# Patient Record
Sex: Female | Born: 1945 | Race: White | Hispanic: No | Marital: Married | Smoking: Former smoker
Health system: Southern US, Community
[De-identification: ages and names within clinical notes are randomized; demographics above are authoritative.]

## PROBLEM LIST (undated history)

## (undated) DIAGNOSIS — M069 Rheumatoid arthritis, unspecified: Secondary | ICD-10-CM

## (undated) DIAGNOSIS — M459 Ankylosing spondylitis of unspecified sites in spine: Secondary | ICD-10-CM

## (undated) DIAGNOSIS — K219 Gastro-esophageal reflux disease without esophagitis: Secondary | ICD-10-CM

## (undated) DIAGNOSIS — K5090 Crohn's disease, unspecified, without complications: Secondary | ICD-10-CM

## (undated) DIAGNOSIS — I6523 Occlusion and stenosis of bilateral carotid arteries: Secondary | ICD-10-CM

## (undated) DIAGNOSIS — R112 Nausea with vomiting, unspecified: Secondary | ICD-10-CM

## (undated) DIAGNOSIS — M1990 Unspecified osteoarthritis, unspecified site: Secondary | ICD-10-CM

## (undated) DIAGNOSIS — E039 Hypothyroidism, unspecified: Secondary | ICD-10-CM

## (undated) DIAGNOSIS — J189 Pneumonia, unspecified organism: Secondary | ICD-10-CM

## (undated) DIAGNOSIS — I2510 Atherosclerotic heart disease of native coronary artery without angina pectoris: Secondary | ICD-10-CM

## (undated) DIAGNOSIS — M359 Systemic involvement of connective tissue, unspecified: Secondary | ICD-10-CM

## (undated) DIAGNOSIS — E119 Type 2 diabetes mellitus without complications: Secondary | ICD-10-CM

## (undated) DIAGNOSIS — E785 Hyperlipidemia, unspecified: Secondary | ICD-10-CM

## (undated) DIAGNOSIS — D649 Anemia, unspecified: Secondary | ICD-10-CM

## (undated) DIAGNOSIS — E079 Disorder of thyroid, unspecified: Secondary | ICD-10-CM

## (undated) DIAGNOSIS — Z9889 Other specified postprocedural states: Secondary | ICD-10-CM

## (undated) HISTORY — DX: Atherosclerotic heart disease of native coronary artery without angina pectoris: I25.10

## (undated) HISTORY — PX: BREAST BIOPSY: SHX20

## (undated) HISTORY — PX: TONSILLECTOMY: SUR1361

## (undated) HISTORY — PX: BREAST EXCISIONAL BIOPSY: SUR124

## (undated) HISTORY — PX: JOINT REPLACEMENT: SHX530

## (undated) HISTORY — PX: APPENDECTOMY: SHX54

## (undated) HISTORY — DX: Hyperlipidemia, unspecified: E78.5

## (undated) HISTORY — DX: Disorder of thyroid, unspecified: E07.9

## (undated) HISTORY — DX: Crohn's disease, unspecified, without complications: K50.90

## (undated) HISTORY — DX: Unspecified osteoarthritis, unspecified site: M19.90

---

## 1985-02-19 HISTORY — PX: ABDOMINAL HYSTERECTOMY: SHX81

## 1990-02-19 HISTORY — PX: THYROID SURGERY: SHX805

## 2005-04-09 ENCOUNTER — Ambulatory Visit: Payer: Self-pay | Admitting: Gastroenterology

## 2005-05-14 ENCOUNTER — Ambulatory Visit: Payer: Self-pay | Admitting: Internal Medicine

## 2006-07-11 ENCOUNTER — Ambulatory Visit: Payer: Self-pay

## 2007-07-11 ENCOUNTER — Ambulatory Visit: Payer: Self-pay | Admitting: Otolaryngology

## 2007-07-14 ENCOUNTER — Ambulatory Visit: Payer: Self-pay | Admitting: Obstetrics and Gynecology

## 2007-09-10 ENCOUNTER — Ambulatory Visit: Payer: Self-pay | Admitting: Otolaryngology

## 2008-04-07 ENCOUNTER — Ambulatory Visit: Payer: Self-pay | Admitting: Gastroenterology

## 2008-07-26 ENCOUNTER — Ambulatory Visit: Payer: Self-pay | Admitting: Family Medicine

## 2008-09-13 ENCOUNTER — Emergency Department: Payer: Self-pay | Admitting: Emergency Medicine

## 2008-12-14 ENCOUNTER — Inpatient Hospital Stay: Payer: Self-pay | Admitting: Internal Medicine

## 2009-07-28 ENCOUNTER — Ambulatory Visit: Payer: Self-pay | Admitting: Family Medicine

## 2009-09-13 ENCOUNTER — Inpatient Hospital Stay: Payer: Self-pay | Admitting: Internal Medicine

## 2009-10-25 ENCOUNTER — Ambulatory Visit: Payer: Self-pay | Admitting: Otolaryngology

## 2010-02-19 HISTORY — PX: SMALL INTESTINE SURGERY: SHX150

## 2010-03-25 ENCOUNTER — Emergency Department: Payer: Self-pay | Admitting: Emergency Medicine

## 2010-03-26 ENCOUNTER — Emergency Department: Payer: Self-pay | Admitting: Emergency Medicine

## 2010-08-24 ENCOUNTER — Ambulatory Visit: Payer: Self-pay | Admitting: Internal Medicine

## 2010-11-20 ENCOUNTER — Ambulatory Visit: Payer: Self-pay | Admitting: Gastroenterology

## 2011-02-20 ENCOUNTER — Inpatient Hospital Stay: Payer: Self-pay | Admitting: Internal Medicine

## 2011-02-20 HISTORY — PX: COLONOSCOPY: SHX174

## 2011-02-20 LAB — CBC
HCT: 32.8 % — ABNORMAL LOW (ref 35.0–47.0)
MCH: 28.8 pg (ref 26.0–34.0)
MCH: 29.5 pg (ref 26.0–34.0)
MCHC: 33.8 g/dL (ref 32.0–36.0)
MCV: 86 fL (ref 80–100)
MCV: 87 fL (ref 80–100)
Platelet: 209 10*3/uL (ref 150–440)
Platelet: 167 10*3/uL (ref 150–440)
RDW: 14.6 % — ABNORMAL HIGH (ref 11.5–14.5)
WBC: 9.9 10*3/uL (ref 3.6–11.0)

## 2011-02-20 LAB — SEDIMENTATION RATE: Erythrocyte Sed Rate: 9 mm/hr (ref 0–30)

## 2011-02-20 LAB — COMPREHENSIVE METABOLIC PANEL
Albumin: 2.4 g/dL — ABNORMAL LOW (ref 3.4–5.0)
Alkaline Phosphatase: 63 U/L (ref 50–136)
BUN: 8 mg/dL (ref 7–18)
Bilirubin,Total: 0.3 mg/dL (ref 0.2–1.0)
Creatinine: 0.69 mg/dL (ref 0.60–1.30)
EGFR (Non-African Amer.): >60
Glucose: 131 mg/dL — ABNORMAL HIGH (ref 65–99)
Osmolality: 291 (ref 275–301)
SGPT (ALT): 22 U/L
Sodium: 146 mmol/L — ABNORMAL HIGH (ref 136–145)
Total Protein: 5.6 g/dL — ABNORMAL LOW (ref 6.4–8.2)

## 2011-02-20 LAB — HEMOGLOBIN: HGB: 10.5 g/dL — ABNORMAL LOW (ref 12.0–16.0)

## 2011-02-20 LAB — URINALYSIS, COMPLETE
Leukocyte Esterase: NEGATIVE
Nitrite: NEGATIVE
Ph: 5 (ref 4.5–8.0)
Protein: NEGATIVE
Specific Gravity: 1.008 (ref 1.003–1.030)

## 2011-02-20 LAB — BASIC METABOLIC PANEL
Anion Gap: 9 (ref 7–16)
Calcium, Total: 7.2 mg/dL — ABNORMAL LOW (ref 8.5–10.1)
Chloride: 114 mmol/L — ABNORMAL HIGH (ref 98–107)
Co2: 24 mmol/L (ref 21–32)
Creatinine: 0.65 mg/dL (ref 0.60–1.30)
EGFR (African American): >60
Glucose: 99 mg/dL (ref 65–99)
Potassium: 3.9 mmol/L (ref 3.5–5.1)

## 2011-02-20 LAB — TROPONIN I: Troponin-I: <0.02 ng/mL

## 2011-02-20 LAB — TSH: Thyroid Stimulating Horm: 9.03 u[IU]/mL — ABNORMAL HIGH

## 2011-02-21 LAB — BASIC METABOLIC PANEL
Calcium, Total: 6.9 mg/dL — CL (ref 8.5–10.1)
Chloride: 110 mmol/L — ABNORMAL HIGH (ref 98–107)
EGFR (African American): >60
EGFR (Non-African Amer.): >60
Glucose: 97 mg/dL (ref 65–99)
Potassium: 3.6 mmol/L (ref 3.5–5.1)
Sodium: 144 mmol/L (ref 136–145)

## 2011-02-21 LAB — HEMOGLOBIN: HGB: 9.3 g/dL — ABNORMAL LOW (ref 12.0–16.0)

## 2011-02-21 LAB — CBC WITH DIFFERENTIAL/PLATELET
Basophil #: 0 10*3/uL (ref 0.0–0.1)
Basophil %: 0.6 %
Eosinophil #: 0.1 10*3/uL (ref 0.0–0.7)
HGB: 8.9 g/dL — ABNORMAL LOW (ref 12.0–16.0)
Lymphocyte %: 24.7 %
MCH: 29 pg (ref 26.0–34.0)
MCHC: 33.4 g/dL (ref 32.0–36.0)
MCV: 87 fL (ref 80–100)
Monocyte #: 0.5 10*3/uL (ref 0.0–0.7)
Neutrophil %: 65.2 %
RDW: 14.9 % — ABNORMAL HIGH (ref 11.5–14.5)

## 2011-02-21 LAB — MAGNESIUM
Magnesium: 1.5 mg/dL — ABNORMAL LOW
Magnesium: 1.8 mg/dL

## 2011-03-06 ENCOUNTER — Observation Stay: Payer: Self-pay | Admitting: Internal Medicine

## 2011-03-06 LAB — URINALYSIS, COMPLETE
Bilirubin,UR: NEGATIVE
Blood: NEGATIVE
Leukocyte Esterase: NEGATIVE
Ph: 6 (ref 4.5–8.0)
Protein: NEGATIVE
RBC,UR: <1 /HPF (ref 0–5)
Squamous Epithelial: 2
WBC UR: 1 /HPF (ref 0–5)

## 2011-03-06 LAB — CBC
MCH: 29.3 pg (ref 26.0–34.0)
MCHC: 33.3 g/dL (ref 32.0–36.0)
MCV: 88 fL (ref 80–100)
Platelet: 278 10*3/uL (ref 150–440)
RBC: 3.87 10*6/uL (ref 3.80–5.20)
RDW: 15.4 % — ABNORMAL HIGH (ref 11.5–14.5)

## 2011-03-06 LAB — CK TOTAL AND CKMB (NOT AT ARMC): CK-MB: <0.5 ng/mL — ABNORMAL LOW (ref 0.5–3.6)

## 2011-03-06 LAB — COMPREHENSIVE METABOLIC PANEL
Alkaline Phosphatase: 86 U/L (ref 50–136)
BUN: 8 mg/dL (ref 7–18)
Calcium, Total: 8.3 mg/dL — ABNORMAL LOW (ref 8.5–10.1)
Chloride: 108 mmol/L — ABNORMAL HIGH (ref 98–107)
Co2: 26 mmol/L (ref 21–32)
Creatinine: 0.79 mg/dL (ref 0.60–1.30)
EGFR (African American): >60
EGFR (Non-African Amer.): >60
Sodium: 144 mmol/L (ref 136–145)
Total Protein: 6.8 g/dL (ref 6.4–8.2)

## 2011-03-06 LAB — TROPONIN I
Troponin-I: <0.02 ng/mL
Troponin-I: <0.02 ng/mL

## 2011-03-06 LAB — SEDIMENTATION RATE: Erythrocyte Sed Rate: 33 mm/hr — ABNORMAL HIGH (ref 0–30)

## 2011-03-06 LAB — TSH: Thyroid Stimulating Horm: 2.78 u[IU]/mL

## 2011-03-07 LAB — BASIC METABOLIC PANEL
Anion Gap: 10 (ref 7–16)
BUN: 6 mg/dL — ABNORMAL LOW (ref 7–18)
Co2: 24 mmol/L (ref 21–32)
Creatinine: 0.67 mg/dL (ref 0.60–1.30)
EGFR (African American): >60
EGFR (Non-African Amer.): >60

## 2011-03-07 LAB — CBC WITH DIFFERENTIAL/PLATELET
Basophil #: 0 10*3/uL (ref 0.0–0.1)
Basophil %: 0.5 %
Eosinophil #: 0.1 10*3/uL (ref 0.0–0.7)
HCT: 28.3 % — ABNORMAL LOW (ref 35.0–47.0)
HGB: 9.5 g/dL — ABNORMAL LOW (ref 12.0–16.0)
Lymphocyte #: 1.4 10*3/uL (ref 1.0–3.6)
Lymphocyte %: 23.4 %
MCHC: 33.5 g/dL (ref 32.0–36.0)
MCV: 88 fL (ref 80–100)
Monocyte #: 0.5 10*3/uL (ref 0.0–0.7)
Monocyte %: 8.7 %
Neutrophil #: 3.9 10*3/uL (ref 1.4–6.5)
RBC: 3.2 10*6/uL — ABNORMAL LOW (ref 3.80–5.20)
WBC: 5.9 10*3/uL (ref 3.6–11.0)

## 2011-03-09 ENCOUNTER — Ambulatory Visit: Payer: Self-pay | Admitting: General Surgery

## 2011-03-09 LAB — CBC WITH DIFFERENTIAL/PLATELET
Eosinophil #: 0.1 10*3/uL (ref 0.0–0.7)
Lymphocyte #: 1.8 10*3/uL (ref 1.0–3.6)
MCH: 29.4 pg (ref 26.0–34.0)
MCHC: 33.4 g/dL (ref 32.0–36.0)
Monocyte #: 0.5 10*3/uL (ref 0.0–0.7)
Monocyte %: 7.5 %
Neutrophil %: 64.7 %
Platelet: 220 10*3/uL (ref 150–440)
RBC: 3.65 10*6/uL — ABNORMAL LOW (ref 3.80–5.20)
RDW: 15 % — ABNORMAL HIGH (ref 11.5–14.5)
WBC: 6.7 10*3/uL (ref 3.6–11.0)

## 2011-03-09 LAB — SEDIMENTATION RATE: Erythrocyte Sed Rate: 34 mm/hr — ABNORMAL HIGH (ref 0–30)

## 2011-03-13 LAB — PATHOLOGY REPORT

## 2011-03-15 ENCOUNTER — Ambulatory Visit: Payer: Self-pay | Admitting: General Surgery

## 2011-03-26 ENCOUNTER — Ambulatory Visit: Payer: Self-pay | Admitting: General Surgery

## 2011-04-02 DIAGNOSIS — D649 Anemia, unspecified: Secondary | ICD-10-CM | POA: Insufficient documentation

## 2011-05-02 DIAGNOSIS — R350 Frequency of micturition: Secondary | ICD-10-CM | POA: Insufficient documentation

## 2011-05-02 DIAGNOSIS — Z7960 Long term (current) use of unspecified immunomodulators and immunosuppressants: Secondary | ICD-10-CM | POA: Insufficient documentation

## 2011-05-02 DIAGNOSIS — K6389 Other specified diseases of intestine: Secondary | ICD-10-CM | POA: Insufficient documentation

## 2011-06-01 ENCOUNTER — Ambulatory Visit: Payer: Self-pay | Admitting: General Surgery

## 2011-06-06 ENCOUNTER — Inpatient Hospital Stay: Payer: Self-pay | Admitting: General Surgery

## 2011-06-06 HISTORY — PX: COLON SURGERY: SHX602

## 2011-06-07 LAB — CREATININE, SERUM: Creatinine: 0.82 mg/dL (ref 0.60–1.30)

## 2011-06-08 LAB — BASIC METABOLIC PANEL
BUN: 4 mg/dL — ABNORMAL LOW (ref 7–18)
Calcium, Total: 7.2 mg/dL — ABNORMAL LOW (ref 8.5–10.1)
Chloride: 104 mmol/L (ref 98–107)
Co2: 29 mmol/L (ref 21–32)
EGFR (African American): >60
EGFR (Non-African Amer.): >60
Sodium: 141 mmol/L (ref 136–145)

## 2011-06-08 LAB — CBC WITH DIFFERENTIAL/PLATELET
Basophil #: 0 10*3/uL (ref 0.0–0.1)
Eosinophil #: 0.3 10*3/uL (ref 0.0–0.7)
Eosinophil %: 4.4 %
HCT: 27.4 % — ABNORMAL LOW (ref 35.0–47.0)
HGB: 9 g/dL — ABNORMAL LOW (ref 12.0–16.0)
Lymphocyte #: 1.5 10*3/uL (ref 1.0–3.6)
Lymphocyte %: 18.9 %
MCH: 27.5 pg (ref 26.0–34.0)
MCHC: 32.8 g/dL (ref 32.0–36.0)
MCV: 84 fL (ref 80–100)
Neutrophil #: 5.1 10*3/uL (ref 1.4–6.5)
Neutrophil %: 66.3 %
Platelet: 158 10*3/uL (ref 150–440)
WBC: 7.8 10*3/uL (ref 3.6–11.0)

## 2011-06-28 ENCOUNTER — Ambulatory Visit: Payer: Self-pay

## 2011-07-03 LAB — PATHOLOGY REPORT

## 2011-09-03 DIAGNOSIS — Z79899 Other long term (current) drug therapy: Secondary | ICD-10-CM | POA: Insufficient documentation

## 2011-09-19 DIAGNOSIS — L989 Disorder of the skin and subcutaneous tissue, unspecified: Secondary | ICD-10-CM | POA: Insufficient documentation

## 2011-10-17 ENCOUNTER — Ambulatory Visit: Payer: Self-pay | Admitting: Nurse Practitioner

## 2011-11-30 ENCOUNTER — Encounter: Payer: Self-pay | Admitting: Rheumatology

## 2011-12-21 ENCOUNTER — Encounter: Payer: Self-pay | Admitting: Rheumatology

## 2012-03-11 DIAGNOSIS — R002 Palpitations: Secondary | ICD-10-CM | POA: Insufficient documentation

## 2012-04-15 DIAGNOSIS — H18599 Other hereditary corneal dystrophies, unspecified eye: Secondary | ICD-10-CM | POA: Insufficient documentation

## 2013-03-16 ENCOUNTER — Ambulatory Visit: Payer: Self-pay | Admitting: Nurse Practitioner

## 2013-08-31 DIAGNOSIS — K9089 Other intestinal malabsorption: Secondary | ICD-10-CM | POA: Insufficient documentation

## 2013-08-31 DIAGNOSIS — E538 Deficiency of other specified B group vitamins: Secondary | ICD-10-CM | POA: Insufficient documentation

## 2013-12-02 ENCOUNTER — Encounter: Payer: Self-pay | Admitting: *Deleted

## 2013-12-10 ENCOUNTER — Ambulatory Visit: Payer: Self-pay | Admitting: Otolaryngology

## 2013-12-10 LAB — CREATININE, SERUM
Creatinine: 0.92 mg/dL (ref 0.60–1.30)
EGFR (Non-African Amer.): >60

## 2013-12-15 ENCOUNTER — Encounter: Payer: Self-pay | Admitting: General Surgery

## 2013-12-16 ENCOUNTER — Encounter: Payer: Self-pay | Admitting: General Surgery

## 2013-12-16 ENCOUNTER — Ambulatory Visit (INDEPENDENT_AMBULATORY_CARE_PROVIDER_SITE_OTHER): Payer: Federal, State, Local not specified - PPO | Admitting: General Surgery

## 2013-12-16 VITALS — BP 130/74 | HR 68 | Resp 14 | Ht 68.0 in | Wt 173.0 lb

## 2013-12-16 DIAGNOSIS — K439 Ventral hernia without obstruction or gangrene: Secondary | ICD-10-CM

## 2013-12-16 DIAGNOSIS — K5090 Crohn's disease, unspecified, without complications: Secondary | ICD-10-CM

## 2013-12-16 NOTE — Patient Instructions (Addendum)
Patient to have a abdominal cat scan. Follow up appointment to be announced.  This patient has been scheduled for a CT abdomen/pelvis with IV and oral contrast at [facility] for Monday, 12-21-13 at 10:30 am (arrive 10:15 am). Prep: no solids 4 hours prior but patient may have clear liquids up until exam time, pick up prep kit, and take medication list. Patient verbalizes understanding.

## 2013-12-16 NOTE — Progress Notes (Signed)
Patient ID: Unk Pinto, female   DOB: 06-14-1945, 68 y.o.   MRN: [id number]  Chief Complaint  Patient presents with  . Other    ventral hernia    HPI Joanna Ortiz is a 68 y.o. female here today for a evaluation of a possible ventral  hernia. She states she noticed it two months ago. Patient states she feel pressure but no pain.  She has been aware of a diffuse bulge in the upper abdomen with changes in position for the last 6-8 months.     HPI  Past Medical History  Diagnosis Date  . Crohn disease   . Thyroid disease     Past Surgical History  Procedure Laterality Date  . Abdominal hysterectomy  1987  . Thyroid surgery  1992  . Small intestine surgery  2012  . Colonoscopy  2013  . Colon surgery  06/06/2011    Resection of terminal ileum and previous ileocolonic anastomosis for recurrent Crohn's disease. 3 focal strictures identified.    No family history on file.  Social History History  Substance Use Topics  . Smoking status: Former Smoker -- 1.00 packs/day for 12 years    Types: Cigarettes  . Smokeless tobacco: Not on file  . Alcohol Use: No    Allergies  Allergen Reactions  . Cefuroxime Nausea And Vomiting  . Ciprocinonide [Fluocinolone]     Muscle pain   . Floxin [Ofloxacin] Nausea And Vomiting  . Keflex [Cephalexin] Nausea And Vomiting  . Levofloxacin Nausea And Vomiting  . Sulfa Antibiotics Nausea And Vomiting    Current Outpatient Prescriptions  Medication Sig Dispense Refill  . B Complex Vitamins (B-COMPLEX/B-12 PO) Take by mouth.      . Cinnamon 500 MG TABS Take by mouth daily.      . colestipol (COLESTID) 1 G tablet Take 1 g by mouth as needed.      Marland Kitchen HUMIRA PEN 40 MG/0.8ML PNKT       . Multiple Vitamin (MULTIVITAMIN) capsule Take by mouth.      . Omega-3 Fatty Acids (FISH OIL) 1000 MG CAPS Take by mouth daily.      Marland Kitchen thyroid (ARMOUR THYROID) 15 MG tablet Take 0.5 mg by mouth 4 (four) times daily.        No current  facility-administered medications for this visit.    Review of Systems Review of Systems  Constitutional: Negative.   Respiratory: Negative.   Cardiovascular: Negative.     Blood pressure 130/74, pulse 68, resp. rate 14, height $RemoveBe'5\' 8"'mREXqpHDm$  (1.727 m), weight 173 lb (78.472 kg). The patient's weight is up 10 pounds from her May 2013 exam. Physical Exam Physical Exam  Constitutional: She is oriented to person, place, and time. She appears well-developed and well-nourished.  Eyes: Conjunctivae are normal. No scleral icterus.  Neck: Neck supple.  Cardiovascular: Normal rate, regular rhythm and normal heart sounds.   Pulmonary/Chest: Effort normal and breath sounds normal.  Abdominal: Soft. Normal appearance and bowel sounds are normal. There is no hepatomegaly. There is tenderness in the epigastric area. Hernia:  4 inch above belly buttton midleine defect   Neurological: She is alert and oriented to person, place, and time.  Skin: Skin is warm and dry.    Data Reviewed Pathology from the 06/06/2011 intestinal resection showed terminal ileum and right colon with chronic active colitis with features consistent with Crohn's disease. Margins were clear.  Assessment    Possible ventral hernia.    Plan  Patient to have a CT scan based bar to evaluate the abdominal wall.   This patient has been scheduled for a CT abdomen/pelvis with IV and oral contrast at [facility] for Monday, 12-21-13 at 10:30 am (arrive 10:15 am). Prep: no solids 4 hours prior but patient may have clear liquids up until exam time, pick up prep kit, and take medication list. Patient verbalizes understanding.  PCP . Jason Fila 12/18/2013, 11:39 AM

## 2013-12-18 ENCOUNTER — Encounter: Payer: Self-pay | Admitting: General Surgery

## 2013-12-18 DIAGNOSIS — K5090 Crohn's disease, unspecified, without complications: Secondary | ICD-10-CM | POA: Insufficient documentation

## 2013-12-18 DIAGNOSIS — K439 Ventral hernia without obstruction or gangrene: Secondary | ICD-10-CM | POA: Insufficient documentation

## 2013-12-22 ENCOUNTER — Encounter: Payer: Self-pay | Admitting: General Surgery

## 2013-12-22 ENCOUNTER — Ambulatory Visit: Payer: Self-pay | Admitting: General Surgery

## 2013-12-24 ENCOUNTER — Telehealth: Payer: Self-pay | Admitting: *Deleted

## 2013-12-24 NOTE — Telephone Encounter (Signed)
Notified patient as instructed, patient pleased. Discussed follow-up appointments, patient agrees  

## 2013-12-24 NOTE — Telephone Encounter (Signed)
-----   Message from [name], MD sent at 12/24/2013  2:36 PM EST ----- Please notify the patient that NO hernias identified.  We may just be dealing with weakened tissue. No need for surgery at this time. Arrange f/u appt to review results. Thanks.

## 2013-12-29 ENCOUNTER — Encounter: Payer: Self-pay | Admitting: General Surgery

## 2013-12-29 ENCOUNTER — Ambulatory Visit (INDEPENDENT_AMBULATORY_CARE_PROVIDER_SITE_OTHER): Payer: Federal, State, Local not specified - PPO | Admitting: General Surgery

## 2013-12-29 VITALS — BP 140/78 | HR 72 | Resp 12 | Ht 68.0 in | Wt 175.0 lb

## 2013-12-29 DIAGNOSIS — R109 Unspecified abdominal pain: Secondary | ICD-10-CM

## 2013-12-29 NOTE — Progress Notes (Signed)
Patient ID: Joanna Ortiz, female   DOB: [birth year], 68 y.o.   MRN: [id number]  Chief Complaint  Patient presents with  . Follow-up    CT scan    HPI Joanna Ortiz is a 68 y.o. female.  Here today to review CT scan results done 12-22-13. She states her last episode of pain was a couple of days ago after her meal. She states it lasted 1-2 hours associated with diarrhea. Occurs every couple of days not associated with any particular food.  Plan for tonsillectomy 12-31-13 by Dr Kathyrn Sheriff.  HPI  Past Medical History  Diagnosis Date  . Crohn disease   . Thyroid disease     Past Surgical History  Procedure Laterality Date  . Abdominal hysterectomy  1987  . Thyroid surgery  1992  . Small intestine surgery  2012  . Colonoscopy  2013  . Colon surgery  06/06/2011    Resection of terminal ileum and previous ileocolonic anastomosis for recurrent Crohn's disease. 3 focal strictures identified.    No family history on file.  Social History History  Substance Use Topics  . Smoking status: Former Smoker -- 1.00 packs/day for 12 years    Types: Cigarettes  . Smokeless tobacco: Not on file  . Alcohol Use: No    Allergies  Allergen Reactions  . Cefuroxime Nausea And Vomiting  . Ciprocinonide [Fluocinolone]     Muscle pain   . Floxin [Ofloxacin] Nausea And Vomiting  . Keflex [Cephalexin] Nausea And Vomiting  . Levofloxacin Nausea And Vomiting  . Sulfa Antibiotics Nausea And Vomiting    Current Outpatient Prescriptions  Medication Sig Dispense Refill  . B Complex Vitamins (B-COMPLEX/B-12 PO) Take by mouth.    . colestipol (COLESTID) 1 G tablet Take 1 g by mouth as needed.    Marland Kitchen HUMIRA PEN 40 MG/0.8ML PNKT     . Multiple Vitamin (MULTIVITAMIN) capsule Take by mouth.    . thyroid (ARMOUR THYROID) 15 MG tablet Take 0.5 mg by mouth 4 (four) times daily.     . Cinnamon 500 MG TABS Take by mouth daily.    . Omega-3 Fatty Acids (FISH OIL) 1000 MG CAPS Take by mouth daily.     No  current facility-administered medications for this visit.    Review of Systems Review of Systems  Constitutional: Negative.   Respiratory: Negative.   Cardiovascular: Negative.   Gastrointestinal: Positive for abdominal pain and diarrhea. Negative for nausea and vomiting.       Loose bowel movements postprandial, unchanged from initial resection of the terminal ileum in the late 1990s.    Blood pressure 140/78, pulse 72, resp. rate 12, height 5\' 8"  (1.727 m), weight 175 lb (79.379 kg).  Physical Exam Physical Exam  Constitutional: She is oriented to person, place, and time. She appears well-developed and well-nourished.  Neck: Neck supple.  Abdominal: Soft. Normal appearance. There is no tenderness. No hernia.  Lymphadenopathy:    She has no cervical adenopathy.  Neurological: She is alert and oriented to person, place, and time.  Skin: Skin is warm and dry.    Data Reviewed CT scan of the abdomen and pelvis dated 12/22/2013 did not show evidence of a ventral hernia.  Assessment    Intermittent abdominal pain, likely secondary to extensive diverticulosis of the small bowel, in particular the duodenum. No evidence of obstruction on recent CT.     Plan    The gallstone identified on CT does not appear to be obstructing, and  there is no evidence of gallbladder wall thickening. Her symptoms are more suggestive of intermittent overdistention and emptying of the known proximal duodenal diverticuli, the largest previously measuring 5 cm in diameter. Surgical mention of these lesions would likely be fraught with danger. Her symptoms are occurring once or twice a week, and at this time she is not appreciated any particular dietary pattern. She's been encouraged to pay close attention to this in the event that certain foods are more troubling than others. Unless her symptoms worsen would advise against elective cholecystectomy at this time. No CT evidence of abdominal wall hernia present.  Observation is warranted.      Follow for now unless symptoms occur more frequent or changes. Consider logging a food diary.  PCP:  Thereasa Distance [facility]: Dr Boneta Lucks, [facility]  Joanna Ortiz 12/29/2013, 8:31 PM

## 2013-12-29 NOTE — Patient Instructions (Addendum)
The patient is aware to call back for any questions or concerns. Follow for now unless symptoms occur more frequent or changes. Consider logging a food diary.

## 2013-12-31 ENCOUNTER — Ambulatory Visit: Payer: Self-pay | Admitting: Otolaryngology

## 2014-01-25 ENCOUNTER — Ambulatory Visit: Payer: Self-pay | Admitting: Otolaryngology

## 2014-01-25 LAB — T4, FREE: FREE THYROXINE: 0.79 ng/dL (ref 0.76–1.46)

## 2014-01-25 LAB — TSH: THYROID STIMULATING HORM: 0.174 u[IU]/mL — AB

## 2014-04-02 DIAGNOSIS — E781 Pure hyperglyceridemia: Secondary | ICD-10-CM | POA: Insufficient documentation

## 2014-04-02 DIAGNOSIS — E039 Hypothyroidism, unspecified: Secondary | ICD-10-CM | POA: Insufficient documentation

## 2014-04-21 ENCOUNTER — Ambulatory Visit: Payer: Self-pay | Admitting: Otolaryngology

## 2014-06-04 ENCOUNTER — Inpatient Hospital Stay
Admit: 2014-06-04 | Disposition: A | Discharge status: Home / Self Care | Payer: Self-pay | Attending: Internal Medicine | Admitting: Internal Medicine

## 2014-06-04 LAB — CBC WITH DIFFERENTIAL/PLATELET
BASOS ABS: 0.1 10*3/uL (ref 0.0–0.1)
BASOS PCT: 0.5 %
Eosinophil #: 0.2 10*3/uL (ref 0.0–0.7)
Eosinophil %: 2.5 %
HCT: 37.8 % (ref 35.0–47.0)
HGB: 12.7 g/dL (ref 12.0–16.0)
LYMPHS ABS: 3.1 10*3/uL (ref 1.0–3.6)
Lymphocyte %: 31.8 %
MCH: 29.6 pg (ref 26.0–34.0)
MCHC: 33.6 g/dL (ref 32.0–36.0)
MCV: 88 fL (ref 80–100)
Monocyte #: 0.6 x10 3/mm (ref 0.2–0.9)
Monocyte %: 6.4 %
NEUTROS ABS: 5.6 10*3/uL (ref 1.4–6.5)
Neutrophil %: 58.8 %
Platelet: 213 10*3/uL (ref 150–440)
RBC: 4.29 10*6/uL (ref 3.80–5.20)
RDW: 14.2 % (ref 11.5–14.5)
WBC: 9.6 10*3/uL (ref 3.6–11.0)

## 2014-06-04 LAB — COMPREHENSIVE METABOLIC PANEL
ANION GAP: 9 (ref 7–16)
AST: 24 U/L
Albumin: 3.8 g/dL
Alkaline Phosphatase: 91 U/L
BUN: 10 mg/dL
Bilirubin,Total: 0.5 mg/dL
CO2: 26 mmol/L
Calcium, Total: 9 mg/dL
Chloride: 107 mmol/L
Creatinine: 0.77 mg/dL
EGFR (Non-African Amer.): >60
GLUCOSE: 205 mg/dL — AB
Potassium: 3.8 mmol/L
SGPT (ALT): 31 U/L
SODIUM: 142 mmol/L
Total Protein: 7.3 g/dL

## 2014-06-05 LAB — CBC WITH DIFFERENTIAL/PLATELET
BASOS PCT: 0.4 %
Basophil #: 0 10*3/uL (ref 0.0–0.1)
Eosinophil #: 0.3 10*3/uL (ref 0.0–0.7)
Eosinophil %: 2.9 %
HCT: 33.8 % — ABNORMAL LOW (ref 35.0–47.0)
HGB: 11.5 g/dL — AB (ref 12.0–16.0)
LYMPHS PCT: 22.6 %
Lymphocyte #: 2 10*3/uL (ref 1.0–3.6)
MCH: 30.1 pg (ref 26.0–34.0)
MCHC: 34.1 g/dL (ref 32.0–36.0)
MCV: 88 fL (ref 80–100)
Monocyte #: 0.7 x10 3/mm (ref 0.2–0.9)
Monocyte %: 8 %
NEUTROS PCT: 66.1 %
Neutrophil #: 5.9 10*3/uL (ref 1.4–6.5)
PLATELETS: 180 10*3/uL (ref 150–440)
RBC: 3.83 10*6/uL (ref 3.80–5.20)
RDW: 14.1 % (ref 11.5–14.5)
WBC: 9 10*3/uL (ref 3.6–11.0)

## 2014-06-05 LAB — MAGNESIUM: MAGNESIUM: 1.4 mg/dL — AB

## 2014-06-05 LAB — BASIC METABOLIC PANEL
Anion Gap: 6 — ABNORMAL LOW (ref 7–16)
BUN: 10 mg/dL
CALCIUM: 8.2 mg/dL — AB
CO2: 25 mmol/L
CREATININE: 0.8 mg/dL
Chloride: 111 mmol/L
EGFR (African American): >60
Glucose: 123 mg/dL — ABNORMAL HIGH
Potassium: 3.3 mmol/L — ABNORMAL LOW
Sodium: 142 mmol/L

## 2014-06-05 LAB — HEMOGLOBIN: HGB: 12.2 g/dL (ref 12.0–16.0)

## 2014-06-06 LAB — BASIC METABOLIC PANEL
ANION GAP: 5 — AB (ref 7–16)
BUN: 7 mg/dL
CALCIUM: 7.6 mg/dL — AB
Chloride: 111 mmol/L
Co2: 26 mmol/L
Creatinine: 0.65 mg/dL
EGFR (African American): >60
EGFR (Non-African Amer.): >60
Glucose: 116 mg/dL — ABNORMAL HIGH
POTASSIUM: 3.5 mmol/L
SODIUM: 142 mmol/L

## 2014-06-06 LAB — CBC WITH DIFFERENTIAL/PLATELET
Basophil #: 0 10*3/uL (ref 0.0–0.1)
Basophil %: 0.3 %
EOS PCT: 1.3 %
Eosinophil #: 0.1 10*3/uL (ref 0.0–0.7)
HCT: 32.5 % — AB (ref 35.0–47.0)
HGB: 10.8 g/dL — AB (ref 12.0–16.0)
LYMPHS ABS: 1.9 10*3/uL (ref 1.0–3.6)
Lymphocyte %: 21.3 %
MCH: 29.6 pg (ref 26.0–34.0)
MCHC: 33.3 g/dL (ref 32.0–36.0)
MCV: 89 fL (ref 80–100)
MONOS PCT: 7.6 %
Monocyte #: 0.7 x10 3/mm (ref 0.2–0.9)
Neutrophil #: 6.2 10*3/uL (ref 1.4–6.5)
Neutrophil %: 69.5 %
Platelet: 154 10*3/uL (ref 150–440)
RBC: 3.67 10*6/uL — ABNORMAL LOW (ref 3.80–5.20)
RDW: 13.8 % (ref 11.5–14.5)
WBC: 8.9 10*3/uL (ref 3.6–11.0)

## 2014-06-07 ENCOUNTER — Telehealth: Payer: Self-pay | Admitting: *Deleted

## 2014-06-07 ENCOUNTER — Encounter: Payer: Self-pay | Admitting: General Surgery

## 2014-06-07 NOTE — Telephone Encounter (Signed)
-----   Message from [name], MD sent at 06/06/2014  9:49 AM EDT ----- Patient admitted Friday w/ GI bleed, d/c'd home on Sunday, April 17. Arrange for OV on Thursday,  and post for colonoscopy on Tuesday, April 26th. Thanks.

## 2014-06-07 NOTE — Telephone Encounter (Signed)
Appointment and colonoscopy scheduled accordingly. Patient aware.   Will review colonoscopy prep instructions at time of appointment on Thursday, June 10, 2014.

## 2014-06-10 ENCOUNTER — Ambulatory Visit (INDEPENDENT_AMBULATORY_CARE_PROVIDER_SITE_OTHER): Payer: Federal, State, Local not specified - PPO | Admitting: General Surgery

## 2014-06-10 ENCOUNTER — Other Ambulatory Visit: Payer: Self-pay | Admitting: General Surgery

## 2014-06-10 ENCOUNTER — Encounter: Payer: Self-pay | Admitting: General Surgery

## 2014-06-10 VITALS — BP 132/68 | HR 74 | Resp 14 | Ht 68.0 in | Wt 181.0 lb

## 2014-06-10 DIAGNOSIS — R1310 Dysphagia, unspecified: Secondary | ICD-10-CM

## 2014-06-10 DIAGNOSIS — K625 Hemorrhage of anus and rectum: Secondary | ICD-10-CM | POA: Diagnosis not present

## 2014-06-10 MED ORDER — POLYETHYLENE GLYCOL 3350 17 GM/SCOOP PO POWD: ORAL | Status: DC

## 2014-06-10 NOTE — Patient Instructions (Addendum)
Colonoscopy A colonoscopy is an exam to look at the entire large intestine (colon). This exam can help find problems such as tumors, polyps, inflammation, and areas of bleeding. The exam takes about 1 hour.  LET [facility] CARE PROVIDER KNOW ABOUT:   Any allergies you have.  All medicines you are taking, including vitamins, herbs, eye drops, creams, and over-the-counter medicines.  Previous problems you or members of your family have had with the use of anesthetics.  Any blood disorders you have.  Previous surgeries you have had.  Medical conditions you have. RISKS AND COMPLICATIONS  Generally, this is a safe procedure. However, as with any procedure, complications can occur. Possible complications include:  Bleeding.  Tearing or rupture of the colon wall.  Reaction to medicines given during the exam.  Infection (rare). BEFORE THE PROCEDURE   Ask your health care provider about changing or stopping your regular medicines.  You may be prescribed an oral bowel prep. This involves drinking a large amount of medicated liquid, starting the day before your procedure. The liquid will cause you to have multiple loose stools until your stool is almost clear or light green. This cleans out your colon in preparation for the procedure.  Do not eat or drink anything else once you have started the bowel prep, unless your health care provider tells you it is safe to do so.  Arrange for someone to drive you home after the procedure. PROCEDURE   You will be given medicine to help you relax (sedative).  You will lie on your side with your knees bent.  A long, flexible tube with a light and camera on the end (colonoscope) will be inserted through the rectum and into the colon. The camera sends video back to a computer screen as it moves through the colon. The colonoscope also releases carbon dioxide gas to inflate the colon. This helps your health care provider see the area better.  During  the exam, your health care provider may take a small tissue sample (biopsy) to be examined under a microscope if any abnormalities are found.  The exam is finished when the entire colon has been viewed. AFTER THE PROCEDURE   Do not drive for 24 hours after the exam.  You may have a small amount of blood in your stool.  You may pass moderate amounts of gas and have mild abdominal cramping or bloating. This is caused by the gas used to inflate your colon during the exam.  Ask when your test results will be ready and how you will get your results. Make sure you get your test results. Document Released: 02/03/2000 Document Revised: 11/26/2012 Document Reviewed: 10/13/2012 [facility] Patient Information 2015 [place], [place]. This information is not intended to replace advice given to you by your health care provider. Make sure you discuss any questions you have with your health care provider.  Patient has been scheduled for an upper endoscopy and colonoscopy on 06-15-14 at [facility].

## 2014-06-10 NOTE — Progress Notes (Signed)
Patient ID: Unk Pinto, female   DOB: [birth year], 69 y.o.   MRN: [id number]  Chief Complaint  Patient presents with  . Pre-op Exam    colonoscopy    HPI Joanna Ortiz is a 69 y.o. female here today in follow-up of her recent hospitalization April 15-17, 2016 with the passage of blood per rectum. Patient was seen in the ER on 06/04/14 with a GI bleed. Her hemoglobin on admission was 12.7 with an MCV of 88, falling to 10.8 prior to discharge. White blood cell count was normal as was her platelet count. The bleeding was similar but less severe than she appreciated in 2013 when she had a similar episode. Colonoscopy at that time was negative. She was found to have 2 new areas of Crohn's stricture with a subsequent resection of 45 cm of the distal small bowel Henry anastomosis. The patient reports 7-8 loose stools per day, no significant improvement after the initiation of Humira in 2013. The patient reports that when she traveled home to [place] last year she was off of her medication for a month (usually taken twice per month)  with no change in her symptoms or stool frequency.    She states she has been having trouble swallowing over a year now. This is most notable with meats and carrots. She does not regurgitate food, but does have to wait until it passes. The sensation where the food appears to "stick" is at the bottom of the sternum. She had a similar episode in 2004 that responded well to dilatation completed by Gaylyn Cheers, M.D.      HPI  Past Medical History  Diagnosis Date  . Crohn disease   . Thyroid disease     Past Surgical History  Procedure Laterality Date  . Abdominal hysterectomy  1987  . Thyroid surgery  1992  . Small intestine surgery  2012  . Colonoscopy  2013  . Colon surgery  06/06/2011    Resection of terminal ileum and previous ileocolonic anastomosis for recurrent Crohn's disease. 3 focal strictures identified.    No family history on  file.  Social History History  Substance Use Topics  . Smoking status: Former Smoker -- 1.00 packs/day for 12 years    Types: Cigarettes  . Smokeless tobacco: Not on file  . Alcohol Use: No    Allergies  Allergen Reactions  . Cefuroxime Nausea And Vomiting  . Ciprocinonide [Fluocinolone]     Muscle pain   . Floxin [Ofloxacin] Nausea And Vomiting  . Keflex [Cephalexin] Nausea And Vomiting  . Levofloxacin Nausea And Vomiting  . Sulfa Antibiotics Nausea And Vomiting    Current Outpatient Prescriptions  Medication Sig Dispense Refill  . B Complex Vitamins (B-COMPLEX/B-12 PO) Take by mouth.    . colestipol (COLESTID) 1 G tablet Take 1 g by mouth as needed.    Marland Kitchen HUMIRA PEN 40 MG/0.8ML PNKT     . Multiple Vitamin (MULTIVITAMIN) capsule Take by mouth.    . Omega-3 Fatty Acids (FISH OIL) 1000 MG CAPS Take by mouth daily.    Marland Kitchen SYNTHROID 100 MCG tablet Take 100 mcg by mouth daily before breakfast.   11  . thyroid (ARMOUR THYROID) 15 MG tablet Take 0.5 mg by mouth 4 (four) times daily.     . polyethylene glycol powder (GLYCOLAX/MIRALAX) powder 255 grams one bottle for colonoscopy prep 255 g 0   No current facility-administered medications for this visit.    Review of Systems Review of Systems  Constitutional: Negative.   Respiratory: Negative.   Cardiovascular: Negative.   Gastrointestinal: Positive for nausea and diarrhea. Negative for vomiting, abdominal pain, constipation, blood in stool, abdominal distention, anal bleeding and rectal pain.    Blood pressure 132/68, pulse 74, resp. rate 14, height 5\' 8"  (1.727 m), weight 181 lb (82.101 kg).  The patient's weight is up 8 pounds from her October 2015 exam.  Physical Exam Physical Exam  Constitutional: She is oriented to person, place, and time. She appears well-developed and well-nourished.  Eyes: Conjunctivae are normal. No scleral icterus.  Neck: Neck supple.  Cardiovascular: Normal rate, regular rhythm and normal heart  sounds.   Pulmonary/Chest: Effort normal and breath sounds normal.  Abdominal: Soft. Normal appearance and bowel sounds are normal. There is no hepatomegaly. There is no tenderness. No hernia.  Lymphadenopathy:    She has no cervical adenopathy.  Neurological: She is alert and oriented to person, place, and time.  Skin: Skin is warm and dry.    Data Reviewed   CT scan of the abdomen and pelvis dated 12/22/2013 did not show evidence of a ventral hernia.  Pathology of the 06/06/2011 resection of the terminal ileum and proximal right colon showed active colitis, transmural inflammation and small granuloma. Margins were clear. 5 benign lymph nodes.  Assessment    Recent GI bleed, likely diverticular source relevant Crohn's disease.  Marginal benefit from Humira.    Plan      Patient is scheduled for a colonoscopy and upper endoscopy on 06/15/14/  Colonoscopy with possible biopsy/polypectomy prn: Information regarding the procedure, including its potential risks and complications (including but not limited to perforation of the bowel, which may require emergency surgery to repair, and bleeding) was verbally given to the patient. Educational information regarding lower instestinal endoscopy was given to the patient. Written instructions for how to complete the bowel prep using Miralax were provided. The importance of drinking ample fluids to avoid dehydration as a result of the prep emphasized.  The risks associated with dilatation including possible rupture of the esophagus were reviewed with the patient and her daughter, Venetia Night.  The patient has discussed the lack of improvement in stool frequency with the GI department at [facility] in the past. She has found it beneficial to make use of cholestyramine, but found the capsules too large. She was instructed that these may be crushed, or we can change her to cholestyramine powder.  Patient has been scheduled for an upper endoscopy and  colonoscopy on 06-15-14 at [facility].    PCP: Thereasa Distance [facility]: Dr Boneta Lucks, [facility]  [name] 06/10/2014, 9:00 PM

## 2014-06-13 NOTE — H&P (Signed)
PATIENT NAME:  Joanna Ortiz, Joanna Ortiz MR#:  [id number] DATE OF BIRTH:  [birth year]  DATE OF ADMISSION:  02/20/2011  PRIMARY CARE PHYSICIAN: Dr. Ellison Hughs at [place]: Bright red blood per rectum.   HISTORY OF PRESENT ILLNESS: This is a 69 year old female who has a history of Crohn's disease. Last few days she has been experiencing some cramping abdominal pain. She has had some diarrhea with bits of blood in it. No nausea and vomiting. Last night she started passing copious amounts of blood. This morning she passed out in the bathroom. Her husband said there was a small puddle of blood by her and she kept passing blood. He called EMT. She came to but her blood pressure was low. Here in the ER she was hypotensive with blood pressure in the low 90s. Hemoglobin had fallen since previous readings. She was started on blood transfusion. She is able to answer questions but appears acutely ill and weak.   PAST MEDICAL HISTORY:  1. Crohn's disease.  2. Osteoarthritis. a. Cervical disk disease. b. Thoracic degenerative disk disease.  3. Ankylosing spondylitis.  4. Hypothyroidism.  5. Pernicious anemia.  6. Hypertriglyceridemia.  7. Restless leg syndrome.  8. Hypocalcemia.  9. Hypomagnesemia.  10. Lymphedema.   PAST SURGICAL HISTORY:  1. Thyroidectomy. 2. Hysterectomy.  3. Bowel obstruction.  4. Breast excision.   ALLERGIES: Cipro, Entocort and sulfa.   CURRENT MEDICATIONS:  1. HUMARA every two weeks. 2. Ibuprofen 400 mg at bedtime p.r.n.  3. Budesonide 3 mg daily.  4. Armour thyroid 30 mg 2 tabs b.i.d.  5. Cholestyramine 4 grams daily.  6. Tramadol 50 mg every six hours p.r.n.  7. Magnesium oxide 400 mg b.i.d.  8. Prilosec 40 mg daily.  9. Percocet 5/325 q.4 hours p.r.n.   SOCIAL HISTORY: Does not smoke, does not drink alcohol.   FAMILY HISTORY: Significant for coronary artery disease and breast cancer.   REVIEW OF SYSTEMS: CONSTITUTIONAL: No fever or chills.  EYES: No blurred vision. ENT: No hearing loss. CARDIOVASCULAR: No chest pain. PULMONARY: No shortness of breath. GASTROINTESTINAL: She has had diarrhea and bright red blood per rectum. GENITOURINARY: No dysuria. ENDOCRINE: No heat or cold intolerance. INTEGUMENT: No rash. MUSCULOSKELETAL: Occasional joint pain. NEUROLOGIC: No numbness or weakness.   PHYSICAL EXAMINATION:  VITAL SIGNS: Temperature 96.5, pulse 68, respirations 16, blood pressure 99/44.   GENERAL: This is a well-nourished white female who looks acutely ill.   HEENT: Pupils are equal, round, reactive to light. Sclerae not icteric. Oral mucosa is moist. Oropharynx is clear. Nasopharynx is clear.   NECK: Supple. No JVD, lymphadenopathy or thyromegaly.   CARDIOVASCULAR: Regular rate and rhythm. There is a 1/6 systolic murmur.   LUNGS: Clear to auscultation. No dullness to percussion. She is not using accessory muscles.   ABDOMEN: Soft, nondistended. Bowel sounds are positive. No hepatosplenomegaly. There is some mild diffuse tenderness. No rebound or guarding.   EXTREMITIES: There is 1+ lower extremity edema.   NEUROLOGIC: Cranial nerves II through XII are intact. She is alert and oriented x4.   SKIN: Moist with no rash.   LABORATORY, DIAGNOSTIC AND RADIOLOGICAL DATA: BUN 8, creatinine 0.69, sodium 146, potassium 3.1, hemoglobin 9.9, magnesium 1.0, TSH 9.03.   ASSESSMENT AND PLAN:  1. Acute gastrointestinal bleeding. I suspect lower GI, possibly a diverticular bleed or it could be from her Crohn's disease. She is currently going to undergo an emergent bleeding scan. Will go ahead and consult gastroenterology. Will go ahead and  transfuse blood since she had unstable blood pressure and had syncope.  2. Acute blood loss anemia. Again, will go ahead and transfuse her since her vital signs were unstable. She is at high risk for deterioration if we do not transfuse.  3. Syncope. This is likely from the hypotension from acute blood  loss. Will monitor.  4. Hypotension. Blood pressure has come up some with transfusion of blood but will monitor this closely.   TOTAL CRITICAL CARE TIME SPENT: 60 minutes.  ____________________________ Baxter Hire, MD jdj:cms D: 02/20/2011 08:54:01 ET T: 02/21/2011 06:02:25 ET JOB#: [id number]  cc: Baxter Hire, MD, <Dictator> Sofie Hartigan, MD Baxter Hire MD ELECTRONICALLY SIGNED 02/23/2011 10:11

## 2014-06-13 NOTE — Discharge Summary (Signed)
PATIENT NAME:  Joanna Ortiz, Joanna Ortiz MR#:  [id number] DATE OF BIRTH:  Oct 01, 1945  DATE OF ADMISSION:  03/06/2011 DATE OF DISCHARGE:  03/07/2011  PRIMARY CARE PHYSICIAN: Dr. Ellison Hughs    REASON FOR ADMISSION: Generalized weakness and dizziness.   DISCHARGE DIAGNOSES:  1. Generalized weakness secondary to orthostatic hypotension.  2. Orthostatic hypotension likely from volume depletion caused by diarrhea due to Crohn's disease.  3. History of Crohn's disease status post right-sided partial colectomy and distal small bowel resection now with ongoing diarrhea.   4. History of rheumatoid arthritis.  5. History of hypothyroidism.  6. History of chronic/pernicious anemia.  7. History of recent admission with lower gastrointestinal bleed due to Crohn's disease flare and anemia requiring blood transfusion.  8. History of gastroesophageal reflux disease.   CONSULTATIONS: None.   LABORATORY, DIAGNOSTIC AND RADIOLOGICAL DATA: Chest x-ray PA and lateral on admission: Mild opacities right medial lower lung likely secondary to atelectasis.   Renal function normal on admission and discharge. LFTs normal on admission except for serum albumin low at 2.7.   Cardiac enzymes are negative x2 sets.   TSH 2.78. CBC normal on admission except for hemoglobin 11.3, hematocrit 34; hemoglobin 9.5, hematocrit 28.3 at the time of discharge, felt to be dilutional component.   Stool for occult blood was negative from 03/07/2011.   BRIEF HISTORY/HOSPITAL COURSE: Patient is a pleasant 69 year old female with past medical history of Crohn's disease status post right-sided partial colectomy, distal small bowel resection with recent admission for lower GI bleed secondary to Crohn's disease flare and anemia requiring blood transfusion, history of rheumatoid arthritis, hypothyroidism, chronic/pernicious anemia and gastroesophageal reflux disease who presented to the Emergency Department with complaints of generalized weakness and  also had some dizziness and heart palpitations and elevated heart rate when standing. Please see dictated admission history and physical for pertinent details surrounding the onset of his hospitalization. Please see below for further details. 1. Generalized weakness-Felt to be due to orthostasis with elevated heart rate when going from supine to standing and also a few episodes of hypotension in the hospital. She was felt to have generalized weakness and dizziness as well as elevated heart rate with standing and heart palpitations due to orthostatic hypotension. She was felt to have volume depletion due to diarrhea caused by Crohn's disease. Her symptoms have resolved with IV fluids and hydration yet she continues to have some diarrhea which has been chronic of recent due to her Crohn's disease. After IV fluids and rehydration patient was no longer orthostatic and her blood pressure had normalized. She had no postural tachycardia noted. There are no obvious signs for volume loss other than her diarrhea from her Crohn's disease. Hemoglobin did improve from recent admission and her hemoglobin is 8.7 from recent hospital discharge post transfusion and hemoglobin was 11.3 on admission. Her hemoglobin has dropped some during this hospitalization which was felt to be dilutional. Stool was negative for occult blood and there was no obvious bleeding noted. Patient is currently not any blood pressure medications. For diarrhea due to Crohn's disease she will follow up with GI within one week as an outpatient. If orthostasis recurs she may need agent such as Florinef versus midodrine but this will be deferred to her primary care physician. She was also started on a salt liberal diet. She was seen by physical therapy prior to discharge and recommendation made for home with home health which has been arranged for this patient.  2. Crohn's disease-Patient continued budesonide taper as  advised by GI last visit and she will  follow up with Dr. Dionne Milo in the office within one week due to persistent/intermittent diarrhea and she is also on Humira which she takes for her rheumatoid arthritis as well and stool was negative for occult blood during this admission. 3. Rheumatoid arthritis-Patient to continue Humira and she will follow up with Dr. Jefm Bryant in the office.  4. Hypothyroidism-Patient to continue Synthroid. TSH was within normal limits.  5. Chronic/pernicious anemia-As above hemoglobin and hematocrit have improved from recent admission when she required blood transfusion and there are currently no indications to transfuse her blood during this hospitalization and stool was negative for occult blood and she will need close monitoring as an outpatient of her hemoglobin and hematocrit as well as continuation of B12 therapy.  6. On 03/07/2011 patient was hemodynamically stable and without any weakness, dizziness or heart palpitations and she was no longer orthostatic or hypotensive and she was felt to be stable for discharge home with home health with close outpatient follow up to which patient was agreeable.   DISCHARGE DISPOSITION: Home with home health.   DISCHARGE CONDITION: Improved, stable.   DISCHARGE ACTIVITY: As tolerated.   DISCHARGE DIET: Regular.   DISCHARGE MEDICATIONS:  1. Humira 40 mg/0.8 mL subcutaneous take 40 mg subcutaneous twice a month.  2. Dicyclomine 10 mg p.o. t.i.d. p.r.n.  3. Oxycodone acetaminophen 5/325 mg 2 tablets p.o. 4 to 6 times a day as needed for pain.  4. Budesonide extended release 3 mg 3 caplets p.o. daily.  5. Multivitamin 1 tablet daily.  6. Armour Thyroid 30 mg 2 tablets p.o. b.i.d.   DISCHARGE INSTRUCTIONS:  1. Take medications as prescribed.  2. Return to the Emergency Department for recurrence of symptoms or bloody diarrhea.   FOLLOW UP INSTRUCTIONS:  1. Follow up with Dr. Ellison Hughs within 1 to 2 weeks. Patient needs repeat blood pressure check within one week.    2. Follow up with Dr. Dionne Milo within 2 to 3 weeks.  3. Follow up with Dr. Jefm Bryant within 2 to 3 weeks.  4. Follow up with Dr. Bary Castilla within 1 to 2 weeks.   TIME SPENT ON DISCHARGE: Greater than 30 minutes.  ____________________________ Romie Jumper, MD knl:cms D: 03/10/2011 16:59:54 ET T: 03/11/2011 10:25:07 ET  JOB#: [id number] cc: Sofie Hartigan, MD Jill Side, MD Romie Jumper MD ELECTRONICALLY SIGNED 03/20/2011 18:46

## 2014-06-13 NOTE — Consult Note (Signed)
PATIENT NAME:  Joanna Ortiz, Joanna Ortiz MR#:  [id number] DATE OF BIRTH:  08-Nov-1945  DATE OF CONSULTATION:  02/20/2011  REFERRING PHYSICIAN:  Harrel Lemon, MD CONSULTING PHYSICIAN:  Lollie Sails, MD  REASON FOR CONSULTATION: Rectal bleeding.   HISTORY OF PRESENT ILLNESS: [name] is a 69 year old Caucasian female who came to the Emergency Room late last night with complaint of rectal bleeding. She states that she has a history of Crohn's disease. Indeed, she has had surgery for this in the past. She has been recently been under the care of Dr. Dionne Milo. She does take Humira on an every- other- week basis for this. She had actually taken Humira in the past for arthritic complaint. Just recently the abdominal pain seemed to be  increasing and she was placed on a supplemental drugs to the Humira for her Crohn's disease, budesonide. She was on a three-month taper and was on a dose of  3 mg a day until she began to have increased symptoms and increased it back to 6 mg a day herself about a week or two weeks ago. She has also been on dicyclomine. The pain is central and radiates toward the right lower quadrant and then toward the back. She states that her abdominal pain seems to be less well controlled, particularly in the past couple of weeks. She began to experience some hematochezia last night of some dark reddish material. Her last bowel movement was also bloody effluent, this being at about 3 to 4:00 this morning. She has had no recurrent hematochezia since that time, now 3:00 p.m.  She has been having a problem with nausea over the period of the past six months but no vomiting. She has had problems with bowel movements, about 10 to 15 times a day. This actually predates her Crohn's surgery of several years ago. Her bowel movements generally occur after meals very quickly and also occur at night. Her most recent colonoscopy was by Dr. Dionne Milo about two months ago. I am unsure of those results as they were  not done at [facility]. Prior to that she had a colonoscopy in February 2010 showing an ileocolonic anastomosis in the right side of the colon. The colon otherwise appeared normal. She also had an EGD on that date for problems of dysphagia and had an esophageal stenosis/stricture dilated at that time. Currently she denies any problems with heartburn or dysphagia. She does, again, have a history of Crohn's disease with resection of partial right colon as well as the terminal ileum. She does have a history of diverticulosis as well. She has a history of stomach ulcers over 20 years ago and takes Prilosec as an outpatient, but I believe she takes this rather irregularly. Currently she is hemodynamically stable after transfusion.   GI FAMILY HISTORY: Negative for colorectal cancer, liver disease, or ulcers. However, her mother had some sort of gastrointestinal disease with abdominal pain and loose stools long term, possibly also Crohn's. The patient is unaware.   PAST MEDICAL HISTORY:  1. History of thyroidectomy about 20 years ago and she is on a supplement currently.  2. Urosepsis admission last year.  3. She has a history of hysterectomy including bilateral salpingo-oophorectomy.  4. She had a right hemicolectomy as noted above.  5. She denies any problems with high blood pressure, diabetes, liver or kidney problems, or hepatitis.  6. She does have a history of arthritic problems.    SOCIAL HISTORY: She does not use any tobacco products. She does drink  about one-half glass of wine a year.   CURRENT MEDICATIONS:  1. Budesonide 3 mg, 2 capsules daily.  2. Synthroid 50 mcg a day.  She has in the past been on Cleocin, doxycycline, metronidazole, omeprazole, oxycodone, prednisone, promethazine, tramadol, and zolpidem.   ALLERGIES: She is allergic to codeine, Dilaudid,  Keflex, sulfa, latex.   REVIEW OF SYSTEMS: Per admission History and Physical.   PHYSICAL EXAMINATION:  VITAL SIGNS: Temperature 97.8,  pulse 72, respirations 20, blood pressure 137/87, pulse oximetry 99%.   GENERAL: She is a 69 year old Caucasian female in no acute distress, appears comfortable currently.   HEAD: Normocephalic, atraumatic.   EYES: Anicteric.   NOSE: Septum midline.   OROPHARYNX: No lesions.   NECK: Supple. No JVD. No lymphadenopathy.   HEART: Regular rate and rhythm without rub or gallop.   LUNGS: Bilaterally clear.   ABDOMEN: Soft. There is some mild discomfort to palpation in the right lower quadrant extending toward the umbilicus. There are no masses, rebound, or organomegaly. Bowel sounds positive, normoactive.   RECTAL: Anorectal examination shows a rectal stenosis with some external skin tags. Only the tip of the finger could be introduced into the anal canal. This did show an old maroon-ish effluent that was relatively drier.   EXTREMITIES: No clubbing, cyanosis, or edema. It is of note that she has lymphedema bilaterally of the upper extremities, more so on the right. She is uncertain of the etiology.   NEUROLOGICAL: Cranial nerves II through XII grossly intact. Muscle strength bilaterally equal and symmetric, 5/5. Deep tendon reflexes bilaterally equal and symmetric.     ASSESSMENT:  Lower GI bleed in the setting of previous history of Crohn's disease. She has had a recent colonoscopy by her primary gastroenterologist, but I am uncertain of those results as is the patient. She has apparently been having increased GI symptoms over the period of the past couple of months necessitating the addition of budesonide to her regimen of Humira. Broad differential diagnosis. On her presentation this seems to be more consistent with a diverticular bleed due to the acute onset or perhaps anal outlet bleed. She did have a reduced hemoglobin on admission but it was over 9. Other differential diagnostic aspects would be possible ischemic colitis as she has been having some abdominal pain. CT scan of the abdomen  has not been done on this admission. She did have a CT scan of the head without contrast showing no acute intracranial process. She had a portable chest film showing no acute cardiopulmonary disorder. She also had a film after a central line was inserted. She had a GI bleeding scan this morning which showed a "subtle curvilinear radiotracer activity in the left hemi- abdomen, does not peristalse, possibly secondary to normal gastric activity with a distended stomach". I have seen this finding previously on similar testing with other patients. There was, however, the feeling that abnormal extravasation of activity within the bowel lumen such as the transverse colon would be difficult to exclude, but felt less likely. This is quite likely negative or borderline study.   RECOMMENDATION:  Continue observation with serial hemoglobin. We will need to get the results of the last colonoscopy. This is quite likely a diverticular bleed and currently appears to be stable. It may be necessary to do a CT scan of the abdomen with attention to the mesenterics to rule out mesenteric insufficiency or possibly even repeat the colonoscopy or flexible sigmoidoscopy to rule out distal colonic problems consistent with anal outlet bleeding.  ____________________________ Lollie Sails, MD mus:bjt D: 02/20/2011 15:15:07 ET T: 02/20/2011 16:04:32 ET JOB#: [id number]  cc: Lollie Sails, MD, <Dictator> Lollie Sails MD ELECTRONICALLY SIGNED 03/01/2011 10:02

## 2014-06-13 NOTE — Consult Note (Signed)
Brief Consult Note: Diagnosis: lower GI bleed/hematochezia.   Patient was seen by consultant.   Consult note dictated.   Recommend further assessment or treatment.   Discussed with Attending MD.   Comments: Please see full GI consult [phone].  Joanna Ortiz admitted with hematochezia, several episodes both at home and at ER.  no recurrent since 3-4 pm (12 hr).  Joanna Ortiz has a history of Crohns disease including partial right hemicolectomy/ilectomy.  On biologic at home with recent addition of budesonide, to only moderate effect.  Had recent colonoscopy with her o/p GI MD 2 months ago.  DDX diverticular bleeding (most likely) vs anal outlet bleeding, less likely avms, or ischemic colitis.  Patient has a sbs on 11/20/10 showing extensive small bowel diverticulosis, but no evidence of inflammation. Equivocal/likely negative bleeding scan this am.  No recurrent bm for about 12 hours. Hemodynamically stable.  Will need to get results of last colonoscopy from Dr Ruel Favors office tomorrow, continue to observe, transfuse as needed.  If recurrent bleeding of significance, consider vascular consult.  May need to have repeat colonoscopy or CT abdomen to r/o ischemic colitis.  Will discuss further with Dr Dionne Milo in am before ordering further testing as patient is stable. Will get sed rate and crp.  Electronic Signatures: Loistine Simas (MD)  (Signed 01-Jan-13 15:23)  Authored: Brief Consult Note   Last Updated: 01-Jan-13 15:23 by Loistine Simas (MD)

## 2014-06-13 NOTE — Consult Note (Signed)
Chief Complaint:   Subjective/Chief Complaint repeat hemoglobin following several bloody bowel movements shows small drop to 10.5.  However repeat bleeding scan shows site in ruq c/w active bleeding in the region on the patients ileocolonic anastomosis re: previous crohns surgery.  Will notify surgery of the change of status.  Patient is seen to have a redundant colon on scan and may not be amenable to colonoscopic treatment under current conditions.  Will continue tfx as needed and continue obs in the ICU.   Electronic Signatures for Addendum Section:  Loistine Simas (MD) (Signed Addendum 01-Jan-13 19:26)  case discussed with Dr Felton Clinton.   Electronic Signatures: Loistine Simas (MD)  (Signed 01-Jan-13 19:21)  Authored: Chief Complaint   Last Updated: 01-Jan-13 19:26 by Loistine Simas (MD)

## 2014-06-13 NOTE — H&P (Signed)
PATIENT NAME:  Joanna Ortiz, Joanna Ortiz MR#:  [id number] DATE OF BIRTH:  May 26, 1945  DATE OF ADMISSION:  02/20/2011  PRESENTING COMPLAINT: Bright red blood per rectum.   DISCHARGE DIAGNOSES:  1. Lower gastrointestinal bleed presumably due to Crohn's disease flare.  2. Posthemorrhagic anemia, status post 2 unit blood transfusion.  3. History of rheumatoid arthritis.   PROCEDURE: Colonoscopy showed normal colon but friable terminal ileum.   MEDICATIONS:  1. Humira 40 mg sub-Q twice a month.  2. Thyroid medicine 30 mg p.o. daily.  3. Dicyclomine 10 mg 3 times a day as needed.  4. Tylenol/oxycodone 325/5 2 tablets q.4 to 6 p.r.n.  5. Budesonide 3 mg extended-release 3 capsules daily.  6. Calcium liquid p.o. daily.   FOLLOW-UP: 1. Follow-up with Dr. Dionne Milo on 03/14/2011 at 3:15.  2. Follow-up with Dr. Bary Castilla 03/08/2011 at 9:30 a.m.   LABS ON DISCHARGE: Chest x-ray mild interstitial opacities medial right lower lung secondary to atelectasis. Ultrasound abdomen no gallstones seen. Probable fatty infiltration of the liver. There is mild prominence of common bile duct.   Hemoglobin on discharge 8.7. Magnesium 1.8. Glucose 97, BUN 12, creatinine 0.69, sodium 149, potassium 3.6, chloride 110, bicarb 25. C-reactive protein is 13. ESR is 9. Urinalysis negative for urinary tract infection. GI blood loss studies subtle curvilinear radiotracer activity in the left hemi abdomen does not peristalse and may be secondary to normal gastric activity within a distended stomach. Abnormal extravasation of activity within a bowel lumen such as the transverse colon would be difficult to exclude. Cardiac enzymes x3 negative.   BRIEF SUMMARY OF HOSPITAL COURSE: Joanna Ortiz is a 69 year old Caucasian female with past medical history of rheumatoid arthritis and Crohn's who comes in with: 1. Acute lower GI bleed. The patient had bright red blood per rectum. Etiology initially remained unclear. She recently had a colonoscopy  as outpatient and did not show any diverticulitis. She had a positive GI bleeding scan in the ileocolonic area of previous surgery. Surgical consultation and GI consultation were made with Dr. Bary Castilla and Dr. Dionne Milo respectively. Surgery recommended no indication for surgery. She was status post 2 unit blood transfusion. Last hemoglobin was 8.7 on discharge. Dr. Dionne Milo recommended since no other cause was found he recommended a colonoscopy which was done and showed some friable terminal ileum and concluded that GI bleed could be due to her Crohn's flare. She was placed back on her budesonide which she will take as outpatient and follow-up with Dr. Dionne Milo on the above appointment.  2. Hypotension in the setting of posthemorrhagic anemia, improved after IV hydration and 2 unit blood transfusion.  3. History of Crohn's, on dicyclomine and budesonide.   4. Rheumatoid arthritis, on Humira shots.  5. DVT prophylaxis. The patient did have TEDs while in-house. No antiplatelet agents were recommended given her rectal bleed.   Hospital stay otherwise remained stable.   CODE STATUS: The patient remained a FULL CODE.   TIME SPENT: 40 minutes.  ____________________________ Hart Rochester Posey Pronto, MD sap:drc D: 03/06/2011 15:30:00 ET T: 03/06/2011 15:52:13 ET JOB#: [id number]  cc: Joanna Disbrow A. Posey Pronto, MD, <Dictator> [name], MD Joanna Side, MD Joanna Hartigan, MD

## 2014-06-13 NOTE — Consult Note (Signed)
Chief Complaint:   Subjective/Chief Complaint vital sign addendum.   VITAL SIGNS/ANCILLARY NOTES: **Vital Signs.:   01-Jan-13 14:26   Vital Signs Type Admission   Temperature Temperature (F) 97.8   Celsius 36.5   Temperature Source oral   Pulse Pulse 72   Pulse source per Dinamap   Respirations Respirations 20   Systolic BP Systolic BP 975   Diastolic BP (mmHg) Diastolic BP (mmHg) 87   Mean BP 103   BP Source Dinamap   Pulse Ox % Pulse Ox % 99   Pulse Ox Activity Level  At rest   Oxygen Delivery 2L; Nasal Cannula    16:33   Vital Signs Type Routine   Temperature Temperature (F) 98.8   Celsius 37.1   Temperature Source oral   Pulse Pulse 86   Pulse source per Dinamap   Respirations Respirations 20   Systolic BP Systolic BP 300   Diastolic BP (mmHg) Diastolic BP (mmHg) 84   Mean BP 101   BP Source Dinamap   Pulse Ox % Pulse Ox % 98   Pulse Ox Activity Level  At rest   Oxygen Delivery 2L; Nasal Cannula  *Intake and Output.:   01-Jan-13 14:27   Grand Totals Intake:   Output:      Net:   38 Hr.:     Weight Type admission   Weight Method Bed   Current Weight (lbs) (lbs) 182.7   Current Weight (kg) (kg) 82.8   Height Type stated   Height (ft) (feet) 5   Height (in) (in) 8   Height (cm) centimeters 172.7   BSA (m2) 1.9   BMI (kg/m2) 27.7    15:08   Grand Totals Intake:   Output:      Net:   24 Hr.:     Stool  Medium dark red runny stool with blood clots.    16:02   Grand Totals Intake:   Output:      Net:   24 Hr.:     Stool  medium dark red runny stool with blood clots.    16:24   Grand Totals Intake:   Output:      Net:   24 Hr.:     Stool  Large dark red runny stool.   Electronic Signatures: Loistine Simas (MD)  (Signed 01-Jan-13 16:42)  Authored: Chief Complaint, VITAL SIGNS/ANCILLARY NOTES   Last Updated: 01-Jan-13 16:42 by Loistine Simas (MD)

## 2014-06-13 NOTE — Op Note (Signed)
PATIENT NAME:  Joanna Ortiz, CALE MR#:  [id number] DATE OF BIRTH:  Jul 28, 1945  DATE OF PROCEDURE:  06/06/2011  PREOPERATIVE DIAGNOSIS: Abdominal pain, weight loss, past history of Crohn's disease.   POSTOPERATIVE DIAGNOSIS: Recurrent Crohn's disease.   OPERATIVE PROCEDURE: Exploratory celiotomy, small bowel resection with primary anastomosis.   SURGEON:  Hervey Ard, MD   ASSISTANT: Mckinley Jewel, MD  ANESTHESIA: General endotracheal under Dr. Boston Service with epidural.   ESTIMATED BLOOD LOSS: Less than 25 mL.   CLINICAL NOTE: This 69 year old woman has a two-year history of postprandial pain and weight loss. She had undergone a right cecectomy and resection of the terminal ileum in 1999 for Crohn's disease. Preoperative colonoscopy had shown no evidence of colonic Crohn's and the anastomosis to be free of disease. Small bowel series showed no evidence of obstruction. She is known to have multiple diverticula of the small bowel. Due to her persistent pain unresponsive to medical management including antimetabolite therapy, she was felt to be a candidate for exploration.   OPERATIVE NOTE: The patient received Entereg prior to the procedure. She received Invanz intravenously on induction of anesthesia. Thigh-high TED stockings and pneumatic compression stockings were used for deep vein thrombosis prophylaxis. Epidural was placed by Dr. Boston Service followed by the establishment of general endotracheal anesthesia. The abdomen was prepped with ChloraPrep and draped after a Foley catheter was placed by the nurse. The abdomen was entered through a midline incision. Exploration showed a few adhesions of the omentum to the anterior abdominal wall and these were taken down with cautery dissection. The gallbladder and liver were unremarkable. There is a duodenal diverticulum as previously identified both on CT and endoscopy. No intragastric masses are palpated. The colon was unremarkable. There was a  small amount of free fluid in the pelvis. The proximal small bowel was unremarkable until the distal 20 cm where there was dilatation and a 10 to 15-cm segment of typical Crohn's disease with fat wrapping and marked bowel wall thickening. Mesenteric thickening was also noted. Proximal to this were two additional 5 to 7-cm long areas of similar thickening without intervening dilatation. The total involved bowel length was 45 cm. This was felt to be safe to resect based on the large amount of proximal uninvolved bowel. The previous ileocolonic anastomotic area was mobilized from the right lower quadrant and the right lateral abdominal wall, taking care to protect the duodenum. The markedly thickened mesentery was divided with the use of the Harmonic scalpel. Additional hemostasis was with 3-0 Vicryl figure-of-eight sutures and 2-0 Vicryl ties. A side-to-side functional end-to-end anastomosis was completed using a GIA 75-mm stapler. The staple line was reinforced with interrupted 3-0 silk figure-of-eight sutures. The mesenteric defect was closed with a running 3-0 Vicryl suture. Surgeon's gloves were changed and the abdomen irrigated with warm normal saline. There was good hemostasis. Sponge, tape, and instrument counts were correct. The fascia was closed with interrupted 0 Prolene figure-of-eight sutures. The wound was irrigated a final time and adipose tissue approximated with a running 2-0 Vicryl suture. The skin was closed with staples. A dry dressing with Telfa and gauze was applied. The patient was taken to the recovery room in stable condition.     ____________________________ [name], MD jwb:bjt D: 06/06/2011 10:08:03 ET T: 06/06/2011 10:57:34 ET JOB#: [id number]  cc: [name], MD, <Dictator> Sofie Hartigan, MD Emmaline Kluver., MD Mckinley Jewel, MD Jonea Bukowski Amedeo Kinsman MD ELECTRONICALLY SIGNED 06/08/2011 18:33

## 2014-06-13 NOTE — Discharge Summary (Signed)
PATIENT NAME:  Joanna Ortiz, ANASTOS MR#:  [id number] DATE OF BIRTH:  05-18-1945  DATE OF ADMISSION:  02/20/2011 DATE OF DISCHARGE:  02/23/2011  PRESENTING COMPLAINT: Bright red blood per rectum.   DISCHARGE DIAGNOSES:  1. Lower gastrointestinal bleed presumably due to Crohn's disease flare.  2. Posthemorrhagic anemia, status post 2 unit blood transfusion.  3. History of rheumatoid arthritis.   PROCEDURE: Colonoscopy showed normal colon but friable terminal ileum.   MEDICATIONS:  1. Humira 40 mg sub-Q twice a month.  2. Thyroid medicine 30 mg p.o. daily.  3. Dicyclomine 10 mg 3 times a day as needed.  4. Tylenol/oxycodone 325/5 2 tablets q.4 to 6 p.r.n.  5. Budesonide 3 mg extended-release 3 capsules daily.  6. Calcium liquid p.o. daily.   FOLLOW-UP: 1. Follow-up with Dr. Dionne Milo on 03/14/2011 at 3:15.  2. Follow-up with Dr. Bary Castilla 03/08/2011 at 9:30 a.m.   LABS ON DISCHARGE: Chest x-ray mild interstitial opacities medial right lower lung secondary to atelectasis. Ultrasound abdomen no gallstones seen. Probable fatty infiltration of the liver. There is mild prominence of common bile duct.   Hemoglobin on discharge 8.7. Magnesium 1.8. Glucose 97, BUN 12, creatinine 0.69, sodium 149, potassium 3.6, chloride 110, bicarb 25. C-reactive protein is 13. ESR is 9. Urinalysis negative for urinary tract infection. GI blood loss studies subtle curvilinear radiotracer activity in the left hemi abdomen does not peristalse and may be secondary to normal gastric activity within a distended stomach. Abnormal extravasation of activity within a bowel lumen such as the transverse colon would be difficult to exclude. Cardiac enzymes x3 negative.   BRIEF SUMMARY OF HOSPITAL COURSE: Joanna Ortiz is a 69 year old Caucasian female with past medical history of rheumatoid arthritis and Crohn's who comes in with: 1. Acute lower GI bleed. The patient had bright red blood per rectum. Etiology initially remained unclear.  She recently had a colonoscopy as outpatient and did not show any diverticulitis. She had a positive GI bleeding scan in the ileocolonic area of previous surgery. Surgical consultation and GI consultation were made with Dr. Bary Castilla and Dr. Dionne Milo respectively. Surgery recommended no indication for surgery. She was status post 2 unit blood transfusion. Last hemoglobin was 8.7 on discharge. Dr. Dionne Milo recommended since no other cause was found he recommended a colonoscopy which was done and showed some friable terminal ileum and concluded that GI bleed could be due to her Crohn's flare. She was placed back on her budesonide which she will take as outpatient and follow-up with Dr. Dionne Milo on the above appointment.  2. Hypotension in the setting of posthemorrhagic anemia, improved after IV hydration and 2 unit blood transfusion.  3. History of Crohn's, on dicyclomine and budesonide.   4. Rheumatoid arthritis, on Humira shots.  5. DVT prophylaxis. The patient did have TEDs while in-house. No antiplatelet agents were recommended given her rectal bleed.   Hospital stay otherwise remained stable.   CODE STATUS: The patient remained a FULL CODE.   TIME SPENT: 40 minutes.  ____________________________ Hart Rochester Posey Pronto, MD sap:drc D: 03/06/2011 15:30:00 ET T: 03/06/2011 15:52:13 ET JOB#: [id number]  cc: [name], MD Jill Side, MD Sofie Hartigan, MD Laysa Kimmey A. Posey Pronto, MD, <Dictator>  Ilda Basset MD ELECTRONICALLY SIGNED 03/16/2011 7:28

## 2014-06-13 NOTE — H&P (Signed)
PATIENT NAME:  Joanna Ortiz, Joanna Ortiz MR#:  [id number] DATE OF BIRTH:  1945/05/27  DATE OF ADMISSION:  03/06/2011  REFERRING DOCTOR: Beaulah Dinning, MD   PRIMARY CARE PROVIDER: Dr. Ellison Hughs at [facility]: Generalized weakness, dizziness, heart beating fast with standing.   HISTORY OF PRESENT ILLNESS: The patient is a 69 year old white female who was recently hospitalized on January 1st and discharged on the 4th when she presented with bright red blood per rectum. She was thought to have lower GI bleed due to Crohn's disease flare. At that time she had transfusion and had a colonoscopy. The patient was discharged home and was continued on Humira and budesonide. The patient reports that she has been doing okay and has not had any further issues with blood per rectum. She has not had any abdominal pain, diarrhea, nausea, or vomiting. She, however, today started feeling very weak and could not get out of bed. She was brought to the ED. In the ER, the patient was noted to have orthostatic vitals based on her heart rate going up but blood pressure actually went up with standing. She would get very shaky and almost fell to the floor when they tried to walk her. Her laboratory evaluation didn't show any significant abnormalities except for slightly low potassium and a slightly low calcium. Otherwise, her CBC showed a normal WBC count. Her hemoglobin is slightly low. The patient, otherwise, denies any fevers or chills. No chest pains. No palpitations. No shortness of breath. No abdominal pain. Denies any nausea, vomiting, or diarrhea. Has been eating and drinking appropriately. She denies any urinary frequency, urgency, or hesitancy.   PAST MEDICAL HISTORY:  1. Crohn's disease.  2. Osteoarthritis. A. Cervical disk disease.  3. Thoracic degenerative disk disease.  4. Ankylosing spondylitis.  5. Hypothyroidism.  6. Pernicious anemia.  7. Hypertriglyceridemia.  8. Restless leg syndrome.   9. History of hypocalcemia.  10. Hypomagnesemia.  11. History of chronic lymphedema involving lower extremity and hands.   PAST SURGICAL HISTORY:  1. Status post thyroidectomy. 2. Hysterectomy. 3. Bowel obstruction. 4. Breast excision.   ALLERGIES: Cipro, Entocort, and sulfa.   CURRENT MEDICATIONS:  1. Humira 40 mg sub-Q b.i.d. 2. Armour Thyroid 30 mg 2 tabs b.i.d.  3. Budesonide 3 mg extended-release two caps daily.  4. Tramadol 50 q.6 p.r.n.  5. Percocet 5/325 q.4 p.r.n. pain.  6. Magnesium oxide 400 b.i.d.  7. Dicyclomine 10 mg three times a day as needed.  8. Calcium liquid p.o. daily.   SOCIAL HISTORY: Does not smoke. Does not drink. No drugs.   FAMILY HISTORY: Coronary artery disease and breast cancer.   REVIEW OF SYSTEMS: CONSTITUTIONAL: Denies any fevers. Complains of fatigue and weakness. No pain. No weight loss. No weight gain. EYES: No blurred or double vision. No pain. No redness. No inflammation. No glaucoma. No cataracts. ENT: No tinnitus. No ear pain. No hearing loss. No seasonal or year-round allergies. No epistaxis. No nasal discharge. No snoring. No postnasal drip. No sinus pain. RESPIRATORY: No cough. No wheezing. No hemoptysis. No dyspnea. No chronic obstructive pulmonary disease. No pneumonia. CARDIOVASCULAR: No chest pain or orthopnea. Has chronic lymphedema. No arrhythmias. No palpitations. No syncope. GI: No nausea, vomiting, or diarrhea. No abdominal pain. No hematemesis. No melena. No gastroesophageal reflux disease. Does have inflammatory bowel disease with Crohn's. GU: Denies any dysuria, hematuria, renal calculus, frequency, or incontinence. ENDOCRINE: Denies any polyuria, nocturia, or thyroid problems. No increase in sweating, heat or cold intolerance,  or thirst. HEME/LYMPH: Has history of anemia. No easy bruising. No bleeding. No swollen glands. SKIN: No acne. No rash. No changes in mole, hair or skin. MUSCULOSKELETAL: Denies any pain in the neck, back or  shoulder. NEUROLOGIC: No numbness. No weakness. No cerebrovascular accident. No transient ischemic attack. No seizures. PSYCHIATRIC: No anxiety. No insomnia. No ADD. No OCD.   PHYSICAL EXAMINATION:    VITAL SIGNS: Temperature 98.9, pulse 96, respirations 20, blood pressure lying was 158/85, actually with standing it went to 165/85, however, her pulse oximetry went from 94 to 126.   GENERAL: The patient is a 69 year old female in no acute distress.   HEENT: Head atraumatic, normocephalic. Pupils equally round, reactive to light and accommodation. Extraocular movements intact. There is no conjunctival pallor. No scleral icterus. Oropharynx is a little dry. Ear exam shows no drainage or ulceration. Nasal exam shows no drainage or ulceration.   NECK: No thyromegaly. No carotid bruits.   CARDIOVASCULAR: Regular rate and rhythm. No murmurs, rubs, clicks, or gallops. PMI is not displaced.   LUNGS: Clear to auscultation bilaterally without any rales, rhonchi, or wheezing.   ABDOMEN: Soft, nontender, nondistended. Positive bowel sounds x4.   EXTREMITIES: No clubbing, cyanosis, or edema.   NEUROLOGIC: Awake, alert, oriented x3. No focal deficits.   SKIN: There is no rash.   LYMPHATICS: No lymph nodes palpable.    VASCULAR: Good DP, PT pulses.   NEUROLOGICAL: Awake, alert, oriented x3. No focal deficits.   LABORATORY, DIAGNOSTIC, AND RADIOLOGICAL DATA: Chest x-ray shows mild interstitial opacities in the medial right lower lung likely due to atelectasis. CPK 24. CK-MB 0.5. Glucose 120, BUN 8, creatinine 0.79, sodium 144, potassium 3.4, chloride 108, CO2 26, calcium 8.3. LFTs showed albumin of 2.7. WBC 6.5, hemoglobin 11.3, platelet count 278. TSH 2.78. Urinalysis showed nitrites negative, leukocytes negative. EKG showed left anterior fascicular block, normal sinus rhythm.   ASSESSMENT AND PLAN: The patient is a 69 year old white female with history of Crohn's disease who was in the hospital  recently with anemia and GI bleed and returns to the ED with generalized weakness and noted to have orthostatic vital sign changes with heart rate with walking.  1. Generalized weakness with orthostatic vitals possibly due to dehydration. No clear loss for volume loss. At this time will give her IV fluids. Place her on tele. Monitor overnight and likely discharge in the a.m. I will have physical therapy ambulate with her in the morning.  2. Crohn's disease, on budesonide taper which we will continue as previously taking at home. Continue Humira as taking as outpatient.  3. History of pernicious anemia. Hemoglobin is currently stable. Monitor in the a.m.  4. Hypothyroidism. TSH is currently normal.  TIME SPENT: 35 minutes.   ____________________________ Lafonda Mosses Posey Pronto, MD shp:drc D: 03/06/2011 17:13:46 ET T: 03/06/2011 17:34:42 ET JOB#: [id number]  cc: Islam Villescas H. Posey Pronto, MD, <Dictator> Sofie Hartigan, MD Alric Seton MD ELECTRONICALLY SIGNED 03/24/2011 8:13

## 2014-06-13 NOTE — Consult Note (Signed)
Chief Complaint:   Subjective/Chief Complaint Called by nursing to report 2 bm that were frankly bloody with clots.  Will obtain a stat hgb and repeat bleeding scan.  Will discuss with vascular surgery on call.   Electronic Signatures: Loistine Simas (MD)  (Signed 01-Jan-13 16:35)  Authored: Chief Complaint   Last Updated: 01-Jan-13 16:35 by Loistine Simas (MD)

## 2014-06-13 NOTE — Consult Note (Signed)
Chief Complaint:   Subjective/Chief Complaint Colonoscopy showed ileal inflammation without discrete ulcers. No blood or bleeding was noted.  Recommendations: Soft diet. Follow up with me in 2 weeks (orders written). Discussed with Dr. Posey Pronto.   Electronic Signatures: Jill Side (MD)  (Signed 04-Jan-13 15:01)  Authored: Chief Complaint   Last Updated: 04-Jan-13 15:01 by Jill Side (MD)

## 2014-06-13 NOTE — Consult Note (Signed)
Chief Complaint:   Subjective/Chief Complaint Case discussed with Dr. Posey Pronto. Due to significant blood loss, I agree that a repeat colonoscopy before discharge would be appropriate as we might gain new information helpful in preventing recurrent bleeding. Discussed with the patient and she is in full agreement.   Electronic Signatures: Jill Side (MD)  (Signed 03-Jan-13 16:35)  Authored: Chief Complaint   Last Updated: 03-Jan-13 16:35 by Jill Side (MD)

## 2014-06-13 NOTE — Consult Note (Signed)
Chief Complaint:   Subjective/Chief Complaint patient doing well today, but weak, small bm early this am.  denies n/v or abdominal pain.   VITAL SIGNS/ANCILLARY NOTES: **Vital Signs.:   02-Jan-13 16:00   Vital Signs Type Upon Transfer   Temperature Temperature (F) 97.4   Celsius 36.3   Temperature Source oral   Pulse Pulse 77   Pulse source per Dinamap   Respirations Respirations 20   Systolic BP Systolic BP 149   Diastolic BP (mmHg) Diastolic BP (mmHg) 71   Mean BP 91   BP Source Dinamap   Pulse Ox % Pulse Ox % 99   Pulse Ox Activity Level  At rest   Oxygen Delivery 2L   Brief Assessment:   Cardiac Regular    Respiratory clear BS    Gastrointestinal details normal Soft  Nontender  Nondistended  No masses palpable  Bowel sounds normal   Routine Hem:  01-Jan-13 11:34    Hemoglobin (CBC) 11.1    17:01    Hemoglobin (CBC) 10.5    23:55    Hemoglobin (CBC) 9.0  Routine Chem:  02-Jan-13 05:08    Glucose, Serum 97   BUN 12   Creatinine (comp) 0.69   Sodium, Serum 144   Potassium, Serum 3.6   Chloride, Serum 110   CO2, Serum 25   Calcium (Total), Serum 6.9   Osmolality (calc) 287   eGFR (African American) >60   eGFR (Non-African American) >60   Anion Gap 9  Routine Hem:  02-Jan-13 05:08    WBC (CBC) 6.7   RBC (CBC) 3.07   Hemoglobin (CBC) 8.9   Hematocrit (CBC) 26.7   Platelet Count (CBC) 141   MCV 87   MCH 29.0   MCHC 33.4   RDW 14.9  Routine Chem:  02-Jan-13 05:08    Magnesium, Serum 1.5  Routine Hem:  02-Jan-13 05:08    Neutrophil % 65.2   Lymphocyte % 24.7   Monocyte % 7.8   Eosinophil % 1.7   Basophil % 0.6   Neutrophil # 4.4   Lymphocyte # 1.7   Monocyte # 0.5   Eosinophil # 0.1   Basophil # 0.0  Routine Chem:  02-Jan-13 15:28    Magnesium, Serum 1.8   Radiology Results: Nuclear Med:    01-Jan-13 10:18, GI Blood Loss Study - Nuc Med   GI Blood Loss Study - Nuc Med    REASON FOR EXAM:      COMMENTS:       PROCEDURE: NM  - NM GI  BLOOD LOSS STUDY  - Feb 20 2011 10:18AM     RESULT:  Comparison: None.    Radiopharmaceutical: 24.56 mCi technetium 83m    Technique: Standard departmental tagged red cell scan was performed, with   monitoring via anterior planar images.    Findings:  There is a subtle curvilinear area of radiotracer activity in the left   mid abdomen. This persists throughout the study, unchanged. There is no     peristalsis. This may represent gastric activity within a distended   stomach. Otherwise, no areas of peristalsing radiotracer activity to   suggest active extravasation into the bowel. Radiotracer activity within   the bladder obscures the rectum.    IMPRESSION:    Subtle curvilinear radiotracer activity in the left hemiabdomen does not   peristalse and may be secondary to normal gastric activity within a   distended stomach. Abnormal extravasation of activity within a bowel   lumen,  such as the transverse colon, would be difficult to exclude, but   felt less likely.      Addendum:  The patient was brought back for delayed images at approximately 1730     hours. The initial image demonstrated radiotracer activity in the right   upper quadrant and overlying the upper abdomen in a curvilinear fashion.   In comparison with the prior CT of the abdomen and pelvis 12/14/2008,   this likely represents extravasation of radiotracer into the bowel in the   region of the ileocolic anastomosis in the right upper quadrant. There   was peristalsis of this activity through the region of the transverse   colon and into the descending colon. Radiotracer activity in the midline   pelvis and extending into the right hemipelvis is felt to represent   accumulation of activity in the redundantsigmoid colon. Attempt was made   to obtain post void image to diminish the obscuring activity in the   bladder. On the lateral view, it was noted that this radiotracer activity   appeared to coarse in a  retroperitoneal fashion as would be expected for   the sigmoid colon and rectum.    Overall, findings are concerning for active bleeding in the region of the     ileocolic anastomosis in the right upper quadrant, with collection of   this hemorrhage in the sigmoid colon and rectum. This was discussed with   Dr. Loistine Simas at 1800 hours 02/20/2011.          Verified By: Gregor Hams, M.D., MD   Assessment/Plan:  Assessment/Plan:   Assessment 1) lower GI bleed-positive bleeding scan yesterday consistant with possible site at /close to anastomosis.  Currently stable no repeat large volume loss since last night.  2) h/o crohns disease-recent increase symptom, on humira and budesonide.    Plan 1) case discussed with DR Dionne Milo who is her o/p GI MD, and did her her last colonoscopy at his facility about 6 weeks ago.  He is reviewing his chart prior to decision on repeat colonoscopy versus surgical approach for revision.  Consult placed for Dr Bary Castilla who did her right colectomy ilectomy.  Dr Dionne Milo will be assuming GI care today.  I appreciate care of Dr Leanora Cover and Dr Felton Clinton over the holiday.   Electronic Signatures: Loistine Simas (MD)  (Signed 02-Jan-13 17:19)  Authored: Chief Complaint, VITAL SIGNS/ANCILLARY NOTES, Brief Assessment, Lab Results, Radiology Results, Assessment/Plan   Last Updated: 02-Jan-13 17:19 by Loistine Simas (MD)

## 2014-06-13 NOTE — Consult Note (Signed)
Chief Complaint:   Subjective/Chief Complaint Patient seen today on the request of Dr. Donnella Sham. No signs of active bleeding. I have reviewed the report of last colonoscopy done few weeks ago which was unremarkable. No divertculi were noted and anstomosis was unremarkable. Prior colonoscopies by Dr. Allen Norris and Dr. Vira Agar did not mention diverticulosis either. I believe she has bled either from anatomosis or from with the small bowel. Agree with surgical consultation. As recent colonoscopy was unremarkable, I am not sure of the therapeutic value of repeat colonoscopy in the abscence of active bleeding. I will continue to observe her closely and perform a colonoscopy if there are signs of continued or recurrent bleeding.   Electronic Signatures: Jill Side (MD)  (Signed 02-Jan-13 18:45)  Authored: Chief Complaint   Last Updated: 02-Jan-13 18:45 by Jill Side (MD)

## 2014-06-13 NOTE — Discharge Summary (Signed)
PATIENT NAME:  Joanna Ortiz, LEITZ MR#:  [id number] DATE OF BIRTH:  January 25, 1946  DATE OF ADMISSION:  06/06/2011 DATE OF DISCHARGE:  06/10/2011  DISCHARGE DIAGNOSIS: Recurrent Crohn's disease of the small bowel.  CLINICAL NOTE: This 69 year old woman had had a two-year history of abdominal pain and weight loss. There was a suggestion of a recurrent inflammatory process based on CT. She had never been shown to have clear-cut obstruction. She was felt to be a candidate for exploration. She was taken to the Operating Room on April 17th, at which time she underwent abdominal exploration with a finding of recurrent Crohn's disease involving the distal ileum. There were three focal segments of stricture with an area of dilated bowel between the second and third (most distal) area. A total of 45 cm of small bowel was resected and a new ileocolic anastomosis created with the stapler.   The patient's postoperative course was unremarkable. She had mild nausea on postoperative day one that quickly resolved. She was begun on a clear liquid diet. She ambulated well. She tolerated a diet and advanced without difficulty. During this time, she maintained a clear cardiopulmonary exam.   At the time of discharge, she was taking a soft diet without difficulty. She was instructed to resume her normal thyroid supplements. She was provided with Percocet to use if need for pain. Arrangements were made for followup in my office on April 26th.   SUMMARY OF LABORATORY STUDIES:  1. Discharge hemoglobin 9.0, white blood cell count 7800 with normal differential. 2. Potassium 3.4, creatinine 0.65, serum calcium 7.2.  3. The patient was B+ blood type.  4. Pathology showed chronic active colitis with features consistent with history of Crohn's disease. Ulceration and transmural inflammation was noted within the small bowel segment.       The anastomotic and proximal lines of resection were free of inflammatory bowel disease. Five  lymph nodes showed no evidence of malignancy.  ____________________________ [name], MD jwb:cbb D: 06/18/2011 12:22:37 ET T: 06/18/2011 13:09:10 ET JOB#: [id number]  cc: [name], MD, <Dictator> Sofie Hartigan, MD Teondre Jarosz Amedeo Kinsman MD ELECTRONICALLY SIGNED 06/18/2011 16:47

## 2014-06-14 LAB — SURGICAL PATHOLOGY

## 2014-06-15 ENCOUNTER — Ambulatory Visit
Admit: 2014-06-15 | Disposition: A | Discharge status: Home / Self Care | Payer: Self-pay | Attending: General Surgery | Admitting: General Surgery

## 2014-06-15 ENCOUNTER — Other Ambulatory Visit: Payer: Self-pay

## 2014-06-15 DIAGNOSIS — K529 Noninfective gastroenteritis and colitis, unspecified: Secondary | ICD-10-CM | POA: Diagnosis not present

## 2014-06-15 DIAGNOSIS — K625 Hemorrhage of anus and rectum: Secondary | ICD-10-CM | POA: Diagnosis not present

## 2014-06-15 DIAGNOSIS — D123 Benign neoplasm of transverse colon: Secondary | ICD-10-CM | POA: Diagnosis not present

## 2014-06-15 LAB — SEDIMENTATION RATE: ERYTHROCYTE SED RATE: 34 mm/h — AB (ref 0–30)

## 2014-06-15 LAB — HEMOGLOBIN: HGB: 13.3 g/dL (ref 12.0–16.0)

## 2014-06-16 ENCOUNTER — Encounter: Payer: Self-pay | Admitting: General Surgery

## 2014-06-20 NOTE — H&P (Signed)
PATIENT NAME:  Joanna Ortiz, Joanna Ortiz MR#:  [id number] DATE OF BIRTH:  [birth year]  DATE OF ADMISSION:  06/04/2014  PRIMARY CARE PHYSICIAN: Sofie Hartigan, MD  REFERRING PHYSICIAN: Verdia Kuba. Paduchowski, MD   CHIEF COMPLAINT: Rectal bleeding today.   HISTORY OF PRESENT ILLNESS: A 70 year old Caucasian female with a history of Crohn disease, status post surgery, came to the ED tonight due to rectal bleeding. The patient is alert, awake, oriented, in no acute distress. The patient had a history of Crohn disease and GI bleeding. The patient is status post small-bowel resection 3 years ago. The patient was fine until today. The patient started to have rectal bleeding about 7:30 tonight, which is dark red mixed with stool. The patient has a total of 3 episodes of rectal bleeding. The patient denies any abdominal pain, but has mild nausea. No vomiting or diarrhea. The patient denies any fever or chills. No headache or dizziness. She denies any other bleeding.   PAST MEDICAL HISTORY: 1.  Crohn disease status post surgery.  2.  Osteoarthritis.  3.  Ankylosing spondylitis. 4.  Hypothyroidism.  5.  Pernicious anemia.  6.  Hypertriglyceridemia.  7.  Restless legs syndrome.  8.  Hypocalcemia.  9.  Lymphedema.   PAST SURGICAL HISTORY: 1.  Thyroidectomy.  2.  Hysterectomy.  3.  Bowel obstruction surgery.  4.  Breast excision.  5.  Small-bowel resection 3 years ago.   SOCIAL HISTORY: No smoking or drinking or illicit drugs.   FAMILY HISTORY: CAD and breast cancer.   ALLERGIES: CIPRO, CODEINE, DILAUDID, ENTOCORT EC, ERYTHROMYCIN, KEFLEX, LEVAQUIN, SULFA, BAND-AIDS.  HOME MEDICATIONS: 1.  Tylenol 500 mg p.o. 1 to 2 tablets every 6 hours p.r.n. 2.  Turmeric capsule 1 capsule once a day.  3.  Tramadol 50 mg p.o. every 6 hours p.r.n. 4.  Synthroid 100 mcg p.o. daily.  5.  Multivitamin 1 tablet p.o. daily. 6.  Humira 1 dose subcutaneous twice a month.  7.  Fish oil 1 capsule once a day.  8.  Armour  Thyroid 30 mg p.o. daily.  REVIEW OF SYSTEMS:  CONSTITUTIONAL: The patient denies any fever or chills. No headache or dizziness or weakness.  EYES: No double vision or blurry vision.  EARS, NOSE, AND THROAT: No postnasal drip, slurred speech, or dysphagia.  CARDIOVASCULAR: No chest pain, palpitation, orthopnea, or nocturnal dyspnea. No leg edema.  PULMONARY: No cough, sputum, shortness of breath, or hemoptysis.  GASTROINTESTINAL: No abdominal pain but has mild nausea. No vomiting or diarrhea but has bloody stool.  GENITOURINARY: No dysuria, hematuria, or incontinence.  SKIN: No rash or jaundice.  NEUROLOGY: No syncope, loss of consciousness, or seizure.  ENDOCRINOLOGY: No polyuria, polydipsia, heat or cold intolerance.   PHYSICAL EXAMINATION:  VITAL SIGNS: Temperature 97.8, blood pressure 200/84, pulse 102, oxygen saturation 100% on room air.  GENERAL: The patient is alert, awake, oriented, in no acute distress.  HEENT: Pupils round, equal and reactive to light and accommodation. Moist oral mucosa. Clear pharynx.  NECK: Supple. No JVD or carotid bruit. No lymphadenopathy. No thyromegaly.  CARDIOVASCULAR: S1 and S2. Regular rate, rhythm. No murmurs or gallops.  PULMONARY: Bilateral air entry. No wheezing or rales. No use of accessory muscles to breathe.  ABDOMEN: Soft. No distention or tenderness. No organomegaly. Bowel sounds present.  EXTREMITIES: No edema, clubbing, or cyanosis. No calf tenderness. Bilateral pedal pulses present.  SKIN: No rash or jaundice.  NEUROLOGY: A and O x 3. No focal deficit. Power 5/5. Sensory intact.  LABORATORY DATA: Glucose 205, BUN 10, creatinine 0.77. Electrolytes normal. Liver function test is normal. CBC normal. EKG shows normal sinus rhythm at 82 BPM.   IMPRESSIONS:  1.  Gastrointestinal bleeding. Rule out Crohn disease recurrence. 2.  Hypothyroidism.  3.  Hypertension.   PLAN OF TREATMENT:  1.  The patient will be admitted to the medical floor.  The patient will be keep n.p.o. except medication and ice. I will start Protonix IV q.12 hours and check hemoglobin q.6 hours. I will get surgical consult from Dr. Bary Castilla, who is the patient's surgeon. In. 2.  For hypothyroidism, continue Synthroid.  3.  I will start intravenous fluid support.  I discussed the patient's condition and plan of treatment with the patient and the patient's husband.   CODE STATUS: The patient wants full code.   TIME SPENT: About 55 minutes.    ____________________________ Demetrios Loll, MD qc:ST D: 06/04/2014 21:32:24 ET T: 06/04/2014 22:01:58 ET JOB#: [id number]  cc: Demetrios Loll, MD, <Dictator> Demetrios Loll MD ELECTRONICALLY SIGNED 06/05/2014 12:21

## 2014-06-20 NOTE — Discharge Summary (Signed)
PATIENT NAME:  Joanna Ortiz, Joanna Ortiz MR#:  [id number] DATE OF BIRTH:  12/11/1945  DATE OF ADMISSION:  06/04/2014 DATE OF DISCHARGE:  06/06/2014  ADMITTING PHYSICIAN:  Dr. Bridgett Larsson.    DISCHARGING PHYSICIAN:  Dr. Gladstone Lighter.  PRIMARY CARE PHYSICIAN:  Dr. Ellison Hughs.   [place]:  None.   PRIMARY GASTROENTEROLOGIST:  [facility].    DISCHARGE DIAGNOSES:  1. Rectal bleed, likely diverticular.   2. Crohn's disease.  3. Rheumatoid arthritis.  4. Hypothyroidism.   DISCHARGE HOME MEDICATIONS:  1. Armour Thyroid 30 mg p.o. q. daily.  2. Synthroid 100 mcg p.o. q. daily.  3. Multivitamin 1 tablet p.o. q. daily.  4. Fish oil 1 gram capsule q. daily.  5.   Turmeric- 1 capsule q. daily.  6. Tylenol 500 mg 1-2 tablets every 6 hours as needed for pain.  7. Tramadol 50 mg q. 6 hours p.r.n. for pain.  8. Humira 1 dose subcutaneously twice a month.   DISCHARGE DIET:  Regular.   DISCHARGE ACTIVITY:  As tolerated.   FOLLOWUP INSTRUCTIONS:  1. Follow up with Dr. Bary Castilla for outpatient colonoscopy.  2. PCP followup with [facility] as per scheduled.   LABORATORIES AND IMAGING STUDIES PRIOR TO DISCHARGE:  WBC 8.9, hemoglobin 10.8, hematocrit 32.5, platelet count 154,000.   Sodium 142, potassium 3.5, chloride 111, bicarbonate 26, BUN 7, creatinine 0.65, glucose 116, and calcium of 7.6.   BRIEF HOSPITAL COURSE:  Ms. Remund is a very pleasant 69 year old Caucasian female with past medical history significant for Crohn's disease, status post small intestine resection done by Dr. Bary Castilla in the past, presents to the hospital secondary to rectal bleed.    Rectal bleed could be diverticular bleed.  It was spontaneous and self-resolved.  The patient states it was not associated with any abdominal pain or ongoing diarrhea with bleeding, so does not appear to be Crohn's disease exacerbation.  This could be diverticular bleed.  The patient has not had colonoscopy recently, so she was suggested for  outpatient colonoscopy.  Hemoglobin remained stable after a slight drop from hemodilution.  The patient would like to follow up with Dr. Bary Castilla for colonoscopy.  She was able to tolerate regular diet, ambulating, had a couple of bowel movements in the hospital with no blood, and is being discharged home with outpatient followup.  All her other home medications are being continued without any changes.   DISCHARGE CONDITION:  Stable.   DISCHARGE DISPOSITION:  Home.   TIME SPENT ON DISCHARGE:  Forty minutes.    ____________________________ Gladstone Lighter, MD rk:kc D: 06/06/2014 11:36:45 ET T: 06/06/2014 14:31:14 ET JOB#: [id number]  cc: Gladstone Lighter, MD, <Dictator> Sofie Hartigan, MD [name], MD Gladstone Lighter MD ELECTRONICALLY SIGNED 06/11/2014 15:15

## 2014-06-22 ENCOUNTER — Telehealth: Payer: Self-pay | Admitting: General Surgery

## 2014-06-22 NOTE — Telephone Encounter (Signed)
PT CALLED TODAY STATING SHE HAD REALLY BAD GAS ON Saturday.SHE IS ALSO HAVING REFLUX WHEN SHE EATS & ISN'T ABLE TO  SLEEP BECAUSE OF IT.TO TRY & GET RELIEF SHE HAS TAKEN PEPCID & TUMBS,TO LITTLE OR NO HELP.WHE SHE BURPS IT SMELLS LIKE BOILD EGGS & TATE LIKE SULFA. I CHECKED TO SEE & PATH WAS NOT IN FROM UPPER OR LOWER.

## 2014-06-23 NOTE — Telephone Encounter (Signed)
Notified patient as instructed, patient agrees, states she may have had a "virus" as well because she just felt bad for about 3-4 days.

## 2014-06-23 NOTE — Telephone Encounter (Signed)
Biopsies showed focal area of active colitis at the anastomosis, < 2 cm sq area. No rx required.

## 2014-07-13 LAB — SURGICAL PATHOLOGY

## 2014-07-26 ENCOUNTER — Ambulatory Visit: Payer: Federal, State, Local not specified - PPO | Attending: Rheumatology | Admitting: Physical Therapy

## 2014-07-26 ENCOUNTER — Encounter: Payer: Self-pay | Admitting: Physical Therapy

## 2014-07-26 DIAGNOSIS — M6281 Muscle weakness (generalized): Secondary | ICD-10-CM | POA: Diagnosis not present

## 2014-07-26 DIAGNOSIS — M25612 Stiffness of left shoulder, not elsewhere classified: Secondary | ICD-10-CM | POA: Diagnosis not present

## 2014-07-26 DIAGNOSIS — M25512 Pain in left shoulder: Secondary | ICD-10-CM | POA: Insufficient documentation

## 2014-07-26 NOTE — Therapy (Signed)
[place] [facility] [address] [place], [place], [address] Phone: [phone]   Fax:  [phone]  Physical Therapy Evaluation  Patient Details  Name: Joanna Ortiz MRN: [id number] Date of Birth: [birth year] Referring Provider:  Emmaline Kluver.,*  Encounter Date: 07/26/2014      PT End of Session - 07/26/14 1116    Visit Number 1   Number of Visits 8   Date for PT Re-Evaluation 08/23/14   Authorization - Visit Number 1   Authorization - Number of Visits 10   PT Start Time [phone]   PT Stop Time 1106   PT Time Calculation (min) 74 min   Activity Tolerance Patient limited by pain   Behavior During Therapy [facility] for tasks assessed/performed      Past Medical History  Diagnosis Date  . Crohn disease   . Thyroid disease     Past Surgical History  Procedure Laterality Date  . Abdominal hysterectomy  1987  . Thyroid surgery  1992  . Small intestine surgery  2012  . Colonoscopy  2013  . Colon surgery  06/06/2011    Resection of terminal ileum and previous ileocolonic anastomosis for recurrent Crohn's disease. 3 focal strictures identified.    There were no vitals filed for this visit.  Visit Diagnosis:  Left shoulder pain  Stiffness of left shoulder joint  Muscle weakness (generalized)      Subjective Assessment - 07/26/14 0957    Subjective Pt. reports 5/10 L shoulder pain currently at rest.  Pt. states neck feels stiff.     Limitations Sitting;Lifting   Currently in Pain? Yes   Pain Score 5    Pain Location Shoulder   Pain Orientation Left   Pain Type Chronic pain   Pain Onset More than a month ago   Multiple Pain Sites No            [facility] PT Assessment - 07/26/14 0001    Assessment   Medical Diagnosis L shoulder pain   Onset Date/Surgical Date 10/20/13   Hand Dominance Left   Next MD Visit 9/16      QuickDASH: 41%  L hand dominant.  Pt. Received injection to L posterior shoulder with no  benefit reported on 07/09/14.  Cervical AROM: flexion (42 deg.), ext. (35 deg.), L rotn. (32 deg.)- pain, R rotn. (44 deg.), L lat. Flex. (28 deg.), R lat. Flex. (25 deg.).    Pain with resisted biceps/ triceps.  (+) impingement in L shoulder, not R.         PT Education - 07/26/14 1115    Education provided Yes   Education Details See HEP   Person(s) Educated Patient   Methods Explanation;Demonstration;Handout   Comprehension Verbalized understanding;Returned demonstration;Verbal cues required             PT Long Term Goals - 07/26/14 1133    PT LONG TERM GOAL #1   Title Pt. I with HEP to increase L shoulder AROM to WNL as compared to R shoulder to improve pain-free mobility.     Time 4   Period Weeks   Status New   PT LONG TERM GOAL #2   Title Pt. able to lifting 10# object overhead with no c/o L shoulder pain to improve mobility.    Time 4   Period Weeks   Status New   PT LONG TERM GOAL #3   Title Pt. able to tolerate sleeping on L shoulder/side with no  increase c/o pain reported.    Time 4   Period Weeks   Status New   PT LONG TERM GOAL #4   Title Pt. will decrease QuickDASH to <30% to improve self-perceived disability with UE.     Baseline 41% on 05-Aug-2014   Time 4   Period Weeks   Status New            Plan - 08-05-14 1117    Clinical Impression Statement Pt. is a pleasant 69 y/o female with chronic L shoulder pain since 10/2013.  Pt. reports 5/10 L shoulder pain currently at rest and reports difficulty with overhead reaching/ repetitive tasks and lifting objects.  Pt. is L hand dominant and presents with functional B shoulder AROM all planes except L shoulder pain with end-range flexion/ abd. and AROM limited to 10 deg. L ER.  R shoulder strength grossly 5/5 MMT.  L shoulder strength 4/5 MMT (pain limited flexion/ ER).  (+) L shoulder impingement special tests.  (+) tenderness with L anterior/ proximal/ medial bicep tenderness.  Significant cervical/ upper  thoracic hypomobility noted with manual tx. and multiple L UT trigger points/ regions of tenderness with palpation.  Pt. will benefit from skilled PT services to increase L shoulder AROM/ strength to improve pain-free mobility.                                                                                  Pt will benefit from skilled therapeutic intervention in order to improve on the following deficits Hypomobility;Decreased strength;Impaired UE functional use;Pain;Decreased mobility;Decreased range of motion;Improper body mechanics   Rehab Potential Good   PT Frequency 2x / week   PT Duration 4 weeks   PT Treatment/Interventions ADLs/Self Care Home Management;Passive range of motion;Patient/family education;Cryotherapy;Electrical Stimulation;Iontophoresis 4mg /ml Dexamethasone;Therapeutic activities;Manual techniques;Therapeutic exercise;Moist Heat   PT Next Visit Plan Reassess HEP/ progress pain-free L shoulder ROM/ check X-ray   PT Home Exercise Plan see handouts (1-2x/day).     Consulted and Agree with Plan of Care Patient          G-Codes - August 05, 2014 1138    Functional Limitation Carrying, moving and handling objects   Carrying, Moving and Handling Objects Current Status [id number]) At least 40 percent but less than 60 percent impaired, limited or restricted   Carrying, Moving and Handling Objects Goal Status ([id number]) At least 1 percent but less than 20 percent impaired, limited or restricted       Problem List Patient Active Problem List   Diagnosis Date Noted  . Rectal bleeding 06/10/2014  . AP (abdominal pain) 12/29/2013  . Ventral hernia without obstruction or gangrene 12/18/2013  . Crohn's disease 12/18/2013    Pura Spice, PT, DPT # [phone]   08-05-14, 11:41 AM  [facility] [address] [place], [place], [address] Phone: [phone]   Fax:  [phone]

## 2014-07-27 ENCOUNTER — Other Ambulatory Visit
Admission: RE | Admit: 2014-07-27 | Discharge: 2014-07-27 | Disposition: A | Discharge status: Home / Self Care | Payer: Federal, State, Local not specified - PPO | Source: Ambulatory Visit | Attending: Otolaryngology | Admitting: Otolaryngology

## 2014-07-27 DIAGNOSIS — E890 Postprocedural hypothyroidism: Secondary | ICD-10-CM | POA: Diagnosis present

## 2014-07-27 LAB — TSH: TSH: 2.903 u[IU]/mL (ref 0.350–4.500)

## 2014-07-27 LAB — T4, FREE: FREE T4: 0.82 ng/dL (ref 0.61–1.12)

## 2014-07-28 ENCOUNTER — Ambulatory Visit: Payer: Federal, State, Local not specified - PPO | Admitting: Physical Therapy

## 2014-07-28 ENCOUNTER — Encounter: Payer: Self-pay | Admitting: Physical Therapy

## 2014-07-28 DIAGNOSIS — M6281 Muscle weakness (generalized): Secondary | ICD-10-CM

## 2014-07-28 DIAGNOSIS — M25512 Pain in left shoulder: Secondary | ICD-10-CM

## 2014-07-28 DIAGNOSIS — M25612 Stiffness of left shoulder, not elsewhere classified: Secondary | ICD-10-CM

## 2014-07-28 LAB — T3, FREE: T3 FREE: 2.8 pg/mL (ref 2.0–4.4)

## 2014-07-28 NOTE — Therapy (Signed)
[place] [facility] [address] [place], [place], [address] Phone: [phone]   Fax:  [phone]  Physical Therapy Treatment  Patient Details  Name: Joanna Ortiz MRN: [id number] Date of Birth: 09/16/1945 Referring Provider:  Sofie Hartigan, MD  Encounter Date: 07/28/2014      PT End of Session - 07/28/14 1108    Visit Number 2   Number of Visits 8   Date for PT Re-Evaluation 08/23/14   Authorization - Visit Number 2   Authorization - Number of Visits 10   PT Start Time 0941   PT Stop Time 1028   PT Time Calculation (min) 47 min   Activity Tolerance Patient limited by pain   Behavior During Therapy [facility] for tasks assessed/performed      Past Medical History  Diagnosis Date  . Crohn disease   . Thyroid disease     Past Surgical History  Procedure Laterality Date  . Abdominal hysterectomy  1987  . Thyroid surgery  1992  . Small intestine surgery  2012  . Colonoscopy  2013  . Colon surgery  06/06/2011    Resection of terminal ileum and previous ileocolonic anastomosis for recurrent Crohn's disease. 3 focal strictures identified.    There were no vitals filed for this visit.  Visit Diagnosis:  Left shoulder pain  Stiffness of left shoulder joint  Muscle weakness (generalized)      Subjective Assessment - 07/28/14 1050    Subjective Pt. reports neck is always stiff and she has been doing her HEP since starting PT Monday.  Pt. reports minimal L shoulder pain currently at rest.    Limitations Sitting;Lifting   Currently in Pain? Yes   Pain Score 3    Pain Location Shoulder   Pain Orientation Left   Pain Type Chronic pain   Pain Onset More than a month ago   Multiple Pain Sites No        OBJECTIVE:  Reviewed HEP (modified L shoulder abduction with pulley)/ Added postural ex. (scapular retraction/ pec. Stretches).  Manual tx.:  Seated STM to B UT/ low cervical region (6 min.).  Supine L shoulder  AP/PA/inf. Grade II-III mobs. 4x20 sec.  Supine cervical traction 5x with static holds as tolerated.  L/R UT and levator stretches 3x each.      Pt response for medical necessity:  Cuing for proper technique for L shoulder ex./  Pain limited and muscle guarded prior to manual tx.           PT Education - 07/28/14 1107    Education provided Yes   Education Details Postural correction/ see HEP   Person(s) Educated Patient   Methods Explanation;Demonstration;Handout   Comprehension Verbalized understanding;Returned demonstration;Verbal cues required           PT Long Term Goals - 07/26/14 1133    PT LONG TERM GOAL #1   Title Pt. I with HEP to increase L shoulder AROM to WNL as compared to R shoulder to improve pain-free mobility.     Time 4   Period Weeks   Status New   PT LONG TERM GOAL #2   Title Pt. able to lifting 10# object overhead with no c/o L shoulder pain to improve mobility.    Time 4   Period Weeks   Status New   PT LONG TERM GOAL #3   Title Pt. able to tolerate sleeping on L shoulder/side with no increase c/o pain reported.  Time 4   Period Weeks   Status New   PT LONG TERM GOAL #4   Title Pt. will decrease QuickDASH to <30% to improve self-perceived disability with UE.     Baseline 41% on 07/26/14   Time 4   Period Weeks   Status New               Plan - 07/28/14 1112    Pt will benefit from skilled therapeutic intervention in order to improve on the following deficits Hypomobility;Decreased strength;Impaired UE functional use;Pain;Decreased mobility;Decreased range of motion;Improper body mechanics   Rehab Potential Good   PT Frequency 2x / week   PT Duration 4 weeks   PT Treatment/Interventions ADLs/Self Care Home Management;Passive range of motion;Patient/family education;Cryotherapy;Electrical Stimulation;Iontophoresis 4mg /ml Dexamethasone;Therapeutic activities;Manual techniques;Therapeutic exercise;Moist Heat   PT Next Visit Plan Reassess  HEP/ progress pain-free L shoulder ROM/ check X-ray   PT Home Exercise Plan see handouts (1-2x/day).     Consulted and Agree with Plan of Care Patient        Problem List Patient Active Problem List   Diagnosis Date Noted  . Rectal bleeding 06/10/2014  . AP (abdominal pain) 12/29/2013  . Ventral hernia without obstruction or gangrene 12/18/2013  . Crohn's disease 12/18/2013    Pura Spice, PT, DPT # [phone]   07/28/2014, 11:18 AM  [place] [facility] [address] [place], [place], [address] Phone: [phone]   Fax:  [phone]

## 2014-07-30 LAB — MISC LABCORP TEST (SEND OUT)

## 2014-08-02 ENCOUNTER — Encounter: Payer: Self-pay | Admitting: Physical Therapy

## 2014-08-02 ENCOUNTER — Ambulatory Visit: Payer: Federal, State, Local not specified - PPO | Admitting: Physical Therapy

## 2014-08-02 DIAGNOSIS — M6281 Muscle weakness (generalized): Secondary | ICD-10-CM

## 2014-08-02 DIAGNOSIS — M25512 Pain in left shoulder: Secondary | ICD-10-CM

## 2014-08-02 DIAGNOSIS — M25612 Stiffness of left shoulder, not elsewhere classified: Secondary | ICD-10-CM

## 2014-08-02 NOTE — Therapy (Signed)
[place] [facility] [address] [place], [place], [address] Phone: [phone]   Fax:  [phone]  Physical Therapy Treatment  Patient Details  Name: Joanna Ortiz MRN: [id number] Date of Birth: Nov 11, 1945 Referring Provider:  Sofie Hartigan, MD  Encounter Date: 08/02/2014      PT End of Session - 08/02/14 1936    Visit Number 3   Number of Visits 8   Date for PT Re-Evaluation 08/23/14   Authorization - Visit Number 3   Authorization - Number of Visits 10   PT Start Time 0939   PT Stop Time 1031   PT Time Calculation (min) 52 min   Activity Tolerance Patient limited by pain   Behavior During Therapy [facility] for tasks assessed/performed      Past Medical History  Diagnosis Date  . Crohn disease   . Thyroid disease     Past Surgical History  Procedure Laterality Date  . Abdominal hysterectomy  1987  . Thyroid surgery  1992  . Small intestine surgery  2012  . Colonoscopy  2013  . Colon surgery  06/06/2011    Resection of terminal ileum and previous ileocolonic anastomosis for recurrent Crohn's disease. 3 focal strictures identified.    There were no vitals filed for this visit.  Visit Diagnosis:  Left shoulder pain  Stiffness of left shoulder joint  Muscle weakness (generalized)      Subjective Assessment - 08/02/14 1928    Subjective Pt. c/o 2/10 L shoulder pain.  Pt. reports a busy weekend entertaining at home for baby shower.  No sharp episodes of L shoulder pain this weekend.  Pt. compliant with HEP.    Limitations Sitting;Lifting   Patient Stated Goals Increase L shoulder ROM/ stability and decrease pain.   Currently in Pain? Yes   Pain Score 2    Pain Location Shoulder   Pain Orientation Left   Pain Onset More than a month ago   Effect of Pain on Daily Activities trying to sleep on L side.    Multiple Pain Sites No        OBJECTIVE:  Seated B UBE 3 min. Forward/ backward. Reviewed postural ex.  (scapular retraction/ pec. Stretches).  Supine B sh. AROM (horizontal abd./add./ serratus punches/ sh. Flexion)- pain tolerable range.  Nautilus: seated lat. Pull downs 30# 20x/ standing tricep ext. 20# 20x/ scapular retraction 30# 20x (cuing to prevent UT compensation).  Manual tx.: Seated STM to B UT/ low cervical region (8 min.). Supine L shoulder AP/PA/inf. Grade II-III mobs. 4x20 sec.  L shoulder ER contract-relax technique 5x each.  L shoulder AAROM all planes (pain tolerable range).       Pt response for medical necessity: Cuing for proper technique for L shoulder ex./ Pain limited ER but benefit from AP mobs./ contract-relax technique to increase ROM.         PT Long Term Goals - 07/26/14 1133    PT LONG TERM GOAL #1   Title Pt. I with HEP to increase L shoulder AROM to WNL as compared to R shoulder to improve pain-free mobility.     Time 4   Period Weeks   Status New   PT LONG TERM GOAL #2   Title Pt. able to lifting 10# object overhead with no c/o L shoulder pain to improve mobility.    Time 4   Period Weeks   Status New   PT LONG TERM GOAL #3   Title Pt.  able to tolerate sleeping on L shoulder/side with no increase c/o pain reported.    Time 4   Period Weeks   Status New   PT LONG TERM GOAL #4   Title Pt. will decrease QuickDASH to <30% to improve self-perceived disability with UE.     Baseline 41% on 07/26/14   Time 4   Period Weeks   Status New            Plan - 08/02/14 1941    Clinical Impression Statement No sharp c/o pain during manual tx./ ther.ex. but ER remains pain limited.  Moderate hypomobility/ shoulder impingement limiting full L shoulder ROM.  Proximal L biceps tenderness noted.  Moderate cuing to correct posture/ prevent UT compensation with overhead reaching.    Pt will benefit from skilled therapeutic intervention in order to improve on the following deficits Hypomobility;Decreased strength;Impaired UE functional  use;Pain;Decreased mobility;Decreased range of motion;Improper body mechanics   Rehab Potential Good   PT Frequency 2x / week   PT Duration 4 weeks   PT Treatment/Interventions ADLs/Self Care Home Management;Passive range of motion;Patient/family education;Cryotherapy;Electrical Stimulation;Iontophoresis 4mg /ml Dexamethasone;Therapeutic activities;Manual techniques;Therapeutic exercise;Moist Heat   PT Next Visit Plan Progress pain-free L shoulder ROM/ check X-ray/ add resisted ex. to HEP        Problem List Patient Active Problem List   Diagnosis Date Noted  . Rectal bleeding 06/10/2014  . AP (abdominal pain) 12/29/2013  . Ventral hernia without obstruction or gangrene 12/18/2013  . Crohn's disease 12/18/2013      Pura Spice, PT, DPT # [phone]   08/02/2014, 7:45 PM  [place] [facility] [address] [place], [place], [address] Phone: [phone]   Fax:  [phone]

## 2014-08-04 ENCOUNTER — Ambulatory Visit: Payer: Federal, State, Local not specified - PPO | Admitting: Physical Therapy

## 2014-08-04 DIAGNOSIS — M25512 Pain in left shoulder: Secondary | ICD-10-CM | POA: Diagnosis not present

## 2014-08-04 DIAGNOSIS — M6281 Muscle weakness (generalized): Secondary | ICD-10-CM

## 2014-08-04 DIAGNOSIS — M25612 Stiffness of left shoulder, not elsewhere classified: Secondary | ICD-10-CM

## 2014-08-04 NOTE — Therapy (Signed)
[place] [facility] [address] [place], [place], [address] Phone: [phone]   Fax:  [phone]  Physical Therapy Treatment  Patient Details  Name: Joanna Ortiz MRN: [id number] Date of Birth: [birth year] Referring Provider:  Sofie Hartigan, MD  Encounter Date: 08/04/2014      PT End of Session - 08/04/14 1241    Visit Number 4   Number of Visits 8   Date for PT Re-Evaluation 08/23/14   Authorization - Visit Number 4   Authorization - Number of Visits 10   PT Start Time 0926   PT Stop Time 1011   PT Time Calculation (min) 45 min   Activity Tolerance Patient limited by pain   Behavior During Therapy [facility] for tasks assessed/performed      Past Medical History  Diagnosis Date  . Crohn disease   . Thyroid disease     Past Surgical History  Procedure Laterality Date  . Abdominal hysterectomy  1987  . Thyroid surgery  1992  . Small intestine surgery  2012  . Colonoscopy  2013  . Colon surgery  06/06/2011    Resection of terminal ileum and previous ileocolonic anastomosis for recurrent Crohn's disease. 3 focal strictures identified.    There were no vitals filed for this visit.  Visit Diagnosis:  Left shoulder pain  Stiffness of left shoulder joint  Muscle weakness (generalized)      Subjective Assessment - 08/04/14 1237    Subjective Pt. reports increase L shoulder soreness yesterday after Monday PT tx. session. Pt. states soreness is better today and was probably result of strengthening ex.    Limitations Sitting;Lifting   Patient Stated Goals Increase L shoulder ROM/ stability and decrease pain.   Currently in Pain? Yes   Pain Score 4    Pain Location Shoulder   Pain Orientation Left   Multiple Pain Sites No        OBJECTIVE:  Manual tx.: STM to B UT/cervical region.  Supine traction/ L/R cervical rotn. AAROM as tolerated and UT/levator stretches with hold 2x30 sec. Each.  Supine L shoulder  AP/PA/inf. Grade II-III mobs. 4x20 sec.   There.ex.: Supine B sh. AROM (horizontal abd./add./ serratus punches/ sh. Flexion)- pain tolerable range. No Nautilus today.  See new HEP (issued RTB).     Pt response for medical necessity: Cuing for proper technique for L shoulder ex./ Pain limited ER but benefit from AP mobs./ contract-relax technique to increase ROM.         PT Education - 08/04/14 1241    Education provided Yes   Education Details HEP (see new handouts)   Person(s) Educated Patient   Methods Explanation;Demonstration;Handout   Comprehension Verbalized understanding;Returned demonstration;Verbal cues required            PT Long Term Goals - 07/26/14 1133    PT LONG TERM GOAL #1   Title Pt. I with HEP to increase L shoulder AROM to WNL as compared to R shoulder to improve pain-free mobility.     Time 4   Period Weeks   Status New   PT LONG TERM GOAL #2   Title Pt. able to lifting 10# object overhead with no c/o L shoulder pain to improve mobility.    Time 4   Period Weeks   Status New   PT LONG TERM GOAL #3   Title Pt. able to tolerate sleeping on L shoulder/side with no increase c/o pain reported.    Time  4   Period Weeks   Status New   PT LONG TERM GOAL #4   Title Pt. will decrease QuickDASH to <30% to improve self-perceived disability with UE.     Baseline 41% on 07/26/14   Time 4   Period Weeks   Status New            Plan - 08/04/14 1243    Clinical Impression Statement L shoulder ER to 28 deg. AROM after stretches/ manual tx.  Pt. remain pain limited with manual tx. and able to progress to L sh. resisted/ postural ex. with proper technique.    Pt will benefit from skilled therapeutic intervention in order to improve on the following deficits Hypomobility;Decreased strength;Impaired UE functional use;Pain;Decreased mobility;Decreased range of motion;Improper body mechanics   Rehab Potential Good   PT Frequency 2x / week   PT  Duration 4 weeks   PT Treatment/Interventions ADLs/Self Care Home Management;Passive range of motion;Patient/family education;Cryotherapy;Electrical Stimulation;Iontophoresis 4mg /ml Dexamethasone;Therapeutic activities;Manual techniques;Therapeutic exercise;Moist Heat   PT Next Visit Plan Progress pain-free L shoulder ROM/ check X-ray/ add resisted ex. to HEP   PT Home Exercise Plan see handouts (1-2x/day).  Issued new resisted HEP with RTB   Consulted and Agree with Plan of Care Patient        Problem List Patient Active Problem List   Diagnosis Date Noted  . Rectal bleeding 06/10/2014  . AP (abdominal pain) 12/29/2013  . Ventral hernia without obstruction or gangrene 12/18/2013  . Crohn's disease 12/18/2013    Pura Spice, PT, DPT # [id number]   08/04/2014, 12:56 PM  [facility] [address] [place], [place], [address] Phone: [phone]   Fax:  [phone]

## 2014-08-09 ENCOUNTER — Ambulatory Visit: Payer: Federal, State, Local not specified - PPO | Admitting: Physical Therapy

## 2014-08-09 DIAGNOSIS — M25512 Pain in left shoulder: Secondary | ICD-10-CM

## 2014-08-09 DIAGNOSIS — M6281 Muscle weakness (generalized): Secondary | ICD-10-CM

## 2014-08-09 DIAGNOSIS — M25612 Stiffness of left shoulder, not elsewhere classified: Secondary | ICD-10-CM

## 2014-08-09 NOTE — Therapy (Signed)
[place] [address] [place], [place], [address] Phone: [phone]   Fax:  [phone]  Physical Therapy Treatment  Patient Details  Name: Joanna Ortiz MRN: [id number] Date of Birth: [birth year] Referring Provider:  Sofie Hartigan, MD  Encounter Date: 08/09/2014      PT End of Session - 08/10/14 0729    Visit Number 5   Number of Visits 8   Date for PT Re-Evaluation 08/23/14   Authorization - Visit Number 5   Authorization - Number of Visits 10   PT Start Time 9518   PT Stop Time 1039   PT Time Calculation (min) 55 min   Activity Tolerance Patient limited by pain   Behavior During Therapy [facility] for tasks assessed/performed      Past Medical History  Diagnosis Date  . Crohn disease   . Thyroid disease     Past Surgical History  Procedure Laterality Date  . Abdominal hysterectomy  1987  . Thyroid surgery  1992  . Small intestine surgery  2012  . Colonoscopy  2013  . Colon surgery  06/06/2011    Resection of terminal ileum and previous ileocolonic anastomosis for recurrent Crohn's disease. 3 focal strictures identified.    There were no vitals filed for this visit.  Visit Diagnosis:  Left shoulder pain  Stiffness of left shoulder joint  Muscle weakness (generalized)      Subjective Assessment - 08/10/14 0722    Subjective Pt. reports no pain currently at rest but states while she was trying to reach behind back to fasten overalls she had severe, sharp pain in L shoulder.  Pt. remains active with daily tasks/ exercises.       Limitations Sitting;Lifting   Patient Stated Goals Increase L shoulder ROM/ stability and decrease pain.   Currently in Pain? Yes   Pain Score 3    Pain Location Shoulder   Pain Orientation Left      OBJECTIVE: Manual tx.: STM to B UT/cervical region with gentle neck stretches. Supine traction/ L/R cervical rotn. AAROM as tolerated and UT/levator stretches with hold 2x30  sec. Each. Supine L shoulder AP/PA/inf. Grade II-III mobs. 4x20 sec. There.ex.: Supine B sh. AROM (horizontal abd./add./ serratus punches/ sh. Flexion)- pain tolerable range. Standing Nautilus: 40# lat. Pull downs/ 30# tricep ext./ 40# scap. Retraction/ 30# sh. Ext. 20x each.  Reviewed HEP (reissued RTB).    Pt response for medical necessity: Cuing for proper technique for L shoulder ex./ Pain limited ER but benefit from AP mobs./ improving L sh. ER.        PT Long Term Goals - 07/26/14 1133    PT LONG TERM GOAL #1   Title Pt. I with HEP to increase L shoulder AROM to WNL as compared to R shoulder to improve pain-free mobility.     Time 4   Period Weeks   Status New   PT LONG TERM GOAL #2   Title Pt. able to lifting 10# object overhead with no c/o L shoulder pain to improve mobility.    Time 4   Period Weeks   Status New   PT LONG TERM GOAL #3   Title Pt. able to tolerate sleeping on L shoulder/side with no increase c/o pain reported.    Time 4   Period Weeks   Status New   PT LONG TERM GOAL #4   Title Pt. will decrease QuickDASH to <30% to improve self-perceived disability with UE.  Baseline 41% on 07/26/14   Time 4   Period Weeks   Status New            Plan - 08/10/14 0730    Clinical Impression Statement Increase L shoulder ER to 42 deg. after manual tx. but pain limited/ tight end-feel with PROM/ sh. mobs.  Pt. able to progress with resisted ther.ex.     Pt will benefit from skilled therapeutic intervention in order to improve on the following deficits Hypomobility;Decreased strength;Impaired UE functional use;Pain;Decreased mobility;Decreased range of motion;Improper body mechanics   Rehab Potential Good   PT Frequency 2x / week   PT Duration 4 weeks   PT Treatment/Interventions ADLs/Self Care Home Management;Passive range of motion;Patient/family education;Cryotherapy;Electrical Stimulation;Iontophoresis 4mg /ml Dexamethasone;Therapeutic  activities;Manual techniques;Therapeutic exercise;Moist Heat   PT Next Visit Plan Progress pain-free L shoulder ROM/ progress resisted ex. and strength training.    Consulted and Agree with Plan of Care Patient        Problem List Patient Active Problem List   Diagnosis Date Noted  . Rectal bleeding 06/10/2014  . AP (abdominal pain) 12/29/2013  . Ventral hernia without obstruction or gangrene 12/18/2013  . Crohn's disease 12/18/2013    Pura Spice, PT, DPT # [id number]   08/10/2014, 7:34 AM  [facility] [address] [place], [place], [address] Phone: [phone]   Fax:  [phone]

## 2014-08-11 ENCOUNTER — Encounter: Payer: Federal, State, Local not specified - PPO | Admitting: Physical Therapy

## 2014-08-12 ENCOUNTER — Ambulatory Visit: Payer: Federal, State, Local not specified - PPO | Admitting: Physical Therapy

## 2014-08-12 DIAGNOSIS — M25512 Pain in left shoulder: Secondary | ICD-10-CM | POA: Diagnosis not present

## 2014-08-12 DIAGNOSIS — M25612 Stiffness of left shoulder, not elsewhere classified: Secondary | ICD-10-CM

## 2014-08-12 DIAGNOSIS — M6281 Muscle weakness (generalized): Secondary | ICD-10-CM

## 2014-08-12 NOTE — Therapy (Signed)
[facility] [address] [place], [place], [address] Phone: [phone]   Fax:  [phone]  Physical Therapy Treatment  Patient Details  Name: Joanna Ortiz MRN: [id number] Date of Birth: 10-21-1945 Referring Provider:  Sofie Hartigan, MD  Encounter Date: 08/12/2014      PT End of Session - 08/13/14 0651    Visit Number 6   Number of Visits 8   Date for PT Re-Evaluation 08/23/14   Authorization - Visit Number 6   Authorization - Number of Visits 10   PT Start Time 0813   PT Stop Time 0904   PT Time Calculation (min) 51 min   Activity Tolerance Patient limited by pain   Behavior During Therapy [facility] for tasks assessed/performed      Past Medical History  Diagnosis Date  . Crohn disease   . Thyroid disease     Past Surgical History  Procedure Laterality Date  . Abdominal hysterectomy  1987  . Thyroid surgery  1992  . Small intestine surgery  2012  . Colonoscopy  2013  . Colon surgery  06/06/2011    Resection of terminal ileum and previous ileocolonic anastomosis for recurrent Crohn's disease. 3 focal strictures identified.    There were no vitals filed for this visit.  Visit Diagnosis:  Left shoulder pain  Stiffness of left shoulder joint  Muscle weakness (generalized)      Subjective Assessment - 08/13/14 0646    Subjective Pt. c/o 2/10 L shoulder pain and states neck is really stiff this morning.  Pt. reports compliance with resisted HEP.     Limitations Sitting;Lifting   Patient Stated Goals Increase L shoulder ROM/ stability and decrease pain.   Currently in Pain? Yes   Pain Score 2    Pain Location Shoulder   Pain Orientation Left       OBJECTIVE: Manual tx.: STM to B UT/cervical region with gentle neck stretches (rotn./ lateral flexion). Supine traction/ L/R cervical rotn. AAROM as tolerated and UT/levator stretches with hold 2x30 sec. Each. Supine L shoulder AP/PA/inf. Grade II-III  mobs. 4x20 sec. There.ex.: Supine B sh. AROM (horizontal abd./add./ serratus punches/ sh. Flexion)- pain tolerable range. Standing Nautilus: 40# lat. Pull downs/ 30# tricep ext./ 40# scap. Retraction/ 30# sh. Ext. 20x each. Seated wand sh. Flexion/ abd./ ER with PT assist for scapular control/ proper movement.  Ice to L shoulder after tx.  Pt response for medical necessity: Cuing for proper technique for L shoulder ex./ Pain limited ER but benefit from AP mobs./ improving L sh. ER but limited progress with cervical AROM.        PT Long Term Goals - 07/26/14 1133    PT LONG TERM GOAL #1   Title Pt. I with HEP to increase L shoulder AROM to WNL as compared to R shoulder to improve pain-free mobility.     Time 4   Period Weeks   Status New   PT LONG TERM GOAL #2   Title Pt. able to lifting 10# object overhead with no c/o L shoulder pain to improve mobility.    Time 4   Period Weeks   Status New   PT LONG TERM GOAL #3   Title Pt. able to tolerate sleeping on L shoulder/side with no increase c/o pain reported.    Time 4   Period Weeks   Status New   PT LONG TERM GOAL #4   Title Pt. will decrease QuickDASH to <30%  to improve self-perceived disability with UE.     Baseline 41% on 07/26/14   Time 4   Period Weeks   Status New               Plan - 08/13/14 7342    Clinical Impression Statement Progressing L shoulder stability with supine rhythmic stabilization/ resisted ex. on Nautilus/ RTB.  No episodes of sharp L anterior shoulder pain during ther.ex.  Significant cervical rotn./ lateral flexion tightness and joint stiffness.    Pt will benefit from skilled therapeutic intervention in order to improve on the following deficits Hypomobility;Decreased strength;Impaired UE functional use;Pain;Decreased mobility;Decreased range of motion;Improper body mechanics   Rehab Potential Good   PT Frequency 2x / week   PT Duration 4 weeks   PT Treatment/Interventions  ADLs/Self Care Home Management;Passive range of motion;Patient/family education;Cryotherapy;Electrical Stimulation;Iontophoresis 4mg /ml Dexamethasone;Therapeutic activities;Manual techniques;Therapeutic exercise;Moist Heat   PT Next Visit Plan Progress pain-free L shoulder ROM/ progress resisted ex. and strength training.         Problem List Patient Active Problem List   Diagnosis Date Noted  . Rectal bleeding 06/10/2014  . AP (abdominal pain) 12/29/2013  . Ventral hernia without obstruction or gangrene 12/18/2013  . Crohn's disease 12/18/2013    Pura Spice, PT, DPT # [id number]   08/13/2014, 7:00 AM  [facility] [address] [place], [place], [address] Phone: [phone]   Fax:  [phone]

## 2014-08-16 ENCOUNTER — Ambulatory Visit: Payer: Federal, State, Local not specified - PPO | Admitting: Physical Therapy

## 2014-08-16 DIAGNOSIS — M25512 Pain in left shoulder: Secondary | ICD-10-CM

## 2014-08-16 DIAGNOSIS — M6281 Muscle weakness (generalized): Secondary | ICD-10-CM

## 2014-08-16 DIAGNOSIS — M25612 Stiffness of left shoulder, not elsewhere classified: Secondary | ICD-10-CM

## 2014-08-16 NOTE — Therapy (Signed)
[place] [facility] [address] [place], [place], [address] Phone: [phone]   Fax:  [phone]  Physical Therapy Treatment  Patient Details  Name: Joanna Ortiz MRN: [id number] Date of Birth: [birth year] Referring Provider:  Sofie Hartigan, MD  Encounter Date: 08/16/2014      PT End of Session - 08/16/14 1234    Visit Number 7   Number of Visits 8   Date for PT Re-Evaluation 08/23/14   Authorization - Visit Number 7   Authorization - Number of Visits 10   PT Start Time 0944   PT Stop Time 1036   PT Time Calculation (min) 52 min   Activity Tolerance Patient limited by pain   Behavior During Therapy [facility] for tasks assessed/performed      Past Medical History  Diagnosis Date  . Crohn disease   . Thyroid disease     Past Surgical History  Procedure Laterality Date  . Abdominal hysterectomy  1987  . Thyroid surgery  1992  . Small intestine surgery  2012  . Colonoscopy  2013  . Colon surgery  06/06/2011    Resection of terminal ileum and previous ileocolonic anastomosis for recurrent Crohn's disease. 3 focal strictures identified.    There were no vitals filed for this visit.  Visit Diagnosis:  Left shoulder pain  Stiffness of left shoulder joint  Muscle weakness (generalized)      Subjective Assessment - 08/16/14 1233    Subjective Pt. reports 4/10 L shoulder pain currently at rest.  Pt. reports being active this weekend and used L shoulder a lot with cooking/ housework.     Limitations Sitting;Lifting   Patient Stated Goals Increase L shoulder ROM/ stability and decrease pain.   Currently in Pain? Yes   Pain Score 4    Pain Location Shoulder   Pain Orientation Left        OBJECTIVE: There.ex.: Seated B UBE 2 min. F/b (warm-up/no charge). Supine and seated B sh. AROM (horizontal abd./add./ serratus punches/ sh. Flexion)- pain tolerable range. Standing Nautilus: 50# lat. Pull downs/ 30# tricep ext./  40# scap. Retraction/ 30# sh. Ext. With wand/ 20# B shoulder adduction with handles 20x each. Manual tx.: STM to B UT/cervical region with gentle neck stretches (rotn./ lateral flexion). Supine traction/ L/R cervical rotn. AAROM as tolerated and UT/levator stretches with hold 2x30 sec. Each. Supine L shoulder AP/PA/inf. Grade II-III mobs. 4x20 sec.Ice to L shoulder after tx.     Pt response for medical necessity: Pain limited ER with benefit from AP mobs./ improving L sh. ER but limited progress with cervical AROM.        PT Long Term Goals - 07/26/14 1133    PT LONG TERM GOAL #1   Title Pt. I with HEP to increase L shoulder AROM to WNL as compared to R shoulder to improve pain-free mobility.     Time 4   Period Weeks   Status New   PT LONG TERM GOAL #2   Title Pt. able to lifting 10# object overhead with no c/o L shoulder pain to improve mobility.    Time 4   Period Weeks   Status New   PT LONG TERM GOAL #3   Title Pt. able to tolerate sleeping on L shoulder/side with no increase c/o pain reported.    Time 4   Period Weeks   Status New   PT LONG TERM GOAL #4   Title Pt. will decrease QuickDASH  to <30% to improve self-perceived disability with UE.     Baseline 41% on 07/26/14   Time 4   Period Weeks   Status New               Plan - 08/16/14 1236    Clinical Impression Statement 2 episodes of L shoulder pain after supine manual tx. while trying to relax musculature.  Increase L shoulder ER to 48 deg. (limited by pain) in supine position.  Moderate L shoulder IR/ER/flexion muscle weaeknss.  L posterior shoulder capsule tightness.     Pt will benefit from skilled therapeutic intervention in order to improve on the following deficits Hypomobility;Decreased strength;Impaired UE functional use;Pain;Decreased mobility;Decreased range of motion;Improper body mechanics   Rehab Potential Good   PT Frequency 2x / week   PT Duration 4 weeks   PT  Treatment/Interventions ADLs/Self Care Home Management;Passive range of motion;Patient/family education;Cryotherapy;Electrical Stimulation;Iontophoresis 4mg /ml Dexamethasone;Therapeutic activities;Manual techniques;Therapeutic exercise;Moist Heat   PT Next Visit Plan Progress pain-free L shoulder ROM/ progress resisted ex. and strength training.   CHECK GOALS/ RECERT vs. DISCHARGE   Consulted and Agree with Plan of Care Patient        Problem List Patient Active Problem List   Diagnosis Date Noted  . Rectal bleeding 06/10/2014  . AP (abdominal pain) 12/29/2013  . Ventral hernia without obstruction or gangrene 12/18/2013  . Crohn's disease 12/18/2013    Pura Spice, PT, DPT # [id number]   08/16/2014, 12:39 PM  [place] [facility] [address] [place], [place], [address] Phone: [phone]   Fax:  [phone]

## 2014-08-18 ENCOUNTER — Ambulatory Visit: Payer: Federal, State, Local not specified - PPO | Admitting: Physical Therapy

## 2014-08-18 DIAGNOSIS — M25612 Stiffness of left shoulder, not elsewhere classified: Secondary | ICD-10-CM

## 2014-08-18 DIAGNOSIS — M25512 Pain in left shoulder: Secondary | ICD-10-CM

## 2014-08-18 DIAGNOSIS — M6281 Muscle weakness (generalized): Secondary | ICD-10-CM

## 2014-08-18 NOTE — Therapy (Signed)
[facility] [address] [place], [place], [address] Phone: [phone]   Fax:  [phone]  Physical Therapy Treatment  Patient Details  Name: Joanna Ortiz MRN: [id number] Date of Birth: 08-Sep-1945 Referring Provider:  Sofie Hartigan, MD  Encounter Date: 08/18/2014      PT End of Session - 08/18/14 1612    Visit Number 8   Number of Visits 8   Date for PT Re-Evaluation 08/23/14   Authorization - Visit Number 8   Authorization - Number of Visits 10   PT Start Time [phone]   PT Stop Time 1025   PT Time Calculation (min) 47 min   Activity Tolerance Patient limited by pain   Behavior During Therapy [facility] for tasks assessed/performed      Past Medical History  Diagnosis Date  . Crohn disease   . Thyroid disease     Past Surgical History  Procedure Laterality Date  . Abdominal hysterectomy  1987  . Thyroid surgery  1992  . Small intestine surgery  2012  . Colonoscopy  2013  . Colon surgery  06/06/2011    Resection of terminal ileum and previous ileocolonic anastomosis for recurrent Crohn's disease. 3 focal strictures identified.    There were no vitals filed for this visit.  Visit Diagnosis:  Left shoulder pain  Stiffness of left shoulder joint  Muscle weakness (generalized)      Subjective Assessment - 08/18/14 1606    Subjective Pt. states L shoulder has occasional pain but neck is really stiff/ hurting today.  Pt. reports feeling really good in neck after last PT tx. but the pain returned by the next day.  Pt. is planning on scheduling appt. with ortho. MD at this time and PT agrees.     Limitations Sitting;Lifting   Patient Stated Goals Increase L shoulder ROM/ stability and decrease pain.   Currently in Pain? Yes   Pain Score 4    Pain Location Shoulder   Pain Orientation Left   Pain Type Chronic pain   Multiple Pain Sites No      QuickDASH: 38.6%    OBJECTIVE: There.ex.: Seated B UBE 2  min. F/b (warm-up/no charge). Supine and seated B sh. AROM (horizontal abd./add./ serratus punches/ sh. Flexion)- pain tolerable range. No Nautilus ex. Today.  Supine L shoulder isometrics 5x all planes (pain tolerable).  L shoulder rhythmic stabs 3x20 sec. All planes.  Manual tx.: STM to B UT/cervical region with gentle neck stretches (rotn./ lateral flexion). Supine traction/ L/R cervical rotn. AAROM as tolerated and UT/levator stretches with hold 2x30 sec. Each. Supine L shoulder AP/PA/inf. Grade II-III mobs. 4x20 sec.Ice to L shoulder at home.    Pt response for medical necessity: Pain limited ER with benefit from AP mobs./ significant cervical hypomobility in cervical spine.          PT Long Term Goals - 08/19/14 1000    PT LONG TERM GOAL #1   Title Pt. I with HEP to increase L shoulder AROM to WNL as compared to R shoulder to improve pain-free mobility.     Time 4   Period Weeks   Status Partially Met   PT LONG TERM GOAL #2   Title Pt. able to lifting 10# object overhead with no c/o L shoulder pain to improve mobility.    Time 4   Period Weeks   Status Partially Met   PT LONG TERM GOAL #3   Title Pt. able to  tolerate sleeping on L shoulder/side with no increase c/o pain reported.    Time 4   Period Weeks   Status Not Met   PT LONG TERM GOAL #4   Title Pt. will decrease QuickDASH to <30% to improve self-perceived disability with UE.     Baseline 41% on 07/26/14   Status Not Met             Plan - 08/18/14 1614    Clinical Impression Statement Significant cervical hypomobility (all planes) and 1 episode of L shoulder pain with overhead reaching.  Pt. has shown minimal improvement with PT over past couple of weeks due to pain/ hypomobility of cervical spine.  Pt. will benefit from ortho MD referral at this time.  Pt. on hold with PT until after MD f/u.     Pt will benefit from skilled therapeutic intervention in order to improve on the following deficits  Hypomobility;Decreased strength;Impaired UE functional use;Pain;Decreased mobility;Decreased range of motion;Improper body mechanics   Rehab Potential Good   PT Frequency 2x / week   PT Duration 4 weeks   PT Treatment/Interventions ADLs/Self Care Home Management;Passive range of motion;Patient/family education;Cryotherapy;Electrical Stimulation;Iontophoresis 4mg /ml Dexamethasone;Therapeutic activities;Manual techniques;Therapeutic exercise;Moist Heat   PT Next Visit Plan Pt. on hold pending MD referral/ F/u visit at this time.     PT Home Exercise Plan continue with daily stretches/ HEP at this time.    Consulted and Agree with Plan of Care Patient          G-Codes - 2014-09-08 1001    Functional Assessment Tool Used QuickDASH/ clinical judgement/ pain/ ROM   Functional Limitation Carrying, moving and handling objects   Carrying, Moving and Handling Objects Current Status [id number]) At least 20 percent but less than 40 percent impaired, limited or restricted   Carrying, Moving and Handling Objects Goal Status (S9373) At least 1 percent but less than 20 percent impaired, limited or restricted   Carrying, Moving and Handling Objects Discharge Status [phone]) At least 20 percent but less than 40 percent impaired, limited or restricted      Problem List Patient Active Problem List   Diagnosis Date Noted  . Rectal bleeding 06/10/2014  . AP (abdominal pain) 12/29/2013  . Ventral hernia without obstruction or gangrene 12/18/2013  . Crohn's disease 12/18/2013   Pura Spice, PT, DPT # [id number]   09-08-14, 10:03 AM  [facility] [address] [place], [place], [address] Phone: [phone]   Fax:  [phone]

## 2014-09-04 ENCOUNTER — Encounter: Payer: Self-pay | Admitting: *Deleted

## 2014-09-04 ENCOUNTER — Other Ambulatory Visit: Payer: Self-pay

## 2014-09-04 ENCOUNTER — Emergency Department
Admission: EM | Admit: 2014-09-04 | Discharge: 2014-09-04 | Disposition: A | Discharge status: Home / Self Care | Payer: Federal, State, Local not specified - PPO | Attending: Emergency Medicine | Admitting: Emergency Medicine

## 2014-09-04 ENCOUNTER — Emergency Department: Payer: Federal, State, Local not specified - PPO

## 2014-09-04 DIAGNOSIS — R4781 Slurred speech: Secondary | ICD-10-CM | POA: Diagnosis present

## 2014-09-04 DIAGNOSIS — R519 Headache, unspecified: Secondary | ICD-10-CM

## 2014-09-04 DIAGNOSIS — R51 Headache: Secondary | ICD-10-CM | POA: Diagnosis not present

## 2014-09-04 DIAGNOSIS — Z87891 Personal history of nicotine dependence: Secondary | ICD-10-CM | POA: Insufficient documentation

## 2014-09-04 DIAGNOSIS — Z79899 Other long term (current) drug therapy: Secondary | ICD-10-CM | POA: Diagnosis not present

## 2014-09-04 LAB — COMPREHENSIVE METABOLIC PANEL
ALK PHOS: 85 U/L (ref 38–126)
ALT: 30 U/L (ref 14–54)
ANION GAP: 8 (ref 5–15)
AST: 27 U/L (ref 15–41)
Albumin: 3.7 g/dL (ref 3.5–5.0)
BUN: 9 mg/dL (ref 6–20)
CO2: 27 mmol/L (ref 22–32)
CREATININE: 0.74 mg/dL (ref 0.44–1.00)
Calcium: 8.6 mg/dL — ABNORMAL LOW (ref 8.9–10.3)
Chloride: 106 mmol/L (ref 101–111)
GLUCOSE: 168 mg/dL — AB (ref 65–99)
Potassium: 3.7 mmol/L (ref 3.5–5.1)
SODIUM: 141 mmol/L (ref 135–145)
TOTAL PROTEIN: 7.2 g/dL (ref 6.5–8.1)
Total Bilirubin: 0.3 mg/dL (ref 0.3–1.2)

## 2014-09-04 LAB — URINALYSIS COMPLETE WITH MICROSCOPIC (ARMC ONLY)
BILIRUBIN URINE: NEGATIVE
GLUCOSE, UA: NEGATIVE mg/dL
Ketones, ur: NEGATIVE mg/dL
Nitrite: NEGATIVE
Protein, ur: NEGATIVE mg/dL
Specific Gravity, Urine: 1.018 (ref 1.005–1.030)
pH: 5 (ref 5.0–8.0)

## 2014-09-04 LAB — CBC
HEMATOCRIT: 37.3 % (ref 35.0–47.0)
HEMOGLOBIN: 12.6 g/dL (ref 12.0–16.0)
MCH: 29.5 pg (ref 26.0–34.0)
MCHC: 33.8 g/dL (ref 32.0–36.0)
MCV: 87.2 fL (ref 80.0–100.0)
Platelets: 182 10*3/uL (ref 150–440)
RBC: 4.28 MIL/uL (ref 3.80–5.20)
RDW: 14.8 % — ABNORMAL HIGH (ref 11.5–14.5)
WBC: 9.7 10*3/uL (ref 3.6–11.0)

## 2014-09-04 LAB — DIFFERENTIAL
BASOS ABS: 0 10*3/uL (ref 0–0.1)
Basophils Relative: 0 %
Eosinophils Absolute: 0.4 10*3/uL (ref 0–0.7)
Eosinophils Relative: 4 %
Lymphocytes Relative: 25 %
Lymphs Abs: 2.4 10*3/uL (ref 1.0–3.6)
MONO ABS: 0.7 10*3/uL (ref 0.2–0.9)
Monocytes Relative: 7 %
Neutro Abs: 6.2 10*3/uL (ref 1.4–6.5)
Neutrophils Relative %: 64 %

## 2014-09-04 LAB — TROPONIN I

## 2014-09-04 LAB — PROTIME-INR
INR: 0.9
Prothrombin Time: 12.4 seconds (ref 11.4–15.0)

## 2014-09-04 LAB — GLUCOSE, CAPILLARY: Glucose-Capillary: 173 mg/dL — ABNORMAL HIGH (ref 65–99)

## 2014-09-04 LAB — APTT: APTT: 27 s (ref 24–36)

## 2014-09-04 MED ORDER — GADOBENATE DIMEGLUMINE 529 MG/ML IV SOLN
20.0000 mL | Freq: Once | INTRAVENOUS | Status: AC | PRN
  Administered 2014-09-04: 18 mL via INTRAVENOUS

## 2014-09-04 NOTE — ED Provider Notes (Signed)
[facility] Emergency Department Provider Note  ____________________________________________  Time seen:   I have reviewed the triage vital signs and the nursing notes.   HISTORY  Chief Complaint Code Stroke      HPI Joanna Ortiz is a 69 y.o. female presents with headache 3 days. Headache awoke the patient from sleep tonight she admits to dizziness at time of awakening and slurred speech patient also admits to tremor in her right arm which is new. Current pain score is 6 out of 10     Past Medical History  Diagnosis Date  . Crohn disease   . Thyroid disease     Patient Active Problem List   Diagnosis Date Noted  . Rectal bleeding 06/10/2014  . AP (abdominal pain) 12/29/2013  . Ventral hernia without obstruction or gangrene 12/18/2013  . Crohn's disease 12/18/2013    Past Surgical History  Procedure Laterality Date  . Abdominal hysterectomy  1987  . Thyroid surgery  1992  . Small intestine surgery  2012  . Colonoscopy  2013  . Colon surgery  06/06/2011    Resection of terminal ileum and previous ileocolonic anastomosis for recurrent Crohn's disease. 3 focal strictures identified.    Current Outpatient Rx  Name  Route  Sig  Dispense  Refill  . cholestyramine (QUESTRAN) 4 G packet   Oral   Take 4 g by mouth 2 (two) times daily as needed (stomach problems).       12   . colestipol (COLESTID) 1 G tablet   Oral   Take 1 g by mouth daily as needed (for diarrhea).          . cyanocobalamin (,VITAMIN B-12,) 1000 MCG/ML injection   Intramuscular   Inject 1,000 mcg into the muscle every 30 (thirty) days.         Marland Kitchen HUMIRA PEN 40 MG/0.8ML PNKT   Subcutaneous   Inject 40 mg into the skin every 14 (fourteen) days.          . Multiple Vitamin (MULTIVITAMIN) tablet   Oral   Take 1 tablet by mouth daily.         . Omega-3 Fatty Acids (FISH OIL) 1000 MG CAPS   Oral   Take 1,000 mg by mouth daily.          Marland Kitchen SYNTHROID 100  MCG tablet   Oral   Take 100 mcg by mouth daily before breakfast.       11     Dispense as written.   . thyroid (ARMOUR) 30 MG tablet   Oral   Take 30 mg by mouth every morning.         . TURMERIC CURCUMIN PO   Oral   Take 1 capsule by mouth daily.           Allergies Erythromycin; Hydromorphone; Cefuroxime; Ciprocinonide; Codeine; Floxin; Keflex; Levofloxacin; Sulfa antibiotics; and Ciprofloxacin  History reviewed. No pertinent family history.  Social History History  Substance Use Topics  . Smoking status: Former Smoker -- 1.00 packs/day for 12 years    Types: Cigarettes  . Smokeless tobacco: Not on file  . Alcohol Use: No    Review of Systems  Constitutional: Negative for fever. Eyes: Negative for visual changes. ENT: Negative for sore throat. Cardiovascular: Negative for chest pain. Respiratory: Negative for shortness of breath. Gastrointestinal: Negative for abdominal pain, vomiting and diarrhea. Genitourinary: Negative for dysuria. Musculoskeletal: Negative for back pain. Skin: Negative for rash. Neurological: Positive for headaches,  and slurred speech   10-point ROS otherwise negative.  ____________________________________________   PHYSICAL EXAM:  VITAL SIGNS: ED Triage Vitals  Enc Vitals Group     BP 09/04/14 0436 183/108 mmHg     Pulse Rate 09/04/14 0436 91     Resp 09/04/14 0436 20     Temp 09/04/14 0436 98 F (36.7 C)     Temp Source 09/04/14 0436 Oral     SpO2 09/04/14 0436 97 %     Weight 09/04/14 0436 182 lb (82.555 kg)     Height 09/04/14 0436 5\' 7"  (1.702 m)     Head Cir --      Peak Flow --      Pain Score 09/04/14 0438 3     Pain Loc --      Pain Edu? --      Excl. in [place]? --      Constitutional: Alert and oriented. Well appearing and in no distress. Eyes: Conjunctivae are normal. PERRL. Normal extraocular movements. ENT   Head: Normocephalic and atraumatic.   Nose: No congestion/rhinnorhea.   Mouth/Throat:  Mucous membranes are moist.   Neck: No stridor. Cardiovascular: Normal rate, regular rhythm. Normal and symmetric distal pulses are present in all extremities. No murmurs, rubs, or gallops. Respiratory: Normal respiratory effort without tachypnea nor retractions. Breath sounds are clear and equal bilaterally. No wheezes/rales/rhonchi. Gastrointestinal: Soft and nontender. No distention. There is no CVA tenderness. Genitourinary: deferred Musculoskeletal: Nontender with normal range of motion in all extremities. No joint effusions.  No lower extremity tenderness nor edema. Neurologic:  Normal speech and language. No gross focal neurologic deficits are appreciated. Speech is normal.  Skin:  Skin is warm, dry and intact. No rash noted. Psychiatric: Mood and affect are normal. Speech and behavior are normal. Patient exhibits appropriate insight and judgment.  ____________________________________________    LABS (pertinent positives/negatives)    ____________________________________________   EKG    ____________________________________________    RADIOLOGY  CT head: IMPRESSION: No acute intracranial process; normal noncontrast CT head for age.   Electronically Signed By: Elon Alas M.D. On: 09/04/2014 05:38     INITIAL IMPRESSION / ASSESSMENT AND PLAN / ED COURSE  Pertinent labs & imaging results that were available during my care of the patient were reviewed by me and considered in my medical decision making (see chart for details).    ____________________________________________   FINAL CLINICAL IMPRESSION(S) / ED DIAGNOSES  Final diagnoses:  Slurred speech  Headache      Gregor Hams, MD 09/07/14 [phone]

## 2014-09-04 NOTE — ED Notes (Signed)
Patient transported to MRI 

## 2014-09-04 NOTE — ED Notes (Signed)
Pt reports headache x 3 days. Headache woke her tonight from sleep. Pt also reports dizziness tonight. Pt also has noted swelling in bilateral lower arms which pt reports " Has been that way for a long time, I just thought I was fat."

## 2014-09-04 NOTE — ED Provider Notes (Signed)
-----------------------------------------   8:46 AM on 09/04/2014 -----------------------------------------  Care was assumed from Dr. Owens Shark at 7 AM pending MRI brain. If unremarkable, anticipate discharge home as the patient's symptoms have resolved.  ----------------------------------------- 12:53 PM on 09/04/2014 -----------------------------------------  MRI brain negative for any acute intracranial abnormality. Patient with continued resolution of symptoms. Discussed discharge with return precautions and close PCP follow-up. Urinalysis with trace leuk esterase but minimal white blood cells and rare bacteria so will send urine culture and not treat with any antibiotics at this time. Patient and her family at bedside are comfortable with the discharge plan.  Joanne Gavel, MD 09/04/14 1255

## 2014-09-04 NOTE — ED Notes (Signed)
Pt presents w/ slurred speech and HTN w/ no hx of either. Pt has R sided tremor that is new. Pt c/o headache since yesterday intermittently and woke pt from sleep at 0330

## 2014-09-04 NOTE — ED Notes (Signed)
Patient resting in stretcher. Respirations even and unlabored. No obvious distress. Cardiac monitor in place. No needs/concerns verbalized at this time. Call bell within reach. Encouraged to call with needs. Will continue to monitor. 

## 2014-09-05 LAB — URINE CULTURE: Special Requests: NORMAL

## 2014-09-06 ENCOUNTER — Encounter: Payer: Self-pay | Admitting: Emergency Medicine

## 2014-09-06 ENCOUNTER — Emergency Department: Payer: Federal, State, Local not specified - PPO

## 2014-09-06 ENCOUNTER — Emergency Department
Admission: EM | Admit: 2014-09-06 | Discharge: 2014-09-06 | Disposition: A | Discharge status: Home / Self Care | Payer: Federal, State, Local not specified - PPO | Attending: Emergency Medicine | Admitting: Emergency Medicine

## 2014-09-06 ENCOUNTER — Other Ambulatory Visit: Payer: Self-pay

## 2014-09-06 DIAGNOSIS — R42 Dizziness and giddiness: Secondary | ICD-10-CM | POA: Diagnosis not present

## 2014-09-06 DIAGNOSIS — Z87891 Personal history of nicotine dependence: Secondary | ICD-10-CM | POA: Insufficient documentation

## 2014-09-06 DIAGNOSIS — R531 Weakness: Secondary | ICD-10-CM | POA: Diagnosis present

## 2014-09-06 LAB — CBC WITH DIFFERENTIAL/PLATELET
BASOS ABS: 0 10*3/uL (ref 0–0.1)
BASOS PCT: 0 %
Eosinophils Absolute: 0.3 10*3/uL (ref 0–0.7)
Eosinophils Relative: 3 %
HCT: 37.8 % (ref 35.0–47.0)
HEMOGLOBIN: 12.6 g/dL (ref 12.0–16.0)
LYMPHS ABS: 1.8 10*3/uL (ref 1.0–3.6)
LYMPHS PCT: 18 %
MCH: 29 pg (ref 26.0–34.0)
MCHC: 33.4 g/dL (ref 32.0–36.0)
MCV: 86.8 fL (ref 80.0–100.0)
Monocytes Absolute: 0.7 10*3/uL (ref 0.2–0.9)
Monocytes Relative: 7 %
Neutro Abs: 7.1 10*3/uL — ABNORMAL HIGH (ref 1.4–6.5)
Neutrophils Relative %: 72 %
PLATELETS: 181 10*3/uL (ref 150–440)
RBC: 4.36 MIL/uL (ref 3.80–5.20)
RDW: 14.3 % (ref 11.5–14.5)
WBC: 9.9 10*3/uL (ref 3.6–11.0)

## 2014-09-06 LAB — COMPREHENSIVE METABOLIC PANEL
ALT: 29 U/L (ref 14–54)
AST: 26 U/L (ref 15–41)
Albumin: 3.7 g/dL (ref 3.5–5.0)
Alkaline Phosphatase: 78 U/L (ref 38–126)
Anion gap: 8 (ref 5–15)
BILIRUBIN TOTAL: 0.5 mg/dL (ref 0.3–1.2)
BUN: 11 mg/dL (ref 6–20)
CALCIUM: 8.4 mg/dL — AB (ref 8.9–10.3)
CO2: 25 mmol/L (ref 22–32)
CREATININE: 0.76 mg/dL (ref 0.44–1.00)
Chloride: 106 mmol/L (ref 101–111)
GFR calc Af Amer: >60 mL/min (ref >60)
Glucose, Bld: 140 mg/dL — ABNORMAL HIGH (ref 65–99)
Potassium: 3.6 mmol/L (ref 3.5–5.1)
Sodium: 139 mmol/L (ref 135–145)
Total Protein: 7.1 g/dL (ref 6.5–8.1)

## 2014-09-06 LAB — TROPONIN I: Troponin I: <0.03 ng/mL (ref <0.031)

## 2014-09-06 LAB — TSH: TSH: 0.462 u[IU]/mL (ref 0.350–4.500)

## 2014-09-06 MED ORDER — SODIUM CHLORIDE 0.9 % IV SOLN
Freq: Once | INTRAVENOUS | Status: AC
  Administered 2014-09-06: 11:00:00 via INTRAVENOUS

## 2014-09-06 NOTE — Discharge Instructions (Signed)

## 2014-09-06 NOTE — ED Provider Notes (Signed)
[facility] Emergency Department Provider Note     Time seen: ----------------------------------------- 10:01 AM on 09/06/2014 -----------------------------------------    I have reviewed the triage vital signs and the nursing notes.   HISTORY  Chief Complaint Weakness    HPI Joanna Ortiz is a 69 y.o. female who presents ER for heart palpitations, heart pounding and feeling lightheaded and near syncopal. She denies history of same, onset was this morning.Marland Kitchen Recently seen here for severe headache and had a normal brain MRI. She denies any fevers chills, chest pain shortness of breath. Nausea vomiting or diarrhea. Reports normal by mouth intake, does not describe room spinning sensation. She also denies anxiety. Symptoms are mild this time.   Past Medical History  Diagnosis Date  . Crohn disease   . Thyroid disease     Patient Active Problem List   Diagnosis Date Noted  . Rectal bleeding 06/10/2014  . AP (abdominal pain) 12/29/2013  . Ventral hernia without obstruction or gangrene 12/18/2013  . Crohn's disease 12/18/2013    Past Surgical History  Procedure Laterality Date  . Abdominal hysterectomy  1987  . Thyroid surgery  1992  . Small intestine surgery  2012  . Colonoscopy  2013  . Colon surgery  06/06/2011    Resection of terminal ileum and previous ileocolonic anastomosis for recurrent Crohn's disease. 3 focal strictures identified.    Allergies Erythromycin; Hydromorphone; Cefuroxime; Ciprocinonide; Codeine; Floxin; Keflex; Levofloxacin; Sulfa antibiotics; and Ciprofloxacin  Social History History  Substance Use Topics  . Smoking status: Former Smoker -- 1.00 packs/day for 12 years    Types: Cigarettes  . Smokeless tobacco: Not on file  . Alcohol Use: No    Review of Systems Constitutional: Negative for fever. Eyes: Negative for visual changes. ENT: Negative for sore throat. Cardiovascular: Negative for chest pain. Positive  for palpitations Respiratory: Negative for shortness of breath. Gastrointestinal: Negative for abdominal pain, vomiting and diarrhea. Genitourinary: Negative for dysuria. Musculoskeletal: Negative for back pain. Skin: Negative for rash. Neurological: Negative for headaches, positive for dizziness  10-point ROS otherwise negative.  ____________________________________________   PHYSICAL EXAM:  VITAL SIGNS: ED Triage Vitals  Enc Vitals Group     BP 09/06/14 0954 163/76 mmHg     Pulse Rate 09/06/14 0954 80     Resp 09/06/14 0954 20     Temp 09/06/14 0954 98 F (36.7 C)     Temp Source 09/06/14 0954 Oral     SpO2 09/06/14 0954 100 %     Weight 09/06/14 0954 182 lb (82.555 kg)     Height 09/06/14 0954 5\' 8"  (1.727 m)     Head Cir --      Peak Flow --      Pain Score --      Pain Loc --      Pain Edu? --      Excl. in [place]? --     Constitutional: Alert and oriented. Well appearing and in no distress. Eyes: Conjunctivae are normal. PERRL. Normal extraocular movements. ENT   Head: Normocephalic and atraumatic.   Nose: No congestion/rhinnorhea.   Mouth/Throat: Mucous membranes are moist.   Neck: No stridor. Hematological/Lymphatic/Immunilogical: No cervical lymphadenopathy. Cardiovascular: Normal rate, regular rhythm. Normal and symmetric distal pulses are present in all extremities. No murmurs, rubs, or gallops. Respiratory: Normal respiratory effort without tachypnea nor retractions. Breath sounds are clear and equal bilaterally. No wheezes/rales/rhonchi. Gastrointestinal: Soft and nontender. No distention. No abdominal bruits. There is no CVA tenderness. Musculoskeletal:  Nontender with normal range of motion in all extremities. No joint effusions.  No lower extremity tenderness nor edema. Neurologic:  Normal speech and language. No gross focal neurologic deficits are appreciated. Speech is normal. No gait instability. Skin:  Skin is warm, dry and intact. No rash  noted. Psychiatric: Mood and affect are normal. Speech and behavior are normal. Patient exhibits appropriate insight and judgment. ____________________________________________  EKG: Interpreted by me. Normal sinus rhythm with left anterior fascicular block, mild voltage criteria for LVH. Left axis deviation. Rate is 70 bpm  ____________________________________________  ED COURSE:  Pertinent labs & imaging results that were available during my care of the patient were reviewed by me and considered in my medical decision making (see chart for details). She will need cardiac labs, chest x-ray, EKG and reevaluation. ____________________________________________    LABS (pertinent positives/negatives)  Labs Reviewed  CBC WITH DIFFERENTIAL/PLATELET - Abnormal; Notable for the following:    Neutro Abs 7.1 (*)    All other components within normal limits  COMPREHENSIVE METABOLIC PANEL - Abnormal; Notable for the following:    Glucose, Bld 140 (*)    Calcium 8.4 (*)    All other components within normal limits  TROPONIN I  TSH    RADIOLOGY Images were viewed by me  Chest x-ray IMPRESSION: Stable appearing scarring with bronchiectasis right base. No new opacity. No frank edema or consolidation. No change in cardiac silhouette.  ____________________________________________  FINAL ASSESSMENT AND PLAN  Dizziness  Plan: Patient with labs and imaging as dictated above. Patient was not orthostatic when checked by me. No clear etiology for symptoms is identified at this time. She was given a liter fluid bolus and is advised to have close follow-up with doctor for evaluation   Earleen Newport, MD   Earleen Newport, MD 09/06/14 1235

## 2014-09-06 NOTE — ED Notes (Signed)
States she developed some palpations after eating breakfast.. And felt like she was going to pass out

## 2014-12-29 DIAGNOSIS — Z96612 Presence of left artificial shoulder joint: Secondary | ICD-10-CM | POA: Insufficient documentation

## 2015-01-04 ENCOUNTER — Ambulatory Visit: Payer: Federal, State, Local not specified - PPO | Attending: Orthopedic Surgery | Admitting: Physical Therapy

## 2015-01-04 ENCOUNTER — Encounter: Payer: Self-pay | Admitting: Physical Therapy

## 2015-01-04 DIAGNOSIS — M6281 Muscle weakness (generalized): Secondary | ICD-10-CM | POA: Insufficient documentation

## 2015-01-04 DIAGNOSIS — M25612 Stiffness of left shoulder, not elsewhere classified: Secondary | ICD-10-CM | POA: Insufficient documentation

## 2015-01-04 DIAGNOSIS — M25512 Pain in left shoulder: Secondary | ICD-10-CM | POA: Diagnosis present

## 2015-01-04 NOTE — Therapy (Signed)
[facility] [address] [place], [place], [address] Phone: [phone]   Fax:  [phone]  Physical Therapy Evaluation  Patient Details  Name: Joanna Ortiz MRN: [id number] Date of Birth: Aug 26, 1945 No Data Recorded  Encounter Date: 01/04/2015      PT End of Session - 01/04/15 1248    Visit Number 1   Number of Visits 16   Date for PT Re-Evaluation 02/15/15   Authorization - Visit Number 1   Authorization - Number of Visits 10   PT Start Time 0850   PT Stop Time 0948   PT Time Calculation (min) 58 min   Activity Tolerance Patient limited by pain   Behavior During Therapy [facility] for tasks assessed/performed      Past Medical History  Diagnosis Date  . Crohn disease ([place])   . Thyroid disease     Past Surgical History  Procedure Laterality Date  . Abdominal hysterectomy  1987  . Thyroid surgery  1992  . Small intestine surgery  2012  . Colonoscopy  2013  . Colon surgery  06/06/2011    Resection of terminal ileum and previous ileocolonic anastomosis for recurrent Crohn's disease. 3 focal strictures identified.    There were no vitals filed for this visit.  Visit Diagnosis:  Left shoulder pain  Muscle weakness  Shoulder joint stiffness, left      Subjective Assessment - 01/04/15 0855    Subjective Pt reports L TSR DOS: 12/13/24. Pt states her pain today is a 3/10 at rest.  Over the past week, her pain at its worst 5/10 and at the best a 0/10. Pt reports being able to sleep in the bed with lots of pillows to prop herself up. Pt states that she has not moved her L shoulder overhead and is afraid to move her arm. Pt states she occasionally has pain with elbow flexion.   Patient is accompained by: Family member   Limitations Lifting;House hold activities   Currently in Pain? Yes   Pain Score 3    Pain Location Shoulder   Pain Orientation Left   Pain Type Surgical pain   Pain Onset 1 to 4 weeks ago   Pain  Frequency Intermittent   Pain Relieving Factors sling use/ meds      Supine R shoulder AROM: flexion (164 deg.), abd. (148 deg.), ER (61 deg.), IR (WNL).   Supine L shoulder PROM: flexion (82 deg.), abd. (53 deg.), ER (-38 deg.), IR (resting position with sling).  Cervical AROM: WFL with R UT tenderness due to sling positioning.  Grip strength (position #3): R (26.8#), L (12.5#).    Supine manual L shoulder PROM all planes per MD protocol as tolerated 10x each.  Pain limited with muscle guarding/ fear.  PT educated pt. On importance of relaxing L shoulder musculature during PROM.           PT Education - 01/05/15 1123    Education provided Yes   Education Details see HEP/ discussed shoulder protocol.    Person(s) Educated Patient   Methods Explanation;Demonstration;Handout   Comprehension Verbalized understanding;Returned demonstration;Verbal cues required             PT Long Term Goals - 01/05/15 1438    PT LONG TERM GOAL #1   Title Pt. I with HEP to increase L shoulder AROM to [facility] as compared to R shoulder in sitting position to progress pain-free mobility.     Time 6  Period Weeks   Status New   PT LONG TERM GOAL #2   Title Pt. able to lift/carry 10# object with proper body mechanics and no c/o L shoulder pain to improve mobility.    Time 6   Period Weeks   Status New   PT LONG TERM GOAL #3   Title Pt. will decrease QuickDASH to <30% to improve UE functional mobility/ sleeping.    Time 6   Period Weeks   Status New   PT LONG TERM GOAL #4   Title Pt. able to complete household cleaning/ dressing with no L shoulder pain or limitations.     Time 6   Period Weeks   Status New               Plan - 01/13/15 1249    Clinical Impression Statement Pt is a pleasant 69 y.o. F referred to PT following a L TSR DOS: 12/14/14.  Pt reports pain at a 3/10 in her L shoulder at rest. Pt is pain limited with supine L shoulder PROM: flexion (82 deg.), abd. (53 deg.), ER  (-38 deg.), IR (resting position with sling).  Supine R shoulder AROM: flexion (164 deg.), abd. (148 deg.), ER (61 deg.), IR (WNL)   Grip strength (position #3): R (26.8#), L (12.5#).  QuickDASH: 66%.  Good scar/ incision healing with no tenderness with palpation.  Pt. educated about MD protocol for TSR and use of sling.  Pt. will benefit from skilled PT services to increase L shoulder AROM/stability to improve pain-free mobility.     Pt will benefit from skilled therapeutic intervention in order to improve on the following deficits Hypomobility;Decreased strength;Impaired UE functional use;Pain;Decreased mobility;Decreased range of motion;Improper body mechanics;Decreased endurance;Decreased activity tolerance;Postural dysfunction   Rehab Potential Good   PT Frequency 2x / week   PT Duration 6 weeks   PT Treatment/Interventions ADLs/Self Care Home Management;Passive range of motion;Patient/family education;Cryotherapy;Electrical Stimulation;Iontophoresis 4mg /ml Dexamethasone;Therapeutic activities;Manual techniques;Therapeutic exercise;Moist Heat   PT Next Visit Plan progress PROM/AAROM/soft tissue mobilizations   PT Home Exercise Plan see handout   Consulted and Agree with Plan of Care Patient          G-Codes - 01/13/2015 1742    Functional Assessment Tool Used QuickDASH/ clinical judgement/ pain/ ROM   Functional Limitation Carrying, moving and handling objects   Carrying, Moving and Handling Objects Current Status [id number]) At least 60 percent but less than 80 percent impaired, limited or restricted   Carrying, Moving and Handling Objects Goal Status [id number]) At least 20 percent but less than 40 percent impaired, limited or restricted       Problem List Patient Active Problem List   Diagnosis Date Noted  . Rectal bleeding 06/10/2014  . AP (abdominal pain) 12/29/2013  . Ventral hernia without obstruction or gangrene 12/18/2013  . Crohn's disease ([place]) 12/18/2013   Pura Spice, PT,  DPT # [phone]   01/05/2015, 2:44 PM  [facility] [address] [place], [place], [address] Phone: [phone]   Fax:  [phone]  Name: Joanna Ortiz MRN: [id number] Date of Birth: 1945/06/18

## 2015-01-06 ENCOUNTER — Encounter: Payer: Self-pay | Admitting: Physical Therapy

## 2015-01-06 ENCOUNTER — Ambulatory Visit: Payer: Federal, State, Local not specified - PPO | Admitting: Physical Therapy

## 2015-01-06 DIAGNOSIS — M6281 Muscle weakness (generalized): Secondary | ICD-10-CM

## 2015-01-06 DIAGNOSIS — M25612 Stiffness of left shoulder, not elsewhere classified: Secondary | ICD-10-CM

## 2015-01-06 DIAGNOSIS — M25512 Pain in left shoulder: Secondary | ICD-10-CM

## 2015-01-06 NOTE — Therapy (Signed)
[facility] [address] [place], [place], [address] Phone: [phone]   Fax:  [phone]  Physical Therapy Treatment  Patient Details  Name: SENDY ALFONSO MRN: [id number] Date of Birth: Jul 26, 1945 No Data Recorded  Encounter Date: 01/06/2015      PT End of Session - 01/06/15 1716    Visit Number 2   Number of Visits 16   Date for PT Re-Evaluation 02/15/15   Authorization - Visit Number 2   Authorization - Number of Visits 10   PT Start Time 0900   PT Stop Time 0948   PT Time Calculation (min) 48 min   Activity Tolerance Patient limited by pain   Behavior During Therapy [facility] for tasks assessed/performed      Past Medical History  Diagnosis Date  . Crohn disease ([place])   . Thyroid disease     Past Surgical History  Procedure Laterality Date  . Abdominal hysterectomy  1987  . Thyroid surgery  1992  . Small intestine surgery  2012  . Colonoscopy  2013  . Colon surgery  06/06/2011    Resection of terminal ileum and previous ileocolonic anastomosis for recurrent Crohn's disease. 3 focal strictures identified.    There were no vitals filed for this visit.  Visit Diagnosis:  Left shoulder pain  Muscle weakness  Shoulder joint stiffness, left  Muscle weakness (generalized)      Subjective Assessment - 01/06/15 1713    Subjective Pt reports pain at rest at a 3/10 in her L shoulder. Pt reports pain with PROM or active elbow flexion at a 5/10.    Patient is accompained by: Family member   Limitations Lifting;House hold activities   Currently in Pain? Yes   Pain Score 5    Pain Location Shoulder   Pain Orientation Left   Pain Descriptors / Indicators Guarding   Pain Onset 1 to 4 weeks ago       OBJECTIVE: Manual: shoulder abduction/flexion PROM. Limited by pain and guarding. STM to L upper trap and L anterior deltoid/bicep. Palpable tenderness noted to L anterior deltoid/bicep. There ex: AROM elbow flexion  and wrist flexion/extension limited by pain. Pt educated on TENS unit set up and given TENS to help control pain. Pt had TENS on L shoulder in an X pattern with 4 electrodes (anterior shoulder, bicep, posterior upper trap, tricep) Modulating I 3 mA at 120 Hz. Attempted to continue with manual but pt unable to tolerate secondary to pain and guarding. Pulleys in sitting with ESTIM on L shoulder.   Pt response to tx for medical necessity: Pt benefits from PROM to increase pain free mobility. Pt limited in all activities today secondary to pain and fear of pain.  Very guarded/ pain limited.             PT Education - 01/06/15 1715    Education provided Yes   Education Details Pt was educated on proper use of TENS unit and electrode placement.    Person(s) Educated Patient   Methods Explanation;Handout;Demonstration   Comprehension Verbalized understanding             PT Long Term Goals - 01/05/15 1438    PT LONG TERM GOAL #1   Title Pt. I with HEP to increase L shoulder AROM to [facility] as compared to R shoulder in sitting position to progress pain-free mobility.     Time 6   Period Weeks   Status New   PT  LONG TERM GOAL #2   Title Pt. able to lift/carry 10# object with proper body mechanics and no c/o L shoulder pain to improve mobility.    Time 6   Period Weeks   Status New   PT LONG TERM GOAL #3   Title Pt. will decrease QuickDASH to <30% to improve UE functional mobility/ sleeping.    Time 6   Period Weeks   Status New   PT LONG TERM GOAL #4   Title Pt. able to complete household cleaning/ dressing with no L shoulder pain or limitations.     Time 6   Period Weeks   Status New               Plan - 01/06/15 1717    Clinical Impression Statement Pt is guarded against all movement secondary to fear of pain. Pt has pain with external rotation, flexion and abduction. Pt finds relief with ESTIM but still unable to tolerate PROM. Pt is limited in elbow flexion actively  secondary to pain. Pt is able to perform shoulder flexion with the pulleys with ESTIM on. Pt is educated on ESTIM and given a unit for the weekend.    Pt will benefit from skilled therapeutic intervention in order to improve on the following deficits Hypomobility;Decreased strength;Impaired UE functional use;Pain;Decreased mobility;Decreased range of motion;Improper body mechanics;Decreased endurance;Decreased activity tolerance;Postural dysfunction   Rehab Potential Good   PT Frequency 2x / week   PT Duration 6 weeks   PT Treatment/Interventions ADLs/Self Care Home Management;Passive range of motion;Patient/family education;Cryotherapy;Electrical Stimulation;Iontophoresis 4mg /ml Dexamethasone;Therapeutic activities;Manual techniques;Therapeutic exercise;Moist Heat   PT Next Visit Plan progress PROM/AAROM/soft tissue mobilizations/ pain free mobility   PT Home Exercise Plan ESTIM/pulleys/stretching   Consulted and Agree with Plan of Care Patient        Problem List Patient Active Problem List   Diagnosis Date Noted  . Rectal bleeding 06/10/2014  . AP (abdominal pain) 12/29/2013  . Ventral hernia without obstruction or gangrene 12/18/2013  . Crohn's disease ([place]) 12/18/2013    Lavone Neri, SPT 01/06/2015, 5:22 PM  [facility] [address] [place], [place], [address] Phone: [phone]   Fax:  [phone]  Name: AGGIE HACKMAN MRN: [id number] Date of Birth: [birth year]

## 2015-01-07 ENCOUNTER — Encounter: Payer: Self-pay | Admitting: *Deleted

## 2015-01-07 ENCOUNTER — Ambulatory Visit
Admission: EM | Admit: 2015-01-07 | Discharge: 2015-01-07 | Disposition: A | Discharge status: Home / Self Care | Payer: Federal, State, Local not specified - PPO | Attending: Family Medicine | Admitting: Family Medicine

## 2015-01-07 DIAGNOSIS — N390 Urinary tract infection, site not specified: Secondary | ICD-10-CM

## 2015-01-07 DIAGNOSIS — H9202 Otalgia, left ear: Secondary | ICD-10-CM

## 2015-01-07 DIAGNOSIS — R319 Hematuria, unspecified: Secondary | ICD-10-CM

## 2015-01-07 LAB — URINALYSIS COMPLETE WITH MICROSCOPIC (ARMC ONLY)
Bilirubin Urine: NEGATIVE
Glucose, UA: NEGATIVE mg/dL
Ketones, ur: NEGATIVE mg/dL
Nitrite: NEGATIVE
PROTEIN: NEGATIVE mg/dL
SPECIFIC GRAVITY, URINE: 1.01 (ref 1.005–1.030)
pH: 6 (ref 5.0–8.0)

## 2015-01-07 MED ORDER — NITROFURANTOIN MONOHYD MACRO 100 MG PO CAPS: 100.0000 mg | ORAL_CAPSULE | Freq: Two times a day (BID) | ORAL | Status: DC

## 2015-01-07 NOTE — ED Notes (Signed)
Pt states that she has left ear pain since that started last week.  Pt states that she has seen blood on the tissue when she wipes after urinating and is having urinary urgency that started last week as well

## 2015-01-07 NOTE — ED Provider Notes (Signed)
Patient presents today with symptoms of left ear pain for the last week. Patient has been putting drops in the ear that are over-the-counter. She also has noticed a little bit of blood when she wipes when urinating. She has had some urinary frequency and urgency for the last week. She states that she had a shoulder replacement last month and had a Foley catheter placed which she doesn't know if this has contributed to her current symptoms. She denies any nausea, vomiting, flank pain, fever, severe headache, confusion. She has not noticed any discharge from the ear. She does not have a sore throat or cough.  ROS: Negative except mentioned above.  Vitals as per Epic. GENERAL: NAD HEENT: no pharyngeal erythema, no exudate, no erythema of right TM, no erythema of left TM, has white cotton-like substance in the canal RESP: CTA B CARD: RRR ABD: +BS, NT, no flank tenderness  A/P: 1)UTI-will treat with Macrobid, send urine for culture, seek medical attention if symptoms do persist or worsen as discussed.  2)L Otalgia- recommend follow-up with ENT on Monday, avoid any water in the ear at this time.  Paulina Fusi, MD 01/07/15 2001

## 2015-01-09 LAB — URINE CULTURE

## 2015-01-10 ENCOUNTER — Encounter: Payer: Medicare Other | Admitting: Physical Therapy

## 2015-01-12 ENCOUNTER — Ambulatory Visit: Payer: Federal, State, Local not specified - PPO | Admitting: Physical Therapy

## 2015-01-12 DIAGNOSIS — M25612 Stiffness of left shoulder, not elsewhere classified: Secondary | ICD-10-CM

## 2015-01-12 DIAGNOSIS — M25512 Pain in left shoulder: Secondary | ICD-10-CM

## 2015-01-12 DIAGNOSIS — M6281 Muscle weakness (generalized): Secondary | ICD-10-CM

## 2015-01-12 NOTE — Therapy (Signed)
[facility] [address] [place], [place], [address] Phone: [phone]   Fax:  [phone]  Physical Therapy Treatment  Patient Details  Name: Joanna Ortiz MRN: [id number] Date of Birth: 12/29/1945 No Data Recorded  Encounter Date: 01/12/2015      PT End of Session - 01/12/15 1712    Visit Number 3   Number of Visits 16   Date for PT Re-Evaluation 02/15/15   Authorization - Visit Number 3   Authorization - Number of Visits 10   PT Start Time [id number]   PT Stop Time 0943   PT Time Calculation (min) 45 min   Activity Tolerance Patient limited by pain   Behavior During Therapy [facility] for tasks assessed/performed      Past Medical History  Diagnosis Date  . Crohn disease ([place])   . Thyroid disease     Past Surgical History  Procedure Laterality Date  . Abdominal hysterectomy  1987  . Thyroid surgery  1992  . Small intestine surgery  2012  . Colonoscopy  2013  . Colon surgery  06/06/2011    Resection of terminal ileum and previous ileocolonic anastomosis for recurrent Crohn's disease. 3 focal strictures identified.    There were no vitals filed for this visit.  Visit Diagnosis:  Left shoulder pain  Muscle weakness  Shoulder joint stiffness, left  Muscle weakness (generalized)  Stiffness of left shoulder joint      Subjective Assessment - 01/12/15 1710    Subjective Pt. states she cancelled Monday appt. due to L ear infection/ popped ear drum and UTI.  Pt. being treated by antibiotics and unable to lie supine due to ear drum.  Pt. states she has benefitted from use of TENS for pain mgmt. during HEP.    Patient is accompained by: Family member   Limitations Lifting;House hold activities   Patient Stated Goals Increase L shoulder ROM/ strength to improve pain-free mobility.    Currently in Pain? Yes   Pain Score 5    Pain Location Shoulder   Pain Orientation Left      OBJECTIVE: Manual: Seated shoulder  abduction/flexion/ER PROM (14 min.)- decrease muscle guarding. STM to L upper trap and L anterior deltoid/bicep and instructed in proper scar massage to incision (no steristrips in place). There ex:  Seated shoulder pulley flexion (facing door)- 15x.  AROM elbow flexion and wrist flexion/extension/ pron./ sup.  Pts. daughter educated on TENS unit set up and given TENS to help control pain. Pt had TENS on L shoulder in an X pattern with 4 electrodes (anterior shoulder, bicep, posterior upper trap, tricep) Modulating I 3+ mA at 120 Hz.  Pt response to tx for medical necessity: Pt benefits from PROM to increase pain free mobility. Benefits from use of TENS for pain control with manual tx./ PROM.  Good incision healing.          PT Long Term Goals - 01/05/15 1438    PT LONG TERM GOAL #1   Title Pt. I with HEP to increase L shoulder AROM to [facility] as compared to R shoulder in sitting position to progress pain-free mobility.     Time 6   Period Weeks   Status New   PT LONG TERM GOAL #2   Title Pt. able to lift/carry 10# object with proper body mechanics and no c/o L shoulder pain to improve mobility.    Time 6   Period Weeks   Status New  PT LONG TERM GOAL #3   Title Pt. will decrease QuickDASH to <30% to improve UE functional mobility/ sleeping.    Time 6   Period Weeks   Status New   PT LONG TERM GOAL #4   Title Pt. able to complete household cleaning/ dressing with no L shoulder pain or limitations.     Time 6   Period Weeks   Status New            Plan - 01/12/15 1714    Clinical Impression Statement Marked increase in L shoulder AA/PROM today with decrease muscle guarding in seate position.  L shoulder PROM: flexion 95 deg., abd. 92 deg.  Pt. limited with L shoulder ER/ neutral arm position during biceps curl AROM.  Pt./ dtr. instructed in proper electrode placement for TENS for pain mgmt. with HEP.  Pt. weaning off of sling while at home but uses sling out in community.      Pt will benefit from skilled therapeutic intervention in order to improve on the following deficits Hypomobility;Decreased strength;Impaired UE functional use;Pain;Decreased mobility;Decreased range of motion;Improper body mechanics;Decreased endurance;Decreased activity tolerance;Postural dysfunction   Rehab Potential Good   PT Frequency 2x / week   PT Duration 6 weeks   PT Treatment/Interventions ADLs/Self Care Home Management;Passive range of motion;Patient/family education;Cryotherapy;Electrical Stimulation;Iontophoresis 4mg /ml Dexamethasone;Therapeutic activities;Manual techniques;Therapeutic exercise;Moist Heat   PT Next Visit Plan progress PROM/AAROM/soft tissue mobilizations/ pain free mobility   PT Home Exercise Plan ESTIM/pulleys/stretching   Consulted and Agree with Plan of Care Patient        Problem List Patient Active Problem List   Diagnosis Date Noted  . Rectal bleeding 06/10/2014  . AP (abdominal pain) 12/29/2013  . Ventral hernia without obstruction or gangrene 12/18/2013  . Crohn's disease ([place]) 12/18/2013   Pura Spice, PT, DPT # [phone]   01/12/2015, 5:17 PM  [place] [facility] [address] [place], [place], [address] Phone: [phone]   Fax:  [phone]  Name: Joanna Ortiz MRN: [id number] Date of Birth: 05-02-1945

## 2015-01-17 ENCOUNTER — Ambulatory Visit: Payer: Federal, State, Local not specified - PPO | Admitting: Physical Therapy

## 2015-01-17 ENCOUNTER — Encounter: Payer: Self-pay | Admitting: Physical Therapy

## 2015-01-17 DIAGNOSIS — M6281 Muscle weakness (generalized): Secondary | ICD-10-CM

## 2015-01-17 DIAGNOSIS — M25612 Stiffness of left shoulder, not elsewhere classified: Secondary | ICD-10-CM

## 2015-01-17 DIAGNOSIS — M25512 Pain in left shoulder: Secondary | ICD-10-CM | POA: Diagnosis not present

## 2015-01-17 NOTE — Therapy (Signed)
[place] [facility] [address] [place], [place], [address] Phone: [phone]   Fax:  [phone]  Physical Therapy Treatment  Patient Details  Name: Joanna Ortiz MRN: [id number] Date of Birth: 1945/09/24 No Data Recorded  Encounter Date: 01/17/2015      PT End of Session - 01/17/15 0923    Visit Number 4   Number of Visits 16   Date for PT Re-Evaluation 02/15/15   Authorization - Visit Number 4   Authorization - Number of Visits 10   PT Start Time 0903   PT Stop Time 0945   PT Time Calculation (min) 42 min   Activity Tolerance Patient limited by pain   Behavior During Therapy [facility] for tasks assessed/performed      Past Medical History  Diagnosis Date  . Crohn disease ([place])   . Thyroid disease     Past Surgical History  Procedure Laterality Date  . Abdominal hysterectomy  1987  . Thyroid surgery  1992  . Small intestine surgery  2012  . Colonoscopy  2013  . Colon surgery  06/06/2011    Resection of terminal ileum and previous ileocolonic anastomosis for recurrent Crohn's disease. 3 focal strictures identified.    There were no vitals filed for this visit.  Visit Diagnosis:  Muscle weakness  Left shoulder pain  Shoulder joint stiffness, left      Subjective Assessment - 01/17/15 0920    Subjective Pt reports no pain upon arrival to PT tx session. Pt has pain with AAROM and PROM. Pt reports low back pain and pain in her legs. Pt reports having a similar reaction to antibiotics years ago. Pt reports a grabbing pain in her L shoulder that will not let go.    Patient is accompained by: Family member   Limitations Lifting;House hold activities   Patient Stated Goals Increase L shoulder ROM/ strength to improve pain-free mobility.    Currently in Pain? Yes   Pain Score 4    Pain Location Shoulder   Pain Orientation Left   Pain Descriptors / Indicators Dull   Pain Type Surgical pain   Pain Onset 1 to 4 weeks ago    Pain Frequency Intermittent       OBJECTIVE: Manual: PROM to L shoulder in pain free range. Pt initially unable to tolerate PROM but able to tolerate flexion in sidelying and then in supine. Pt is able to reach hand to forehead once relaxed with PROM. STM to L bicep and anterior deltoid. Palpable tenderness and trigger points noted in L bicep. Pt is able to perform 8 repetitions of elbow flexion before complaints of increased pain.   Pt response to tx for medical necessity: Pt benefits from PROM to regain L shoulder mobility. Pt continues to benefits from [facility] to return L side to equal of R side.         PT Long Term Goals - 01/05/15 1438    PT LONG TERM GOAL #1   Title Pt. I with HEP to increase L shoulder AROM to [facility] as compared to R shoulder in sitting position to progress pain-free mobility.     Time 6   Period Weeks   Status New   PT LONG TERM GOAL #2   Title Pt. able to lift/carry 10# object with proper body mechanics and no c/o L shoulder pain to improve mobility.    Time 6   Period Weeks   Status New   PT LONG  TERM GOAL #3   Title Pt. will decrease QuickDASH to <30% to improve UE functional mobility/ sleeping.    Time 6   Period Weeks   Status New   PT LONG TERM GOAL #4   Title Pt. able to complete household cleaning/ dressing with no L shoulder pain or limitations.     Time 6   Period Weeks   Status New            Plan - 01/17/15 [id number]    Clinical Impression Statement Pt is limited in L shoulder AAROM and PROM secondary to pain and grabbing sensation. Pt is unable to tolerate PROM in supine or sidelying. Pt has difficulty relaxing with movement which adds to pain. Pt is unable to perform AAROM with wand in supine in order to increase sholder mobility. Pt has palable tenderness in L bicep and anterior deltoid; trigger point noted in L bicep. Pt educated on importance of asking MD to determine if grabiing pains are related to Cipro.    Pt will benefit from skilled  therapeutic intervention in order to improve on the following deficits Hypomobility;Decreased strength;Impaired UE functional use;Pain;Decreased mobility;Decreased range of motion;Improper body mechanics;Decreased endurance;Decreased activity tolerance;Postural dysfunction   Rehab Potential Good   PT Frequency 2x / week   PT Duration 6 weeks   PT Treatment/Interventions ADLs/Self Care Home Management;Passive range of motion;Patient/family education;Cryotherapy;Electrical Stimulation;Iontophoresis 4mg /ml Dexamethasone;Therapeutic activities;Manual techniques;Therapeutic exercise;Moist Heat   PT Next Visit Plan progress motion in pain-free range/wand AAROM/Ball AAROM in standing/soft tissue   PT Home Exercise Plan continue current plan after clear for MDs if related to antibiotic   Consulted and Agree with Plan of Care Patient        Problem List Patient Active Problem List   Diagnosis Date Noted  . Rectal bleeding 06/10/2014  . AP (abdominal pain) 12/29/2013  . Ventral hernia without obstruction or gangrene 12/18/2013  . Crohn's disease ([place]) 12/18/2013    Pura Spice, PT, DPT # [phone]   01/18/2015, 2:27 PM  [place] [facility] [address] [place], [place], [address] Phone: [phone]   Fax:  [phone]  Name: Joanna Ortiz MRN: [id number] Date of Birth: 07/27/1945

## 2015-01-19 ENCOUNTER — Encounter: Payer: Self-pay | Admitting: Physical Therapy

## 2015-01-19 ENCOUNTER — Ambulatory Visit: Payer: Federal, State, Local not specified - PPO | Admitting: Physical Therapy

## 2015-01-19 DIAGNOSIS — M25512 Pain in left shoulder: Secondary | ICD-10-CM | POA: Diagnosis not present

## 2015-01-19 DIAGNOSIS — M6281 Muscle weakness (generalized): Secondary | ICD-10-CM

## 2015-01-19 DIAGNOSIS — M25612 Stiffness of left shoulder, not elsewhere classified: Secondary | ICD-10-CM

## 2015-01-19 NOTE — Therapy (Signed)
[place] [facility] [address]. [place], [place], [address] Phone: [phone]   Fax:  [phone]  Physical Therapy Treatment  Patient Details  Name: Joanna Ortiz MRN: [id number] Date of Birth: June 11, 1945 No Data Recorded  Encounter Date: 01/19/2015      PT End of Session - 01/19/15 1007    Visit Number 5   Number of Visits 16   Date for PT Re-Evaluation 02/15/15   Authorization - Visit Number 5   Authorization - Number of Visits 10   PT Start Time [id number]   PT Stop Time 0942   PT Time Calculation (min) 45 min   Activity Tolerance Patient limited by pain   Behavior During Therapy [facility] for tasks assessed/performed      Past Medical History  Diagnosis Date  . Crohn disease ([place])   . Thyroid disease     Past Surgical History  Procedure Laterality Date  . Abdominal hysterectomy  1987  . Thyroid surgery  1992  . Small intestine surgery  2012  . Colonoscopy  2013  . Colon surgery  06/06/2011    Resection of terminal ileum and previous ileocolonic anastomosis for recurrent Crohn's disease. 3 focal strictures identified.    There were no vitals filed for this visit.  Visit Diagnosis:  Muscle weakness  Left shoulder pain  Shoulder joint stiffness, left  Muscle weakness (generalized)  Stiffness of left shoulder joint      Subjective Assessment - 01/19/15 1005    Subjective Pt reports grabbing in her low back due to Cipro. Pt states she is done taking the Cipro but is having increased back pain.   Patient is accompained by: Family member   Limitations Lifting;House hold activities   Patient Stated Goals Increase L shoulder ROM/ strength to improve pain-free mobility.    Currently in Pain? Yes   Pain Score 5    Pain Location Back   Pain Orientation Lower   Pain Descriptors / Indicators Guarding   Pain Onset 1 to 4 weeks ago      OBJECTIVE: Manual: STM to L shoulder musculature/ UT region in sitting posture with  MH to back in green chair.  Decreased tenderness to palpation noted today as compared to last session.  L shoulder PROM stretching in all planes as tolerated by pain and per MD protocol in sitting. There ex: Standing table top AAROM with a towel in flexion/abduction/scaption x 15 each direction. Pt demonstrates good form with minimal cuing for decreased L upper trap activation.  Reviewed HEP/ importance of ice.  Pt response to tx for medical necessity: Pt benefits from strengthening in order to regain ROM per MD protocol.         PT Education - 01/18/15 1321    Education provided Yes   Education Details Pt instructed to discuss her symptoms with MD as she appears to be having a systemic reaction to her recent antibiotic prescription.    Person(s) Educated Patient   Methods Demonstration;Explanation   Comprehension Verbalized understanding;Returned demonstration             PT Long Term Goals - 01/05/15 1438    PT LONG TERM GOAL #1   Title Pt. I with HEP to increase L shoulder AROM to [facility] as compared to R shoulder in sitting position to progress pain-free mobility.     Time 6   Period Weeks   Status New   PT LONG TERM GOAL #2   Title Pt.  able to lift/carry 10# object with proper body mechanics and no c/o L shoulder pain to improve mobility.    Time 6   Period Weeks   Status New   PT LONG TERM GOAL #3   Title Pt. will decrease QuickDASH to <30% to improve UE functional mobility/ sleeping.    Time 6   Period Weeks   Status New   PT LONG TERM GOAL #4   Title Pt. able to complete household cleaning/ dressing with no L shoulder pain or limitations.     Time 6   Period Weeks   Status New            Plan - 01/19/15 1007    Clinical Impression Statement Pt is limited in standing tolerance by low back pain/grabbing sensation. Pt is able to relax to get her shoulder passively to 90 degrees. Pt demonstrates proper motion in flexion/abduction/scaption without upper trap  compensation with table top ex. . Pt educated to continue pulleys and HEP over the weekend. Pt to be reassessed next visit.   Pt will benefit from skilled therapeutic intervention in order to improve on the following deficits Hypomobility;Decreased strength;Impaired UE functional use;Pain;Decreased mobility;Decreased range of motion;Improper body mechanics;Decreased endurance;Decreased activity tolerance;Postural dysfunction   Rehab Potential Good   PT Frequency 2x / week   PT Duration 6 weeks   PT Treatment/Interventions ADLs/Self Care Home Management;Passive range of motion;Patient/family education;Cryotherapy;Electrical Stimulation;Iontophoresis 4mg /ml Dexamethasone;Therapeutic activities;Manual techniques;Therapeutic exercise;Moist Heat   PT Next Visit Plan progress motion in pain-free range/wand AAROM/Ball AAROM in standing/soft tissue/ROM assessment    PT Home Exercise Plan continue current plan after clear for MDs if related to antibiotic   Consulted and Agree with Plan of Care Patient        Problem List Patient Active Problem List   Diagnosis Date Noted  . Rectal bleeding 06/10/2014  . AP (abdominal pain) 12/29/2013  . Ventral hernia without obstruction or gangrene 12/18/2013  . Crohn's disease ([place]) 12/18/2013   Pura Spice, PT, DPT # [phone]   01/19/2015, 1:14 PM  [place] [facility] [address] [place], [place], [address] Phone: [phone]   Fax:  [phone]  Name: Joanna Ortiz MRN: [id number] Date of Birth: [birth year]

## 2015-01-21 ENCOUNTER — Other Ambulatory Visit
Admission: RE | Admit: 2015-01-21 | Discharge: 2015-01-21 | Disposition: A | Discharge status: Home / Self Care | Payer: Federal, State, Local not specified - PPO | Source: Ambulatory Visit | Attending: Otolaryngology | Admitting: Otolaryngology

## 2015-01-21 DIAGNOSIS — E890 Postprocedural hypothyroidism: Secondary | ICD-10-CM | POA: Diagnosis present

## 2015-01-21 LAB — T4, FREE: Free T4: 0.98 ng/dL (ref 0.61–1.12)

## 2015-01-21 LAB — TSH: TSH: 0.955 u[IU]/mL (ref 0.350–4.500)

## 2015-01-22 LAB — T3, FREE: T3, Free: 3.2 pg/mL (ref 2.0–4.4)

## 2015-01-24 ENCOUNTER — Ambulatory Visit: Payer: Federal, State, Local not specified - PPO | Attending: Orthopedic Surgery | Admitting: Physical Therapy

## 2015-01-24 DIAGNOSIS — M25512 Pain in left shoulder: Secondary | ICD-10-CM | POA: Insufficient documentation

## 2015-01-24 DIAGNOSIS — M25612 Stiffness of left shoulder, not elsewhere classified: Secondary | ICD-10-CM | POA: Insufficient documentation

## 2015-01-24 DIAGNOSIS — M6281 Muscle weakness (generalized): Secondary | ICD-10-CM | POA: Insufficient documentation

## 2015-01-26 ENCOUNTER — Ambulatory Visit: Payer: Federal, State, Local not specified - PPO | Admitting: Physical Therapy

## 2015-01-26 DIAGNOSIS — M25612 Stiffness of left shoulder, not elsewhere classified: Secondary | ICD-10-CM | POA: Diagnosis present

## 2015-01-26 DIAGNOSIS — M25512 Pain in left shoulder: Secondary | ICD-10-CM

## 2015-01-26 DIAGNOSIS — M6281 Muscle weakness (generalized): Secondary | ICD-10-CM

## 2015-01-26 NOTE — Therapy (Addendum)
[place] [facility] [address]. [place], [place], [address] Phone: [phone]   Fax:  [phone]  Physical Therapy Treatment  Patient Details  Name: Joanna Ortiz MRN: [id number] Date of Birth: 02-18-1946 No Data Recorded  Encounter Date: 01/26/2015    Past Medical History  Diagnosis Date  . Crohn disease ([place])   . Thyroid disease     Past Surgical History  Procedure Laterality Date  . Abdominal hysterectomy  1987  . Thyroid surgery  1992  . Small intestine surgery  2012  . Colonoscopy  2013  . Colon surgery  06/06/2011    Resection of terminal ileum and previous ileocolonic anastomosis for recurrent Crohn's disease. 3 focal strictures identified.    There were no vitals filed for this visit.  Visit Diagnosis:  Muscle weakness  Stiffness of left shoulder joint  Shoulder joint stiffness, left  Left shoulder pain    UBE (difficult).  Supine wand with assist. Standing and seated wand   OBJECTIVE: There ex:  Seated B UBE 3 min. F/b. (warm-up/no charge)- difficulty with backwards movement.   Supine wand B shoulder AAROM all planes (as tolerated) with assist.  Seated shoulder pulley (back towards door): flexion and abduction 15x each.   Standing table top AAROM with a towel in flexion/abduction/scaption x 15 each direction.  Reviewed HEP/ importance of ice. Manual: STM to L shoulder musculature/ UT region in sitting posture. L shoulder PROM stretching in all planes as tolerated by pain and per MD protocol in sitting and supine position.   Pt response to tx for medical necessity: Pt benefits from strengthening in order to regain ROM per MD protocol.          PT Long Term Goals - 01/05/15 1438    PT LONG TERM GOAL #1   Title Pt. I with HEP to increase L shoulder AROM to [facility] as compared to R shoulder in sitting position to progress pain-free mobility.     Time 6   Period Weeks   Status New   PT LONG TERM  GOAL #2   Title Pt. able to lift/carry 10# object with proper body mechanics and no c/o L shoulder pain to improve mobility.    Time 6   Period Weeks   Status New   PT LONG TERM GOAL #3   Title Pt. will decrease QuickDASH to <30% to improve UE functional mobility/ sleeping.    Time 6   Period Weeks   Status New   PT LONG TERM GOAL #4   Title Pt. able to complete household cleaning/ dressing with no L shoulder pain or limitations.     Time 6   Period Weeks   Status New            Plan - 01/31/15 0910    Clinical Impression Statement Pt. able to tolerate supine positioning during L shoulder manual tx./ ther.ex.  L shoulder AROM remains limited due to shoulder stiffness/ weakness/ pain in standing posture.  L shoulder PROM remains limited to 90 deg. with sigificant joint stiffness/ discomfort reported.    Pt will benefit from skilled therapeutic intervention in order to improve on the following deficits Hypomobility;Decreased strength;Impaired UE functional use;Pain;Decreased mobility;Decreased range of motion;Improper body mechanics;Decreased endurance;Decreased activity tolerance;Postural dysfunction   Rehab Potential Good   PT Frequency 2x / week   PT Duration 6 weeks   PT Treatment/Interventions ADLs/Self Care Home Management;Passive range of motion;Patient/family education;Cryotherapy;Electrical Stimulation;Iontophoresis 4mg /ml Dexamethasone;Therapeutic activities;Manual techniques;Therapeutic exercise;Moist  Heat   PT Next Visit Plan progress motion in pain-free range/wand AAROM/Ball AAROM in standing/soft tissue   Consulted and Agree with Plan of Care Patient        Problem List Patient Active Problem List   Diagnosis Date Noted  . Rectal bleeding 06/10/2014  . AP (abdominal pain) 12/29/2013  . Ventral hernia without obstruction or gangrene 12/18/2013  . Crohn's disease ([place]) 12/18/2013   Pura Spice, PT, DPT # [phone]   01/31/2015, 9:16 AM  [place] [facility] [address] [place], [place], [address] Phone: [phone]   Fax:  [phone]  Name: Joanna Ortiz MRN: [id number] Date of Birth: 03-02-1945

## 2015-01-31 ENCOUNTER — Ambulatory Visit: Payer: Federal, State, Local not specified - PPO | Admitting: Physical Therapy

## 2015-01-31 ENCOUNTER — Encounter: Payer: Self-pay | Admitting: Physical Therapy

## 2015-01-31 DIAGNOSIS — M25612 Stiffness of left shoulder, not elsewhere classified: Secondary | ICD-10-CM

## 2015-01-31 DIAGNOSIS — M6281 Muscle weakness (generalized): Secondary | ICD-10-CM

## 2015-01-31 DIAGNOSIS — M25512 Pain in left shoulder: Secondary | ICD-10-CM

## 2015-01-31 NOTE — Therapy (Signed)
[facility] [address] [place], [place], [address] Phone: [phone]   Fax:  [phone]  Physical Therapy Treatment  Patient Details  Name: Joanna Ortiz MRN: [id number] Date of Birth: [birth year] No Data Recorded  Encounter Date: 01/31/2015      PT End of Session - 01/31/15 1058    Visit Number 7   Number of Visits 16   Date for PT Re-Evaluation 02/15/15   Authorization - Visit Number 7   Authorization - Number of Visits 10   PT Start Time [id number]   PT Stop Time 0955   PT Time Calculation (min) 61 min   Activity Tolerance Patient limited by pain   Behavior During Therapy [facility] for tasks assessed/performed      Past Medical History  Diagnosis Date  . Crohn disease ([place])   . Thyroid disease     Past Surgical History  Procedure Laterality Date  . Abdominal hysterectomy  1987  . Thyroid surgery  1992  . Small intestine surgery  2012  . Colonoscopy  2013  . Colon surgery  06/06/2011    Resection of terminal ileum and previous ileocolonic anastomosis for recurrent Crohn's disease. 3 focal strictures identified.    There were no vitals filed for this visit.  Visit Diagnosis:  Muscle weakness  Stiffness of left shoulder joint  Shoulder joint stiffness, left  Left shoulder pain  Muscle weakness (generalized)      Subjective Assessment - 01/31/15 1051    Subjective Pt reports no pain at rest. Pt states wtih motion her L shoulder pain is a 4/10.    Patient is accompained by: Family member   Limitations Lifting;House hold activities   Patient Stated Goals Increase L shoulder ROM/ strength to improve pain-free mobility.    Currently in Pain? Yes   Pain Score 4    Pain Location Shoulder   Pain Orientation Left   Pain Descriptors / Indicators Guarding         OBJECTIVE: There ex: UBE F/B warm up no charge. Attempted standing AAROM with wand; pt demonstrates increase in UT compensation. Attempted seated  AAROM with wand, pt unable to perform due to grabbing sensation in L shoulder. Supine rhythmic stabilizations 15 seconds x 5. Poor inferior and medial control noted. Chest press with wand x 20. Shoulder flexion with wand x 5; pt was pain limited and guarding against movement. Standing shoulder isometrics in flexion and abduction (issued as HEP). Manual: STM to L upper trap; palpable tenderness and tightness noted. PROM in supine flexion/abduction/ER as pain tolerated and per MD protocol x 10 each direction. Long axis distraction 30 seconds x 4. Isometric ER in supine at neutral; increased pain noted.   Pt response to tx for medical necessity: Pt benefits from strengthening and PROM in order to return to prior level of function. Pt benefits from manual to decrease UT compensation and increase L shoulder stability.         PT Education - 01/31/15 1056    Education provided Yes   Education Details Pt educated on and given L shoulder flexion and abduction isometrics.    Person(s) Educated Patient   Methods Explanation;Demonstration;Handout;Verbal cues   Comprehension Verbalized understanding;Returned demonstration;Verbal cues required;Need further instruction            PT Long Term Goals - 01/05/15 1438    PT LONG TERM GOAL #1   Title Pt. I with HEP to increase L shoulder AROM to [facility] as  compared to R shoulder in sitting position to progress pain-free mobility.     Time 6   Period Weeks   Status New   PT LONG TERM GOAL #2   Title Pt. able to lift/carry 10# object with proper body mechanics and no c/o L shoulder pain to improve mobility.    Time 6   Period Weeks   Status New   PT LONG TERM GOAL #3   Title Pt. will decrease QuickDASH to <30% to improve UE functional mobility/ sleeping.    Time 6   Period Weeks   Status New   PT LONG TERM GOAL #4   Title Pt. able to complete household cleaning/ dressing with no L shoulder pain or limitations.     Time 6   Period Weeks   Status New             Plan - 01/31/15 1104    Clinical Impression Statement Pt is limited with AAROM secondary to guarding and fear of movement. With standing AAROM, pt compensates with L upper trap for all motions. Pt is able to tolerate supine there ex but limited by guarding. Pt presents with palapble tenderness in L biceps and upper trap upon palpation.   Pt will benefit from skilled therapeutic intervention in order to improve on the following deficits Hypomobility;Decreased strength;Impaired UE functional use;Pain;Decreased mobility;Decreased range of motion;Improper body mechanics;Decreased endurance;Decreased activity tolerance;Postural dysfunction   Rehab Potential Good   PT Frequency 2x / week   PT Duration 6 weeks   PT Treatment/Interventions ADLs/Self Care Home Management;Passive range of motion;Patient/family education;Cryotherapy;Electrical Stimulation;Iontophoresis 4mg /ml Dexamethasone;Therapeutic activities;Manual techniques;Therapeutic exercise;Moist Heat   PT Next Visit Plan progress motion in pain-free range/wand AAROM/Ball AAROM in standing/soft tissue.  REASSESS GOALS NEXT TX   Consulted and Agree with Plan of Care Patient        Problem List Patient Active Problem List   Diagnosis Date Noted  . Rectal bleeding 06/10/2014  . AP (abdominal pain) 12/29/2013  . Ventral hernia without obstruction or gangrene 12/18/2013  . Crohn's disease ([place]) 12/18/2013   Pura Spice, PT, DPT # [phone]   02/01/2015, 9:11 AM  [place] [facility] [address] [place], [place], [address] Phone: [phone]   Fax:  [phone]  Name: ZAAKIRAH DALOMBA MRN: [id number] Date of Birth: 22-Nov-1945

## 2015-02-02 ENCOUNTER — Encounter: Payer: Medicare Other | Admitting: Physical Therapy

## 2015-02-07 ENCOUNTER — Ambulatory Visit: Payer: Federal, State, Local not specified - PPO | Admitting: Physical Therapy

## 2015-02-07 ENCOUNTER — Encounter: Payer: Self-pay | Admitting: Physical Therapy

## 2015-02-07 DIAGNOSIS — M6281 Muscle weakness (generalized): Secondary | ICD-10-CM | POA: Diagnosis not present

## 2015-02-07 DIAGNOSIS — M25512 Pain in left shoulder: Secondary | ICD-10-CM

## 2015-02-07 DIAGNOSIS — M25612 Stiffness of left shoulder, not elsewhere classified: Secondary | ICD-10-CM

## 2015-02-07 NOTE — Therapy (Signed)
[place] [facility] [address] [place], [place], [address] Phone: [phone]   Fax:  [phone]  Physical Therapy Treatment  Patient Details  Name: Joanna Ortiz MRN: [id number] Date of Birth: 1945/05/28 No Data Recorded  Encounter Date: 02/07/2015      PT End of Session - 02/07/15 1101    Visit Number 8   Number of Visits 16   Date for PT Re-Evaluation 02/15/15   Authorization - Visit Number 8   Authorization - Number of Visits 10   PT Start Time 0849   PT Stop Time 0940   PT Time Calculation (min) 51 min   Activity Tolerance Patient limited by pain   Behavior During Therapy [facility] for tasks assessed/performed      Past Medical History  Diagnosis Date  . Crohn disease ([place])   . Thyroid disease     Past Surgical History  Procedure Laterality Date  . Abdominal hysterectomy  1987  . Thyroid surgery  1992  . Small intestine surgery  2012  . Colonoscopy  2013  . Colon surgery  06/06/2011    Resection of terminal ileum and previous ileocolonic anastomosis for recurrent Crohn's disease. 3 focal strictures identified.    There were no vitals filed for this visit.  Visit Diagnosis:  Muscle weakness  Stiffness of left shoulder joint  Shoulder joint stiffness, left  Left shoulder pain  Muscle weakness (generalized)      Subjective Assessment - 02/07/15 1058    Subjective Pt reports cold symptoms are getting better. Pt states that her L shoulder continues to grab with exercise.    Patient is accompained by: Family member   Limitations Lifting;House hold activities   Patient Stated Goals Increase L shoulder ROM/ strength to improve pain-free mobility.    Currently in Pain? Yes   Pain Score 3    Pain Location Shoulder   Pain Orientation Left      OBJECTIVE: There ex: UBE F/B warm up no charge. Standing AAROM shoulder flexion and abduction. Flexion x 20 abduction limited by guarding. Seated abduction and ER  shoulder AAROM x 20 with increased verbal cues. Seated shoulder flexion AROM x 20. Seated bicep curls x 20 with 4# weights. Supine AAROM with 20% guidance from PT L shoulder flexion and abduction. Shoulder extension to chest height with 10# on Nautilus x 20. Manual: In sitting, STM to L upper trap. Increased soreness and tenderness noted.  Pt response to tx for medical necessity: Pt benefits from strengthening to properly train shoulder combination movements and return to prior level of function.         PT Long Term Goals - 01/05/15 1438    PT LONG TERM GOAL #1   Title Pt. I with HEP to increase L shoulder AROM to [facility] as compared to R shoulder in sitting position to progress pain-free mobility.     Time 6   Period Weeks   Status New   PT LONG TERM GOAL #2   Title Pt. able to lift/carry 10# object with proper body mechanics and no c/o L shoulder pain to improve mobility.    Time 6   Period Weeks   Status New   PT LONG TERM GOAL #3   Title Pt. will decrease QuickDASH to <30% to improve UE functional mobility/ sleeping.    Time 6   Period Weeks   Status New   PT LONG TERM GOAL #4   Title Pt. able to complete  household cleaning/ dressing with no L shoulder pain or limitations.     Time 6   Period Weeks   Status New               Plan - 02/07/15 1101    Clinical Impression Statement Pt is limited with AAROM secondary to guarding and weakness. Pt is able to relax and perform AAROM with 20% from the PT. Pt is able to demonstrate proper use of elbow flexion to help alleviate pain but performs often to keep L shoulder from grabbing. Pt has difficulty performing strengthening without upper trap compensation.   Pt will benefit from skilled therapeutic intervention in order to improve on the following deficits Hypomobility;Decreased strength;Impaired UE functional use;Pain;Decreased mobility;Decreased range of motion;Improper body mechanics;Decreased endurance;Decreased activity  tolerance;Postural dysfunction   Rehab Potential Good   PT Frequency 2x / week   PT Duration 6 weeks   PT Treatment/Interventions ADLs/Self Care Home Management;Passive range of motion;Patient/family education;Cryotherapy;Electrical Stimulation;Iontophoresis 4mg /ml Dexamethasone;Therapeutic activities;Manual techniques;Therapeutic exercise;Moist Heat   PT Next Visit Plan progress motion in pain-free range/wand AAROM/Ball AAROM in standing/soft tissue.  REASSESS GOALS NEXT TX   Consulted and Agree with Plan of Care Patient        Problem List Patient Active Problem List   Diagnosis Date Noted  . Rectal bleeding 06/10/2014  . AP (abdominal pain) 12/29/2013  . Ventral hernia without obstruction or gangrene 12/18/2013  . Crohn's disease ([place]) 12/18/2013    Lavone Neri, SPT 02/07/2015, 11:03 AM  [place] [facility] [address] [place], [place], [address] Phone: [phone]   Fax:  [phone]  Name: Joanna Ortiz MRN: [id number] Date of Birth: [birth year]

## 2015-02-10 ENCOUNTER — Encounter: Payer: Self-pay | Admitting: Physical Therapy

## 2015-02-10 ENCOUNTER — Ambulatory Visit: Payer: Federal, State, Local not specified - PPO | Admitting: Physical Therapy

## 2015-02-10 DIAGNOSIS — M6281 Muscle weakness (generalized): Secondary | ICD-10-CM

## 2015-02-10 DIAGNOSIS — M25612 Stiffness of left shoulder, not elsewhere classified: Secondary | ICD-10-CM

## 2015-02-10 DIAGNOSIS — M25512 Pain in left shoulder: Secondary | ICD-10-CM

## 2015-02-10 NOTE — Therapy (Signed)
[facility] [address] [place], [place], [address] Phone: [phone]   Fax:  [phone]  Physical Therapy Treatment  Patient Details  Name: Joanna Ortiz MRN: [id number] Date of Birth: 1945/02/29 No Data Recorded  Encounter Date: 02/10/2015      PT End of Session - 02/10/15 1121    Visit Number 9   Number of Visits 16   Date for PT Re-Evaluation 02/15/15   Authorization - Visit Number 9   Authorization - Number of Visits 10   PT Start Time [id number]   PT Stop Time 0939   PT Time Calculation (min) 44 min   Activity Tolerance Patient limited by pain;Patient tolerated treatment well   Behavior During Therapy [facility] for tasks assessed/performed      Past Medical History  Diagnosis Date  . Crohn disease ([place])   . Thyroid disease     Past Surgical History  Procedure Laterality Date  . Abdominal hysterectomy  1987  . Thyroid surgery  1992  . Small intestine surgery  2012  . Colonoscopy  2013  . Colon surgery  06/06/2011    Resection of terminal ileum and previous ileocolonic anastomosis for recurrent Crohn's disease. 3 focal strictures identified.    There were no vitals filed for this visit.  Visit Diagnosis:  Muscle weakness  Stiffness of left shoulder joint  Shoulder joint stiffness, left  Left shoulder pain  Muscle weakness (generalized)      Subjective Assessment - 02/10/15 1115    Subjective Pt. reports having GI issues yesterday which limited her ability to complete HEP.  Pt. reports shoulder stiffness at this time but has been trying to reach overhead with less elbow flexion.     Patient is accompained by: Family member   Limitations Lifting;House hold activities   Patient Stated Goals Increase L shoulder ROM/ strength to improve pain-free mobility.    Currently in Pain? Yes   Pain Score 3    Pain Location Shoulder   Pain Orientation Left      OBJECTIVE: There ex: UBE 3 min. F/B warm up no charge.  Standing AAROM shoulder flexion and abduction with assist from wall ladder 5x each (difficulty).  Seated abduction and ER shoulder AAROM x 20 with verbal cues/ manual assist. Seated shoulder flexion AROM x 20. Supine bicep curls x 20 with 3# weights.  Supine AAROM with 20% guidance from PT L shoulder flexion and abduction.  Nautilus: seated lat. Pull downs 40# 20x/ standing tricep ext. 30# 20x/ scap. Retraction 30# 20x (PT assist with Nautilus ex. As needed).  Shoulder extension in standing x10.  Manual: In sitting/supine posture, STM to L upper trap./ deltoid/ biceps.  AA/PROM all planes to pain tolerable range 10x each. Pt response to tx for medical necessity: Pt benefits from strengthening to properly train shoulder combination movements and return to prior level of function.  Increase L shoulder AROM with decrease compensatory movement patterns.           PT Long Term Goals - 01/05/15 1438    PT LONG TERM GOAL #1   Title Pt. I with HEP to increase L shoulder AROM to [facility] as compared to R shoulder in sitting position to progress pain-free mobility.     Time 6   Period Weeks   Status New   PT LONG TERM GOAL #2   Title Pt. able to lift/carry 10# object with proper body mechanics and no c/o L shoulder pain to improve  mobility.    Time 6   Period Weeks   Status New   PT LONG TERM GOAL #3   Title Pt. will decrease QuickDASH to <30% to improve UE functional mobility/ sleeping.    Time 6   Period Weeks   Status New   PT LONG TERM GOAL #4   Title Pt. able to complete household cleaning/ dressing with no L shoulder pain or limitations.     Time 6   Period Weeks   Status New            Plan - 02/15/2015 1123    Clinical Impression Statement Pt. able to lie supine and complete shoulder press with 2# dumbbell.   Less L shoulder muscle guarding/ bicep assist with shoulder flexion and eccentric muscle control.  Pt. remains limited at 90 deg. shoulder flexion/ abd. with AROM in  standing posture.  Decrease L UT muscle compensation/R lateral leaning during forward reaching and shoulder flexion tasks.  Pt. able to manage hair with use of L UE at this time.     Pt will benefit from skilled therapeutic intervention in order to improve on the following deficits Hypomobility;Decreased strength;Impaired UE functional use;Pain;Decreased mobility;Decreased range of motion;Improper body mechanics;Decreased endurance;Decreased activity tolerance;Postural dysfunction   PT Frequency 2x / week   PT Duration 6 weeks   PT Treatment/Interventions ADLs/Self Care Home Management;Passive range of motion;Patient/family education;Cryotherapy;Electrical Stimulation;Iontophoresis 4mg /ml Dexamethasone;Therapeutic activities;Manual techniques;Therapeutic exercise;Moist Heat   PT Next Visit Plan progress motion in pain-free range/wand AAROM/Ball AAROM in standing/soft tissue.  REASSESS GOALS NEXT TX   Consulted and Agree with Plan of Care Patient          G-Codes - 02-15-15 1134    Functional Assessment Tool Used QuickDASH/ clinical judgement/ pain/ ROM   Functional Limitation Carrying, moving and handling objects   Carrying, Moving and Handling Objects Current Status [phone]) At least 40 percent but less than 60 percent impaired, limited or restricted   Carrying, Moving and Handling Objects Goal Status [id number]) At least 20 percent but less than 40 percent impaired, limited or restricted      Problem List Patient Active Problem List   Diagnosis Date Noted  . Rectal bleeding 06/10/2014  . AP (abdominal pain) 12/29/2013  . Ventral hernia without obstruction or gangrene 12/18/2013  . Crohn's disease ([place]) 12/18/2013   Pura Spice, PT, DPT # [id number]   02-15-15, 11:48 AM  [facility] [address] [place], [place], [address] Phone: [phone]   Fax:  [phone]  Name: Joanna Ortiz MRN: [id number] Date of Birth:  [birth year]

## 2015-02-15 ENCOUNTER — Ambulatory Visit: Payer: Federal, State, Local not specified - PPO | Admitting: Physical Therapy

## 2015-02-17 ENCOUNTER — Ambulatory Visit: Payer: Federal, State, Local not specified - PPO | Admitting: Physical Therapy

## 2015-02-17 DIAGNOSIS — M6281 Muscle weakness (generalized): Secondary | ICD-10-CM | POA: Diagnosis not present

## 2015-02-17 DIAGNOSIS — M25612 Stiffness of left shoulder, not elsewhere classified: Secondary | ICD-10-CM

## 2015-02-17 DIAGNOSIS — M25512 Pain in left shoulder: Secondary | ICD-10-CM

## 2015-02-18 NOTE — Therapy (Deleted)
[facility] [facility] [address] [place], [place], [address] Phone: [phone]   Fax:  [phone]  Physical Therapy Treatment  Patient Details  Name: HAMDA KLUTTS MRN: [id number] Date of Birth: [birth year] No Data Recorded  Encounter Date: 02-20-2015      PT End of Session - 02/18/15 1438    Visit Number 10   Number of Visits 18   Date for PT Re-Evaluation 03/17/15   Authorization - Visit Number 10   Authorization - Number of Visits 20   PT Start Time 0904   PT Stop Time 1001   PT Time Calculation (min) 57 min   Activity Tolerance Patient limited by pain;Patient tolerated treatment well      Past Medical History  Diagnosis Date  . Crohn disease ([place])   . Thyroid disease     Past Surgical History  Procedure Laterality Date  . Abdominal hysterectomy  1987  . Thyroid surgery  1992  . Small intestine surgery  2012  . Colonoscopy  2013  . Colon surgery  06/06/2011    Resection of terminal ileum and previous ileocolonic anastomosis for recurrent Crohn's disease. 3 focal strictures identified.    There were no vitals filed for this visit.  Visit Diagnosis:  Muscle weakness  Stiffness of left shoulder joint  Shoulder joint stiffness, left  Left shoulder pain  Muscle weakness (generalized)                                    PT Long Term Goals - 02/18/15 1439    PT LONG TERM GOAL #1   Title Pt. I with HEP to increase L shoulder AROM to [facility] as compared to R shoulder in sitting position to progress pain-free mobility.     Time 4   Period Weeks   Status Not Met   PT LONG TERM GOAL #2   Title Pt. able to lift/carry 10# object with proper body mechanics and no c/o L shoulder pain to improve mobility.    Time 4   Period Weeks   Status Partially Met   PT LONG TERM GOAL #3   Title Pt. will decrease QuickDASH to <30% to improve UE functional mobility/ sleeping.    Baseline 38.6% on 02-20-2023    Time 4   Period Weeks   Status Partially Met   PT LONG TERM GOAL #4   Title Pt. able to complete household cleaning/ dressing with no L shoulder pain or limitations.     Baseline improving but not pain free   Time 4   Period Weeks   Status Partially Met                 G-Codes - Feb 20, 2015 1441    Functional Assessment Tool Used QuickDASH/ clinical judgement/ pain/ ROM   Functional Limitation Carrying, moving and handling objects   Carrying, Moving and Handling Objects Current Status [phone]) At least 20 percent but less than 40 percent impaired, limited or restricted   Carrying, Moving and Handling Objects Goal Status ([id number]) At least 1 percent but less than 20 percent impaired, limited or restricted      Problem List Patient Active Problem List   Diagnosis Date Noted  . Rectal bleeding 06/10/2014  . AP (abdominal pain) 12/29/2013  . Ventral hernia without obstruction or gangrene 12/18/2013  . Crohn's disease ([place]) 12/18/2013    Pura Spice  02/18/2015, 2:42 PM  [facility] [address] [place], [place], [address] Phone: [phone]   Fax:  [phone]  Name: JASMARIE COPPOCK MRN: [id number] Date of Birth: 02/24/1945

## 2015-02-22 ENCOUNTER — Ambulatory Visit: Payer: Federal, State, Local not specified - PPO | Attending: Orthopedic Surgery | Admitting: Physical Therapy

## 2015-02-22 ENCOUNTER — Encounter: Payer: Medicare Other | Admitting: Physical Therapy

## 2015-02-22 DIAGNOSIS — M25512 Pain in left shoulder: Secondary | ICD-10-CM | POA: Diagnosis present

## 2015-02-22 DIAGNOSIS — M25612 Stiffness of left shoulder, not elsewhere classified: Secondary | ICD-10-CM | POA: Diagnosis present

## 2015-02-22 DIAGNOSIS — M6281 Muscle weakness (generalized): Secondary | ICD-10-CM | POA: Diagnosis present

## 2015-02-22 NOTE — Therapy (Addendum)
[facility] [address] [place], [place], [address] Phone: [phone]   Fax:  [phone]  Physical Therapy Treatment  Patient Details  Name: Joanna Ortiz MRN: [id number] Date of Birth: 24-Jun-1945 No Data Recorded  Encounter Date: 02/17/2015    Past Medical History  Diagnosis Date  . Crohn disease ([place])   . Thyroid disease     Past Surgical History  Procedure Laterality Date  . Abdominal hysterectomy  1987  . Thyroid surgery  1992  . Small intestine surgery  2012  . Colonoscopy  2013  . Colon surgery  06/06/2011    Resection of terminal ileum and previous ileocolonic anastomosis for recurrent Crohn's disease. 3 focal strictures identified.    There were no vitals filed for this visit.  Visit Diagnosis:  Muscle weakness  Stiffness of left shoulder joint  Shoulder joint stiffness, left  Left shoulder pain  Muscle weakness (generalized)    Pt. Reports 3/10 L shoulder pain currently.  Pt. states she has been frustrated with her GI issues and dizziness due to inner ear infection.  Pt. reports limited compliance with HEP for shoulder due to other issues.  Pt. is trying to stay active at home and use shoulder more and more with household tasks.      OBJECTIVE: There ex: UBE 3 min. F/B warm up no charge. Standing AAROM shoulder flexion and abduction with assist from wall ladder 5x each (difficulty). Seated abduction and ER shoulder AAROM x 20 with verbal cues/ manual assist. Seated shoulder flexion AROM x 20. Supine bicep curls x 20 with 3# weights. Supine AAROM with 20% guidance from PT L shoulder flexion and abduction. Nautilus: seated lat. Pull downs 40# 20x/ standing tricep ext. 30# 20x/ scap. Retraction 30# 20x (PT assist with Nautilus ex. As needed). Shoulder extension in standing x10. Manual: In sitting/supine posture, STM to L upper trap./ deltoid/ biceps. AA/PROM all planes to pain tolerable range 10x  each. Pt response to tx for medical necessity: Pt benefits from strengthening to properly train shoulder combination movements and return to prior level of function. Increase L shoulder AROM with decrease compensatory movement patterns.      Pt. continues to show slow but consistent progress with L shoulder AROM/ strengthening in standing.  Moderate L shoulder/scapular muscle weakness noted during light resistance tasks with muscle fatigue noted during several repetitions.  Pt. able to flex L shoulder to 90 deg. actively with no UT compensatory mvmts.  Pt. will benefit from continued PT services to work toward goals and improve functional mobility.             PT Long Term Goals - 02/18/15 1439    PT LONG TERM GOAL #1   Title Pt. I with HEP to increase L shoulder AROM to [facility] as compared to R shoulder in sitting position to progress pain-free mobility.     Time 4   Period Weeks   Status Not Met   PT LONG TERM GOAL #2   Title Pt. able to lift/carry 10# object with proper body mechanics and no c/o L shoulder pain to improve mobility.    Time 4   Period Weeks   Status Partially Met   PT LONG TERM GOAL #3   Title Pt. will decrease QuickDASH to <30% to improve UE functional mobility/ sleeping.    Baseline 38.6% on 12/29   Time 4   Period Weeks   Status Partially Met   PT LONG TERM  GOAL #4   Title Pt. able to complete household cleaning/ dressing with no L shoulder pain or limitations.     Baseline improving but not pain free   Time 4   Period Weeks   Status Partially Met        Problem List Patient Active Problem List   Diagnosis Date Noted  . Rectal bleeding 06/10/2014  . AP (abdominal pain) 12/29/2013  . Ventral hernia without obstruction or gangrene 12/18/2013  . Crohn's disease ([place]) 12/18/2013   Pura Spice, PT, DPT # [id number]   02/22/2015, 10:50 AM  [facility] [address] [place], [place],  [address] Phone: [phone]   Fax:  [phone]  Name: Joanna Ortiz MRN: [id number] Date of Birth: [birth year]

## 2015-02-22 NOTE — Addendum Note (Signed)
Addended by: Dorcas Carrow C on: 02/22/2015 10:57 AM   Modules accepted: Orders

## 2015-02-23 ENCOUNTER — Encounter: Payer: Self-pay | Admitting: Physical Therapy

## 2015-02-23 NOTE — Therapy (Signed)
[place] [facility] [address] [place], [place], [address] Phone: [phone]   Fax:  [phone]  Physical Therapy Treatment  Patient Details  Name: Joanna Ortiz MRN: [id number] Date of Birth: 1945-09-13 No Data Recorded  Encounter Date: 02/22/2015      PT End of Session - 02/23/15 0832    Visit Number 11   Number of Visits 18   Date for PT Re-Evaluation 03/17/15   Authorization - Visit Number 11   Authorization - Number of Visits 20   PT Start Time 2423   PT Stop Time 5361   PT Time Calculation (min) 50 min   Activity Tolerance Patient limited by pain;Patient tolerated treatment well   Behavior During Therapy [facility] for tasks assessed/performed      Past Medical History  Diagnosis Date  . Crohn disease ([place])   . Thyroid disease     Past Surgical History  Procedure Laterality Date  . Abdominal hysterectomy  1987  . Thyroid surgery  1992  . Small intestine surgery  2012  . Colonoscopy  2013  . Colon surgery  06/06/2011    Resection of terminal ileum and previous ileocolonic anastomosis for recurrent Crohn's disease. 3 focal strictures identified.    There were no vitals filed for this visit.  Visit Diagnosis:  Muscle weakness  Stiffness of left shoulder joint  Shoulder joint stiffness, left  Left shoulder pain  Muscle weakness (generalized)      Subjective Assessment - 02/23/15 0831    Subjective Pt reports arm feeling ok. Pt reports doing some of HEP over the weekend. Pt states that she is over her ear infection.   Patient is accompained by: Family member   Limitations Lifting;House hold activities   Patient Stated Goals Increase L shoulder ROM/ strength to improve pain-free mobility.    Currently in Pain? Yes   Pain Score 3    Pain Location Shoulder   Pain Orientation Left     OBJECTIVE: There ex: UBE F/B 5 mins warmup (no charge). Standing wand AAROM: Shoulder IR/extension x 30 each direction. Standing  bicep curls/tricep extension/shoulder adduction with 3 # weights 10 x 2 each. Nautilus: Lat pull down/chest press/scapular retraction/shoulder flexion with bar 20# x 15. Nautilus with handles: adduction/flexion 10# x 15. Sidelying, ER with 3# weights x 20. Sidelying adduction with no weights x 20. Holding arm in flexion and abduction for 5 second with AAROM to reach top point of motion.    Pt response to tx for medical necessity: Pt benefits from strengthening to address weakness and ROM deficits.       PT Long Term Goals - 02/18/15 1439    PT LONG TERM GOAL #1   Title Pt. I with HEP to increase L shoulder AROM to [facility] as compared to R shoulder in sitting position to progress pain-free mobility.     Time 4   Period Weeks   Status Not Met   PT LONG TERM GOAL #2   Title Pt. able to lift/carry 10# object with proper body mechanics and no c/o L shoulder pain to improve mobility.    Time 4   Period Weeks   Status Partially Met   PT LONG TERM GOAL #3   Title Pt. will decrease QuickDASH to <30% to improve UE functional mobility/ sleeping.    Baseline 38.6% on 12/29   Time 4   Period Weeks   Status Partially Met   PT LONG TERM GOAL #4  Title Pt. able to complete household cleaning/ dressing with no L shoulder pain or limitations.     Baseline improving but not pain free   Time 4   Period Weeks   Status Partially Met            Plan - 02/23/15 7622    Clinical Impression Statement Pt shows continued but slow progress towards her goals with functional mobility. Pt is limited secondary to pain. Pt demonstrates good form with Nautilus and uses R UE to compensate for reaching up for L UE. Pt uses triceps and upper trap to compensate for L shoulder weakness.   Pt will benefit from skilled therapeutic intervention in order to improve on the following deficits Hypomobility;Decreased strength;Impaired UE functional use;Pain;Decreased mobility;Decreased range of motion;Improper body  mechanics;Decreased endurance;Decreased activity tolerance;Postural dysfunction   Rehab Potential Good   PT Frequency 2x / week   PT Duration 6 weeks   PT Treatment/Interventions ADLs/Self Care Home Management;Passive range of motion;Patient/family education;Cryotherapy;Electrical Stimulation;Iontophoresis '4mg'$ /ml Dexamethasone;Therapeutic activities;Manual techniques;Therapeutic exercise;Moist Heat   PT Next Visit Plan strengthening/stability/functional range   PT Home Exercise Plan continue current plan   Consulted and Agree with Plan of Care Patient        Problem List Patient Active Problem List   Diagnosis Date Noted  . Rectal bleeding 06/10/2014  . AP (abdominal pain) 12/29/2013  . Ventral hernia without obstruction or gangrene 12/18/2013  . Crohn's disease ([place]) 12/18/2013    Lavone Neri , SPT  02/23/2015, 8:34 AM  [facility] [address] [place], [place], [address] Phone: [phone]   Fax:  [phone]  Name: Joanna Ortiz MRN: [id number] Date of Birth: 22-Aug-1945

## 2015-02-24 ENCOUNTER — Encounter: Payer: Medicare Other | Admitting: Physical Therapy

## 2015-02-24 ENCOUNTER — Encounter: Payer: Self-pay | Admitting: Physical Therapy

## 2015-02-24 ENCOUNTER — Ambulatory Visit: Payer: Federal, State, Local not specified - PPO | Admitting: Physical Therapy

## 2015-02-24 DIAGNOSIS — M6281 Muscle weakness (generalized): Secondary | ICD-10-CM | POA: Diagnosis not present

## 2015-02-24 DIAGNOSIS — M25512 Pain in left shoulder: Secondary | ICD-10-CM

## 2015-02-24 DIAGNOSIS — M25612 Stiffness of left shoulder, not elsewhere classified: Secondary | ICD-10-CM

## 2015-02-24 NOTE — Therapy (Signed)
[place] [facility] [address] [place], [place], [address] Phone: [phone]   Fax:  [phone]  Physical Therapy Treatment  Patient Details  Name: Joanna Ortiz MRN: [id number] Date of Birth: Sep 28, 1945 No Data Recorded  Encounter Date: 02/24/2015      PT End of Session - 02/24/15 1725    Visit Number 12   Number of Visits 18   Date for PT Re-Evaluation 03/17/15   Authorization - Visit Number 12   Authorization - Number of Visits 20   PT Start Time 9833   PT Stop Time 1600   PT Time Calculation (min) 47 min   Activity Tolerance Patient tolerated treatment well;No increased pain   Behavior During Therapy [facility] for tasks assessed/performed      Past Medical History  Diagnosis Date  . Crohn disease ([place])   . Thyroid disease     Past Surgical History  Procedure Laterality Date  . Abdominal hysterectomy  1987  . Thyroid surgery  1992  . Small intestine surgery  2012  . Colonoscopy  2013  . Colon surgery  06/06/2011    Resection of terminal ileum and previous ileocolonic anastomosis for recurrent Crohn's disease. 3 focal strictures identified.    There were no vitals filed for this visit.  Visit Diagnosis:  Muscle weakness  Stiffness of left shoulder joint  Shoulder joint stiffness, left  Left shoulder pain  Muscle weakness (generalized)      Subjective Assessment - 02/24/15 1724    Subjective Pt reports her L shoulder being sore from watching her grandchildren in the AM. Pt states no new complaints and that she has been feeling well since last session.    Patient is accompained by: Family member   Limitations Lifting;House hold activities   Patient Stated Goals Increase L shoulder ROM/ strength to improve pain-free mobility.    Currently in Pain? Yes   Pain Score 2    Pain Location Shoulder   Pain Orientation Left      OBJECTIVE: There ex: UBE 4 mins (no charge). Standing horizontal abduction x 20 with 3#.  D1/D2 x 30 D1 with 1 # weight. AAROM with static hold with 1# dumbbell in flexion and abduction. Nautilus: lat pull down/scapular retraction/triceps/shoulder flexion x 30 with 30#. Bicep curls standing with 3# x 30. Manual: STM to L upper trap, tightness noted. PROM to L shoulder in flexion, abduction, scaption with stretch not pain noted at end range.  Pt response to tx for medical necessity: Pt benefits from strengthening to regain functional mobility. Pt benefits from manual to increase pain free ROM. No increase pain with increase in weight or reps noted today.       PT Long Term Goals - 02/18/15 1439    PT LONG TERM GOAL #1   Title Pt. I with HEP to increase L shoulder AROM to [facility] as compared to R shoulder in sitting position to progress pain-free mobility.     Time 4   Period Weeks   Status Not Met   PT LONG TERM GOAL #2   Title Pt. able to lift/carry 10# object with proper body mechanics and no c/o L shoulder pain to improve mobility.    Time 4   Period Weeks   Status Partially Met   PT LONG TERM GOAL #3   Title Pt. will decrease QuickDASH to <30% to improve UE functional mobility/ sleeping.    Baseline 38.6% on 12/29   Time 4  Period Weeks   Status Partially Met   PT LONG TERM GOAL #4   Title Pt. able to complete household cleaning/ dressing with no L shoulder pain or limitations.     Baseline improving but not pain free   Time 4   Period Weeks   Status Partially Met               Plan - 02/24/15 1725    Clinical Impression Statement Pt demonstrates increase muscle endurance in L shoulder musculature today as compared to previous sessions. Pt was able to hold L arm in flexion without dropping for 10 seconds. Pt demonstrates ability to increase weights on Nautilus without any complaints of pain. PROM was tolerated without any increase in pain. Pt reports feeling a stretch at PROM endrange.    Pt will benefit from skilled therapeutic intervention in order to improve on  the following deficits Hypomobility;Decreased strength;Impaired UE functional use;Pain;Decreased mobility;Decreased range of motion;Improper body mechanics;Decreased endurance;Decreased activity tolerance;Postural dysfunction   Rehab Potential Good   PT Frequency 2x / week   PT Duration 6 weeks   PT Treatment/Interventions ADLs/Self Care Home Management;Passive range of motion;Patient/family education;Cryotherapy;Electrical Stimulation;Iontophoresis 8m/ml Dexamethasone;Therapeutic activities;Manual techniques;Therapeutic exercise;Moist Heat   PT Next Visit Plan functional range/body blade/stabilization/strengthening/household tasks/overhead reaching.   PT Home Exercise Plan continue current plan   Consulted and Agree with Plan of Care Patient        Problem List Patient Active Problem List   Diagnosis Date Noted  . Rectal bleeding 06/10/2014  . AP (abdominal pain) 12/29/2013  . Ventral hernia without obstruction or gangrene 12/18/2013  . [place] 12/18/2013    MLavone Neri SPT SGeorgina Pillion SPT 02/24/2015, 5:28 PM  [place] [facility] [place] [address]: [phone]  Fax:  [phone] Name: Joanna MCNEELEYMRN: [id number] of Birth: [birth year]

## 2015-03-01 ENCOUNTER — Encounter: Payer: Medicare Other | Admitting: Physical Therapy

## 2015-03-03 ENCOUNTER — Ambulatory Visit: Payer: Federal, State, Local not specified - PPO | Admitting: Physical Therapy

## 2015-03-03 ENCOUNTER — Encounter: Payer: Self-pay | Admitting: Physical Therapy

## 2015-03-03 DIAGNOSIS — M25612 Stiffness of left shoulder, not elsewhere classified: Secondary | ICD-10-CM

## 2015-03-03 DIAGNOSIS — M6281 Muscle weakness (generalized): Secondary | ICD-10-CM | POA: Diagnosis not present

## 2015-03-03 DIAGNOSIS — M25512 Pain in left shoulder: Secondary | ICD-10-CM

## 2015-03-03 NOTE — Therapy (Signed)
[facility] [address] [place], [place], [address] Phone: [phone]   Fax:  [phone]  Physical Therapy Treatment  Patient Details  Name: Joanna Ortiz MRN: [id number] Date of Birth: April 14, 1945 No Data Recorded  Encounter Date: 03/03/2015      PT End of Session - 03/03/15 1409    Visit Number 13   Number of Visits 18   Date for PT Re-Evaluation 03/17/15   Authorization - Visit Number 13   Authorization - Number of Visits 20   PT Start Time 1024   PT Stop Time 1110   PT Time Calculation (min) 46 min   Activity Tolerance Patient tolerated treatment well;No increased pain   Behavior During Therapy [facility] for tasks assessed/performed      Past Medical History  Diagnosis Date  . Crohn disease ([place])   . Thyroid disease     Past Surgical History  Procedure Laterality Date  . Abdominal hysterectomy  1987  . Thyroid surgery  1992  . Small intestine surgery  2012  . Colonoscopy  2013  . Colon surgery  06/06/2011    Resection of terminal ileum and previous ileocolonic anastomosis for recurrent Crohn's disease. 3 focal strictures identified.    There were no vitals filed for this visit.  Visit Diagnosis:  Stiffness of left shoulder joint  Muscle weakness  Left shoulder pain      Subjective Assessment - 03/03/15 1409    Subjective Pt states that her L shoulder is feeling better and that she has been able to move it more over the past week. Pt denies any pain. Pt reports a R ear fungal infection.    Patient is accompained by: Family member   Limitations Lifting;House hold activities   Patient Stated Goals Increase L shoulder ROM/ strength to improve pain-free mobility.    Currently in Pain? No/denies     OBJECTIVE: UBE F/B warmup no charge. There ex: Standing Nautilus: shoulder extension/chest press/rows/tricep with 30# x 30 each direction. No upper trap compensation noted with exercise and no rocking of thoracic  spine noted. Body blade with B UE 3 x 30 seconds. Pt complains of muscle fatigue with body blade. Supine AROM and seated AROM. Manual: STM to L bicep/anterior deltoid with increased tenderness noted. PROM in flexion/abduction/IR/ER as pain allowed.   Pt response to tx for medical necessity: Pt benefits from strengthening to regain functional mobility of UE. Pt benefits from manual techniques to increase available range of motion in L shoulder.          PT Long Term Goals - 02/18/15 1439    PT LONG TERM GOAL #1   Title Pt. I with HEP to increase L shoulder AROM to [facility] as compared to R shoulder in sitting position to progress pain-free mobility.     Time 4   Period Weeks   Status Not Met   PT LONG TERM GOAL #2   Title Pt. able to lift/carry 10# object with proper body mechanics and no c/o L shoulder pain to improve mobility.    Time 4   Period Weeks   Status Partially Met   PT LONG TERM GOAL #3   Title Pt. will decrease QuickDASH to <30% to improve UE functional mobility/ sleeping.    Baseline 38.6% on 12/29   Time 4   Period Weeks   Status Partially Met   PT LONG TERM GOAL #4   Title Pt. able to complete household cleaning/ dressing  with no L shoulder pain or limitations.     Baseline improving but not pain free   Time 4   Period Weeks   Status Partially Met               Plan - 03/03/15 1410    Clinical Impression Statement Pt is able to lift L shoulder in supine to 95 degrees of AROM in shoulder flexion. Pt demonstrats an increase in ability to isometrically hold her L arm against gravitiy. Pt is able to IR to place her L arm in her back pocket. Pt is able to perform horizontal abduction with no complaints of increased pain. Pt performs Nautilus there ex with no compensatory strategies. Pt is making slow but steady progress towards her goals.   Pt will benefit from skilled therapeutic intervention in order to improve on the following deficits Hypomobility;Decreased  strength;Impaired UE functional use;Pain;Decreased mobility;Decreased range of motion;Improper body mechanics;Decreased endurance;Decreased activity tolerance;Postural dysfunction   Rehab Potential Good   PT Frequency 2x / week   PT Duration 6 weeks   PT Treatment/Interventions ADLs/Self Care Home Management;Passive range of motion;Patient/family education;Cryotherapy;Electrical Stimulation;Iontophoresis '4mg'$ /ml Dexamethasone;Therapeutic activities;Manual techniques;Therapeutic exercise;Moist Heat   PT Next Visit Plan overhead reaching/functional home tasks/ RECERT Due to [facility] trip    [place] continue current plan   Consulted and Agree with Plan of Care Patient        Problem List Patient Active Problem List   Diagnosis Date Noted  . Rectal bleeding 06/10/2014  . AP (abdominal pain) 12/29/2013  . Ventral hernia without obstruction or gangrene 12/18/2013  . Crohn's disease ([place]) 12/18/2013   Pura Spice, PT, DPT # [phone]   03/04/2015, 1:33 PM  [facility] [address] [place], [place], [address] Phone: [phone]   Fax:  [phone]  Name: Joanna Ortiz MRN: [id number] Date of Birth: [birth year]

## 2015-03-07 ENCOUNTER — Encounter: Payer: Self-pay | Admitting: Physical Therapy

## 2015-03-07 ENCOUNTER — Ambulatory Visit: Payer: Federal, State, Local not specified - PPO | Admitting: Physical Therapy

## 2015-03-07 DIAGNOSIS — M25612 Stiffness of left shoulder, not elsewhere classified: Secondary | ICD-10-CM

## 2015-03-07 DIAGNOSIS — M25512 Pain in left shoulder: Secondary | ICD-10-CM

## 2015-03-07 DIAGNOSIS — M6281 Muscle weakness (generalized): Secondary | ICD-10-CM

## 2015-03-07 NOTE — Therapy (Signed)
[place] [facility] [address] [place], [place], [address] Phone: [phone]   Fax:  [phone]  Physical Therapy Treatment  Patient Details  Name: Joanna Ortiz MRN: [id number] Date of Birth: [birth year] No Data Recorded  Encounter Date: 03/07/2015      PT End of Session - 03/07/15 1231    Visit Number 14   Number of Visits 18   Date for PT Re-Evaluation 03/17/15   Authorization - Visit Number 13   Authorization - Number of Visits 20   PT Start Time 3662   PT Stop Time 9476   PT Time Calculation (min) 41 min   Activity Tolerance Patient tolerated treatment well;No increased pain   Behavior During Therapy [facility] for tasks assessed/performed      Past Medical History  Diagnosis Date  . Crohn disease ([place])   . Thyroid disease     Past Surgical History  Procedure Laterality Date  . Abdominal hysterectomy  1987  . Thyroid surgery  1992  . Small intestine surgery  2012  . Colonoscopy  2013  . Colon surgery  06/06/2011    Resection of terminal ileum and previous ileocolonic anastomosis for recurrent Crohn's disease. 3 focal strictures identified.    There were no vitals filed for this visit.  Visit Diagnosis:  Stiffness of left shoulder joint  Muscle weakness  Left shoulder pain  Shoulder joint stiffness, left  Muscle weakness (generalized)      Subjective Assessment - 03/07/15 1226    Subjective Pt reports stiffness in L shoulder, but no complaints of pain. Pt reports still having pain in R ear due to ear infection. Pt states she has been performing HEP 3 x day with the expception of Sunday due to being out of the house. Pt reports L shoulder pain with sidelying ER with 3# weight.   Patient is accompained by: Family member   Limitations Lifting;House hold activities   Patient Stated Goals Increase L shoulder ROM/ strength to improve pain-free mobility.    Currently in Pain? No/denies   Pain Onset 1 to 4 weeks ago         Objective: UBE forwards for 2 mins and backwards for 2 mins (warm-up/no charge). Ther ex: standing bicep curls with 3# weight x 20. Standing flexion and abduction with 2# weight x 20. Nautilus: chest press, scapular retraction, lat pulldown, tricep extension x 20 at 30# weight with bar. Sidelying ER with 3# bar x 15 limited by pain. Manual: PROM flexion, abduction, scaption, horizontal adduction x 3 for 30 seconds.  Frequent cuing for UT compensation in sitting/ standing posture.  Discuss HEP for beach trip.   Pt response to treatment for medical necessity: Pt benefits from skilled PT to increase strength in L shoulder in order to complete functional tasks at home. Pt benefits from manual in order to increase motion and functional mobility.  No increase c/o pain in shoulder.  Moderate shoulder stiffness t/o tx. But not pain focused.        PT Long Term Goals - 02/18/15 1439    PT LONG TERM GOAL #1   Title Pt. I with HEP to increase L shoulder AROM to [facility] as compared to R shoulder in sitting position to progress pain-free mobility.     Time 4   Period Weeks   Status Not Met   PT LONG TERM GOAL #2   Title Pt. able to lift/carry 10# object with proper body mechanics and no c/o  L shoulder pain to improve mobility.    Time 4   Period Weeks   Status Partially Met   PT LONG TERM GOAL #3   Title Pt. will decrease QuickDASH to <30% to improve UE functional mobility/ sleeping.    Baseline 38.6% on 12/29   Time 4   Period Weeks   Status Partially Met   PT LONG TERM GOAL #4   Title Pt. able to complete household cleaning/ dressing with no L shoulder pain or limitations.     Baseline improving but not pain free   Time 4   Period Weeks   Status Partially Met           Plan - 03/07/15 1235    Clinical Impression Statement Pt demonstrates good form with Nautilus with minimal upper trap compensation. Pt needed verbal cueing with bicep curls to focus on a slow and controlled motion  instead of a swinging motion. Pt is able to complete shoulder flexion and abduction with 2# weight stopping just before she starts to feel upper trap compensation.   Pt will benefit from skilled therapeutic intervention in order to improve on the following deficits Hypomobility;Decreased strength;Impaired UE functional use;Pain;Decreased mobility;Decreased range of motion;Improper body mechanics;Decreased endurance;Decreased activity tolerance;Postural dysfunction   Rehab Potential Good   PT Frequency 2x / week   PT Duration 6 weeks   PT Treatment/Interventions ADLs/Self Care Home Management;Passive range of motion;Patient/family education;Cryotherapy;Electrical Stimulation;Iontophoresis 79m/ml Dexamethasone;Therapeutic activities;Manual techniques;Therapeutic exercise;Moist Heat   PT Next Visit Plan overhead reaching/functional home tasks/strenghtening/ [facility]    [place] current plan   Consulted and Agree with Plan of Care Patient        Problem List Patient Active Problem List   Diagnosis Date Noted  . Rectal bleeding 06/10/2014  . AP (abdominal pain) 12/29/2013  . Ventral hernia without obstruction or gangrene 12/18/2013  . [place] 12/18/2013    SGeorgina Pillion SPT 03/07/2015, 12:44 PM  [place] [facility] [place] [address]: [phone]  Fax:  [phone] Name: Joanna Ortiz: [id number] of Birth: [birth year]

## 2015-03-09 ENCOUNTER — Ambulatory Visit: Payer: Federal, State, Local not specified - PPO | Admitting: Physical Therapy

## 2015-03-15 ENCOUNTER — Ambulatory Visit: Payer: Federal, State, Local not specified - PPO | Admitting: Physical Therapy

## 2015-03-15 ENCOUNTER — Encounter: Payer: Self-pay | Admitting: Physical Therapy

## 2015-03-15 DIAGNOSIS — M6281 Muscle weakness (generalized): Secondary | ICD-10-CM

## 2015-03-15 DIAGNOSIS — M25512 Pain in left shoulder: Secondary | ICD-10-CM

## 2015-03-15 DIAGNOSIS — M25612 Stiffness of left shoulder, not elsewhere classified: Secondary | ICD-10-CM

## 2015-03-15 NOTE — Therapy (Signed)
[facility] [address] [place], [place], [address] Phone: [phone]   Fax:  [phone]  Physical Therapy Treatment  Patient Details  Name: Joanna Ortiz MRN: [id number] Date of Birth: November 04, 1945 No Data Recorded  Encounter Date: 03/15/2015      PT End of Session - 03/15/15 1216    Visit Number 15   Number of Visits 18   Date for PT Re-Evaluation 03/17/15   Authorization - Visit Number 15   Authorization - Number of Visits 20   PT Start Time 3474   PT Stop Time 2595   PT Time Calculation (min) 54 min   Activity Tolerance Patient tolerated treatment well;No increased pain   Behavior During Therapy [facility] for tasks assessed/performed      Past Medical History  Diagnosis Date  . Crohn disease ([place])   . Thyroid disease     Past Surgical History  Procedure Laterality Date  . Abdominal hysterectomy  1987  . Thyroid surgery  1992  . Small intestine surgery  2012  . Colonoscopy  2013  . Colon surgery  06/06/2011    Resection of terminal ileum and previous ileocolonic anastomosis for recurrent Crohn's disease. 3 focal strictures identified.    There were no vitals filed for this visit.  Visit Diagnosis:  No diagnosis found.      Subjective Assessment - 03/15/15 1212    Subjective Pt continues to report stiffness in L shoulder. Pt states she still has discomfort in R ear from ear infection and is going to MD sometime this week. Pt also reports having trouble with gripping jars at home stating she was carrying a jar at home and "it just slipped out of my hand."   Patient is accompained by: Family member   Limitations Lifting;House hold activities   Patient Stated Goals Increase L shoulder ROM/ strength to improve pain-free mobility.    Currently in Pain? Yes   Pain Score 1    Pain Location Shoulder   Pain Orientation Left   Pain Descriptors / Indicators Aching   Pain Onset 1 to 4 weeks ago       Objective:  UBE: 2 mins forwards and 2 mins backwards (no charge/warm up). Manual: PROM shoulder flex. and abd. x 4 for 20 seconds with pt reporting a pec stretch at end range. Shoulder scaption and HADD x 20 for 20 seconds. Soft tissue massage to L upper trap/shoulder region x 5 mins. Ther ex: shoulder flex with 1# weight x 20. Shoulder abd. with 1# weight x 20 with verbal cueing to keep eyes forward and decrease compensatory L towards R side. Nautilus: lat pulldown with 20# x 20 with pt demonstrating good eccentric control on way up. Tricep ext. With 30# x 20 with verbal and tactile cueing to keep elbow next to body and to focus on straightening only at elbow. Chest press and scapular retraction with 30# weight x 20. Rhythmic stabilizations B shoulder x 3 for 30 seconds each.   Pt response to treatment for medical necessity: Pt reports L shoulder fatigue after treatment session but no increase in pain. Pt benefits from skilled PT to increase shoulder AROM in order to complete ADLs such as washing hair and putting dishes away in high cabinets. Pt will also benefits from strengthening shoulder muscles in order to lift heavy items and complete household tasks.  Grip strength R: 33.1 lb L: 29.1 lb        PT Long Term  Goals - 02/18/15 1439    PT LONG TERM GOAL #1   Title Pt. I with HEP to increase L shoulder AROM to [facility] as compared to R shoulder in sitting position to progress pain-free mobility.     Time 4   Period Weeks   Status Not Met   PT LONG TERM GOAL #2   Title Pt. able to lift/carry 10# object with proper body mechanics and no c/o L shoulder pain to improve mobility.    Time 4   Period Weeks   Status Partially Met   PT LONG TERM GOAL #3   Title Pt. will decrease QuickDASH to <30% to improve UE functional mobility/ sleeping.    Baseline 38.6% on 12/29   Time 4   Period Weeks   Status Partially Met   PT LONG TERM GOAL #4   Title Pt. able to complete household cleaning/ dressing with no L  shoulder pain or limitations.     Baseline improving but not pain free   Time 4   Period Weeks   Status Partially Met            Plan - 03/15/15 1217    Clinical Impression Statement Pt requires verbal cueing to keep eyes forwad and to decrease lean towards R side with standing shoulder abd. Pt demonstrated good form and eccentric control with lat pulldown. Pt demonstrated good form with all Nautilus exercises with minimal upper trap compensation. Pt reports slight L shoulder jt pain with rhythmic stabilizations and reports fatigue after first set but was able to complete all 3 sets. Grip strength: R- 33.1 lb  L- 29.1 lb.   Pt will benefit from skilled therapeutic intervention in order to improve on the following deficits Hypomobility;Decreased strength;Impaired UE functional use;Pain;Decreased mobility;Decreased range of motion;Improper body mechanics;Decreased endurance;Decreased activity tolerance;Postural dysfunction   Rehab Potential Good   PT Frequency 2x / week   PT Duration 6 weeks   PT Treatment/Interventions ADLs/Self Care Home Management;Passive range of motion;Patient/family education;Cryotherapy;Electrical Stimulation;Iontophoresis '4mg'$ /ml Dexamethasone;Therapeutic activities;Manual techniques;Therapeutic exercise;Moist Heat   PT Next Visit Plan overhead reaching with cones/functional home tasks/strenghtening/ RECERT (reassess goals and progress made)    PT Home Exercise Plan continue current plan   Consulted and Agree with Plan of Care Patient        Problem List Patient Active Problem List   Diagnosis Date Noted  . Rectal bleeding 06/10/2014  . AP (abdominal pain) 12/29/2013  . Ventral hernia without obstruction or gangrene 12/18/2013  . Crohn's disease ([place]) 12/18/2013    Georgina Pillion, SPT 03/15/2015, 12:40 PM  [place] [facility] [address] [place], [place], [address] Phone: [phone]   Fax:  [phone]  Name:  Joanna Ortiz MRN: [id number] Date of Birth: 1945-05-30

## 2015-03-17 ENCOUNTER — Encounter: Payer: Self-pay | Admitting: Physical Therapy

## 2015-03-17 ENCOUNTER — Ambulatory Visit: Payer: Federal, State, Local not specified - PPO | Admitting: Physical Therapy

## 2015-03-17 DIAGNOSIS — M25512 Pain in left shoulder: Secondary | ICD-10-CM

## 2015-03-17 DIAGNOSIS — M6281 Muscle weakness (generalized): Secondary | ICD-10-CM | POA: Diagnosis not present

## 2015-03-17 DIAGNOSIS — M25612 Stiffness of left shoulder, not elsewhere classified: Secondary | ICD-10-CM

## 2015-03-17 NOTE — Patient Instructions (Signed)
    Issued scap. Retraction/ biceps curls/ tricep ext./ lat. Pull downs (see handouts).

## 2015-03-17 NOTE — Therapy (Signed)
[facility] [address] [place], [place], [address] Phone: [phone]   Fax:  [phone]  Physical Therapy Treatment  Patient Details  Name: Joanna Ortiz MRN: [id number] Date of Birth: March 06, 1945 No Data Recorded  Encounter Date: 03/17/2015      PT End of Session - 03/17/15 1233    Visit Number 16   Number of Visits 23   Date for PT Re-Evaluation 04/14/15   Authorization - Visit Number 16   Authorization - Number of Visits 25   PT Start Time 4010   PT Stop Time 2725   PT Time Calculation (min) 65 min   Activity Tolerance Patient tolerated treatment well;No increased pain   Behavior During Therapy [facility] for tasks assessed/performed      Past Medical History  Diagnosis Date  . Crohn disease ([place])   . Thyroid disease     Past Surgical History  Procedure Laterality Date  . Abdominal hysterectomy  1987  . Thyroid surgery  1992  . Small intestine surgery  2012  . Colonoscopy  2013  . Colon surgery  06/06/2011    Resection of terminal ileum and previous ileocolonic anastomosis for recurrent Crohn's disease. 3 focal strictures identified.    There were no vitals filed for this visit.  Visit Diagnosis:  Muscle weakness  Stiffness of left shoulder joint  Left shoulder pain      Subjective Assessment - 03/17/15 1230    Subjective Pt reports slight 1/10 pain in L shoulder but no new complaints of pain. Pt goes to see doctor on Monday, 1/30, to evaluate progress. Pt states she has been doing her HEP.    Patient is accompained by: Family member   Limitations Lifting;House hold activities   Patient Stated Goals Increase L shoulder ROM/ strength to improve pain-free mobility.    Currently in Pain? Yes   Pain Score 1    Pain Location Shoulder   Pain Orientation Left   Pain Descriptors / Indicators Aching   Pain Type Surgical pain   Pain Onset More than a month ago   Pain Frequency Intermittent     Objective:  UBE: 2 mins forward and 2 mins backward. (no charge/warm-up) Manual: PROM shoulder flex and abd x 3 for 30 seconds. Ther ex: Shoulder flexion and abduction with red tb x 20 with moderate verbal and tactile cueing to keep elbows straight. Tricep extension and bicep curls with red tb x 20. Lat pulldown and scapular retraction with red tb x 20.   Pt response to treatment for medical necessity: Pt benefits from skilled PT to increase L shoulder AROM with minimal upper trap compensation to complete ADLs, such as washing hair, without pain. Pt will also benefit from skilled PT to increase strength in order to return to prior level of function and improve functional mobility.  L shoulder AROM in supine: -Flex: 105 deg. Abd: 85 deg. ER: 24 deg. IR: 69 deg.  L shoulder AROM in seated:  -Flex: 95 deg. Abd: 84 deg.  L shoulder MMT: -Flex: 3/5. Abd: 3/5.  L elbow MMT:  -Flex: 5/5. Ext: 4/5.           PT Education - 03/17/15 1102    Education provided Yes   Education Details See RTB ex. program   Person(s) Educated Patient   Methods Explanation;Demonstration;Verbal cues   Comprehension Verbalized understanding;Returned demonstration;Verbal cues required           PT Long Term Goals -  03-23-15 1251    PT LONG TERM GOAL #1   Title Pt. I with HEP to increase L shoulder AROM to [facility] as compared to R shoulder in sitting position to progress pain-free mobility.     Time 4   Period Weeks   Status Not Met   PT LONG TERM GOAL #2   Title Pt. able to lift/carry 10# object with proper body mechanics and no c/o L shoulder pain to improve mobility.    Baseline achieved 03/22/2022   Time 4   Period Weeks   Status Achieved   PT LONG TERM GOAL #3   Title Pt. will decrease QuickDASH to <20% to improve UE functional mobility/ sleeping.    Baseline 38.6% on 12/29. 25% on 2022-03-22.   Time 4   Period Weeks   Status New   PT LONG TERM GOAL #4   Title Pt. able to complete household cleaning/ dressing with no L  shoulder pain or limitations.     Baseline improving but not pain free   Time 4   Period Weeks   Status Partially Met   PT LONG TERM GOAL #5   Title Pt will increase L shoulder flexion AROM to >110 deg with minimal upper trap compensation in order wash hair.   Baseline L shoulder flexion: 105 deg 23-Mar-2015   Time 4   Period Weeks   Status New   Additional Long Term Goals   Additional Long Term Goals Yes   PT LONG TERM GOAL #6   Title Pt will increase L shoulder strength MMT to 4/5 in order to return to prior level of function.   Baseline L shoulder flexion and abduction: 3/5   Time 4   Period Weeks   Status New           Plan - Mar 23, 2015 1234    Clinical Impression Statement Pt is slowly progressing with L shoulder AROM and strength. Pt reports slight pain and pulling with L shoulder PROM. Pt continues to demonstrate upper trap compensation with shoulder flex and abd with slight shoulder pain. Pt requires verbal and tactile cueing to keep elbow straight and keep eyes forward with red theraband exercises, primarily shoulder flexion. Pt reports slight L shoulder pain with shoulder abd with red tb. Pt reports no complaints of pain with lifting 10# box off ground and onto shelf while demonstrating good form and eye forward. L shoulder AROM in supine: Flex: 105 deg. Abd: 85 deg. ER: 24 deg. IR: 69 deg. L shoulder AROM in seated: Flex: 95 deg. Abd: 84 deg. L shoulder MMT: Flex: 3/5. Abd: 3/5. L elbow MMT: Flex: 5/5. Ext: 4/5. Quick Dash: 25%.   Pt will benefit from skilled therapeutic intervention in order to improve on the following deficits Hypomobility;Decreased strength;Impaired UE functional use;Pain;Decreased mobility;Decreased range of motion;Improper body mechanics;Decreased endurance;Decreased activity tolerance;Postural dysfunction   Rehab Potential Good   PT Frequency 2x / week   PT Duration 6 weeks   PT Treatment/Interventions ADLs/Self Care Home Management;Passive range of  motion;Patient/family education;Cryotherapy;Electrical Stimulation;Iontophoresis '4mg'$ /ml Dexamethasone;Therapeutic activities;Manual techniques;Therapeutic exercise;Moist Heat   PT Next Visit Plan overhead reaching with cones/functional home tasks/strenghtening/ RECERT (reassess goals and progress made)    PT Home Exercise Plan continue current plan   Consulted and Agree with Plan of Care Patient          G-Codes - 23-Mar-2015 1426    Functional Assessment Tool Used QuickDASH/ clinical judgement/ pain/ ROM   Functional Limitation Carrying, moving and handling objects  Carrying, Moving and Handling Objects Current Status [phone]) At least 20 percent but less than 40 percent impaired, limited or restricted   Carrying, Moving and Handling Objects Goal Status ([id number]) At least 1 percent but less than 20 percent impaired, limited or restricted      Problem List Patient Active Problem List   Diagnosis Date Noted  . Rectal bleeding 06/10/2014  . AP (abdominal pain) 12/29/2013  . Ventral hernia without obstruction or gangrene 12/18/2013  . Crohn's disease ([place]) 12/18/2013   Pura Spice, PT, DPT # [phone]   03/18/2015, 2:28 PM  [facility] [address] [place], [place], [address] Phone: [phone]   Fax:  [phone]  Name: Joanna Ortiz MRN: [id number] Date of Birth: 18-Sep-1945

## 2015-03-21 ENCOUNTER — Encounter: Payer: Federal, State, Local not specified - PPO | Admitting: Physical Therapy

## 2015-03-22 ENCOUNTER — Ambulatory Visit: Payer: Federal, State, Local not specified - PPO | Admitting: Physical Therapy

## 2015-03-22 ENCOUNTER — Encounter: Payer: Self-pay | Admitting: Physical Therapy

## 2015-03-22 DIAGNOSIS — M25612 Stiffness of left shoulder, not elsewhere classified: Secondary | ICD-10-CM

## 2015-03-22 DIAGNOSIS — M6281 Muscle weakness (generalized): Secondary | ICD-10-CM | POA: Diagnosis not present

## 2015-03-22 DIAGNOSIS — M25512 Pain in left shoulder: Secondary | ICD-10-CM

## 2015-03-22 NOTE — Therapy (Signed)
[facility] [address] [place], [place], [address] Phone: [phone]   Fax:  [phone]  Physical Therapy Treatment  Patient Details  Name: Joanna Ortiz MRN: [id number] Date of Birth: Apr 14, 1945 No Data Recorded  Encounter Date: 03/22/2015      PT End of Session - 03/22/15 1401    Visit Number 17   Number of Visits 23   Date for PT Re-Evaluation 04/14/15   Authorization - Visit Number 17   Authorization - Number of Visits 25   PT Start Time 4680   PT Stop Time 3212   PT Time Calculation (min) 32 min   Activity Tolerance Patient tolerated treatment well;No increased pain   Behavior During Therapy [facility] for tasks assessed/performed      Past Medical History  Diagnosis Date  . Crohn disease ([place])   . Thyroid disease     Past Surgical History  Procedure Laterality Date  . Abdominal hysterectomy  1987  . Thyroid surgery  1992  . Small intestine surgery  2012  . Colonoscopy  2013  . Colon surgery  06/06/2011    Resection of terminal ileum and previous ileocolonic anastomosis for recurrent Crohn's disease. 3 focal strictures identified.    There were no vitals filed for this visit.  Visit Diagnosis:  Muscle weakness  Stiffness of left shoulder joint  Left shoulder pain      Subjective Assessment - 03/22/15 1359    Subjective Pt reports L shoulder pain after falling over the weekend. Pt states she had a good MD f/u and no reports issues after fall.  Ecchymosis noted on L lateral upper arm secondary to fall.  Pt. reports MD is happy with progress.    Patient is accompained by: Family member   Limitations Lifting;House hold activities   Patient Stated Goals Increase L shoulder ROM/ strength to improve pain-free mobility.    Currently in Pain? Yes   Pain Score 2    Pain Location Shoulder   Pain Orientation Left   Pain Onset More than a month ago      OBJECTIVE: There ex: UBE warm up 4 mins F/B. Nautilus:  lat pull down 30#/tricep extension 20#/chest press 20#/scapular retraction 20# 10 x 2. Free weights: shoulder flexion/abduction until pain limited ROM 2# 10x 2. Bicep curls 2# 10 x 2. Rhythmic stabilizations in standing.  Pt response to tx for medical necessity: Pt benefits from strengthening to regain functional use of her L UE. Pt benefits from rhythmic stabilization to improve scapular stabilization with overhead reaching. Time limited today secondary to MD appointment regarding pt's ears.           PT Long Term Goals - 03/17/15 1251    PT LONG TERM GOAL #1   Title Pt. I with HEP to increase L shoulder AROM to [facility] as compared to R shoulder in sitting position to progress pain-free mobility.     Time 4   Period Weeks   Status Not Met   PT LONG TERM GOAL #2   Title Pt. able to lift/carry 10# object with proper body mechanics and no c/o L shoulder pain to improve mobility.    Baseline achieved 1/26   Time 4   Period Weeks   Status Achieved   PT LONG TERM GOAL #3   Title Pt. will decrease QuickDASH to <20% to improve UE functional mobility/ sleeping.    Baseline 38.6% on 12/29. 25% on 1/26.   Time 4  Period Weeks   Status New   PT LONG TERM GOAL #4   Title Pt. able to complete household cleaning/ dressing with no L shoulder pain or limitations.     Baseline improving but not pain free   Time 4   Period Weeks   Status Partially Met   PT LONG TERM GOAL #5   Title Pt will increase L shoulder flexion AROM to >110 deg with minimal upper trap compensation in order wash hair.   Baseline L shoulder flexion: 105 deg 03/17/15   Time 4   Period Weeks   Status New   Additional Long Term Goals   Additional Long Term Goals Yes   PT LONG TERM GOAL #6   Title Pt will increase L shoulder strength MMT to 4/5 in order to return to prior level of function.   Baseline L shoulder flexion and abduction: 3/5   Time 4   Period Weeks   Status New            Plan - 03/22/15 1402     Clinical Impression Statement Pt is progressing with L shoulder strengthening but having difficulty secondary to pain after her fall. Pt is limited with overall strengthening today due to pain. Pt demonstrates L upper trap compensation with all strengthening exercises. Pt continues to progress towards goals and functional use of L UE.    Pt will benefit from skilled therapeutic intervention in order to improve on the following deficits Hypomobility;Decreased strength;Impaired UE functional use;Pain;Decreased mobility;Decreased range of motion;Improper body mechanics;Decreased endurance;Decreased activity tolerance;Postural dysfunction   Rehab Potential Good   PT Frequency 2x / week   PT Duration 6 weeks   PT Treatment/Interventions ADLs/Self Care Home Management;Passive range of motion;Patient/family education;Cryotherapy;Electrical Stimulation;Iontophoresis '4mg'$ /ml Dexamethasone;Therapeutic activities;Manual techniques;Therapeutic exercise;Moist Heat   PT Next Visit Plan overhead reaching with cones/functional home tasks/strenghtening/ drop frequency to once a week in 2 weeks.    PT Home Exercise Plan continue current plan   Consulted and Agree with Plan of Care Patient        Problem List Patient Active Problem List   Diagnosis Date Noted  . Rectal bleeding 06/10/2014  . AP (abdominal pain) 12/29/2013  . Ventral hernia without obstruction or gangrene 12/18/2013  . Crohn's disease ([place]) 12/18/2013   Pura Spice, PT, DPT # [id number]   03/22/2015, 2:13 PM  [place] [facility] [address] [place], [place], [address] Phone: [phone]   Fax:  [phone]  Name: Joanna Ortiz MRN: [id number] Date of Birth: October 01, 1945

## 2015-03-23 ENCOUNTER — Encounter: Payer: Medicare Other | Admitting: Physical Therapy

## 2015-03-24 ENCOUNTER — Ambulatory Visit: Payer: Federal, State, Local not specified - PPO | Attending: Orthopedic Surgery | Admitting: Physical Therapy

## 2015-03-24 DIAGNOSIS — M25612 Stiffness of left shoulder, not elsewhere classified: Secondary | ICD-10-CM | POA: Insufficient documentation

## 2015-03-24 DIAGNOSIS — M25512 Pain in left shoulder: Secondary | ICD-10-CM | POA: Insufficient documentation

## 2015-03-24 DIAGNOSIS — M6281 Muscle weakness (generalized): Secondary | ICD-10-CM | POA: Insufficient documentation

## 2015-03-29 ENCOUNTER — Encounter: Payer: Medicare Other | Admitting: Physical Therapy

## 2015-03-29 ENCOUNTER — Ambulatory Visit: Payer: Federal, State, Local not specified - PPO | Admitting: Physical Therapy

## 2015-03-29 DIAGNOSIS — M25612 Stiffness of left shoulder, not elsewhere classified: Secondary | ICD-10-CM | POA: Diagnosis present

## 2015-03-29 DIAGNOSIS — M25512 Pain in left shoulder: Secondary | ICD-10-CM

## 2015-03-29 DIAGNOSIS — M6281 Muscle weakness (generalized): Secondary | ICD-10-CM

## 2015-03-30 NOTE — Therapy (Signed)
[place] [facility] [address] [place], [place], [address] Phone: [phone]   Fax:  [phone]  Physical Therapy Treatment  Patient Details  Name: Joanna Ortiz MRN: [id number] Date of Birth: [birth year] No Data Recorded  Encounter Date: 03/29/2015      PT End of Session - 03/30/15 0752    Visit Number 18   Number of Visits 23   Date for PT Re-Evaluation 04/14/15   Authorization - Visit Number 18   Authorization - Number of Visits 25   PT Start Time 1109   PT Stop Time 6237   PT Time Calculation (min) 46 min   Activity Tolerance Patient tolerated treatment well;Patient limited by pain;Patient limited by fatigue   Behavior During Therapy [facility] for tasks assessed/performed      Past Medical History  Diagnosis Date  . Crohn disease ([place])   . Thyroid disease     Past Surgical History  Procedure Laterality Date  . Abdominal hysterectomy  1987  . Thyroid surgery  1992  . Small intestine surgery  2012  . Colonoscopy  2013  . Colon surgery  06/06/2011    Resection of terminal ileum and previous ileocolonic anastomosis for recurrent Crohn's disease. 3 focal strictures identified.    There were no vitals filed for this visit.  Visit Diagnosis:  Muscle weakness  Stiffness of left shoulder joint  Left shoulder pain      Subjective Assessment - 03/30/15 0751    Subjective Pt. reports no new complaints.  Pt. states she is still dealing with chronic ear infection and being managed by ENT.  Pt. states she is able to wash/ manage hair better with L shoulder.     Patient is accompained by: Family member   Limitations Lifting;House hold activities   Patient Stated Goals Increase L shoulder ROM/ strength to improve pain-free mobility.    Currently in Pain? Yes   Pain Score 3    Pain Location Shoulder   Pain Orientation Left   Pain Descriptors / Indicators Aching   Pain Type Surgical pain      OBJECTIVE:  There ex: UBE warm  up 4 mins F/B (no charge).  Nautilus: seated lat pull down 40#/tricep extension 30#/chest press 20#/scapular retraction 30#/ B shoulder adduction 30#/ bicep curls 20# 10 x 2.  Free weights: shoulder flexion/abduction until pain limited ROM 2# 10 x 2.  Rhythmic stabilizations in supine to L shoulder 3 x 30 sec.  Standing doorway push-ups 10x (difficulty with L shoulder flexion >90 deg. Due to catching pain).  Manual tx.: supine/ seated L shoulder AA/PROM 10x all planes (pain limited).  STM to L deltoid/ biceps region.  Pt response to tx for medical necessity: Pt benefits from strengthening to regain functional use of her L UE. Pt benefits from rhythmic stabilization to improve scapular stabilization with overhead reaching.         PT Long Term Goals - 03/17/15 1251    PT LONG TERM GOAL #1   Title Pt. I with HEP to increase L shoulder AROM to [facility] as compared to R shoulder in sitting position to progress pain-free mobility.     Time 4   Period Weeks   Status Not Met   PT LONG TERM GOAL #2   Title Pt. able to lift/carry 10# object with proper body mechanics and no c/o L shoulder pain to improve mobility.    Baseline achieved 1/26   Time 4   Period Weeks  Status Achieved   PT LONG TERM GOAL #3   Title Pt. will decrease QuickDASH to <20% to improve UE functional mobility/ sleeping.    Baseline 38.6% on 12/29. 25% on 1/26.   Time 4   Period Weeks   Status New   PT LONG TERM GOAL #4   Title Pt. able to complete household cleaning/ dressing with no L shoulder pain or limitations.     Baseline improving but not pain free   Time 4   Period Weeks   Status Partially Met   PT LONG TERM GOAL #5   Title Pt will increase L shoulder flexion AROM to >110 deg with minimal upper trap compensation in order wash hair.   Baseline L shoulder flexion: 105 deg 03/17/15   Time 4   Period Weeks   Status New   Additional Long Term Goals   Additional Long Term Goals Yes   PT LONG TERM GOAL #6    Title Pt will increase L shoulder strength MMT to 4/5 in order to return to prior level of function.   Baseline L shoulder flexion and abduction: 3/5   Time 4   Period Weeks   Status New            Plan - 03/30/15 1048    Clinical Impression Statement Pt. L shoulder AROM remains limited by joint stiffness/ pain with AAROM in supine/ seated position.  Progressing well with resisted ex. program on Nautilus with minimal cuing for proper posture/ decrease UT and biceps compensatory movement patterns.  Difficulty with standing door push-ups secondary to sharp/catching L shoulder pain with >90 deg. flexion.     Pt will benefit from skilled therapeutic intervention in order to improve on the following deficits Hypomobility;Decreased strength;Impaired UE functional use;Pain;Decreased mobility;Decreased range of motion;Improper body mechanics;Decreased endurance;Decreased activity tolerance;Postural dysfunction   Rehab Potential Good   PT Frequency 2x / week   PT Duration 6 weeks   PT Treatment/Interventions ADLs/Self Care Home Management;Passive range of motion;Patient/family education;Cryotherapy;Electrical Stimulation;Iontophoresis '4mg'$ /ml Dexamethasone;Therapeutic activities;Manual techniques;Therapeutic exercise;Moist Heat   PT Next Visit Plan overhead reaching with cones/functional home tasks/strenghtening/ drop frequency after next tx.    PT Home Exercise Plan continue current plan   Consulted and Agree with Plan of Care Patient        Problem List Patient Active Problem List   Diagnosis Date Noted  . Rectal bleeding 06/10/2014  . AP (abdominal pain) 12/29/2013  . Ventral hernia without obstruction or gangrene 12/18/2013  . Crohn's disease ([place]) 12/18/2013   Pura Spice, PT, DPT # [id number]   03/30/2015, 11:29 AM  [place] [address] [place], [place], [address] Phone: [phone]   Fax:  [phone]  Name: Joanna Ortiz MRN: [id number] Date of Birth: 1945/07/28

## 2015-03-31 ENCOUNTER — Ambulatory Visit: Payer: Federal, State, Local not specified - PPO | Admitting: Physical Therapy

## 2015-03-31 ENCOUNTER — Encounter: Payer: Self-pay | Admitting: Physical Therapy

## 2015-03-31 ENCOUNTER — Encounter: Payer: Medicare Other | Admitting: Physical Therapy

## 2015-03-31 DIAGNOSIS — M25612 Stiffness of left shoulder, not elsewhere classified: Secondary | ICD-10-CM

## 2015-03-31 DIAGNOSIS — M25512 Pain in left shoulder: Secondary | ICD-10-CM

## 2015-03-31 DIAGNOSIS — M6281 Muscle weakness (generalized): Secondary | ICD-10-CM

## 2015-03-31 NOTE — Therapy (Signed)
[place] [facility] [address] [place], [place], [address] Phone: [phone]   Fax:  [phone]  Physical Therapy Treatment  Patient Details  Name: Joanna Ortiz MRN: [id number] Date of Birth: 08/22/1945 No Data Recorded  Encounter Date: 03/31/2015      PT End of Session - 03/31/15 1325    Visit Number 19   Number of Visits 23   Date for PT Re-Evaluation 04/14/15   Authorization - Visit Number 19   Authorization - Number of Visits 25   PT Start Time 1030   PT Stop Time 1132   PT Time Calculation (min) 62 min   Activity Tolerance Patient tolerated treatment well;Patient limited by pain   Behavior During Therapy [facility] for tasks assessed/performed      Past Medical History  Diagnosis Date  . Crohn disease ([facility])   . Thyroid disease     Past Surgical History  Procedure Laterality Date  . Abdominal hysterectomy  1987  . Thyroid surgery  1992  . Small intestine surgery  2012  . Colonoscopy  2013  . Colon surgery  06/06/2011    Resection of terminal ileum and previous ileocolonic anastomosis for recurrent Crohn's disease. 3 focal strictures identified.    There were no vitals filed for this visit.  Visit Diagnosis:  Stiffness of left shoulder joint  Muscle weakness  Left shoulder pain  Muscle weakness (generalized)      Subjective Assessment - 03/31/15 1323    Subjective Pt reports 0/10 pain today. Pt reports she still has increased R shoulder stiffness which inhibits her from washing her hair and reading in hgh cabinets. Pt reports she does feel like she is improving from therapy and is compliant with her HEP.   Patient is accompained by: Family member   Limitations Lifting;House hold activities   Patient Stated Goals Increase L shoulder ROM/ strength to improve pain-free mobility.    Currently in Pain? No/denies   Pain Onset More than a month ago      Objectives: UBE 2 fwd/2 bwd (warm-up: no charge).Seated R  shoulder flex AROM 2 # 3 x 10. Seated R shoulder abd AROM 1 # 3 x 10. Seated shoulder PNF patterns (D1/D2) AROM 2 x 10 each. Performed bicep curls on Nautilus 2 x 10 - 20#, tricep extensions 2 x 10 - 20#, lat pull downs 2 x 10 - 30 #, standing rows 1 set of 10 reps w/ 20#, 1 set of 10 reps w/ 30 #. Pt required verbal and tactile cues for keeping elbow by the side for tricep extension exercise. Cone placing on higher shelf 2 x 10. Pt response to treatment for medical necessity. Pt will benefit from PT to increase ROM of R shoulder in order to perform ADLs more effectively and increase strength in order to lift groceries and put them in cabinets as well as putting objects in high cabinets and closets.          PT Long Term Goals - 03/17/15 1251    PT LONG TERM GOAL #1   Title Pt. I with HEP to increase L shoulder AROM to WFL as compared to R shoulder in sitting position to progress pain-free mobility.     Time 4   Period Weeks   Status Not Met   PT LONG TERM GOAL #2   Title Pt. able to lift/carry 10# object with proper body mechanics and no c/o L shoulder pain to improve mobility.      Baseline achieved 1/26   Time 4   Period Weeks   Status Achieved   PT LONG TERM GOAL #3   Title Pt. will decrease QuickDASH to <20% to improve UE functional mobility/ sleeping.    Baseline 38.6% on 12/29. 25% on 1/26.   Time 4   Period Weeks   Status New   PT LONG TERM GOAL #4   Title Pt. able to complete household cleaning/ dressing with no L shoulder pain or limitations.     Baseline improving but not pain free   Time 4   Period Weeks   Status Partially Met   PT LONG TERM GOAL #5   Title Pt will increase L shoulder flexion AROM to >110 deg with minimal upper trap compensation in order wash hair.   Baseline L shoulder flexion: 105 deg 03/17/15   Time 4   Period Weeks   Status New   Additional Long Term Goals   Additional Long Term Goals Yes   PT LONG TERM GOAL #6   Title Pt will increase L shoulder  strength MMT to 4/5 in order to return to prior level of function.   Baseline L shoulder flexion and abduction: 3/5   Time 4   Period Weeks   Status New               Plan - 03/31/15 1326    Clinical Impression Statement Pt tolerated treatment well. Pt had difficulties with PNF pattern D2, because she was not able to reach above her head. Pt required verbal and tactile cues for keeping elbows to the sides for tricep extensions as well as cues for bringing the bar to her chest for lat pull downs. Pt demonstrated proper form and posture for bicep curls. Pt demonstrated good form and posture with placing cones on a higher shelf with minimal UT compensation.   Pt will benefit from skilled therapeutic intervention in order to improve on the following deficits Hypomobility;Decreased strength;Impaired UE functional use;Pain;Decreased mobility;Decreased range of motion;Improper body mechanics;Decreased endurance;Decreased activity tolerance;Postural dysfunction   Rehab Potential Good   PT Frequency 2x / week   PT Duration 6 weeks   PT Treatment/Interventions ADLs/Self Care Home Management;Passive range of motion;Patient/family education;Cryotherapy;Electrical Stimulation;Iontophoresis 26m/ml Dexamethasone;Therapeutic activities;Manual techniques;Therapeutic exercise;Moist Heat   PT Next Visit Plan overhead reaching with cones/functional home tasks/strenghtening/ drop frequency after next tx.    PT Home Exercise Plan continue current plan   Consulted and Agree with Plan of Care Patient        Problem List Patient Active Problem List   Diagnosis Date Noted  . Rectal bleeding 06/10/2014  . AP (abdominal pain) 12/29/2013  . Ventral hernia without obstruction or gangrene 12/18/2013  . Crohn's disease ([place] 12/18/2013    SGeorgina Pillion SPT and LDorice Lamas SPT 03/31/2015, 1:37 PM  [place] [facility] [place]  [address]Phone: [phone]  Fax:  [phone] Name: Joanna DESHMUKHMRN: [id number] of Birth: [birth year]

## 2015-04-05 ENCOUNTER — Encounter: Payer: Medicare Other | Admitting: Physical Therapy

## 2015-04-05 ENCOUNTER — Ambulatory Visit: Payer: Federal, State, Local not specified - PPO | Admitting: Physical Therapy

## 2015-04-05 DIAGNOSIS — M6281 Muscle weakness (generalized): Secondary | ICD-10-CM

## 2015-04-05 DIAGNOSIS — M25612 Stiffness of left shoulder, not elsewhere classified: Secondary | ICD-10-CM

## 2015-04-05 DIAGNOSIS — M25512 Pain in left shoulder: Secondary | ICD-10-CM

## 2015-04-05 NOTE — Therapy (Signed)
[place] [facility] [address] [place], [place], [address] Phone: [phone]   Fax:  [phone]  Physical Therapy Treatment  Patient Details  Name: Joanna Ortiz MRN: [id number] Date of Birth: July 22, 1945 No Data Recorded  Encounter Date: 04/05/2015      PT End of Session - 04/05/15 1108    Visit Number 20   Number of Visits 23   Date for PT Re-Evaluation 04/14/15   Authorization - Visit Number 59   Authorization - Number of Visits 25   PT Start Time 8250   PT Stop Time 1109   PT Time Calculation (min) 46 min   Activity Tolerance Patient tolerated treatment well;Patient limited by pain   Behavior During Therapy [facility] for tasks assessed/performed      Past Medical History  Diagnosis Date  . Crohn disease ([place])   . Thyroid disease     Past Surgical History  Procedure Laterality Date  . Abdominal hysterectomy  1987  . Thyroid surgery  1992  . Small intestine surgery  2012  . Colonoscopy  2013  . Colon surgery  06/06/2011    Resection of terminal ileum and previous ileocolonic anastomosis for recurrent Crohn's disease. 3 focal strictures identified.    There were no vitals filed for this visit.  Visit Diagnosis:  Muscle weakness  Stiffness of left shoulder joint  Left shoulder pain  Muscle weakness (generalized)  Shoulder joint stiffness, left      Subjective Assessment - 04/05/15 1107    Subjective Pt reports no pain at rest. Pt reports her ear is starting to bother her agian. Pt has no new complaints upon arrival to PT tx session. Pt requests a stronger therabnad for home.    Patient is accompained by: Family member   Limitations Lifting;House hold activities   Patient Stated Goals Increase L shoulder ROM/ strength to improve pain-free mobility.    Currently in Pain? No/denies      Objectives: UBE 3 fwd/3 bwd (warm-up: no charge). Nautilus: tricep extensions 3 x 10 - 30#, Shoulder flexion 2 x10 with 20#, 8  with 30#, bicep curls with 30# 2 x 8, standing rows  3 x 10 with 30#. Weights from waist height to shoulder height on shelf with 1#, 2#, and 3# weights. Pt progressed to green theraband with all exercises except for shoulder flexion for HEP. Pt demonstrates proper form with exercises in the clinic.    Pt response to treatment for medical necessity. Pt progressing with ADLs and functional UE tasks with PT. Pt benefits from strengthening to increase L shoulder AROM and functional use in a pain free range.           PT Long Term Goals - 03/17/15 1251    PT LONG TERM GOAL #1   Title Pt. I with HEP to increase L shoulder AROM to Joanna Ortiz [facility] as compared to R shoulder in sitting position to progress pain-free mobility.     Time 4   Period Weeks   Status Not Met   PT LONG TERM GOAL #2   Title Pt. able to lift/carry 10# object with proper body mechanics and no c/o L shoulder pain to improve mobility.    Baseline achieved 1/26   Time 4   Period Weeks   Status Achieved   PT LONG TERM GOAL #3   Title Pt. will decrease QuickDASH to <20% to improve UE functional mobility/ sleeping.    Baseline 38.6% on 12/29. 25% on 1/26.  Time 4   Period Weeks   Status New   PT LONG TERM GOAL #4   Title Pt. able to complete household cleaning/ dressing with no L shoulder pain or limitations.     Baseline improving but not pain free   Time 4   Period Weeks   Status Partially Met   PT LONG TERM GOAL #5   Title Pt will increase L shoulder flexion AROM to >110 deg with minimal upper trap compensation in order wash hair.   Baseline L shoulder flexion: 105 deg 03/17/15   Time 4   Period Weeks   Status New   Additional Long Term Goals   Additional Long Term Goals Yes   PT LONG TERM GOAL #6   Title Pt will increase L shoulder strength MMT to 4/5 in order to return to prior level of function.   Baseline L shoulder flexion and abduction: 3/5   Time 4   Period Weeks   Status New            Plan - 04/05/15 1109     Clinical Impression Statement Pt demonstrates an increase in muscle endurance as noted by the increase to 30# on the Nautilus and 3# with shoulder to waist height lifting. Pt reports an increase in L shoulder pain with overhead reaching during today's session. Pt demonstrates an incresae in edurance with fewer rest breaks needed as compared to previous sessions. Pt requires verbal cues for proper form on tricep extension and shoulder flexion.    Pt will benefit from skilled therapeutic intervention in order to improve on the following deficits Hypomobility;Decreased strength;Impaired UE functional use;Pain;Decreased mobility;Decreased range of motion;Improper body mechanics;Decreased endurance;Decreased activity tolerance;Postural dysfunction   Rehab Potential Good   PT Frequency 2x / week   PT Duration 6 weeks   PT Treatment/Interventions ADLs/Self Care Home Management;Passive range of motion;Patient/family education;Cryotherapy;Electrical Stimulation;Iontophoresis 61m/ml Dexamethasone;Therapeutic activities;Manual techniques;Therapeutic exercise;Moist Heat   PT Next Visit Plan overhead reaching with cones/functional home tasks/strenghtening/ drop frequency after next tx.    PT Home Exercise Plan continue current plan   Consulted and Agree with Plan of Care Patient        Problem List Patient Active Problem List   Diagnosis Date Noted  . Rectal bleeding 06/10/2014  . AP (abdominal pain) 12/29/2013  . Ventral hernia without obstruction or gangrene 12/18/2013  . [place]'s disease ([place] 12/18/2013   MPura Spice PT, DPT # [id number]  04/06/2015, 5:00 PM  [place] [facility]MA[place] [address]Phone: [phone]  Fax:  [phone] Name: Joanna MCCAYMRN: [id number] of Birth: 1January 23, 1947

## 2015-04-06 ENCOUNTER — Encounter: Payer: Self-pay | Admitting: Physical Therapy

## 2015-04-07 ENCOUNTER — Encounter: Payer: Medicare Other | Admitting: Physical Therapy

## 2015-04-12 ENCOUNTER — Encounter: Payer: Self-pay | Admitting: Physical Therapy

## 2015-04-12 ENCOUNTER — Encounter: Payer: Medicare Other | Admitting: Physical Therapy

## 2015-04-12 ENCOUNTER — Ambulatory Visit: Payer: Federal, State, Local not specified - PPO | Admitting: Physical Therapy

## 2015-04-12 DIAGNOSIS — M25612 Stiffness of left shoulder, not elsewhere classified: Secondary | ICD-10-CM

## 2015-04-12 DIAGNOSIS — M6281 Muscle weakness (generalized): Secondary | ICD-10-CM

## 2015-04-12 DIAGNOSIS — M25512 Pain in left shoulder: Secondary | ICD-10-CM

## 2015-04-12 NOTE — Therapy (Signed)
[facility] [address] [place], [place], [address] Phone: [phone]   Fax:  [phone]  Physical Therapy Treatment  Patient Details  Name: Joanna Ortiz MRN: [id number] Date of Birth: [birth year] No Data Recorded  Encounter Date: 04/12/2015      PT End of Session - 04/12/15 1751    Visit Number 21   Number of Visits 23   Date for PT Re-Evaluation 04/14/15   Authorization - Visit Number 21   Authorization - Number of Visits 25   PT Start Time 2620   PT Stop Time 1121   PT Time Calculation (min) 49 min   Activity Tolerance Patient tolerated treatment well;Patient limited by pain   Behavior During Therapy [facility] for tasks assessed/performed      Past Medical History  Diagnosis Date  . Crohn disease ([place])   . Thyroid disease     Past Surgical History  Procedure Laterality Date  . Abdominal hysterectomy  1987  . Thyroid surgery  1992  . Small intestine surgery  2012  . Colonoscopy  2013  . Colon surgery  06/06/2011    Resection of terminal ileum and previous ileocolonic anastomosis for recurrent Crohn's disease. 3 focal strictures identified.    There were no vitals filed for this visit.  Visit Diagnosis:  Muscle weakness  Stiffness of left shoulder joint  Left shoulder pain  Muscle weakness (generalized)  Shoulder joint stiffness, left      Subjective Assessment - 04/12/15 1059    Subjective Pt reports 1/10 sore pain in L shoulder. Pt states she has not been able to sleep well due to pain in ear and has an appointment with ear doctor tomorrow.   Patient is accompained by: Family member   Limitations Lifting;House hold activities   Patient Stated Goals Increase L shoulder ROM/ strength to improve pain-free mobility.    Currently in Pain? Yes   Pain Score 1    Pain Location Shoulder   Pain Orientation Left   Pain Descriptors / Indicators Sore   Pain Type Surgical pain   Pain Onset More than a month  ago      Objective: UBE: 2 mins forwards and 2 mins backwards (warm-up/no charge). Ther ex: Overhead reaching across body on shelf with 1# and 2# weight x 5. Shoulder flexion wall slides with towel 2 x 5 with pt reporting pain at end range and muscle fatigue. Bicep curls with 3# weight 2 x 10. Serratus punch in standing R shoulder with 2# weight x 20 and L shoulder with no weight x 20. D1/D2 pattern with 1# weight 2 x 10 with verbal and tactile cueing to prevent compensation. Rebounder x 10 with yellow ball x 15 with 1lb ball with pt demonstrating good stability and hand-eye coordination.   L shoulder flex and abd: 3+/5 L shoulder AROM in standing (functional): 89 deg. Qdash: 29%  Pt response to treatment for medical necessity. Pt progressing with ADLs and functional UE tasks with PT. Pt benefits from strengthening to increase L shoulder AROM and functional use in a pain free range in order to complete household tasks.        PT Education - 04/12/15 1743    Education provided Yes   Education Details Pt was given upper trap stretch, levator stretch, towel slides (flexion and abduction), serratus punch, and ER in sidelying.   Person(s) Educated Patient   Methods Explanation;Demonstration;Handout   Comprehension Verbalized understanding;Returned demonstration  PT Long Term Goals - 04/12/15 1112    PT LONG TERM GOAL #1   Title Pt. I with HEP to increase L shoulder AROM to [facility] as compared to R shoulder in sitting position to progress pain-free mobility.     Time 4   Period Weeks   Status Not Met   PT LONG TERM GOAL #2   Title Pt. able to lift/carry 10# object with proper body mechanics and no c/o L shoulder pain to improve mobility.    Baseline achieved 1/26   Time 4   Period Weeks   Status Achieved   PT LONG TERM GOAL #3   Title Pt. will decrease QuickDASH to <20% to improve UE functional mobility/ sleeping.    Baseline 38.6% on 12/29. 25% on 1/26. 29% on 2/21    Time 4   Period Weeks   Status On-going   PT LONG TERM GOAL #4   Title Pt. able to complete household cleaning/ dressing with no L shoulder pain or limitations.     Baseline met 2/21   Time 4   Period Weeks   Status Achieved   PT LONG TERM GOAL #5   Title Pt will increase L shoulder flexion AROM to >110 deg with minimal upper trap compensation in order wash hair.   Baseline L shoulder flexion: 105 deg 03/17/15. Functional L shoulder flexion in standing: 89 deg.   Time 4   Period Weeks   Status On-going   PT LONG TERM GOAL #6   Title Pt will increase L shoulder strength MMT to 4/5 in order to return to prior level of function.   Baseline L shoulder flexion and abduction: 3/5 on 1/26. L shoulder flex and abd: 3+/5 on 2/21.   Time 4   Period Weeks   Status On-going            Plan - 04/12/15 1757    Clinical Impression Statement Pt demonstrates fatigue with shoulder flexion towel wall slides. Pt was only able to complete 5 reps before reporting fatigue, completed second set of 5 after going through other exercises. Pt had difficulty with D1/D2 motion due to limited motion but reported no increase in pain if staying within available range. Pt requires verbal cueing to prevent standing on toes with overhead reaching on shelf but shows good endurance with this exercise. Pt requires cueing to keep elbow straight with serratus punch. L shoulder MMT: Flex: 3+/5. Abd: 3+/5. AROM L shoulder flexion in standing: 89 deg. Qdash: 29%.   Pt will benefit from skilled therapeutic intervention in order to improve on the following deficits Hypomobility;Decreased strength;Impaired UE functional use;Pain;Decreased mobility;Decreased range of motion;Improper body mechanics;Decreased endurance;Decreased activity tolerance;Postural dysfunction   Rehab Potential Good   PT Frequency 2x / week   PT Duration 6 weeks   PT Treatment/Interventions ADLs/Self Care Home Management;Passive range of  motion;Patient/family education;Cryotherapy;Electrical Stimulation;Iontophoresis '4mg'$ /ml Dexamethasone;Therapeutic activities;Manual techniques;Therapeutic exercise;Moist Heat   PT Next Visit Plan overhead reaching with cones/functional home tasks/strenghtening/ drop frequency after next tx.    PT Home Exercise Plan continue current plan   Consulted and Agree with Plan of Care Patient        Problem List Patient Active Problem List   Diagnosis Date Noted  . Rectal bleeding 06/10/2014  . AP (abdominal pain) 12/29/2013  . Ventral hernia without obstruction or gangrene 12/18/2013  . Crohn's disease ([place]) 12/18/2013   Pura Spice, PT, DPT # [id number]   04/13/2015, 2:16 PM  [facility] [address] [place], [place], [address] Phone: [phone]   Fax:  [phone]  Name: Joanna Ortiz MRN: [id number] Date of Birth: 08/01/1945

## 2015-04-14 ENCOUNTER — Encounter: Payer: Medicare Other | Admitting: Physical Therapy

## 2015-04-14 DIAGNOSIS — H18529 Epithelial (juvenile) corneal dystrophy, unspecified eye: Secondary | ICD-10-CM | POA: Insufficient documentation

## 2015-04-14 DIAGNOSIS — H0259 Other disorders affecting eyelid function: Secondary | ICD-10-CM | POA: Insufficient documentation

## 2015-04-14 DIAGNOSIS — H269 Unspecified cataract: Secondary | ICD-10-CM | POA: Insufficient documentation

## 2015-04-14 DIAGNOSIS — H04123 Dry eye syndrome of bilateral lacrimal glands: Secondary | ICD-10-CM | POA: Insufficient documentation

## 2015-04-19 ENCOUNTER — Ambulatory Visit: Payer: Federal, State, Local not specified - PPO | Admitting: Physical Therapy

## 2015-04-19 DIAGNOSIS — M6281 Muscle weakness (generalized): Secondary | ICD-10-CM

## 2015-04-19 DIAGNOSIS — M25512 Pain in left shoulder: Secondary | ICD-10-CM

## 2015-04-19 DIAGNOSIS — M25612 Stiffness of left shoulder, not elsewhere classified: Secondary | ICD-10-CM

## 2015-04-19 NOTE — Therapy (Signed)
[facility] [address] [place], [place], [address] Phone: [phone]   Fax:  [phone]  Physical Therapy Treatment  Patient Details  Name: Joanna Ortiz MRN: [id number] Date of Birth: 10/11/1945 No Data Recorded  Encounter Date: 04/19/2015      PT End of Session - 04/19/15 1615    Visit Number 22   Number of Visits 26   Date for PT Re-Evaluation 05/17/15   Authorization - Visit Number 22   Authorization - Number of Visits 32   PT Start Time 1022   PT Stop Time 1121   PT Time Calculation (min) 59 min   Activity Tolerance Patient tolerated treatment well;Patient limited by pain   Behavior During Therapy [facility] for tasks assessed/performed      Past Medical History  Diagnosis Date  . Crohn disease ([facility])   . Thyroid disease     Past Surgical History  Procedure Laterality Date  . Abdominal hysterectomy  1987  . Thyroid surgery  1992  . Small intestine surgery  2012  . Colonoscopy  2013  . Colon surgery  06/06/2011    Resection of terminal ileum and previous ileocolonic anastomosis for recurrent Crohn's disease. 3 focal strictures identified.    There were no vitals filed for this visit.  Visit Diagnosis:  Muscle weakness  Stiffness of left shoulder joint  Left shoulder pain  Muscle weakness (generalized)  Shoulder joint stiffness, left      Subjective Assessment - 04/19/15 1613    Subjective Pt. reports minimal L shoulder pain currently at rest but states her ear infection remains.  Pt. went to ER last Thursday after having a an episode of anxiety/ claustrophobia in car while at grocery store parking lot.    Limitations Lifting;House hold activities   Patient Stated Goals Increase L shoulder ROM/ strength to improve pain-free mobility.    Currently in Pain? Yes   Pain Score 0-No pain   Pain Location Shoulder   Pain Orientation Left   Pain Descriptors / Indicators Sore   Pain Type Surgical pain       Objective: UBE: 3 mins forwards/backwards (warm-up/no charge).  Ther ex: Seated 4# L radial/ulnar deviation/ wrist flexion/ ext./ bicep curls 20x (fatigue).  Seated L shoulder AROM with assist as needed with/without wt. Wand. Serratus punch in standing R shoulder with 2# weight x 20 and L shoulder with no weight x 20. Nautilus: standing lat. Pull downs 30# 20x/ scap. Retraction 30# 30x/ tricep ext. 30# 20x (fatigue).  Reviewed HEP (emphasized importance of daily stretching and 3-4x/week strengthening ex.).  Manual tx.: Seated STM to L UT/scapular region/ deltoid 8 min.  AA/PROM to L shoulder (all planes) in sitting secondary to ear issues/ dizziness. (unable to lie supine).     Pt response to treatment for medical necessity. Pt progressing with ADLs and functional UE tasks with PT. Pt benefits from strengthening to increase L shoulder AROM and functional use in a pain free range in order to complete household tasks.         PT Long Term Goals - 04/19/15 1621    PT LONG TERM GOAL #1   Title Pt. I with HEP to increase L shoulder AROM to [facility] as compared to R shoulder in sitting position to progress pain-free mobility.     Time 4   Period Weeks   Status Not Met   PT LONG TERM GOAL #2   Title Pt. able to lift/carry 10# object  with proper body mechanics and no c/o L shoulder pain to improve mobility.    Baseline achieved 1/26   Time 4   Period Weeks   Status Achieved   PT LONG TERM GOAL #3   Title Pt. will decrease QuickDASH to <20% to improve UE functional mobility/ sleeping.    Baseline 38.6% on 12/29. 25% on 1/26. 29% on 2/21   Time 4   Period Weeks   Status Not Met   PT LONG TERM GOAL #4   Title Pt. able to complete household cleaning/ dressing with no L shoulder pain or limitations.     Baseline met 2/21   Time 4   Period Weeks   Status Achieved   PT LONG TERM GOAL #5   Title Pt will increase L shoulder flexion AROM to >110 deg with minimal upper trap compensation in order wash  hair.   Baseline L shoulder flexion: 105 deg 03/17/15. Functional L shoulder flexion in standing: 92 deg.   Time 4   Period Weeks   Status Not Met   PT LONG TERM GOAL #6   Title Pt will increase L shoulder strength MMT to 4/5 in order to return to prior level of function.   Baseline L shoulder flexion and abduction: 3/5 on 1/26. L shoulder flex and abd: 3+/5 on 2/21.   Time 4   Period Weeks   Status Partially Met           Plan - 2015/04/23 1617    Clinical Impression Statement Pt. continues to make slow but consistent progress with L shoulder ROM/ strengthening.  L shoulder flexion in seated/standing posture 92 deg./ abd. 88 deg. prior to lateral lean/ compensatory movement patterns.  Pt. limited by L shoulder joint stiffness and scapular/ shoulder muscle weakness.  L shoulder muscle strength grossly 3+/5 MMT with scapular weakness noted in seated/ standing posture.  Pt. continues to require frequent cuing to maintain L elbow extension with overhead reaching/ functional tasks.  Pt. will benefit from skilled PT services 1x/week x4 weeks to increase strengthening with HEP focus.     Pt will benefit from skilled therapeutic intervention in order to improve on the following deficits Hypomobility;Decreased strength;Impaired UE functional use;Pain;Decreased mobility;Decreased range of motion;Improper body mechanics;Decreased endurance;Decreased activity tolerance;Postural dysfunction   Rehab Potential Good   PT Frequency 1x / week   PT Duration 4 weeks   PT Treatment/Interventions ADLs/Self Care Home Management;Passive range of motion;Patient/family education;Cryotherapy;Electrical Stimulation;Iontophoresis '4mg'$ /ml Dexamethasone;Therapeutic activities;Manual techniques;Therapeutic exercise;Moist Heat   PT Next Visit Plan overhead reaching with cones/functional home tasks/strenghtening focus.     PT Home Exercise Plan continue current plan   Consulted and Agree with Plan of Care Patient           G-Codes - 2015/04/23 1622    Functional Assessment Tool Used QuickDASH/ clinical judgement/ pain/ ROM   Functional Limitation Carrying, moving and handling objects   Carrying, Moving and Handling Objects Current Status [phone]) At least 20 percent but less than 40 percent impaired, limited or restricted   Carrying, Moving and Handling Objects Goal Status ([id number]) At least 1 percent but less than 20 percent impaired, limited or restricted      Problem List Patient Active Problem List   Diagnosis Date Noted  . Rectal bleeding 06/10/2014  . AP (abdominal pain) 12/29/2013  . Ventral hernia without obstruction or gangrene 12/18/2013  . Crohn's disease ([place]) 12/18/2013   Pura Spice, PT, DPT # [id number]   04-23-2015, 4:24 PM  [facility] [address] [place], [place], [address] Phone: [phone]   Fax:  [phone]  Name: Joanna Ortiz MRN: [id number] Date of Birth: 1945-03-15

## 2015-04-21 ENCOUNTER — Encounter: Payer: Medicare Other | Admitting: Physical Therapy

## 2015-04-26 ENCOUNTER — Encounter: Payer: Medicare Other | Admitting: Physical Therapy

## 2015-04-27 ENCOUNTER — Encounter: Payer: Self-pay | Admitting: Physical Therapy

## 2015-04-27 ENCOUNTER — Ambulatory Visit: Payer: Federal, State, Local not specified - PPO | Attending: Orthopedic Surgery | Admitting: Physical Therapy

## 2015-04-27 DIAGNOSIS — M25512 Pain in left shoulder: Secondary | ICD-10-CM

## 2015-04-27 DIAGNOSIS — M25612 Stiffness of left shoulder, not elsewhere classified: Secondary | ICD-10-CM | POA: Diagnosis present

## 2015-04-27 DIAGNOSIS — M6281 Muscle weakness (generalized): Secondary | ICD-10-CM

## 2015-04-27 NOTE — Therapy (Signed)
[place] [facility] [address] [place], [place], [address] Phone: [phone]   Fax:  [phone]  Physical Therapy Treatment  Patient Details  Name: Joanna Ortiz MRN: [id number] Date of Birth: 13-Sep-1945 No Data Recorded  Encounter Date: 04/27/2015      PT End of Session - 04/27/15 0830    Visit Number 23   Number of Visits 26   Date for PT Re-Evaluation 05/17/15   Authorization - Visit Number 23   Authorization - Number of Visits 32   PT Start Time 0812   PT Stop Time 0923   PT Time Calculation (min) 71 min   Activity Tolerance Patient tolerated treatment well;Patient limited by fatigue;Patient limited by pain   Behavior During Therapy [facility] for tasks assessed/performed      Past Medical History  Diagnosis Date  . Crohn disease ([place])   . Thyroid disease     Past Surgical History  Procedure Laterality Date  . Abdominal hysterectomy  1987  . Thyroid surgery  1992  . Small intestine surgery  2012  . Colonoscopy  2013  . Colon surgery  06/06/2011    Resection of terminal ileum and previous ileocolonic anastomosis for recurrent Crohn's disease. 3 focal strictures identified.    There were no vitals filed for this visit.  Visit Diagnosis:  Muscle weakness  Stiffness of left shoulder joint  Left shoulder pain      Subjective Assessment - 04/27/15 0814    Subjective Pt. reports 2/10 L shoulder pain and states "it just aches".  Moderate c/o tightness in L shoulder with tenderness reported during palpation.  Pt. reports it feels tight under shoulder blade.  Pt. states she slept well last night.      Limitations Standing;Writing;House hold activities   Patient Stated Goals Increase L shoulder ROM/ strength/ pain-free mobility   Currently in Pain? Yes   Pain Score 2    Pain Location Shoulder   Pain Orientation Left   Pain Type Chronic pain       Objective: UBE: 3 mins forwards/backwards (warm-up/no charge). Ther ex:   Standing L shoulder AROM (flexion/abd.)- 10x each with 5 sec. Holds.  Push-ups/ serratus punches at //-bars 10 x 2 (fatigue reported).  Standing clothes pins on 2nd shelf in gym (L UE)- 5x.  Shoulder flexion with ball and walkouts in hallway (min. A with ball)- 15 feet x 6.  Standing bicep curls 4# 30x/ hammer supination and pronation 30x (mirror feedback).  Nautilus: seated lat. Pull downs 30# 15x2 (difficulty)/ tricep ext. 30# 15x2 (fatigue). Discussed HEP/ standing IR with pulleys or  (emphasized importance of daily stretching and 3-4x/week strengthening ex.). Manual tx.: 10 min soft tissue to post deltoid trigger points with occasional ischemic compression.  Pt presents with tender to touch trigger points, but tolerated treatment well.    Pt response to treatment for medical necessity. Pt progressing with ADLs and functional UE tasks with PT. Pt benefits from strengthening to increase L shoulder AROM and functional use in a pain free range in order to complete household tasks.         PT Long Term Goals - 04/19/15 1621    PT LONG TERM GOAL #1   Title Pt. I with HEP to increase L shoulder AROM to [facility] as compared to R shoulder in sitting position to progress pain-free mobility.     Time 4   Period Weeks   Status Not Met   PT LONG TERM GOAL #  2   Title Pt. able to lift/carry 10# object with proper body mechanics and no c/o L shoulder pain to improve mobility.    Baseline achieved 1/26   Time 4   Period Weeks   Status Achieved   PT LONG TERM GOAL #3   Title Pt. will decrease QuickDASH to <20% to improve UE functional mobility/ sleeping.    Baseline 38.6% on 12/29. 25% on 1/26. 29% on 2/21   Time 4   Period Weeks   Status Not Met   PT LONG TERM GOAL #4   Title Pt. able to complete household cleaning/ dressing with no L shoulder pain or limitations.     Baseline met 2/21   Time 4   Period Weeks   Status Achieved   PT LONG TERM GOAL #5   Title Pt will increase L shoulder flexion  AROM to >110 deg with minimal upper trap compensation in order wash hair.   Baseline L shoulder flexion: 105 deg 03/17/15. Functional L shoulder flexion in standing: 92 deg.   Time 4   Period Weeks   Status Not Met   PT LONG TERM GOAL #6   Title Pt will increase L shoulder strength MMT to 4/5 in order to return to prior level of function.   Baseline L shoulder flexion and abduction: 3/5 on 1/26. L shoulder flex and abd: 3+/5 on 2/21.   Time 4   Period Weeks   Status Partially Met            Plan - 04/27/15 0937    Clinical Impression Statement Pt presents with decreased ROM in L shoulder flex/abd (90 deg. pain limitations prior to compenstation technique), compensates with L shoulder elevation toward end range.  Pt presents with a "catching" sensation with L shoulder overhead motion, requiring assistance to raise arm overhead.  Pt presents with trigger points to post deltoid , and was tender to touch but tolerated ishemic compression well (improvement noted).     Pt will benefit from skilled therapeutic intervention in order to improve on the following deficits Impaired flexibility;Pain;Improper body mechanics;Postural dysfunction;Decreased mobility;Decreased activity tolerance;Decreased endurance;Decreased range of motion;Decreased strength;Hypomobility;Impaired UE functional use   Rehab Potential Good   PT Frequency 1x / week   PT Duration 4 weeks   PT Treatment/Interventions Moist Heat;Cryotherapy;Electrical Stimulation;Functional mobility training;Therapeutic activities;Therapeutic exercise;Manual techniques;Patient/family education;Passive range of motion   PT Next Visit Plan Progress HEP   Consulted and Agree with Plan of Care Patient        Problem List Patient Active Problem List   Diagnosis Date Noted  . Rectal bleeding 06/10/2014  . AP (abdominal pain) 12/29/2013  . Ventral hernia without obstruction or gangrene 12/18/2013  . Crohn's disease ([place]) 12/18/2013   Pura Spice, PT, DPT # [id number]   04/27/2015, 9:42 AM  [facility] [address] [place], [place], [address] Phone: [phone]   Fax:  [phone]  Name: JAYCELYNN KNICKERBOCKER MRN: [id number] Date of Birth: 1945-05-08

## 2015-05-03 ENCOUNTER — Ambulatory Visit: Payer: Federal, State, Local not specified - PPO | Admitting: Physical Therapy

## 2015-05-04 ENCOUNTER — Encounter: Payer: Federal, State, Local not specified - PPO | Admitting: Physical Therapy

## 2015-05-10 ENCOUNTER — Ambulatory Visit: Payer: Federal, State, Local not specified - PPO | Admitting: Physical Therapy

## 2015-05-10 ENCOUNTER — Encounter: Payer: Self-pay | Admitting: Physical Therapy

## 2015-05-10 DIAGNOSIS — M6281 Muscle weakness (generalized): Secondary | ICD-10-CM | POA: Diagnosis not present

## 2015-05-10 DIAGNOSIS — M25612 Stiffness of left shoulder, not elsewhere classified: Secondary | ICD-10-CM

## 2015-05-10 DIAGNOSIS — M25512 Pain in left shoulder: Secondary | ICD-10-CM

## 2015-05-11 NOTE — Therapy (Signed)
[facility] [address] [place], [place], [address] Phone: [phone]   Fax:  [phone]  Physical Therapy Treatment  Patient Details  Name: Joanna Ortiz MRN: [id number] Date of Birth: 1945-06-26 No Data Recorded  Encounter Date: 05/10/2015    Past Medical History  Diagnosis Date  . Crohn disease ([place])   . Thyroid disease     Past Surgical History  Procedure Laterality Date  . Abdominal hysterectomy  1987  . Thyroid surgery  1992  . Small intestine surgery  2012  . Colonoscopy  2013  . Colon surgery  06/06/2011    Resection of terminal ileum and previous ileocolonic anastomosis for recurrent Crohn's disease. 3 focal strictures identified.    There were no vitals filed for this visit.  Visit Diagnosis:  Muscle weakness  Stiffness of left shoulder joint  Left shoulder pain  Muscle weakness (generalized)      Pt. C/o 1.5/10 L shoulder pain currently.  Pt. Reports she is doing better overall.      Objective: UBE: 3 mins forwards/backwards (warm-up/no charge). Ther ex: Standing L shoulder AROM (flexion/abd.)- 10x each with 5 sec. Holds. Reviewed entire HEP in depth.  Issued new HEP (see attachments)- BTB progression with HEP.    Pt response to treatment for medical necessity. Pt progressing with ADLs and functional UE tasks with PT. Pt benefits from strengthening to increase L shoulder AROM and functional use in a pain free range in order to complete household tasks.         PT Long Term Goals - 04/19/15 1621    PT LONG TERM GOAL #1   Title Pt. I with HEP to increase L shoulder AROM to [facility] as compared to R shoulder in sitting position to progress pain-free mobility.     Time 4   Period Weeks   Status Not Met   PT LONG TERM GOAL #2   Title Pt. able to lift/carry 10# object with proper body mechanics and no c/o L shoulder pain to improve mobility.    Baseline achieved 1/26   Time 4   Period Weeks    Status Achieved   PT LONG TERM GOAL #3   Title Pt. will decrease QuickDASH to <20% to improve UE functional mobility/ sleeping.    Baseline 38.6% on 12/29. 25% on 1/26. 29% on 2/21   Time 4   Period Weeks   Status Not Met   PT LONG TERM GOAL #4   Title Pt. able to complete household cleaning/ dressing with no L shoulder pain or limitations.     Baseline met 2/21   Time 4   Period Weeks   Status Achieved   PT LONG TERM GOAL #5   Title Pt will increase L shoulder flexion AROM to >110 deg with minimal upper trap compensation in order wash hair.   Baseline L shoulder flexion: 105 deg 03/17/15. Functional L shoulder flexion in standing: 92 deg.   Time 4   Period Weeks   Status Not Met   PT LONG TERM GOAL #6   Title Pt will increase L shoulder strength MMT to 4/5 in order to return to prior level of function.   Baseline L shoulder flexion and abduction: 3/5 on 1/26. L shoulder flex and abd: 3+/5 on 2/21.   Time 4   Period Weeks   Status Partially Met               Plan - 05/11/15 1823  Clinical Impression Statement Pt. has progressed with new HEP and PT is planning on discharging next tx. with focus on self progression of HEP.  L shoulder AROM remains limited but functional.  Generalized shoulder muscle weakness is primary focus to promote return to daily housework/ chores.     Pt will benefit from skilled therapeutic intervention in order to improve on the following deficits Impaired flexibility;Pain;Improper body mechanics;Postural dysfunction;Decreased mobility;Decreased activity tolerance;Decreased endurance;Decreased range of motion;Decreased strength;Hypomobility;Impaired UE functional use   Rehab Potential Good   PT Frequency 1x / week   PT Duration 4 weeks   PT Treatment/Interventions Moist Heat;Cryotherapy;Electrical Stimulation;Functional mobility training;Therapeutic activities;Therapeutic exercise;Manual techniques;Patient/family education;Passive range of motion    PT Next Visit Plan Progress HEP/ Discharge next tx. session.    PT Home Exercise Plan continue current plan        Problem List Patient Active Problem List   Diagnosis Date Noted  . Rectal bleeding 06/10/2014  . AP (abdominal pain) 12/29/2013  . Ventral hernia without obstruction or gangrene 12/18/2013  . Crohn's disease ([place]) 12/18/2013   Pura Spice, PT, DPT # [phone]   05/11/2015, 6:28 PM  [facility] [address] [place], [place], [address] Phone: [phone]   Fax:  [phone]  Name: Joanna Ortiz MRN: [id number] Date of Birth: [birth year]

## 2015-05-17 ENCOUNTER — Ambulatory Visit: Payer: Federal, State, Local not specified - PPO | Admitting: Physical Therapy

## 2015-05-17 DIAGNOSIS — M25612 Stiffness of left shoulder, not elsewhere classified: Secondary | ICD-10-CM

## 2015-05-17 DIAGNOSIS — M6281 Muscle weakness (generalized): Secondary | ICD-10-CM | POA: Diagnosis not present

## 2015-05-17 DIAGNOSIS — M25512 Pain in left shoulder: Secondary | ICD-10-CM

## 2015-05-18 NOTE — Therapy (Signed)
[place] [facility] [address] [place], [place], [address] Phone: [phone]   Fax:  [phone]  Physical Therapy Treatment  Patient Details  Name: Joanna Ortiz MRN: [id number] Date of Birth: Aug 08, 1945 No Data Recorded  Encounter Date: 05/17/2015      PT End of Session - 05/18/15 2014    Visit Number 25   Number of Visits 26   Date for PT Re-Evaluation 05/17/15   Authorization - Visit Number 25   Authorization - Number of Visits 32   PT Start Time 0925   PT Stop Time 1019   PT Time Calculation (min) 54 min   Activity Tolerance Patient tolerated treatment well;Patient limited by fatigue;Patient limited by pain   Behavior During Therapy [facility] for tasks assessed/performed      Past Medical History  Diagnosis Date  . Crohn disease ([place])   . Thyroid disease     Past Surgical History  Procedure Laterality Date  . Abdominal hysterectomy  1987  . Thyroid surgery  1992  . Small intestine surgery  2012  . Colonoscopy  2013  . Colon surgery  06/06/2011    Resection of terminal ileum and previous ileocolonic anastomosis for recurrent Crohn's disease. 3 focal strictures identified.    There were no vitals filed for this visit.  Visit Diagnosis:  Muscle weakness  Stiffness of left shoulder joint  Left shoulder pain  Muscle weakness (generalized)  Shoulder joint stiffness, left      Subjective Assessment - 05/18/15 2010    Subjective Pt. reports no new complaints and feels she doing well with HEP.  Pt. feels ready for discharge from PT at this time.  Pt. reports 1-2/10 L shoulder pain.     Patient is accompained by: Family member   Limitations Standing;Writing;House hold activities   Patient Stated Goals Increase L shoulder ROM/ strength/ pain-free mobility   Currently in Pain? Yes   Pain Score 1    Pain Location Shoulder   Pain Orientation Left   Pain Onset More than a month ago      Objective: UBE: 3 mins  forwards/backwards (warm-up/no charge). Ther ex: Standing L shoulder AROM (flexion/abd./IR/ER/ext.)- 10x each with 5 sec. Holds. Reviewed new HEP/ discussed Silver Sneakers.  Overhead reaching to 2nd shelf with clothes pins.     Pt response to treatment for medical necessity.  Discharge from PT at this time with consistent HEP to maintain shoulder strengthening/ functional ROM with daily tasks.            PT Education - 05/18/15 2013    Education provided Yes   Education Details Reviewed entire HEP (stretches/ resisted ex.).    Person(s) Educated Patient   Methods Explanation;Demonstration;Handout   Comprehension Verbalized understanding;Returned demonstration             PT Long Term Goals - 05/18/15 2019    PT LONG TERM GOAL #1   Title Pt. I with HEP to increase L shoulder AROM to [facility] as compared to R shoulder in sitting position to progress pain-free mobility.     Time 4   Period Weeks   Status Partially Met   PT LONG TERM GOAL #2   Title Pt. able to lift/carry 10# object with proper body mechanics and no c/o L shoulder pain to improve mobility.    Baseline achieved 1/26   Time 4   Period Weeks   Status Achieved   PT LONG TERM GOAL #3   Title  Pt. will decrease QuickDASH to <20% to improve UE functional mobility/ sleeping.    Baseline 25% on May 21, 2022   Time 4   Period Weeks   Status Partially Met   PT LONG TERM GOAL #4   Title Pt. able to complete household cleaning/ dressing with no L shoulder pain or limitations.     Baseline met 2/21   Time 4   Period Weeks   Status Achieved   PT LONG TERM GOAL #5   Title Pt will increase L shoulder flexion AROM to >110 deg with minimal upper trap compensation in order wash hair.   Time 4   Period Weeks   Status Partially Met   PT LONG TERM GOAL #6   Title Pt will increase L shoulder strength MMT to 4/5 in order to return to prior level of function.   Baseline L shoulder flexion 4/5 MMT in limited range.    Period Weeks    Status Partially Met               Plan - 05/18/15 2013/04/06    Clinical Impression Statement Pt. is showing signs of max. physical potential with skilled PT services at this time.  No significant increase in L shoulder flexion/ abd. over past several weeks.  QuickDASH: 25%.  Pt. independent with a progressive HEP focusing on ROM/ strengthening.  Discharge from PT at this time.     Pt will benefit from skilled therapeutic intervention in order to improve on the following deficits Impaired flexibility;Pain;Improper body mechanics;Postural dysfunction;Decreased mobility;Decreased activity tolerance;Decreased endurance;Decreased range of motion;Decreased strength;Hypomobility;Impaired UE functional use   Rehab Potential Good   PT Frequency 1x / week   PT Duration 4 weeks   PT Treatment/Interventions Moist Heat;Cryotherapy;Electrical Stimulation;Functional mobility training;Therapeutic activities;Therapeutic exercise;Manual techniques;Patient/family education;Passive range of motion   PT Next Visit Plan Discharge from PT at this time.           G-Codes - May 21, 2015 04-07-19    Functional Assessment Tool Used QuickDASH/ clinical judgement/ pain/ ROM   Functional Limitation Carrying, moving and handling objects   Carrying, Moving and Handling Objects Current Status [phone]) At least 1 percent but less than 20 percent impaired, limited or restricted   Carrying, Moving and Handling Objects Goal Status ([id number]) At least 1 percent but less than 20 percent impaired, limited or restricted   Carrying, Moving and Handling Objects Discharge Status [phone]) At least 1 percent but less than 20 percent impaired, limited or restricted      Problem List Patient Active Problem List   Diagnosis Date Noted  . Rectal bleeding 06/10/2014  . AP (abdominal pain) 12/29/2013  . Ventral hernia without obstruction or gangrene 12/18/2013  . Crohn's disease ([place]) 12/18/2013   Pura Spice, PT, DPT # [phone]   05/18/2015,  8:23 PM   [facility] [address] [place], [place], [address] Phone: [phone]   Fax:  [phone]  Name: Joanna Ortiz MRN: [id number] Date of Birth: December 26, 1945

## 2015-05-26 DIAGNOSIS — R739 Hyperglycemia, unspecified: Secondary | ICD-10-CM | POA: Insufficient documentation

## 2015-06-06 ENCOUNTER — Other Ambulatory Visit: Payer: Self-pay | Admitting: Family Medicine

## 2015-06-06 DIAGNOSIS — Z1231 Encounter for screening mammogram for malignant neoplasm of breast: Secondary | ICD-10-CM

## 2015-06-08 ENCOUNTER — Ambulatory Visit
Admission: RE | Admit: 2015-06-08 | Discharge: 2015-06-08 | Disposition: A | Discharge status: Home / Self Care | Payer: Federal, State, Local not specified - PPO | Source: Ambulatory Visit | Attending: Family Medicine | Admitting: Family Medicine

## 2015-06-08 DIAGNOSIS — Z1231 Encounter for screening mammogram for malignant neoplasm of breast: Secondary | ICD-10-CM | POA: Insufficient documentation

## 2015-06-08 DIAGNOSIS — R928 Other abnormal and inconclusive findings on diagnostic imaging of breast: Secondary | ICD-10-CM | POA: Insufficient documentation

## 2015-06-28 ENCOUNTER — Other Ambulatory Visit: Payer: Self-pay | Admitting: Family Medicine

## 2015-06-28 DIAGNOSIS — R928 Other abnormal and inconclusive findings on diagnostic imaging of breast: Secondary | ICD-10-CM

## 2015-07-07 ENCOUNTER — Ambulatory Visit
Admission: RE | Admit: 2015-07-07 | Discharge: 2015-07-07 | Disposition: A | Discharge status: Home / Self Care | Payer: Federal, State, Local not specified - PPO | Source: Ambulatory Visit | Attending: Family Medicine | Admitting: Family Medicine

## 2015-07-07 DIAGNOSIS — R928 Other abnormal and inconclusive findings on diagnostic imaging of breast: Secondary | ICD-10-CM

## 2015-07-28 ENCOUNTER — Other Ambulatory Visit
Admission: RE | Admit: 2015-07-28 | Discharge: 2015-07-28 | Disposition: A | Discharge status: Home / Self Care | Payer: Federal, State, Local not specified - PPO | Source: Ambulatory Visit | Attending: Otolaryngology | Admitting: Otolaryngology

## 2015-07-28 DIAGNOSIS — E039 Hypothyroidism, unspecified: Secondary | ICD-10-CM | POA: Diagnosis not present

## 2015-07-28 LAB — T4, FREE: Free T4: 0.77 ng/dL (ref 0.61–1.12)

## 2015-07-28 LAB — TSH: TSH: 0.995 u[IU]/mL (ref 0.350–4.500)

## 2015-07-29 LAB — T3, FREE: T3, Free: 2.9 pg/mL (ref 2.0–4.4)

## 2015-08-10 DIAGNOSIS — Z8619 Personal history of other infectious and parasitic diseases: Secondary | ICD-10-CM | POA: Insufficient documentation

## 2015-09-06 ENCOUNTER — Encounter: Payer: Self-pay | Admitting: *Deleted

## 2015-09-07 ENCOUNTER — Ambulatory Visit: Payer: Federal, State, Local not specified - PPO | Admitting: General Surgery

## 2015-09-08 ENCOUNTER — Telehealth: Payer: Self-pay | Admitting: General Surgery

## 2015-09-08 NOTE — Telephone Encounter (Signed)
09-08-15 @ 10:44 CALLED & L/M @ H# FOR PT TO RETURN CALL TO RE-SCH APPT WITH DR BYRNTT FROM 09-07-15/MTH

## 2015-09-13 ENCOUNTER — Ambulatory Visit: Payer: Medicare Other | Admitting: General Surgery

## 2015-10-20 ENCOUNTER — Encounter: Payer: Self-pay | Admitting: *Deleted

## 2015-10-27 ENCOUNTER — Encounter: Payer: Self-pay | Admitting: General Surgery

## 2015-10-27 ENCOUNTER — Ambulatory Visit (INDEPENDENT_AMBULATORY_CARE_PROVIDER_SITE_OTHER): Payer: Federal, State, Local not specified - PPO | Admitting: General Surgery

## 2015-10-27 DIAGNOSIS — M6208 Separation of muscle (nontraumatic), other site: Secondary | ICD-10-CM

## 2015-10-27 NOTE — Patient Instructions (Signed)
Follow up appointment to be announced.  

## 2015-10-27 NOTE — Progress Notes (Addendum)
Patient ID: Joanna Ortiz, female   DOB: 16-Mar-1945, 70 y.o.   MRN: [id number]  Chief Complaint  Patient presents with  . Abdominal Pain    HPI Joanna Ortiz is a 70 y.o. female here today following up for continued of abdominal pain. Patient was seen at [facility] three weeks ago and had a MRI.Patient states she is still weak feeling and have pain in her abdomen area with is having nausea and diarrhea.In the middle of the night she gets up to move her bowels than in the morning again after she get up.   The patient describes that a full feeling develops after her first meal the day and becomes more pronounced as each additional meal is ingested. No vomiting, some nausea. Patient has felt more tried since the being of the year. A prednisone taper was initiated, and then put on hold when culture showed C. difficile in June 2017. She received a course of vancomycin 2. She does report having eventually been on steroids for about 6 weeks. The patient reports minimal improvement in her abdominal symptoms after these interventions, but did report a significant decrease in stool frequency.Marland Kitchen   He patient has kept her regular follow-up visits with her gastroenterologist at [facility], usually seeing the physician's assistant. She is scheduled to meet with the actual physician in the next few weeks.   HPI  Past Medical History:  Diagnosis Date  . Crohn disease ([place])   . Thyroid disease     Past Surgical History:  Procedure Laterality Date  . ABDOMINAL HYSTERECTOMY  1987  . APPENDECTOMY    . BREAST BIOPSY Bilateral    neg  . COLON SURGERY  06/06/2011   Resection of terminal ileum and previous ileocolonic anastomosis for recurrent Crohn's disease. 3 focal strictures identified.  . COLONOSCOPY  2013  . SMALL INTESTINE SURGERY  2012  . THYROID SURGERY  1992    Family History  Problem Relation Age of Onset  . Breast cancer Sister 51    Social History Social History  Substance Use  Topics  . Smoking status: Former Smoker    Packs/day: 1.00    Years: 12.00    Types: Cigarettes  . Smokeless tobacco: Not on file  . Alcohol use No    Allergies  Allergen Reactions  . Erythromycin Hives  . Hydromorphone Hives  . Cefuroxime Nausea And Vomiting  . Ciprocinonide [Fluocinolone]     Muscle pain   . Codeine     Other reaction(s): Vomiting  . Floxin [Ofloxacin] Nausea And Vomiting  . Keflex [Cephalexin] Nausea And Vomiting  . Levofloxacin Nausea And Vomiting  . Sulfa Antibiotics Nausea And Vomiting  . Ciprofloxacin Nausea And Vomiting    Current Outpatient Prescriptions  Medication Sig Dispense Refill  . Acetaminophen 500 MG coapsule Take 1,000 mg by mouth 3 (three) times daily as needed for fever.    . cholestyramine (QUESTRAN) 4 G packet Take 4 g by mouth 2 (two) times daily as needed (stomach problems).   12  . colestipol (COLESTID) 1 G tablet Take 1 g by mouth daily as needed (for diarrhea).     . cyanocobalamin (,VITAMIN B-12,) 1000 MCG/ML injection Inject 1,000 mcg into the muscle every 30 (thirty) days.    Marland Kitchen HUMIRA PEN 40 MG/0.8ML PNKT Inject 40 mg into the skin every 14 (fourteen) days.     . Multiple Vitamin (MULTIVITAMIN) tablet Take 1 tablet by mouth daily.    . nitrofurantoin, macrocrystal-monohydrate, (MACROBID) 100 MG  capsule Take 1 capsule (100 mg total) by mouth 2 (two) times daily. 14 capsule 0  . Omega-3 Fatty Acids (FISH OIL) 1000 MG CAPS Take 1,000 mg by mouth daily.     Marland Kitchen SYNTHROID 100 MCG tablet Take 100 mcg by mouth daily before breakfast.   11  . thyroid (ARMOUR) 30 MG tablet Take 30 mg by mouth every morning.    . TURMERIC CURCUMIN PO Take 1 capsule by mouth daily.     No current facility-administered medications for this visit.     Review of Systems Review of Systems  Constitutional: Negative.   Respiratory: Negative.   Cardiovascular: Negative.   Gastrointestinal: Positive for abdominal pain, diarrhea and nausea.    Blood  pressure (!) 152/72, pulse 92, resp. rate 14, height 5\' 8"  (1.727 m), weight 177 lb (80.3 kg).  The patient's weight is up 4 pounds from her 12/16/2013 office visit.  Physical Exam Physical Exam  Constitutional: She is oriented to person, place, and time. She appears well-developed and well-nourished.  Eyes: Conjunctivae are normal. No scleral icterus.  Neck: Neck supple.  Cardiovascular: Normal rate, regular rhythm and normal heart sounds.   Pulses:      Dorsalis pedis pulses are 2+ on the right side, and 2+ on the left side.       Posterior tibial pulses are 2+ on the right side, and 2+ on the left side.  No edema  Pulmonary/Chest: Effort normal and breath sounds normal.  Abdominal: Soft. Bowel sounds are normal. There is no tenderness.    Lymphadenopathy:    She has no cervical adenopathy.  Neurological: She is alert and oriented to person, place, and time.  Skin: Skin is dry.    Data Reviewed MRI with inflammatory disease protocol completed 09/09/2015 report reviewed.( Images images not available).  Impression: 1.A few short segment areas of mild small bowel wall enhancement without additional significant inflammatory changes, which may reflect mild active inflammatory disease. No extraluminal competitions. 2.Hepatic steatosis.  Gallbladder was normal. Normal common bile duct.    Colonoscopy biopsies dated 06/15/2014 showed chronic active colitis with erosions, glandular hyperplasia and granulation tissue. No malignancy. Candidate the anastomosis with the small bowel. She just completed due to a report of rectal bleeding.  Upper endoscopy completed 06/15/2014 large duodenal diverticulum. While not mentioned on the CT scan of 12/22/2013 this is evident and is perhaps 7 cm in diameter.  CT scan of 12/22/2013 did not confirm a clinical impression of a ventral hernia. Cholelithiasis reported (see MRI above).  Gastric emptying study completed 03/15/2011 was normal at 90  minutes.  Abdominal exploration completed 06/06/2011  for abdominal pain and weight loss showed multiple areas of Crohn's disease with fat wrapping and marked bowel wall thickening involving a proximally a 45 cm length from the ileocolic anastomosis.  Pathology:TERMINAL ILEUM/PARTIAL RIGHT COLON RESECTION:  CHRONIC ACTIVE COLITIS WITH FEATURES CONSISTENT WITH HISTORY OF  CROHN'S DISEASE.  ULCERATION WITH TRANSMURAL INFLAMMATION IS NOTED WITH  A SMALL GRANULOMA OBSERVED.  NEGATIVE FOR DYSPLASIA AND MALIGNANCY.  THE SPECIMEN MARGINS APPEAR CLEAR.  THE PREVIOUS ANASTOMOSIS LINE IS NOTED AND APPEARS INTACT.  FIVE BENIGN LYMPH NODES ARE PRESENT.  Review of the due to GI note from 09/21/2015 was notable for a mild elevation of CRP at 2.62. Normal competence metabolic panel. The exception of a modestly low serum calcium of 8.5, normal CBC.  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   Assessment    Abdominal discomfort/bloating, history Crohn's disease.  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      Plan    The patient reports there was some discussion at her last GI visit to change her Humira dose.  The patient reminded me that prior to her 2013 surgery no etiologic images of severe disease had been evident, and she is little reassured by her recent MRI study (results of which she had not received at the time of today's visit, but were obtained through care every where.  Symptoms of borborygmi would suggest some degree of either partial obstruction or intermittent emptying of the large duodenal diverticulum. Her symptoms do not suggest biliary tract disease.  She is to had 2 previous resections for Crohn's disease, and additional small bowel resections would ideally be avoided. Whether the duodenal diverticulum is  symptomatic is difficult to assess. It was widely patent at the time of her recent upper endoscopy and while large is superiorly laterally orientated to the duodenal C-loop making it unlikely units producing compression.  The patient might be best served by an enteroclysis study with special attention to the proximal duodenal diverticulum as well as the areas suggested on MRI to determine if low-grade obstruction is present. This would be best completed at an institution which frequently completes the study.  Would defer further investigation until her follow-up appointment at [facility] is completed.      The majority of the one-hour time spent with the patient's case was on chart review.  This information has been scribed by Gaspar Cola CMA.  [name] 10/29/2015, 1:02 PM

## 2015-10-28 LAB — MAGNESIUM: Magnesium: 1.9 mg/dL (ref 1.6–2.3)

## 2015-10-29 DIAGNOSIS — M6208 Separation of muscle (nontraumatic), other site: Secondary | ICD-10-CM | POA: Insufficient documentation

## 2015-11-01 ENCOUNTER — Telehealth: Payer: Self-pay | Admitting: *Deleted

## 2015-11-01 NOTE — Telephone Encounter (Signed)
I talked with the patient , she agrees and she will let us know once she has her appointment next week with the GI department, (she could not remember which day her appointment was).

## 2015-11-01 NOTE — Telephone Encounter (Signed)
-----   Message from [name], MD sent at 10/29/2015  1:32 PM EDT ----- Please notify the patient on going to hold off on any radiology studies until her next visit with the do GI staff. I have sent him a detailed letter with some recommendations.

## 2015-11-01 NOTE — Telephone Encounter (Signed)
-----   Message from [name], MD sent at 10/29/2015  2:46 PM EDT ----- These notify the patient her serum magnesium level was normal. She should continue her present supplements. ----- Message ----- From: Lavone Neri Lab Results In Sent: 10/28/2015   5:37 AM To: [name], MD

## 2015-11-01 NOTE — Telephone Encounter (Signed)
Notified patient as instructed, patient pleased. Discussed follow-up appointments, patient agrees  

## 2016-03-12 ENCOUNTER — Emergency Department: Payer: Federal, State, Local not specified - PPO

## 2016-03-12 ENCOUNTER — Encounter: Payer: Self-pay | Admitting: *Deleted

## 2016-03-12 ENCOUNTER — Emergency Department
Admission: EM | Admit: 2016-03-12 | Discharge: 2016-03-12 | Disposition: A | Discharge status: Home / Self Care | Payer: Federal, State, Local not specified - PPO | Attending: Emergency Medicine | Admitting: Emergency Medicine

## 2016-03-12 DIAGNOSIS — R519 Headache, unspecified: Secondary | ICD-10-CM

## 2016-03-12 DIAGNOSIS — R51 Headache: Secondary | ICD-10-CM

## 2016-03-12 DIAGNOSIS — M316 Other giant cell arteritis: Secondary | ICD-10-CM | POA: Insufficient documentation

## 2016-03-12 DIAGNOSIS — Z87891 Personal history of nicotine dependence: Secondary | ICD-10-CM | POA: Insufficient documentation

## 2016-03-12 DIAGNOSIS — Z79899 Other long term (current) drug therapy: Secondary | ICD-10-CM | POA: Diagnosis not present

## 2016-03-12 LAB — COMPREHENSIVE METABOLIC PANEL
ALBUMIN: 3.4 g/dL — AB (ref 3.5–5.0)
ALT: 19 U/L (ref 14–54)
AST: 20 U/L (ref 15–41)
Alkaline Phosphatase: 62 U/L (ref 38–126)
Anion gap: 7 (ref 5–15)
BUN: 7 mg/dL (ref 6–20)
CHLORIDE: 105 mmol/L (ref 101–111)
CO2: 25 mmol/L (ref 22–32)
CREATININE: 0.73 mg/dL (ref 0.44–1.00)
Calcium: 8.3 mg/dL — ABNORMAL LOW (ref 8.9–10.3)
GFR calc non Af Amer: >60 mL/min (ref >60)
GLUCOSE: 100 mg/dL — AB (ref 65–99)
Potassium: 3.4 mmol/L — ABNORMAL LOW (ref 3.5–5.1)
SODIUM: 137 mmol/L (ref 135–145)
Total Bilirubin: 0.6 mg/dL (ref 0.3–1.2)
Total Protein: 7 g/dL (ref 6.5–8.1)

## 2016-03-12 LAB — CBC
HCT: 35.6 % (ref 35.0–47.0)
HEMOGLOBIN: 11.8 g/dL — AB (ref 12.0–16.0)
MCH: 27.8 pg (ref 26.0–34.0)
MCHC: 33.1 g/dL (ref 32.0–36.0)
MCV: 83.9 fL (ref 80.0–100.0)
PLATELETS: 189 10*3/uL (ref 150–440)
RBC: 4.24 MIL/uL (ref 3.80–5.20)
RDW: 16 % — ABNORMAL HIGH (ref 11.5–14.5)
WBC: 10.1 10*3/uL (ref 3.6–11.0)

## 2016-03-12 LAB — SEDIMENTATION RATE: Sed Rate: 49 mm/hr — ABNORMAL HIGH (ref 0–30)

## 2016-03-12 MED ORDER — PREDNISONE 20 MG PO TABS: 40.0000 mg | ORAL_TABLET | Freq: Every day | ORAL | 0 refills | Status: AC

## 2016-03-12 MED ORDER — PREDNISONE 20 MG PO TABS
60.0000 mg | ORAL_TABLET | Freq: Once | ORAL | Status: AC
Start: 2016-03-12 — End: 2016-03-12
  Administered 2016-03-12: 60 mg via ORAL
  Filled 2016-03-12: qty 3

## 2016-03-12 MED ORDER — SODIUM CHLORIDE 0.9 % IV SOLN
1000.0000 mL | Freq: Once | INTRAVENOUS | Status: AC
  Administered 2016-03-12: 1000 mL via INTRAVENOUS

## 2016-03-12 MED ORDER — DIPHENHYDRAMINE HCL 50 MG/ML IJ SOLN
12.5000 mg | Freq: Once | INTRAMUSCULAR | Status: AC
  Administered 2016-03-12: 12.5 mg via INTRAVENOUS
  Filled 2016-03-12: qty 1

## 2016-03-12 MED ORDER — TRAMADOL HCL 50 MG PO TABS: 50.0000 mg | ORAL_TABLET | Freq: Four times a day (QID) | ORAL | 0 refills | Status: DC | PRN

## 2016-03-12 MED ORDER — DEXTROSE 5 % IV SOLN
20.0000 mg | Freq: Once | INTRAVENOUS | Status: AC
  Administered 2016-03-12: 20 mg via INTRAVENOUS
  Filled 2016-03-12: qty 4

## 2016-03-12 NOTE — ED Triage Notes (Addendum)
States she was just released from hospital for pneumonia, states this AM she woke up with a headache, denies any injury or blood thinner use, denies any hx of migranes, awake and alert in no acute distress, states she has been taking mucinex and doxycycline

## 2016-03-12 NOTE — ED Provider Notes (Signed)
[facility] Emergency Department Provider Note   ____________________________________________    I have reviewed the triage vital signs and the nursing notes.   HISTORY  Chief Complaint Headache     HPI Joanna Ortiz is a 71 y.o. female who presents with complaints of headache. Patient recently discharged from outside hospital after being treated for pneumonia. She reports her symptoms have improved however this morning she woke up with a left-sided headache overlying her temple. She is never had this before. She does not typically have headaches. She denies nausea or vomiting. No neck pain. No focal deficits. No decreased visual acuity. No fevers or chills   Past Medical History:  Diagnosis Date  . Crohn disease ([place])   . Thyroid disease     Patient Active Problem List   Diagnosis Date Noted  . Diastasis recti 10/29/2015  . Rectal bleeding 06/10/2014  . AP (abdominal pain) 12/29/2013  . Crohn's disease ([place]) 12/18/2013    Past Surgical History:  Procedure Laterality Date  . ABDOMINAL HYSTERECTOMY  1987  . APPENDECTOMY    . BREAST BIOPSY Bilateral    neg  . COLON SURGERY  06/06/2011   Resection of terminal ileum and previous ileocolonic anastomosis for recurrent Crohn's disease. 3 focal strictures identified.  . COLONOSCOPY  2013  . SMALL INTESTINE SURGERY  2012  . THYROID SURGERY  1992    Prior to Admission medications   Medication Sig Start Date End Date Taking? Authorizing Provider  Acetaminophen 500 MG coapsule Take 1,000 mg by mouth 3 (three) times daily as needed for fever.    Historical Provider, MD  cholestyramine Lucrezia Starch) 4 G packet Take 4 g by mouth 2 (two) times daily as needed (stomach problems).     Historical Provider, MD  colestipol (COLESTID) 1 G tablet Take 1 g by mouth daily as needed (for diarrhea).     Historical Provider, MD  cyanocobalamin (,VITAMIN B-12,) 1000 MCG/ML injection Inject 1,000 mcg into the  muscle every 30 (thirty) days.    Historical Provider, MD  HUMIRA PEN 40 MG/0.8ML PNKT Inject 40 mg into the skin every 14 (fourteen) days.  09/08/13   Historical Provider, MD  Multiple Vitamin (MULTIVITAMIN) tablet Take 1 tablet by mouth daily.    Historical Provider, MD  nitrofurantoin, macrocrystal-monohydrate, (MACROBID) 100 MG capsule Take 1 capsule (100 mg total) by mouth 2 (two) times daily. 01/07/15   Paulina Fusi, MD  Omega-3 Fatty Acids (FISH OIL) 1000 MG CAPS Take 1,000 mg by mouth daily.     Historical Provider, MD  SYNTHROID 100 MCG tablet Take 100 mcg by mouth daily before breakfast.  04/27/14   Historical Provider, MD  thyroid (ARMOUR) 30 MG tablet Take 30 mg by mouth every morning.    Historical Provider, MD  TURMERIC CURCUMIN PO Take 1 capsule by mouth daily.    Historical Provider, MD     Allergies Erythromycin; Hydromorphone; Cefuroxime; Ciprocinonide [fluocinolone]; Codeine; Floxin [ofloxacin]; Keflex [cephalexin]; Levofloxacin; Sulfa antibiotics; and Ciprofloxacin  Family History  Problem Relation Age of Onset  . Breast cancer Sister 72    Social History Social History  Substance Use Topics  . Smoking status: Former Smoker    Packs/day: 1.00    Years: 12.00    Types: Cigarettes  . Smokeless tobacco: Not on file  . Alcohol use No    Review of Systems  Constitutional: No fever/chills Eyes: No visual changes.  ENT: No sore throat. Cardiovascular: Denies chest pain. Respiratory: Denies  shortness of breath. Gastrointestinal: No abdominal pain.  No nausea, no vomiting.   Genitourinary: Negative for dysuria. Musculoskeletal: Negative for back pain. Skin: Negative for rash. Neurological: Negative for headaches or weakness  10-point ROS otherwise negative.  ____________________________________________   PHYSICAL EXAM:  VITAL SIGNS: ED Triage Vitals  Enc Vitals Group     BP 03/12/16 1033 112/75     Pulse Rate 03/12/16 1033 95     Resp 03/12/16 1033 16      Temp 03/12/16 1033 98.4 F (36.9 C)     Temp Source 03/12/16 1033 Oral     SpO2 03/12/16 1033 97 %     Weight 03/12/16 1033 180 lb (81.6 kg)     Height 03/12/16 1033 '5\' 6"'$  (1.676 m)     Head Circumference --      Peak Flow --      Pain Score 03/12/16 1035 8     Pain Loc --      Pain Edu? --      Excl. in [place]? --     Constitutional: Alert and oriented. No acute distress.  Eyes: Conjunctivae are normal.  Head: Atraumatic.Mildly tender over the left temple Nose: No congestion/rhinnorhea. Mouth/Throat: Mucous membranes are moist.   Neck:  Painless ROM, no vertebral tenderness palpation Cardiovascular:  Good peripheral circulation. Respiratory: Normal respiratory effort.  No retractions.  Gastrointestinal: Soft and nontender. No distention.  No CVA tenderness. Genitourinary: deferred Musculoskeletal:  Warm and well perfused Neurologic:  Normal speech and language. No gross focal neurologic deficits are appreciated.  Skin:  Skin is warm, dry and intact. No rash noted. Psychiatric: Mood and affect are normal. Speech and behavior are normal.  ____________________________________________   LABS (all labs ordered are listed, but only abnormal results are displayed)  Labs Reviewed  CBC - Abnormal; Notable for the following:       Result Value   Hemoglobin 11.8 (*)    RDW 16.0 (*)    All other components within normal limits  COMPREHENSIVE METABOLIC PANEL  SEDIMENTATION RATE   ____________________________________________  EKG  None ____________________________________________  RADIOLOGY  CT head unremarkable ____________________________________________   PROCEDURES  Procedure(s) performed: No    Critical Care performed: No ____________________________________________   INITIAL IMPRESSION / ASSESSMENT AND PLAN / ED COURSE  Pertinent labs & imaging results that were available during my care of the patient were reviewed by me and considered in my medical decision  making (see chart for details).  Patient presents with left-sided headache, differential includes medication side effect, giant cell arteritis, tension headache, less likely stroke/ICH  CT head is unremarkable  We'll treat with IV fluids, Reglan and small dose of Benadryl while awaiting ESR  I have asked Dr. Marcelene Butte to follow up on ESR and re-evaluate patient. If labs unremarkable and patient improved anticipate discharge    ____________________________________________   FINAL CLINICAL IMPRESSION(S) / ED DIAGNOSES  Final diagnoses:  Headache      NEW MEDICATIONS STARTED DURING THIS VISIT:  New Prescriptions   No medications on file     Note:  This document was prepared using Dragon voice recognition software and may include unintentional dictation errors.    Lavonia Drafts, MD 03/12/16 1535

## 2016-03-12 NOTE — Discharge Instructions (Signed)
Please return immediately if condition worsens. Please contact her primary physician or the physician you were given for referral. If you have any specialist physicians involved in her treatment and plan please also contact them. Thank you for using [facility] emergency [facility].  Please take Tylenol as needed for pain. Please contact her physician for further outpatient follow-up and possible biopsy of the left temporal artery. Return here for headache, nausea, vomiting increasing visual disturbances or any other new concerns.

## 2016-03-12 NOTE — ED Provider Notes (Signed)
-----------------------------------------   4:31 PM on 03/12/2016 -----------------------------------------   Blood pressure 112/75, pulse 95, temperature 98.4 F (36.9 C), temperature source Oral, resp. rate 16, height 5\' 6"  (1.676 m), weight 180 lb (81.6 kg), SpO2 97 %.  Assuming care from Dr. Corky Downs.  In short, Joanna Ortiz is a 71 y.o. female with a chief complaint of Headache .  Refer to the original H&P for additional details.  The current plan of care is to *following the patient's sedimentation rate. Her sedimentation rate came back at 49 and the patient was reexamined. She does have a good clinical presentation and reproducible pain over the left temporal artery. She has been on steroids before and was written a prescription for prednisone after being given prednisone 60 mg here in the emergency department. She was advised continue with phone follow-up appointment on Wednesday with Dr. Jefm Bryant. She was advised to discuss with him at that time whether or not to perform a biopsy of the left temporal artery. She was advised to return here if she has any increasing visual disturbances, fever, weakness, or any other new concerns.  " New Prescriptions   PREDNISONE (DELTASONE) 20 MG TABLET    Take 2 tablets (40 mg total) by mouth daily.   TRAMADOL (ULTRAM) 50 MG TABLET    Take 1 tablet (50 mg total) by mouth every 6 (six) hours as needed.  " Patient was advised to return immediately if condition worsens. Patient was advised to follow up with their primary care physician or other specialized physicians involved in their outpatient care. The patient and/or family member/power of attorney had laboratory results reviewed at the bedside. All questions and concerns were addressed and appropriate discharge instructions were distributed by the nursing staff.       Daymon Larsen, MD 03/12/16 [phone]

## 2016-03-12 NOTE — ED Notes (Signed)
AAOx3.  ambulates with easy and steady gait.  MAE equally and strong.  Updated patient on wait times.  Understanding verbalized.

## 2016-05-01 ENCOUNTER — Emergency Department: Payer: Federal, State, Local not specified - PPO

## 2016-05-01 ENCOUNTER — Encounter: Payer: Self-pay | Admitting: Emergency Medicine

## 2016-05-01 ENCOUNTER — Inpatient Hospital Stay
Admission: EM | Admit: 2016-05-01 | Discharge: 2016-05-04 | DRG: 202 | Disposition: A | Discharge status: Home / Self Care | Payer: Federal, State, Local not specified - PPO | Attending: Internal Medicine | Admitting: Internal Medicine

## 2016-05-01 DIAGNOSIS — Z23 Encounter for immunization: Secondary | ICD-10-CM | POA: Diagnosis not present

## 2016-05-01 DIAGNOSIS — Z882 Allergy status to sulfonamides status: Secondary | ICD-10-CM

## 2016-05-01 DIAGNOSIS — Z888 Allergy status to other drugs, medicaments and biological substances status: Secondary | ICD-10-CM

## 2016-05-01 DIAGNOSIS — Z87891 Personal history of nicotine dependence: Secondary | ICD-10-CM

## 2016-05-01 DIAGNOSIS — Z803 Family history of malignant neoplasm of breast: Secondary | ICD-10-CM

## 2016-05-01 DIAGNOSIS — K5090 Crohn's disease, unspecified, without complications: Secondary | ICD-10-CM | POA: Diagnosis present

## 2016-05-01 DIAGNOSIS — J209 Acute bronchitis, unspecified: Principal | ICD-10-CM | POA: Diagnosis present

## 2016-05-01 DIAGNOSIS — Z90710 Acquired absence of both cervix and uterus: Secondary | ICD-10-CM

## 2016-05-01 DIAGNOSIS — T380X5A Adverse effect of glucocorticoids and synthetic analogues, initial encounter: Secondary | ICD-10-CM | POA: Diagnosis present

## 2016-05-01 DIAGNOSIS — I444 Left anterior fascicular block: Secondary | ICD-10-CM | POA: Diagnosis present

## 2016-05-01 DIAGNOSIS — E1165 Type 2 diabetes mellitus with hyperglycemia: Secondary | ICD-10-CM | POA: Diagnosis present

## 2016-05-01 DIAGNOSIS — J9801 Acute bronchospasm: Secondary | ICD-10-CM

## 2016-05-01 DIAGNOSIS — Z96612 Presence of left artificial shoulder joint: Secondary | ICD-10-CM | POA: Diagnosis present

## 2016-05-01 DIAGNOSIS — R05 Cough: Secondary | ICD-10-CM | POA: Diagnosis present

## 2016-05-01 DIAGNOSIS — Z885 Allergy status to narcotic agent status: Secondary | ICD-10-CM | POA: Diagnosis not present

## 2016-05-01 DIAGNOSIS — E039 Hypothyroidism, unspecified: Secondary | ICD-10-CM | POA: Diagnosis present

## 2016-05-01 DIAGNOSIS — Z881 Allergy status to other antibiotic agents status: Secondary | ICD-10-CM

## 2016-05-01 LAB — CREATININE, SERUM
Creatinine, Ser: 0.98 mg/dL (ref 0.44–1.00)
GFR calc Af Amer: >60 mL/min (ref >60)
GFR, EST NON AFRICAN AMERICAN: 57 mL/min — AB (ref >60)

## 2016-05-01 LAB — CBC
HEMATOCRIT: 32.9 % — AB (ref 35.0–47.0)
HEMOGLOBIN: 11.1 g/dL — AB (ref 12.0–16.0)
MCH: 28.8 pg (ref 26.0–34.0)
MCHC: 33.8 g/dL (ref 32.0–36.0)
MCV: 85.1 fL (ref 80.0–100.0)
Platelets: 234 10*3/uL (ref 150–440)
RBC: 3.87 MIL/uL (ref 3.80–5.20)
RDW: 16.7 % — ABNORMAL HIGH (ref 11.5–14.5)
WBC: 15 10*3/uL — ABNORMAL HIGH (ref 3.6–11.0)

## 2016-05-01 LAB — INFLUENZA PANEL BY PCR (TYPE A & B)
INFLBPCR: NEGATIVE
Influenza A By PCR: NEGATIVE

## 2016-05-01 MED ORDER — SODIUM CHLORIDE 0.9 % IV SOLN: 250.0000 mL | INTRAVENOUS | Status: DC | PRN

## 2016-05-01 MED ORDER — DOXYCYCLINE HYCLATE 100 MG PO TABS
100.0000 mg | ORAL_TABLET | Freq: Once | ORAL | Status: AC
Start: 2016-05-01 — End: 2016-05-01
  Administered 2016-05-01: 100 mg via ORAL
  Filled 2016-05-01: qty 1

## 2016-05-01 MED ORDER — IPRATROPIUM-ALBUTEROL 0.5-2.5 (3) MG/3ML IN SOLN
9.0000 mL | Freq: Once | RESPIRATORY_TRACT | Status: AC
  Administered 2016-05-01: 9 mL via RESPIRATORY_TRACT
  Filled 2016-05-01: qty 9

## 2016-05-01 MED ORDER — LEVOTHYROXINE SODIUM 100 MCG PO TABS
100.0000 ug | ORAL_TABLET | Freq: Every day | ORAL | Status: DC
  Administered 2016-05-02 – 2016-05-04 (×3): 100 ug via ORAL
  Filled 2016-05-01 (×3): qty 1

## 2016-05-01 MED ORDER — METHYLPREDNISOLONE SODIUM SUCC 125 MG IJ SOLR
60.0000 mg | Freq: Two times a day (BID) | INTRAMUSCULAR | Status: DC
  Administered 2016-05-02: 60 mg via INTRAVENOUS
  Filled 2016-05-01: qty 2

## 2016-05-01 MED ORDER — DOXYCYCLINE HYCLATE 100 MG PO TABS
100.0000 mg | ORAL_TABLET | Freq: Two times a day (BID) | ORAL | Status: DC
  Administered 2016-05-02 – 2016-05-04 (×5): 100 mg via ORAL
  Filled 2016-05-01 (×5): qty 1

## 2016-05-01 MED ORDER — INFLUENZA VAC SPLIT QUAD 0.5 ML IM SUSY
0.5000 mL | PREFILLED_SYRINGE | INTRAMUSCULAR | Status: AC
  Administered 2016-05-02: 0.5 mL via INTRAMUSCULAR
  Filled 2016-05-01: qty 0.5

## 2016-05-01 MED ORDER — SODIUM CHLORIDE 0.9 % IV BOLUS (SEPSIS)
1000.0000 mL | Freq: Once | INTRAVENOUS | Status: AC
  Administered 2016-05-01: 1000 mL via INTRAVENOUS

## 2016-05-01 MED ORDER — ENOXAPARIN SODIUM 40 MG/0.4ML ~~LOC~~ SOLN
40.0000 mg | SUBCUTANEOUS | Status: DC
  Administered 2016-05-01 – 2016-05-03 (×3): 40 mg via SUBCUTANEOUS
  Filled 2016-05-01 (×3): qty 0.4

## 2016-05-01 MED ORDER — THYROID 30 MG PO TABS
30.0000 mg | ORAL_TABLET | ORAL | Status: DC
  Administered 2016-05-02 – 2016-05-04 (×3): 30 mg via ORAL
  Filled 2016-05-01 (×3): qty 1

## 2016-05-01 MED ORDER — ALBUTEROL SULFATE (2.5 MG/3ML) 0.083% IN NEBU: 2.5000 mg | INHALATION_SOLUTION | RESPIRATORY_TRACT | Status: DC | PRN

## 2016-05-01 MED ORDER — ACETAMINOPHEN 650 MG RE SUPP: 650.0000 mg | Freq: Four times a day (QID) | RECTAL | Status: DC | PRN

## 2016-05-01 MED ORDER — IBUPROFEN 200 MG PO TABS: 400.0000 mg | ORAL_TABLET | Freq: Four times a day (QID) | ORAL | Status: DC | PRN

## 2016-05-01 MED ORDER — SODIUM CHLORIDE 0.9% FLUSH: 3.0000 mL | INTRAVENOUS | Status: DC | PRN

## 2016-05-01 MED ORDER — ACETAMINOPHEN 325 MG PO TABS
650.0000 mg | ORAL_TABLET | Freq: Four times a day (QID) | ORAL | Status: DC | PRN
  Administered 2016-05-01 – 2016-05-04 (×4): 650 mg via ORAL
  Filled 2016-05-01 (×4): qty 2

## 2016-05-01 MED ORDER — IPRATROPIUM-ALBUTEROL 0.5-2.5 (3) MG/3ML IN SOLN
3.0000 mL | RESPIRATORY_TRACT | Status: DC
  Administered 2016-05-01 – 2016-05-03 (×9): 3 mL via RESPIRATORY_TRACT
  Filled 2016-05-01 (×12): qty 3

## 2016-05-01 MED ORDER — POLYETHYLENE GLYCOL 3350 17 G PO PACK: 17.0000 g | PACK | Freq: Every day | ORAL | Status: DC | PRN

## 2016-05-01 MED ORDER — SODIUM CHLORIDE 0.9% FLUSH
3.0000 mL | Freq: Two times a day (BID) | INTRAVENOUS | Status: DC
  Administered 2016-05-01 – 2016-05-03 (×5): 3 mL via INTRAVENOUS

## 2016-05-01 MED ORDER — METHYLPREDNISOLONE SODIUM SUCC 125 MG IJ SOLR
125.0000 mg | Freq: Once | INTRAMUSCULAR | Status: AC
  Administered 2016-05-01: 125 mg via INTRAVENOUS
  Filled 2016-05-01: qty 2

## 2016-05-01 NOTE — Plan of Care (Signed)
Problem: Education: Goal: Knowledge of [facility] Education information/materials will improve Outcome: Progressing Pt likes to be called Joanna Ortiz  Past Medical History:  Diagnosis Date  . Crohn disease ([place])   . Thyroid disease    Pt is well controlled with home medications

## 2016-05-01 NOTE — ED Triage Notes (Signed)
First Nurse Note:  Seen by [facility] today for c/o cough.  WBC:  18 and Pulse 120's.  Sent to ED for evaluation of possible pneumonia.  AAOx3.  Skin warm and dry. No SOB/ DOE.  Ambulates with easy and steady gait.

## 2016-05-01 NOTE — H&P (Signed)
[place] [facility] [place] NAME: Joanna Ortiz    MR#:  [id number]  DATE OF BIRTH:  12/26/1945  DATE OF ADMISSION:  05/01/2016  PRIMARY CARE PHYSICIAN: Sharyne Peach, MD   REQUESTING/REFERRING PHYSICIAN: Dr. Lucita Lora  CHIEF COMPLAINT:   Chief Complaint  Patient presents with  . Fever  . Cough    HISTORY OF PRESENT ILLNESS:  Joanna Ortiz  is a 71 y.o. female with a known history of crohns disease, hypothyroid here with Wheezing and shortness of breath. Patient was started on prednisone and Levaquin 8 days back by her primary care physician. She did not improve and then back to her physician and was sent to the emergency room due to tachycardia and sick if it and wheezing and decreased air entry. Patient had pneumonia in January. She recovered completely with distal 10 days back when she started coughing. Her daughter had a cold recently. Patient did not have any lung problems prior to January. Does not smoke. Here she has been tachycardic 8 from 110-120. Afebrile. Chest x-ray shows no pneumonia. W BC count 10.7.  PAST MEDICAL HISTORY:   Past Medical History:  Diagnosis Date  . Crohn disease ([place])   . Thyroid disease     PAST SURGICAL HISTORY:   Past Surgical History:  Procedure Laterality Date  . ABDOMINAL HYSTERECTOMY  1987  . APPENDECTOMY    . BREAST BIOPSY Bilateral    neg  . COLON SURGERY  06/06/2011   Resection of terminal ileum and previous ileocolonic anastomosis for recurrent Crohn's disease. 3 focal strictures identified.  . COLONOSCOPY  2013  . SMALL INTESTINE SURGERY  2012  . THYROID SURGERY  1992    SOCIAL HISTORY:   Social History  Substance Use Topics  . Smoking status: Former Smoker    Packs/day: 1.00    Years: 12.00    Types: Cigarettes  . Smokeless tobacco: Never Used  . Alcohol use No    FAMILY HISTORY:   Family History  Problem Relation Age of Onset  . Breast cancer Sister 38    DRUG  ALLERGIES:   Allergies  Allergen Reactions  . Erythromycin Hives  . Hydromorphone Hives  . Cefuroxime Nausea And Vomiting  . Ciprocinonide [Fluocinolone]     Muscle pain   . Codeine     Other reaction(s): Vomiting  . Floxin [Ofloxacin] Nausea And Vomiting  . Keflex [Cephalexin] Nausea And Vomiting  . Levofloxacin Nausea And Vomiting  . Sulfa Antibiotics Nausea And Vomiting  . Ciprofloxacin Nausea And Vomiting    REVIEW OF SYSTEMS:   Review of Systems  Constitutional: Positive for malaise/fatigue. Negative for chills and fever.  HENT: Negative for sore throat.   Eyes: Negative for blurred vision, double vision and pain.  Respiratory: Positive for cough, shortness of breath and wheezing. Negative for hemoptysis.   Cardiovascular: Negative for chest pain, palpitations, orthopnea and leg swelling.  Gastrointestinal: Negative for abdominal pain, constipation, diarrhea, heartburn, nausea and vomiting.  Genitourinary: Negative for dysuria and hematuria.  Musculoskeletal: Negative for back pain and joint pain.  Skin: Negative for rash.  Neurological: Positive for weakness. Negative for sensory change, speech change, focal weakness and headaches.  Endo/Heme/Allergies: Does not bruise/bleed easily.  Psychiatric/Behavioral: Negative for depression. The patient is not nervous/anxious.     MEDICATIONS AT HOME:   Prior to Admission medications   Medication Sig Start Date End Date Taking? Authorizing Provider  Acetaminophen 500 MG coapsule Take 1,000 mg by mouth  3 (three) times daily as needed for fever.   Yes Historical Provider, MD  cholestyramine Lucrezia Starch) 4 G packet Take 4 g by mouth 2 (two) times daily as needed (stomach problems).    Yes Historical Provider, MD  colestipol (COLESTID) 1 G tablet Take 1 g by mouth daily as needed (for diarrhea).    Yes Historical Provider, MD  Multiple Vitamin (MULTIVITAMIN) tablet Take 1 tablet by mouth daily.   Yes Historical Provider, MD  Omega-3  Fatty Acids (FISH OIL) 1000 MG CAPS Take 1,000 mg by mouth daily.    Yes Historical Provider, MD  SYNTHROID 100 MCG tablet Take 100 mcg by mouth daily before breakfast.  04/27/14  Yes Historical Provider, MD  thyroid (ARMOUR) 30 MG tablet Take 30 mg by mouth every morning.   Yes Historical Provider, MD  TURMERIC CURCUMIN PO Take 1 capsule by mouth daily.   Yes Historical Provider, MD  cyanocobalamin (,VITAMIN B-12,) 1000 MCG/ML injection Inject 1,000 mcg into the muscle every 30 (thirty) days.    Historical Provider, MD  HUMIRA PEN 40 MG/0.8ML PNKT Inject 40 mg into the skin every 14 (fourteen) days.  09/08/13   Historical Provider, MD  nitrofurantoin, macrocrystal-monohydrate, (MACROBID) 100 MG capsule Take 1 capsule (100 mg total) by mouth 2 (two) times daily. Patient not taking: Reported on 05/01/2016 01/07/15   Paulina Fusi, MD  traMADol (ULTRAM) 50 MG tablet Take 1 tablet (50 mg total) by mouth every 6 (six) hours as needed. Patient not taking: Reported on 05/01/2016 03/12/16   Daymon Larsen, MD     VITAL SIGNS:  Blood pressure (!) 151/108, pulse (!) 117, temperature 98.5 F (36.9 C), temperature source Oral, resp. rate 20, height 5\' 8"  (1.727 m), weight 81.6 kg (180 lb), SpO2 95 %.  PHYSICAL EXAMINATION:  Physical Exam  GENERAL:  71 y.o.-year-old patient lying in the bed with Conversational dyspnea EYES: Pupils equal, round, reactive to light and accommodation. No scleral icterus. Extraocular muscles intact.  HEENT: Head atraumatic, normocephalic. Oropharynx and nasopharynx clear. No oropharyngeal erythema, moist oral mucosa  NECK:  Supple, no jugular venous distention. No thyroid enlargement, no tenderness.  LUNGS: Lateral wheezing CARDIOVASCULAR: S1, S2 normal. No murmurs, rubs, or gallops.  ABDOMEN: Soft, nontender, nondistended. Bowel sounds present. No organomegaly or mass.  EXTREMITIES: No pedal edema, cyanosis, or clubbing. + 2 pedal & radial pulses b/l.   NEUROLOGIC: Cranial  nerves II through XII are intact. No focal Motor or sensory deficits appreciated b/l PSYCHIATRIC: The patient is alert and oriented x 3. Good affect.  SKIN: No obvious rash, lesion, or ulcer.   LABORATORY PANEL:   CBC No results for input(s): WBC, HGB, HCT, PLT in the last 168 hours. ------------------------------------------------------------------------------------------------------------------  Chemistries  No results for input(s): NA, K, CL, CO2, GLUCOSE, BUN, CREATININE, CALCIUM, MG, AST, ALT, ALKPHOS, BILITOT in the last 168 hours.  Invalid input(s): GFRCGP ------------------------------------------------------------------------------------------------------------------  Cardiac Enzymes No results for input(s): TROPONINI in the last 168 hours. ------------------------------------------------------------------------------------------------------------------  RADIOLOGY:  Dg Chest 2 View  Result Date: 05/01/2016 CLINICAL DATA:  Cough, congestion and fever. EXAM: CHEST  2 VIEW COMPARISON:  Chest radiograph 09/06/2014 FINDINGS: Normal cardiomediastinal contours. Mild hyperinflation and generalized increased pulmonary markings. No focal airspace consolidation or pulmonary edema. No pneumothorax or pleural effusion. Status post left shoulder hemiarthroplasty. IMPRESSION: No active cardiopulmonary disease. Electronically Signed   By: Ulyses Jarred M.D.   On: 05/01/2016 14:24     IMPRESSION AND PLAN:   * Acute bronchitis -IV steroids,  Antibiotics - Scheduled Nebulizers - Consult pulmonary if no improvement - Failed outpatient treatment Check influenza PCR  * Hypothyroidism and Crohn's disease. Continue home medications.  * DVT prophylaxis with Lovenox   All the records are reviewed and case discussed with ED provider. Management plans discussed with the patient, family and they are in agreement.  CODE STATUS: Full code  TOTAL TIME TAKING CARE OF THIS PATIENT: 40 minutes.    Hillary Bow R M.D on 05/01/2016 at 6:04 PM  Between 7am to 6pm - Pager - [phone]  After 6pm go to [email or web address] - password [email or web address] [facility]  Office  [phone]  CC: Primary care physician; Sharyne Peach, MD  Note: This dictation was prepared with Dragon dictation along with smaller phrase technology. Any transcriptional errors that result from this process are unintentional.

## 2016-05-01 NOTE — ED Provider Notes (Signed)
[facility] Emergency Department Provider Note  ____________________________________________   First MD Initiated Contact with Patient 05/01/16 1541     (approximate)  I have reviewed the triage vital signs and the nursing notes.   HISTORY  Chief Complaint Fever and Cough   HPI Joanna Ortiz is a 71 y.o. female with history of Crohn's disease was present to emergency department with 2 weeks of cough and shortness of breath. She was seen at her primary care doctor's earlier this morning and found to have persistent wheezing. On March 7 the patient was placed on prednisone as well as albuterol and Tessalon. However, the patient continued to be symptomatic. She presented to her doctor's this morning because of a frontal headache and was found to be tachycardic and with a white count of 18,000. The patient says that the headache is frontal and mild to moderate. She says that it is associated with nasal congestion and pain also to her face. She is denying any fevers now over the past several days. Says that she has been noted to be subtly tachycardiac the last several times she has been seen in her doctor's office. Denies any chest pain. However, she says that she has been short of breath with ambulation. Denies any nausea vomiting or diarrhea. Patient said that she one nebulizer treatment this morning with only minimal improvement. She says that she also is using albuterol inhaler every 6 hours at home.  Because of the wheezing and the white count found at her outpatient visit this morning she was sent to the emergency department for further evaluation for concern for pneumonia. Patient was also prescribed another course of steroids today as well as Levaquin but has not taken them.   Past Medical History:  Diagnosis Date  . Crohn disease ([place])   . Thyroid disease     Patient Active Problem List   Diagnosis Date Noted  . Diastasis recti 10/29/2015  . Rectal  bleeding 06/10/2014  . AP (abdominal pain) 12/29/2013  . Crohn's disease ([place]) 12/18/2013    Past Surgical History:  Procedure Laterality Date  . ABDOMINAL HYSTERECTOMY  1987  . APPENDECTOMY    . BREAST BIOPSY Bilateral    neg  . COLON SURGERY  06/06/2011   Resection of terminal ileum and previous ileocolonic anastomosis for recurrent Crohn's disease. 3 focal strictures identified.  . COLONOSCOPY  2013  . SMALL INTESTINE SURGERY  2012  . THYROID SURGERY  1992    Prior to Admission medications   Medication Sig Start Date End Date Taking? Authorizing Provider  Acetaminophen 500 MG coapsule Take 1,000 mg by mouth 3 (three) times daily as needed for fever.   Yes Historical Provider, MD  cholestyramine Lucrezia Starch) 4 G packet Take 4 g by mouth 2 (two) times daily as needed (stomach problems).    Yes Historical Provider, MD  colestipol (COLESTID) 1 G tablet Take 1 g by mouth daily as needed (for diarrhea).    Yes Historical Provider, MD  Multiple Vitamin (MULTIVITAMIN) tablet Take 1 tablet by mouth daily.   Yes Historical Provider, MD  Omega-3 Fatty Acids (FISH OIL) 1000 MG CAPS Take 1,000 mg by mouth daily.    Yes Historical Provider, MD  SYNTHROID 100 MCG tablet Take 100 mcg by mouth daily before breakfast.  04/27/14  Yes Historical Provider, MD  thyroid (ARMOUR) 30 MG tablet Take 30 mg by mouth every morning.   Yes Historical Provider, MD  TURMERIC CURCUMIN PO Take 1 capsule  by mouth daily.   Yes Historical Provider, MD  cyanocobalamin (,VITAMIN B-12,) 1000 MCG/ML injection Inject 1,000 mcg into the muscle every 30 (thirty) days.    Historical Provider, MD  HUMIRA PEN 40 MG/0.8ML PNKT Inject 40 mg into the skin every 14 (fourteen) days.  09/08/13   Historical Provider, MD  nitrofurantoin, macrocrystal-monohydrate, (MACROBID) 100 MG capsule Take 1 capsule (100 mg total) by mouth 2 (two) times daily. Patient not taking: Reported on 05/01/2016 01/07/15   Paulina Fusi, MD  traMADol (ULTRAM) 50 MG  tablet Take 1 tablet (50 mg total) by mouth every 6 (six) hours as needed. Patient not taking: Reported on 05/01/2016 03/12/16   Daymon Larsen, MD    Allergies Erythromycin; Hydromorphone; Cefuroxime; Ciprocinonide [fluocinolone]; Codeine; Floxin [ofloxacin]; Keflex [cephalexin]; Levofloxacin; Sulfa antibiotics; and Ciprofloxacin  Family History  Problem Relation Age of Onset  . Breast cancer Sister 48    Social History Social History  Substance Use Topics  . Smoking status: Former Smoker    Packs/day: 1.00    Years: 12.00    Types: Cigarettes  . Smokeless tobacco: Never Used  . Alcohol use No    Review of Systems Constitutional: No fever/chills Eyes: No visual changes. ENT: No sore throat. Cardiovascular: Denies chest pain. Respiratory: asa bove Gastrointestinal: No abdominal pain.  No nausea, no vomiting.  No diarrhea.  No constipation. Genitourinary: Negative for dysuria. Musculoskeletal: Negative for back pain. Skin: Negative for rash. Neurological: Negative for  focal weakness or numbness.  10-point ROS otherwise negative.  ____________________________________________   PHYSICAL EXAM:  VITAL SIGNS: ED Triage Vitals [05/01/16 1351]  Enc Vitals Group     BP (!) 151/108     Pulse Rate (!) 117     Resp 20     Temp 98.5 F (36.9 C)     Temp Source Oral     SpO2 95 %     Weight 180 lb (81.6 kg)     Height 5\' 8"  (1.727 m)     Head Circumference      Peak Flow      Pain Score 3     Pain Loc      Pain Edu?      Excl. in [place]?     Constitutional: Alert and oriented. Well appearing and in no acute distress. Eyes: Conjunctivae are normal. PERRL. EOMI. Head: Atraumatic. Nose: No congestion/rhinnorhea. Mouth/Throat: Mucous membranes are moist.   Neck: No stridor.   Cardiovascular: Normal rate, regular rhythm. Heart rate of 90 bpm in the room. Grossly normal heart sounds.   Respiratory: Normal respiratory effort.  Expiratory cough with diffuse wheezing.  Moderate air movement in all fields. Gastrointestinal: Soft and nontender. No distention.  Musculoskeletal: No lower extremity tenderness nor edema.  No joint effusions. Neurologic:  Normal speech and language. No gross focal neurologic deficits are appreciated.  Skin:  Skin is warm, dry and intact. No rash noted. Psychiatric: Mood and affect are normal. Speech and behavior are normal.  ____________________________________________   LABS (all labs ordered are listed, but only abnormal results are displayed)  Labs Reviewed - No data to display ____________________________________________  EKG  ED ECG REPORT I, Devaney Segers,  Youlanda Roys, the attending physician, personally viewed and interpreted this ECG.   Date: 05/01/2016  EKG Time: 1355  Rate: 121  Rhythm: sinus tachycardia  Axis: Normal  Intervals:left anterior fascicular block  ST&T Change: No ST segment elevation or depression. No abnormal T-wave inversion.  ____________________________________________  RADIOLOGY  DG Chest  2 View (Final result)  Result time 05/01/16 14:24:19  Final result by Bella Kennedy, MD (05/01/16 14:24:19)           Narrative:   CLINICAL DATA: Cough, congestion and fever.  EXAM: CHEST 2 VIEW  COMPARISON: Chest radiograph 09/06/2014  FINDINGS: Normal cardiomediastinal contours. Mild hyperinflation and generalized increased pulmonary markings. No focal airspace consolidation or pulmonary edema. No pneumothorax or pleural effusion. Status post left shoulder hemiarthroplasty.  IMPRESSION: No active cardiopulmonary disease.   Electronically Signed By: Ulyses Jarred M.D. On: 05/01/2016 14:24            ____________________________________________   PROCEDURES  Procedure(s) performed:   Procedures  Critical Care performed:   ____________________________________________   INITIAL IMPRESSION / ASSESSMENT AND PLAN / ED COURSE  Pertinent labs & imaging results that  were available during my care of the patient were reviewed by me and considered in my medical decision making (see chart for details).  We will give the patient a dose of doxycycline. Also to give nebs as well as IV Solu-Medrol and Medrol. To reassess the patient. Possible need for admission because of failure of outpatient treatment.    ----------------------------------------- 5:32 PM on 05/01/2016 -----------------------------------------  Patient persistently wheezing with expiratory cough after nebs and steroids. Will be admitted to the hospital. Signed out to Dr. Darvin Neighbours. Patient is understanding of this plan and willing to comply.  ____________________________________________   FINAL CLINICAL IMPRESSION(S) / ED DIAGNOSES  Bronchospasm.    NEW MEDICATIONS STARTED DURING THIS VISIT:  New Prescriptions   No medications on file     Note:  This document was prepared using Dragon voice recognition software and may include unintentional dictation errors.    Orbie Pyo, MD 05/01/16 [phone]

## 2016-05-01 NOTE — ED Triage Notes (Signed)
Pt to ed with c/o cough, congestion and fever, sent from [facility] for increased wbc, and tachycardia.

## 2016-05-02 LAB — GLUCOSE, CAPILLARY
GLUCOSE-CAPILLARY: 450 mg/dL — AB (ref 65–99)
GLUCOSE-CAPILLARY: 363 mg/dL — AB (ref 65–99)
GLUCOSE-CAPILLARY: 154 mg/dL — AB (ref 65–99)
Glucose-Capillary: 543 mg/dL (ref 65–99)
Glucose-Capillary: 478 mg/dL — ABNORMAL HIGH (ref 65–99)
Glucose-Capillary: 184 mg/dL — ABNORMAL HIGH (ref 65–99)

## 2016-05-02 LAB — BASIC METABOLIC PANEL
ANION GAP: 10 (ref 5–15)
BUN: 14 mg/dL (ref 6–20)
CALCIUM: 7.7 mg/dL — AB (ref 8.9–10.3)
CO2: 25 mmol/L (ref 22–32)
CREATININE: 0.85 mg/dL (ref 0.44–1.00)
Chloride: 100 mmol/L — ABNORMAL LOW (ref 101–111)
Glucose, Bld: 484 mg/dL — ABNORMAL HIGH (ref 65–99)
Potassium: 3.7 mmol/L (ref 3.5–5.1)
SODIUM: 135 mmol/L (ref 135–145)

## 2016-05-02 LAB — CBC
HCT: 30 % — ABNORMAL LOW (ref 35.0–47.0)
Hemoglobin: 10.1 g/dL — ABNORMAL LOW (ref 12.0–16.0)
MCH: 28.4 pg (ref 26.0–34.0)
MCHC: 33.6 g/dL (ref 32.0–36.0)
MCV: 84.6 fL (ref 80.0–100.0)
PLATELETS: 213 10*3/uL (ref 150–440)
RBC: 3.55 MIL/uL — AB (ref 3.80–5.20)
RDW: 16.5 % — ABNORMAL HIGH (ref 11.5–14.5)
WBC: 13.7 10*3/uL — ABNORMAL HIGH (ref 3.6–11.0)

## 2016-05-02 MED ORDER — INSULIN ASPART 100 UNIT/ML ~~LOC~~ SOLN: 0.0000 [IU] | Freq: Every day | SUBCUTANEOUS | Status: DC

## 2016-05-02 MED ORDER — INSULIN GLARGINE 100 UNIT/ML ~~LOC~~ SOLN
8.0000 [IU] | Freq: Once | SUBCUTANEOUS | Status: AC
Start: 2016-05-02 — End: 2016-05-02
  Administered 2016-05-02: 8 [IU] via SUBCUTANEOUS
  Filled 2016-05-02: qty 0.08

## 2016-05-02 MED ORDER — INSULIN ASPART 100 UNIT/ML ~~LOC~~ SOLN
0.0000 [IU] | Freq: Three times a day (TID) | SUBCUTANEOUS | Status: DC
Start: 2016-05-02 — End: 2016-05-02

## 2016-05-02 MED ORDER — INSULIN ASPART 100 UNIT/ML ~~LOC~~ SOLN
0.0000 [IU] | Freq: Three times a day (TID) | SUBCUTANEOUS | Status: DC
  Administered 2016-05-02: 17:00:00 3 [IU] via SUBCUTANEOUS
  Administered 2016-05-02: 14:00:00 15 [IU] via SUBCUTANEOUS
  Administered 2016-05-03: 8 [IU] via SUBCUTANEOUS
  Administered 2016-05-03: 11 [IU] via SUBCUTANEOUS
  Administered 2016-05-03: 08:00:00 2 [IU] via SUBCUTANEOUS
  Administered 2016-05-04: 5 [IU] via SUBCUTANEOUS
  Filled 2016-05-02: qty 2
  Filled 2016-05-02: qty 15
  Filled 2016-05-02: qty 8
  Filled 2016-05-02: qty 11
  Filled 2016-05-02: qty 5
  Filled 2016-05-02: qty 2

## 2016-05-02 MED ORDER — SODIUM CHLORIDE 0.9 % IV BOLUS (SEPSIS)
1000.0000 mL | Freq: Once | INTRAVENOUS | Status: AC
  Administered 2016-05-02: 1000 mL via INTRAVENOUS

## 2016-05-02 MED ORDER — PREDNISONE 50 MG PO TABS
50.0000 mg | ORAL_TABLET | Freq: Every day | ORAL | Status: DC
  Administered 2016-05-03 – 2016-05-04 (×2): 50 mg via ORAL
  Filled 2016-05-02 (×2): qty 1

## 2016-05-02 MED ORDER — INSULIN ASPART 100 UNIT/ML ~~LOC~~ SOLN
0.0000 [IU] | Freq: Every day | SUBCUTANEOUS | Status: DC
  Administered 2016-05-03: 2 [IU] via SUBCUTANEOUS
  Filled 2016-05-02: qty 2

## 2016-05-02 MED ORDER — INSULIN ASPART 100 UNIT/ML ~~LOC~~ SOLN
18.0000 [IU] | Freq: Once | SUBCUTANEOUS | Status: AC
  Administered 2016-05-02: 18 [IU] via SUBCUTANEOUS
  Filled 2016-05-02: qty 18

## 2016-05-02 NOTE — Plan of Care (Signed)
BS 543 - Dr. Informed

## 2016-05-02 NOTE — Discharge Planning (Signed)
Educated on sliding scale insulin administration and when BS is too low to give insulin. Difference bwtn fast acting and long acting insulin.  What to do if BS is low (under 60).  S/sx of high and low BS.  Patient also encouraged to get referral to endocrinologist.  Patient understands, based on A1C results, she may be sent home on insulin.  We are preparing her for that possibility.

## 2016-05-02 NOTE — Plan of Care (Addendum)
Patient still BS 478 30 minutes after 18U Novolg given.  Dr. Curly Rim to inform and request further orders. Dr. Annie Main page and instructed NOT to administer sliding scale at this time, recheck BS in 1 hr/re-assess/ and inform.  RN will continue to monitor.

## 2016-05-02 NOTE — Progress Notes (Signed)
[place] [place] NAME: Joanna Ortiz    MR#:  [id number]  DATE OF BIRTH:  1945/09/19  SUBJECTIVE:  CHIEF COMPLAINT:  Shortness of breath and wheezing are better still coughing  REVIEW OF SYSTEMS:  CONSTITUTIONAL: No fever, fatigue or weakness.  EYES: No blurred or double vision.  EARS, NOSE, AND THROAT: No tinnitus or ear pain.  RESPIRATORY: Still reporting cough, slightly improved shortness of breath, some wheezing , denies hemoptysis.  CARDIOVASCULAR: No chest pain, orthopnea, edema.  GASTROINTESTINAL: No nausea, vomiting, diarrhea or abdominal pain.  GENITOURINARY: No dysuria, hematuria.  ENDOCRINE: No polyuria, nocturia,  HEMATOLOGY: No anemia, easy bruising or bleeding SKIN: No rash or lesion. MUSCULOSKELETAL: No joint pain or arthritis.   NEUROLOGIC: No tingling, numbness, weakness.  PSYCHIATRY: No anxiety or depression.   DRUG ALLERGIES:   Allergies  Allergen Reactions  . Erythromycin Hives  . Hydromorphone Hives  . Cefuroxime Nausea And Vomiting  . Ciprocinonide [Fluocinolone]     Muscle pain   . Codeine     Other reaction(s): Vomiting  . Floxin [Ofloxacin] Nausea And Vomiting  . Keflex [Cephalexin] Nausea And Vomiting  . Levofloxacin Nausea And Vomiting  . Sulfa Antibiotics Nausea And Vomiting  . Ciprofloxacin Nausea And Vomiting    VITALS:  Blood pressure (!) 141/69, pulse 94, temperature 98.3 F (36.8 C), temperature source Oral, resp. rate 18, height 5\' 8"  (1.727 m), weight 80.4 kg (177 lb 4.8 oz), SpO2 94 %.  PHYSICAL EXAMINATION:  GENERAL:  71 y.o.-year-old patient lying in the bed with no acute distress.  EYES: Pupils equal, round, reactive to light and accommodation. No scleral icterus. Extraocular muscles intact.  HEENT: Head atraumatic, normocephalic. Oropharynx and nasopharynx clear.  NECK:  Supple, no jugular venous distention. No thyroid enlargement, no tenderness.  LUNGS:Coarse bronchial  breath sounds bilaterally, minimal end expiratory wheezing, no rales,rhonchi or crepitation. No use of accessory muscles of respiration.  CARDIOVASCULAR: S1, S2 normal. No murmurs, rubs, or gallops.  ABDOMEN: Soft, nontender, nondistended. Bowel sounds present. No organomegaly or mass.  EXTREMITIES: No pedal edema, cyanosis, or clubbing.  NEUROLOGIC: Cranial nerves II through XII are intact. Muscle strength 5/5 in all extremities. Sensation intact. Gait not checked.  PSYCHIATRIC: The patient is alert and oriented x 3.  SKIN: No obvious rash, lesion, or ulcer.    LABORATORY PANEL:   CBC  Recent Labs Lab 05/02/16 0513  WBC 13.7*  HGB 10.1*  HCT 30.0*  PLT 213   ------------------------------------------------------------------------------------------------------------------  Chemistries   Recent Labs Lab 05/02/16 0513  NA 135  K 3.7  CL 100*  CO2 25  GLUCOSE 484*  BUN 14  CREATININE 0.85  CALCIUM 7.7*   ------------------------------------------------------------------------------------------------------------------  Cardiac Enzymes No results for input(s): TROPONINI in the last 168 hours. ------------------------------------------------------------------------------------------------------------------  RADIOLOGY:  Dg Chest 2 View  Result Date: 05/01/2016 CLINICAL DATA:  Cough, congestion and fever. EXAM: CHEST  2 VIEW COMPARISON:  Chest radiograph 09/06/2014 FINDINGS: Normal cardiomediastinal contours. Mild hyperinflation and generalized increased pulmonary markings. No focal airspace consolidation or pulmonary edema. No pneumothorax or pleural effusion. Status post left shoulder hemiarthroplasty. IMPRESSION: No active cardiopulmonary disease. Electronically Signed   By: Ulyses Jarred M.D.   On: 05/01/2016 14:24    EKG:   Orders placed or performed during the hospital encounter of 05/01/16  . ED EKG  . ED EKG    ASSESSMENT AND PLAN:     * Acute  bronchitis -Clinically improving -Taper IV steroids  to by mouth prednisone, continue antibiotics - Scheduled Nebulizers - Consult pulmonary if no improvement - Failed outpatient treatment Flu test is negative  Steroid induced hyperglycemia Lantus 8 units subcutaneous 1 was given today. Moderate dose sliding scale insulin Check hemoglobin A1c   * 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 Continue home medications.  * DVT prophylaxis with Lovenox   All the records are reviewed and case discussed with Care Management/Social Workerr. Management plans discussed with the patient, family and they are in agreement.  CODE STATUS: fc   TOTAL TIME TAKING CARE OF THIS PATIENT: 35  minutes.   POSSIBLE D/C IN 1-2  DAYS, DEPENDING ON CLINICAL CONDITION.  Note: This dictation was prepared with Dragon dictation along with smaller phrase technology. Any transcriptional errors that result from this process are unintentional.   Nicholes Mango M.D on 05/02/2016 at 4:42 PM  Between 7am to 6pm - Pager - [phone] After 6pm go to [email or web address] - password [email or web address] [facility]  Office  [phone]  CC: Primary care physician; Sharyne Peach, MD

## 2016-05-02 NOTE — Progress Notes (Signed)
Inpatient Diabetes Program Recommendations  AACE/ADA: New Consensus Statement on Inpatient Glycemic Control (2015)  Target Ranges:  Prepandial:   less than 140 mg/dL      Peak postprandial:   less than 180 mg/dL (1-2 hours)      Critically ill patients:  140 - 180 mg/dL   Results for FLORA, PARKS (MRN [id number]) as of 05/02/2016 08:49  Ref. Range 05/02/2016 05:13  Glucose Latest Ref Range: 65 - 99 mg/dL 484 (H)   Review of Glycemic Control  Diabetes history: No Outpatient Diabetes medications: NA Current orders for Inpatient glycemic control: None  Inpatient Diabetes Program Recommendations: Insulin - Basal: Please consider ordering a one time order of Lantus 8 units x 1 now (based on 80 kg x 0.1 units). Correction (SSI): Lab glucose 484 mg/dl this morning at 5:13 am. While inpatient and ordered steroids, please consider ordering Novolog 0-15 units TID with meals and Novolog 0-5 units QHS for bedtime correction scale. HgbA1C: Please add an A1C to blood in lab to evaluate glycemic control over the past 2-3 months.  NOTE: Patient does not have a history of diabetes and noted lab glucose of 484 mg/dl this morning. In H&P, also noted patient has been on Prednisone over the past week; currently ordered Solumedrol 60 mg Q12H which is contributing to hyperglycemia.    Thanks, Barnie Alderman, RN, MSN, CDE Diabetes Coordinator Inpatient Diabetes Program [phone] (Team Pager from 8am to 5pm)

## 2016-05-02 NOTE — Plan of Care (Signed)
Re-checked BS after 15U novolog - BS 363. RN Will continue to monitor. Pt just shaky and weak,no other s/sx at this time. Dr. Text to inform.

## 2016-05-02 NOTE — Plan of Care (Addendum)
Patient 1hr re-check BS was 450.  Dr. Curly Rim to inform and request further orders. Dr. Annie Main page, gave verbal to admit full sliding scale (15U of novolog).  Will re-check in 1 hr and re-assess.

## 2016-05-03 LAB — GLUCOSE, CAPILLARY
GLUCOSE-CAPILLARY: 134 mg/dL — AB (ref 65–99)
Glucose-Capillary: 334 mg/dL — ABNORMAL HIGH (ref 65–99)
Glucose-Capillary: 272 mg/dL — ABNORMAL HIGH (ref 65–99)
Glucose-Capillary: 227 mg/dL — ABNORMAL HIGH (ref 65–99)

## 2016-05-03 MED ORDER — IPRATROPIUM-ALBUTEROL 0.5-2.5 (3) MG/3ML IN SOLN
3.0000 mL | Freq: Three times a day (TID) | RESPIRATORY_TRACT | Status: DC
  Administered 2016-05-03 – 2016-05-04 (×2): 3 mL via RESPIRATORY_TRACT
  Filled 2016-05-03 (×3): qty 3

## 2016-05-03 NOTE — Progress Notes (Signed)
Inpatient Diabetes Program Recommendations  AACE/ADA: New Consensus Statement on Inpatient Glycemic Control (2015)  Target Ranges:  Prepandial:   less than 140 mg/dL      Peak postprandial:   less than 180 mg/dL (1-2 hours)      Critically ill patients:  140 - 180 mg/dL   Lab Results  Component Value Date   GLUCAP 134 (H) 05/03/2016    Spoke to patient and husband about A1C. (awaiting results) Patient tells me her MD has told her she is borderline.  Discussed A1C norms and the affect steroids have on the blood sugar.  Patient showed me the menu and has been trying to keep her carb count around 45 grams.    Order for dietitian placed for general dietary guidelines for pre-diabetes.    Gentry Fitz, RN, BA, MHA, CDE Diabetes Coordinator Inpatient Diabetes Program  [phone] (Team Pager) [phone] ([place]) 05/03/2016 11:16 AM

## 2016-05-03 NOTE — Progress Notes (Signed)
Inpatient Diabetes Program Recommendations  AACE/ADA: New Consensus Statement on Inpatient Glycemic Control (2015)  Target Ranges:  Prepandial:   less than 140 mg/dL      Peak postprandial:   less than 180 mg/dL (1-2 hours)      Critically ill patients:  140 - 180 mg/dL   Lab Results  Component Value Date   GLUCAP 134 (H) 05/03/2016    Review of Glycemic Control  Results for Joanna Ortiz, Joanna Ortiz (MRN [id number]) as of 05/03/2016 09:58  Ref. Range 05/02/2016 14:27 05/02/2016 16:58 05/02/2016 19:52 05/03/2016 07:37  Glucose-Capillary Latest Ref Range: 65 - 99 mg/dL 363 (H) 154 (H) 184 (H) 134 (H)    Diabetes history: No Outpatient Diabetes medications: NA Current orders for Inpatient glycemic control: Novolog 0-5 units qhs, Novolog 0-15 units tid  Inpatient Diabetes Program Recommendations: A1C has been ordered . Currently ordered Solumedrol 50 mg Q am which is contributing to hyperglycemia but has been decreased from yesterday.  For now as blood sugars are improved and steroids have been decreased, continue current insulin orders.   Gentry Fitz, RN, BA, MHA, CDE Diabetes Coordinator Inpatient Diabetes Program  [phone] (Team Pager) [phone] ([place]) 05/03/2016 10:13 AM

## 2016-05-03 NOTE — Progress Notes (Signed)
[facility] at [place] NAME: Kaoru Benda    MR#:  [id number]  DATE OF BIRTH:  05-Apr-1945  SUBJECTIVE:  CHIEF COMPLAINT:  Shortness of breath  And cough are better , patient has history of borderline diabetes mellitus  REVIEW OF SYSTEMS:  CONSTITUTIONAL: No fever, fatigue or weakness.  EYES: No blurred or double vision.  EARS, NOSE, AND THROAT: No tinnitus or ear pain.  RESPIRATORY: Still reporting cough, slightly improved shortness of breath, some wheezing , denies hemoptysis.  CARDIOVASCULAR: No chest pain, orthopnea, edema.  GASTROINTESTINAL: No nausea, vomiting, diarrhea or abdominal pain.  GENITOURINARY: No dysuria, hematuria.  ENDOCRINE: No polyuria, nocturia,  HEMATOLOGY: No anemia, easy bruising or bleeding SKIN: No rash or lesion. MUSCULOSKELETAL: No joint pain or arthritis.   NEUROLOGIC: No tingling, numbness, weakness.  PSYCHIATRY: No anxiety or depression.   DRUG ALLERGIES:   Allergies  Allergen Reactions  . Erythromycin Hives  . Hydromorphone Hives  . Cefuroxime Nausea And Vomiting  . Ciprocinonide [Fluocinolone]     Muscle pain   . Codeine     Other reaction(s): Vomiting  . Floxin [Ofloxacin] Nausea And Vomiting  . Keflex [Cephalexin] Nausea And Vomiting  . Levofloxacin Nausea And Vomiting  . Sulfa Antibiotics Nausea And Vomiting  . Ciprofloxacin Nausea And Vomiting    VITALS:  Blood pressure (!) 142/71, pulse 97, temperature 98 F (36.7 C), temperature source Oral, resp. rate 18, height 5\' 8"  (1.727 m), weight 82.7 kg (182 lb 6.4 oz), SpO2 95 %.  PHYSICAL EXAMINATION:  GENERAL:  71 y.o.-year-old patient lying in the bed with no acute distress.  EYES: Pupils equal, round, reactive to light and accommodation. No scleral icterus. Extraocular muscles intact.  HEENT: Head atraumatic, normocephalic. Oropharynx and nasopharynx clear.  NECK:  Supple, no jugular venous distention. No thyroid enlargement, no  tenderness.  LUNGS:Coarse bronchial breath sounds bilaterally, NO  wheezing, no rales,rhonchi or crepitation. No use of accessory muscles of respiration.  CARDIOVASCULAR: S1, S2 normal. No murmurs, rubs, or gallops.  ABDOMEN: Soft, nontender, nondistended. Bowel sounds present. No organomegaly or mass.  EXTREMITIES: No pedal edema, cyanosis, or clubbing.  NEUROLOGIC: Cranial nerves II through XII are intact. Muscle strength 5/5 in all extremities. Sensation intact. Gait not checked.  PSYCHIATRIC: The patient is alert and oriented x 3.  SKIN: No obvious rash, lesion, or ulcer.    LABORATORY PANEL:   CBC  Recent Labs Lab 05/02/16 0513  WBC 13.7*  HGB 10.1*  HCT 30.0*  PLT 213   ------------------------------------------------------------------------------------------------------------------  Chemistries   Recent Labs Lab 05/02/16 0513  NA 135  K 3.7  CL 100*  CO2 25  GLUCOSE 484*  BUN 14  CREATININE 0.85  CALCIUM 7.7*   ------------------------------------------------------------------------------------------------------------------  Cardiac Enzymes No results for input(s): TROPONINI in the last 168 hours. ------------------------------------------------------------------------------------------------------------------  RADIOLOGY:  No results found.  EKG:   Orders placed or performed during the hospital encounter of 05/01/16  . ED EKG  . 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 Continue home medications.  * DVT prophylaxis with Lovenox PT consult  All the records are reviewed and case discussed with Care Management/Social Workerr. Management plans discussed with the patient, family and they are in agreement.  CODE STATUS: fc   TOTAL TIME TAKING CARE OF THIS PATIENT: 35  minutes.   POSSIBLE D/C IN 1-2  DAYS, DEPENDING ON CLINICAL CONDITION.  Note: This dictation was prepared with Dragon dictation along with smaller phrase technology. Any transcriptional errors that result from this process are unintentional.   Nicholes Mango M.D on 05/03/2016 at 5:35 PM  Between 7am to 6pm - Pager - [phone] After 6pm go to [email or web address] - password [email or web address] [facility]  Office  [phone]  CC: Primary care physician; Sharyne Peach, MD

## 2016-05-04 LAB — HEMOGLOBIN A1C
HEMOGLOBIN A1C: 7.6 % — AB (ref 4.8–5.6)
MEAN PLASMA GLUCOSE: 171 mg/dL

## 2016-05-04 LAB — GLUCOSE, CAPILLARY: Glucose-Capillary: 119 mg/dL — ABNORMAL HIGH (ref 65–99)

## 2016-05-04 MED ORDER — ALBUTEROL SULFATE HFA 108 (90 BASE) MCG/ACT IN AERS: 2.0000 | INHALATION_SPRAY | Freq: Four times a day (QID) | RESPIRATORY_TRACT | 2 refills | Status: DC | PRN

## 2016-05-04 MED ORDER — LIVING WELL WITH DIABETES BOOK
Freq: Once | Status: AC
  Administered 2016-05-04: 13:00:00
  Filled 2016-05-04: qty 1

## 2016-05-04 MED ORDER — DOXYCYCLINE HYCLATE 100 MG PO TABS: 100.0000 mg | ORAL_TABLET | Freq: Two times a day (BID) | ORAL | 0 refills | Status: DC

## 2016-05-04 MED ORDER — PREDNISONE 10 MG (21) PO TBPK: 10.0000 mg | ORAL_TABLET | Freq: Every day | ORAL | 0 refills | Status: DC

## 2016-05-04 MED ORDER — ACETAMINOPHEN 325 MG PO TABS: 650.0000 mg | ORAL_TABLET | Freq: Four times a day (QID) | ORAL | Status: DC | PRN

## 2016-05-04 NOTE — Progress Notes (Signed)
Inpatient Diabetes Program Recommendations  AACE/ADA: New Consensus Statement on Inpatient Glycemic Control (2015)  Target Ranges:  Prepandial:   less than 140 mg/dL      Peak postprandial:   less than 180 mg/dL (1-2 hours)      Critically ill patients:  140 - 180 mg/dL   Lab Results  Component Value Date   GLUCAP 119 (H) 05/04/2016   HGBA1C 7.6 (H) 05/03/2016    Review of Glycemic Control  Results for Joanna Ortiz, Joanna Ortiz (MRN [id number]) as of 05/04/2016 11:00  Ref. Range 05/03/2016 07:37 05/03/2016 11:43 05/03/2016 17:13 05/03/2016 20:26 05/04/2016 07:41  Glucose-Capillary Latest Ref Range: 65 - 99 mg/dL 134 (H) 334 (H) 272 (H) 227 (H) 119 (H)    Diabetes history:No Outpatient Diabetes medications: NA Current orders for Inpatient glycemic control: Novolog 0-5 units qhs, Novolog 0-15 units tid  * oral steroids qam  Inpatient Diabetes Program Recommendations: A1C 7.6%     Elevated A1C (6.5 or greater). Per ADA guidelines this meets the criteria of a diagnosis for diabetes. If appropriate, please consider placing this diagnosis in chart and inform patient of diagnosis.   MD- If appropriate d/c patient home with an an order for a glucometer, lancets and testing strips and encourage CBG testing fasting and 2 hours after supper. Have patient bring records to next MD visit.   RN- If the patient is diagnosed with diabetes- please order the Living Well with Diabetes book (manage orders), review and encourage follow up with primary care provider.    Gentry Fitz, RN, BA, MHA, CDE Diabetes Coordinator Inpatient Diabetes Program  [phone] (Team Pager) [phone] ([facility] [facility]) 05/04/2016 11:10 AM

## 2016-05-04 NOTE — Plan of Care (Signed)
Problem: Food- and Nutrition-Related Knowledge Deficit (NB-1.1) Goal: Nutrition education Formal process to instruct or train a patient/client in a skill or to impart knowledge to help patients/clients voluntarily manage or modify food choices and eating behavior to maintain or improve health.  Outcome: Completed/Met Date Met: 05/04/16  RD consulted for nutrition education regarding diabetes.   71 year old female found to have steroid-induced hyperglycemia with history of borderline diabetes mellitus.  Lab Results  Component Value Date   HGBA1C 7.6 (H) 05/03/2016   CBG 119-334 past 24 hrs  Spoke with patient and husband at bedside. Patient reports she has never had high blood sugars before and this is completely new to her but she is very motivated to help control her blood sugar with diet. Patient reports she has a great appetite and denies any weight loss. She reports eating 3 meals per day and occasionally snacks between meals.   Typical Intake: Breakfast: toast, eggs, bacon, and tea or cereal in milk Lunch: sandwich or soup Dinner: meat with vegetables Snacks: chips or crackers Drinks: coffee AM, water throughout day, occasionally regular soda  RD provided "Carbohydrate Counting for People with Diabetes" handout from the Academy of Nutrition and Dietetics. Discussed different food groups and their effects on blood sugar, emphasizing carbohydrate-containing foods. Provided list of carbohydrates and recommended serving sizes of common foods.  RD provided "Using Nutrition Labels: Carbohydrates" handout from the Academy of Nutrition and Dietetics. Discussed how to read a nutrition label including looking at serving size and servings per container. Reviewed that there are 15 grams of carbohydrate in one carbohydrate choice. Encouraged patient to use chart for range of carbohydrate grams per choice when reading nutrition labels.  Discussed importance of controlled and consistent  carbohydrate intake throughout the day. Provided examples of ways to balance meals/snacks and encouraged intake of high-fiber, whole grain complex carbohydrates. Teach back method used.  Expect good compliance.  Body mass index is 27.28 kg/m. Pt meets criteria for overweight based on current BMI.  Current diet order is CHO Modified, patient is consuming approximately 70-100% of meals at this time. Labs and medications reviewed. No further nutrition interventions warranted at this time. RD contact information provided. If additional nutrition issues arise, please re-consult RD.  Willey Blade, MS, RD, LDN Pager: [phone] After Hours Pager: [phone]

## 2016-05-04 NOTE — Discharge Summary (Signed)
[facility] [place] NAME: Joanna Ortiz    MR#:  [id number]  DATE OF BIRTH:  December 01, 1945  DATE OF ADMISSION:  05/01/2016 ADMITTING PHYSICIAN: Hillary Bow, MD  DATE OF DISCHARGE: 05/04/16 PRIMARY CARE PHYSICIAN: Sharyne Peach, MD    ADMISSION DIAGNOSIS:  Bronchospasm [J98.01]  DISCHARGE DIAGNOSIS:  Active Problems:   Acute bronchitis new onset DM  SECONDARY DIAGNOSIS:   Past Medical History:  Diagnosis Date  . Crohn disease ([place])   . Thyroid disease     HOSPITAL COURSE:  HPI  : Joanna Ortiz  is a 71 y.o. female with a known history of crohns disease, hypothyroid here with Wheezing and shortness of breath. Patient was started on prednisone and Levaquin 8 days back by her primary care physician. She did not improve and then back to her physician and was sent to the emergency room due to tachycardia and sick if it and wheezing and decreased air entry. Patient had pneumonia in January. She recovered completely with distal 10 days back when she started coughing. Her daughter had a cold recently. Patient did not have any lung problems prior to January. Does not smoke. Here she has been tachycardic 8 from 110-120. Afebrile. Chest x-ray shows no pneumonia. W BC count 10.7    * Acute bronchitis -Clinically improved With steroids Supportive treatment- -Tapered IV steroids to by mouth prednisone, continue antibiotic doxycycline - Scheduled and as needed Nebulizers given during the hospital course - Consult pulmonary if no improvement - Failed outpatient treatment Flu test is negative -Incentive spirometry   *New onset DM with Steroid induced hyperglycemia Hemoglobin A1c 7.6 Patient was seen and educated by diabetic coordinator and dietitian regarding diabetic diet Encourage diet and exercise at this time Prescriptions were given for glucometer, test strips and lancets plan is to check her CBGs daily during fasting time  and 2 hours after supper, take the diabetic log to primary care physician    * 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 Continue home medications.  * DVT prophylaxis with Lovenox  PT consult - no PT recommendations  DISCHARGE CONDITIONS:   stable  CONSULTS OBTAINED:     PROCEDURES  none  DRUG ALLERGIES:   Allergies  Allergen Reactions  . Erythromycin Hives  . Hydromorphone Hives  . Cefuroxime Nausea And Vomiting  . Ciprocinonide [Fluocinolone]     Muscle pain   . Codeine     Other reaction(s): Vomiting  . Floxin [Ofloxacin] Nausea And Vomiting  . Keflex [Cephalexin] Nausea And Vomiting  . Levofloxacin Nausea And Vomiting  . Sulfa Antibiotics Nausea And Vomiting  . Ciprofloxacin Nausea And Vomiting    DISCHARGE MEDICATIONS:   Current Discharge Medication List    START taking these medications   Details  acetaminophen (TYLENOL) 325 MG tablet Take 2 tablets (650 mg total) by mouth every 6 (six) hours as needed for mild pain (or Fever >/= 101).    albuterol (PROVENTIL HFA;VENTOLIN HFA) 108 (90 Base) MCG/ACT inhaler Inhale 2 puffs into the lungs every 6 (six) hours as needed for wheezing or shortness of breath. Qty: 1 Inhaler, Refills: 2    doxycycline (VIBRA-TABS) 100 MG tablet Take 1 tablet (100 mg total) by mouth every 12 (twelve) hours. Qty: 10 tablet, Refills: 0    predniSONE (STERAPRED UNI-PAK 21 TAB) 10 MG (21) TBPK tablet Take 1 tablet (10 mg total) by mouth daily. Take 6 tablets by mouth for 1 day  followed by  5 tablets by mouth for 1 day followed by  4 tablets by mouth for 1 day followed by  3 tablets by mouth for 1 day followed by  2 tablets by mouth for 1 day followed by  1 tablet by mouth for a day and stop Qty: 21 tablet, Refills: 0      CONTINUE these medications which have NOT CHANGED   Details  cholestyramine (QUESTRAN) 4 G packet Take 4 g by mouth 2 (two) times daily as needed (stomach problems).   Refills: 12    colestipol (COLESTID) 1 G tablet Take 1 g by mouth daily as needed (for diarrhea).     Multiple Vitamin (MULTIVITAMIN) tablet Take 1 tablet by mouth daily.    Omega-3 Fatty Acids (FISH OIL) 1000 MG CAPS Take 1,000 mg by mouth daily.     SYNTHROID 100 MCG tablet Take 100 mcg by mouth daily before breakfast.  Refills: 11    thyroid (ARMOUR) 30 MG tablet Take 30 mg by mouth every morning.    TURMERIC CURCUMIN PO Take 1 capsule by mouth daily.    cyanocobalamin (,VITAMIN B-12,) 1000 MCG/ML injection Inject 1,000 mcg into the muscle every 30 (thirty) days.    HUMIRA PEN 40 MG/0.8ML PNKT Inject 40 mg into the skin every 14 (fourteen) days.     traMADol (ULTRAM) 50 MG tablet Take 1 tablet (50 mg total) by mouth every 6 (six) hours as needed. Qty: 20 tablet, Refills: 0      STOP taking these medications     Acetaminophen 500 MG coapsule      nitrofurantoin, macrocrystal-monohydrate, (MACROBID) 100 MG capsule          DISCHARGE INSTRUCTIONS:   Follow-up with primary care physician in a week Diabetic diet Blood sugar log   DIET:  Diabetic diet  DISCHARGE CONDITION:  Stable  ACTIVITY:  Activity as tolerated  OXYGEN:  Home Oxygen: No.   Oxygen Delivery: room air  DISCHARGE LOCATION:  home   If you experience worsening of your admission symptoms, develop shortness of breath, life threatening emergency, suicidal or homicidal thoughts you must seek medical attention immediately by calling 911 or calling your MD immediately  if symptoms less severe.  You Must read complete instructions/literature along with all the possible adverse reactions/side effects for all the Medicines you take and that have been prescribed to you. Take any new Medicines after you have completely understood and accpet all the possible adverse reactions/side effects.   Please note  You were cared for by a hospitalist during your hospital stay. If you have any questions about your  discharge medications or the care you received while you were in the hospital after you are discharged, you can call the unit and asked to speak with the hospitalist on call if the hospitalist that took care of you is not available. Once you are discharged, your primary care physician will handle any further medical issues. Please note that NO REFILLS for any discharge medications will be authorized once you are discharged, as it is imperative that you return to your primary care physician (or establish a relationship with a primary care physician if you do not have one) for your aftercare needs so that they can reassess your need for medications and monitor your lab values.     Today  Chief Complaint  Patient presents with  . Fever  . Cough   Patient is feeling fine and denies any shortness of breath or chest  pain. Wants to try diet and exercise for new onset diabetes  ROS:  CONSTITUTIONAL: Denies fevers, chills. Denies any fatigue, weakness.  EYES: Denies blurry vision, double vision, eye pain. EARS, NOSE, THROAT: Denies tinnitus, ear pain, hearing loss. RESPIRATORY: Denies cough, wheeze, shortness of breath.  CARDIOVASCULAR: Denies chest pain, palpitations, edema.  GASTROINTESTINAL: Denies nausea, vomiting, diarrhea, abdominal pain. Denies bright red blood per rectum. GENITOURINARY: Denies dysuria, hematuria. ENDOCRINE: Denies nocturia or thyroid problems. HEMATOLOGIC AND LYMPHATIC: Denies easy bruising or bleeding. SKIN: Denies rash or lesion. MUSCULOSKELETAL: Denies pain in neck, back, shoulder, knees, hips or arthritic symptoms.  NEUROLOGIC: Denies paralysis, paresthesias.  PSYCHIATRIC: Denies anxiety or depressive symptoms.   VITAL SIGNS:  Blood pressure (!) 149/68, pulse 97, temperature 97.9 F (36.6 C), temperature source Oral, resp. rate 16, height 5\' 8"  (1.727 m), weight 81.4 kg (179 lb 6.4 oz), SpO2 98 %.  I/O:    Intake/Output Summary (Last 24 hours) at 05/04/16  1359 Last data filed at 05/04/16 0900  Gross per 24 hour  Intake              363 ml  Output                0 ml  Net              363 ml    PHYSICAL EXAMINATION:  GENERAL:  71 y.o.-year-old patient lying in the bed with no acute distress.  EYES: Pupils equal, round, reactive to light and accommodation. No scleral icterus. Extraocular muscles intact.  HEENT: Head atraumatic, normocephalic. Oropharynx and nasopharynx clear.  NECK:  Supple, no jugular venous distention. No thyroid enlargement, no tenderness.  LUNGS: Normal breath sounds bilaterally, no wheezing, rales,rhonchi or crepitation. No use of accessory muscles of respiration.  CARDIOVASCULAR: S1, S2 normal. No murmurs, rubs, or gallops.  ABDOMEN: Soft, non-tender, non-distended. Bowel sounds present. No organomegaly or mass.  EXTREMITIES: No pedal edema, cyanosis, or clubbing.  NEUROLOGIC: Cranial nerves II through XII are intact. Muscle strength 5/5 in all extremities. Sensation intact. Gait not checked.  PSYCHIATRIC: The patient is alert and oriented x 3.  SKIN: No obvious rash, lesion, or ulcer.   DATA REVIEW:   CBC  Recent Labs Lab 05/02/16 0513  WBC 13.7*  HGB 10.1*  HCT 30.0*  PLT 213    Chemistries   Recent Labs Lab 05/02/16 0513  NA 135  K 3.7  CL 100*  CO2 25  GLUCOSE 484*  BUN 14  CREATININE 0.85  CALCIUM 7.7*    Cardiac Enzymes No results for input(s): TROPONINI in the last 168 hours.  Microbiology Results  Results for orders placed or performed during the hospital encounter of 01/07/15  Urine culture     Status: None   Collection Time: 01/07/15  6:40 PM  Result Value Ref Range Status   Specimen Description URINE, CLEAN CATCH  Final   Special Requests NONE  Final   Culture MULTIPLE SPECIES PRESENT, SUGGEST RECOLLECTION  Final   Report Status 01/09/2015 FINAL  Final    RADIOLOGY:  Dg Chest 2 View  Result Date: 05/01/2016 CLINICAL DATA:  Cough, congestion and fever. EXAM: CHEST  2 VIEW  COMPARISON:  Chest radiograph 09/06/2014 FINDINGS: Normal cardiomediastinal contours. Mild hyperinflation and generalized increased pulmonary markings. No focal airspace consolidation or pulmonary edema. No pneumothorax or pleural effusion. Status post left shoulder hemiarthroplasty. IMPRESSION: No active cardiopulmonary disease. Electronically Signed   By: Ulyses Jarred M.D.   On: 05/01/2016 14:24  EKG:   Orders placed or performed during the hospital encounter of 05/01/16  . ED EKG  . ED EKG      Management plans discussed with the patient, family and they are in agreement.  CODE STATUS:     Code Status Orders        Start     Ordered   05/01/16 1803  Full code  Continuous     05/01/16 1803    Code Status History    Date Active Date Inactive Code Status Order ID Comments User Context   This patient has a current code status but no historical code status.      TOTAL TIME TAKING CARE OF THIS PATIENT: 43  minutes.   Note: This dictation was prepared with Dragon dictation along with smaller phrase technology. Any transcriptional errors that result from this process are unintentional.   @[facility] @  on 05/04/2016 at 1:59 PM  Between 7am to 6pm - Pager - [phone]  After 6pm go to [email or web address] - password [email or web address] [facility]  Office  [phone]  CC: Primary care physician; Sharyne Peach, MD

## 2016-05-04 NOTE — Evaluation (Signed)
Physical Therapy Evaluation Patient Details Name: Joanna Ortiz MRN: [id number] DOB: 26-Aug-1945 Today's Date: 05/04/2016   History of Present Illness  Joanna Ortiz is a 71 y.o. female with a known history of crohns disease andhypothyroid who is here with wheezing and shortness of breath. Patient was started on prednisone and Levaquin 8 days back by her primary care physician. She did not improve and then back to her physician and was sent to the emergency room due to tachycardia and sick if it and wheezing and decreased air entry. Patient had pneumonia in January. She recovered completely. Her daughter had a cold recently. Patient did not have any lung problems prior to January. Does not smoke. Here she has been tachycardic from 110-120. Afebrile. Chest x-ray shows no pneumonia. W BC count 10.7. She is currently admitted with acute bronchitis. At time of PT evaluation she is feeling improved  Clinical Impression  Pt demonstrates excellent strength and stability with bed mobility, transfers, and ambulation. She is able to ambulate a lap around RN station. She is also able to perform horizontal and vertical head turns with mild slowing of speed but no lateral gait deviation. SaO2 at or above 94% on room air throughout and pt reprots 2/10 on BORG scale for dysnpea. Reports minimal fatigue with ambulation. HR increases to 115-120 bpm during ambulation. No chest pain. Excellent balance with negative Rhomberg and single leg balance of greater than 10 seconds. No PT needs identified at this time. Pt encouraged to remain active during admission. Current order will be completed. Please enter new order if status or needs change.      Follow Up Recommendations No PT follow up    Equipment Recommendations  None recommended by PT    Recommendations for Other Services       Precautions / Restrictions Precautions Precautions: Fall Restrictions Weight Bearing Restrictions: No      Mobility  Bed  Mobility Overal bed mobility: Independent             General bed mobility comments: Good speed/sequencing  Transfers Overall transfer level: Independent Equipment used: None             General transfer comment: Good speed, sequencing, and stability. No deficits identified  Ambulation/Gait Ambulation/Gait assistance: Supervision Ambulation Distance (Feet): 200 Feet Assistive device: None Gait Pattern/deviations: WFL(Within Functional Limits) Gait velocity: WFL Gait velocity interpretation: at or above normal speed for age/gender General Gait Details: Pt able to ambulate a lap around RN station. Able to perform horizontal and vertical head turns with mild slowing of speed but no lateral gait deviation. SaO2 at or above 94% on room air throughout and pt reprots 2/10 on BORG scale for dysnpea. Reports minimal fatigue with ambulation. HR increases to 115-120 bpm during ambulation. No chest pain  Stairs            Wheelchair Mobility    Modified Rankin (Stroke Patients Only)       Balance Overall balance assessment: Independent Sitting-balance support: No upper extremity supported Sitting balance-Leahy Scale: Normal     Standing balance support: No upper extremity supported Standing balance-Leahy Scale: Good Standing balance comment: Negative Rhomberg. Single leg balance >10 seconds                             Pertinent Vitals/Pain Pain Assessment: 0-10 Pain Score: 4  Pain Location: Frontal headaches Pain Descriptors / Indicators: Dull Pain Intervention(s): Patient requesting pain meds-RN notified  Home Living Family/patient expects to be discharged to:: Private residence Living Arrangements: Spouse/significant other Available Help at Discharge: Family Type of Home: House Home Access: Stairs to enter Entrance Stairs-Rails: Right Entrance Stairs-Number of Steps: 4 Home Layout: Multi-level;Able to live on main level with  bedroom/bathroom Home Equipment: None      Prior Function Level of Independence: Independent         Comments: Independent with ADLs/IADLs. Drives. Full community ambulator without assistive device. No falls in the last 12 months     Hand Dominance   Dominant Hand: Left    Extremity/Trunk Assessment   Upper Extremity Assessment Upper Extremity Assessment: Overall WFL for tasks assessed    Lower Extremity Assessment Lower Extremity Assessment: Overall WFL for tasks assessed       Communication   Communication: No difficulties  Cognition Arousal/Alertness: Awake/alert Behavior During Therapy: WFL for tasks assessed/performed Overall Cognitive Status: Within Functional Limits for tasks assessed                      General Comments      Exercises     Assessment/Plan    PT Assessment Patent does not need any further PT services  PT Problem List         PT Treatment Interventions      PT Goals (Current goals can be found in the Care Plan section)  Acute Rehab PT Goals PT Goal Formulation: All assessment and education complete, DC therapy    Frequency     Barriers to discharge        Co-evaluation               End of Session Equipment Utilized During Treatment: Gait belt Activity Tolerance: Patient tolerated treatment well Patient left: in bed;with call bell/phone within reach;with nursing/sitter in room;with family/visitor present Nurse Communication: Mobility status PT Visit Diagnosis: Muscle weakness (generalized) (M62.81)    Functional Limitation: Mobility: Walking and moving around Mobility: Walking and Moving Around Current Status [phone]): 0 percent impaired, limited or restricted Mobility: Walking and Moving Around Goal Status [phone]): 0 percent impaired, limited or restricted Mobility: Walking and Moving Around Discharge Status [phone]): 0 percent impaired, limited or restricted    Time: 0900-0920 PT Time Calculation (min)  (ACUTE ONLY): 20 min   Charges:   PT Evaluation $PT Eval Low Complexity: 1 Procedure     PT G Codes:   PT G-Codes **NOT FOR INPATIENT CLASS** Functional Limitation: Mobility: Walking and moving around Mobility: Walking and Moving Around Current Status (E0923): 0 percent impaired, limited or restricted Mobility: Walking and Moving Around Goal Status (R0076): 0 percent impaired, limited or restricted Mobility: Walking and Moving Around Discharge Status (A2633): 0 percent impaired, limited or restricted    Lyndel Safe Jarmar Rousseau PT, DPT   Ruben Pyka 05/04/2016, 11:13 AM

## 2016-05-04 NOTE — Plan of Care (Signed)
Problem: Education: Goal: Knowledge of [facility] Education information/materials will improve Outcome: Progressing VSS, free of falls during shift.  No complaints during shift.  Pt reports able to sleep well overnight.  Bed in low position, call bell within reach.  WCTM.

## 2016-05-04 NOTE — Progress Notes (Signed)
Dc instructions given. rx given, follow up instructions given. Pt and husband verbalizes clear understanding and who to call if they have questions.

## 2016-05-04 NOTE — Discharge Instructions (Signed)
Follow-up with primary care physician in a week Diabetic diet Blood sugar log

## 2016-05-05 LAB — HEMOGLOBIN A1C
Hgb A1c MFr Bld: 7.6 % — ABNORMAL HIGH (ref 4.8–5.6)
MEAN PLASMA GLUCOSE: 171 mg/dL

## 2016-05-07 LAB — GLUCOSE, CAPILLARY: Glucose-Capillary: 220 mg/dL — ABNORMAL HIGH (ref 65–99)

## 2016-05-17 ENCOUNTER — Inpatient Hospital Stay
Admission: EM | Admit: 2016-05-17 | Discharge: 2016-05-21 | DRG: 386 | Disposition: A | Discharge status: Home / Self Care | Payer: Federal, State, Local not specified - PPO | Attending: Internal Medicine | Admitting: Internal Medicine

## 2016-05-17 ENCOUNTER — Emergency Department: Payer: Federal, State, Local not specified - PPO

## 2016-05-17 DIAGNOSIS — E039 Hypothyroidism, unspecified: Secondary | ICD-10-CM | POA: Diagnosis present

## 2016-05-17 DIAGNOSIS — R1084 Generalized abdominal pain: Secondary | ICD-10-CM | POA: Diagnosis not present

## 2016-05-17 DIAGNOSIS — K909 Intestinal malabsorption, unspecified: Secondary | ICD-10-CM | POA: Diagnosis present

## 2016-05-17 DIAGNOSIS — Z882 Allergy status to sulfonamides status: Secondary | ICD-10-CM

## 2016-05-17 DIAGNOSIS — K5710 Diverticulosis of small intestine without perforation or abscess without bleeding: Secondary | ICD-10-CM | POA: Diagnosis present

## 2016-05-17 DIAGNOSIS — R630 Anorexia: Secondary | ICD-10-CM

## 2016-05-17 DIAGNOSIS — E876 Hypokalemia: Secondary | ICD-10-CM | POA: Diagnosis present

## 2016-05-17 DIAGNOSIS — M459 Ankylosing spondylitis of unspecified sites in spine: Secondary | ICD-10-CM | POA: Diagnosis present

## 2016-05-17 DIAGNOSIS — Z803 Family history of malignant neoplasm of breast: Secondary | ICD-10-CM

## 2016-05-17 DIAGNOSIS — K9049 Malabsorption due to intolerance, not elsewhere classified: Secondary | ICD-10-CM | POA: Diagnosis present

## 2016-05-17 DIAGNOSIS — Z7984 Long term (current) use of oral hypoglycemic drugs: Secondary | ICD-10-CM

## 2016-05-17 DIAGNOSIS — K50918 Crohn's disease, unspecified, with other complication: Secondary | ICD-10-CM | POA: Diagnosis not present

## 2016-05-17 DIAGNOSIS — Z87891 Personal history of nicotine dependence: Secondary | ICD-10-CM

## 2016-05-17 DIAGNOSIS — E785 Hyperlipidemia, unspecified: Secondary | ICD-10-CM | POA: Diagnosis present

## 2016-05-17 DIAGNOSIS — M069 Rheumatoid arthritis, unspecified: Secondary | ICD-10-CM | POA: Diagnosis present

## 2016-05-17 DIAGNOSIS — Z881 Allergy status to other antibiotic agents status: Secondary | ICD-10-CM

## 2016-05-17 DIAGNOSIS — R509 Fever, unspecified: Secondary | ICD-10-CM

## 2016-05-17 DIAGNOSIS — R609 Edema, unspecified: Secondary | ICD-10-CM

## 2016-05-17 DIAGNOSIS — D510 Vitamin B12 deficiency anemia due to intrinsic factor deficiency: Secondary | ICD-10-CM | POA: Diagnosis present

## 2016-05-17 DIAGNOSIS — E1165 Type 2 diabetes mellitus with hyperglycemia: Secondary | ICD-10-CM | POA: Diagnosis present

## 2016-05-17 DIAGNOSIS — I998 Other disorder of circulatory system: Secondary | ICD-10-CM | POA: Diagnosis present

## 2016-05-17 DIAGNOSIS — Z8711 Personal history of peptic ulcer disease: Secondary | ICD-10-CM

## 2016-05-17 DIAGNOSIS — Z8249 Family history of ischemic heart disease and other diseases of the circulatory system: Secondary | ICD-10-CM

## 2016-05-17 DIAGNOSIS — Z90710 Acquired absence of both cervix and uterus: Secondary | ICD-10-CM

## 2016-05-17 DIAGNOSIS — K5090 Crohn's disease, unspecified, without complications: Secondary | ICD-10-CM

## 2016-05-17 DIAGNOSIS — R109 Unspecified abdominal pain: Secondary | ICD-10-CM | POA: Diagnosis present

## 2016-05-17 DIAGNOSIS — Z885 Allergy status to narcotic agent status: Secondary | ICD-10-CM

## 2016-05-17 DIAGNOSIS — Z888 Allergy status to other drugs, medicaments and biological substances status: Secondary | ICD-10-CM

## 2016-05-17 DIAGNOSIS — Z79899 Other long term (current) drug therapy: Secondary | ICD-10-CM

## 2016-05-17 DIAGNOSIS — D638 Anemia in other chronic diseases classified elsewhere: Secondary | ICD-10-CM | POA: Diagnosis present

## 2016-05-17 HISTORY — DX: Systemic involvement of connective tissue, unspecified: M35.9

## 2016-05-17 HISTORY — DX: Type 2 diabetes mellitus without complications: E11.9

## 2016-05-17 LAB — CBC
HEMATOCRIT: 29.9 % — AB (ref 35.0–47.0)
HEMOGLOBIN: 10.2 g/dL — AB (ref 12.0–16.0)
MCH: 28 pg (ref 26.0–34.0)
MCHC: 34.1 g/dL (ref 32.0–36.0)
MCV: 82.1 fL (ref 80.0–100.0)
Platelets: 204 10*3/uL (ref 150–440)
RBC: 3.64 MIL/uL — AB (ref 3.80–5.20)
RDW: 16.9 % — ABNORMAL HIGH (ref 11.5–14.5)
WBC: 9.8 10*3/uL (ref 3.6–11.0)

## 2016-05-17 LAB — BASIC METABOLIC PANEL
ANION GAP: 8 (ref 5–15)
BUN: 7 mg/dL (ref 6–20)
CALCIUM: 8.1 mg/dL — AB (ref 8.9–10.3)
CHLORIDE: 104 mmol/L (ref 101–111)
CO2: 21 mmol/L — AB (ref 22–32)
Creatinine, Ser: 0.8 mg/dL (ref 0.44–1.00)
GFR calc Af Amer: >60 mL/min (ref >60)
GFR calc non Af Amer: >60 mL/min (ref >60)
GLUCOSE: 154 mg/dL — AB (ref 65–99)
Potassium: 2.5 mmol/L — CL (ref 3.5–5.1)
Sodium: 133 mmol/L — ABNORMAL LOW (ref 135–145)

## 2016-05-17 LAB — LIPASE, BLOOD: Lipase: 13 U/L (ref 11–51)

## 2016-05-17 LAB — URINALYSIS, COMPLETE (UACMP) WITH MICROSCOPIC
BACTERIA UA: NONE SEEN
Bilirubin Urine: NEGATIVE
Glucose, UA: NEGATIVE mg/dL
Ketones, ur: NEGATIVE mg/dL
Leukocytes, UA: NEGATIVE
Nitrite: NEGATIVE
PROTEIN: NEGATIVE mg/dL
SPECIFIC GRAVITY, URINE: 1.004 — AB (ref 1.005–1.030)
pH: 6 (ref 5.0–8.0)

## 2016-05-17 LAB — HEPATIC FUNCTION PANEL
ALBUMIN: 2.8 g/dL — AB (ref 3.5–5.0)
ALK PHOS: 51 U/L (ref 38–126)
ALT: 15 U/L (ref 14–54)
AST: 15 U/L (ref 15–41)
BILIRUBIN INDIRECT: 0.7 mg/dL (ref 0.3–0.9)
Bilirubin, Direct: 0.1 mg/dL (ref 0.1–0.5)
TOTAL PROTEIN: 6.5 g/dL (ref 6.5–8.1)
Total Bilirubin: 0.8 mg/dL (ref 0.3–1.2)

## 2016-05-17 LAB — GLUCOSE, CAPILLARY: Glucose-Capillary: 149 mg/dL — ABNORMAL HIGH (ref 65–99)

## 2016-05-17 LAB — MAGNESIUM: Magnesium: 1.4 mg/dL — ABNORMAL LOW (ref 1.7–2.4)

## 2016-05-17 MED ORDER — SODIUM CHLORIDE 0.9 % IV SOLN
30.0000 meq | Freq: Once | INTRAVENOUS | Status: AC
  Administered 2016-05-17: 30 meq via INTRAVENOUS
  Filled 2016-05-17: qty 15

## 2016-05-17 MED ORDER — IOPAMIDOL (ISOVUE-300) INJECTION 61%: 30.0000 mL | Freq: Once | INTRAVENOUS | Status: DC

## 2016-05-17 MED ORDER — ONDANSETRON HCL 4 MG/2ML IJ SOLN
4.0000 mg | Freq: Once | INTRAMUSCULAR | Status: AC
  Administered 2016-05-17: 4 mg via INTRAVENOUS
  Filled 2016-05-17: qty 2

## 2016-05-17 MED ORDER — MAGNESIUM SULFATE 2 GM/50ML IV SOLN
2.0000 g | Freq: Once | INTRAVENOUS | Status: AC
  Administered 2016-05-17: 2 g via INTRAVENOUS
  Filled 2016-05-17: qty 50

## 2016-05-17 MED ORDER — ACETAMINOPHEN 500 MG PO TABS
1000.0000 mg | ORAL_TABLET | ORAL | Status: AC
  Administered 2016-05-17: 1000 mg via ORAL
  Filled 2016-05-17: qty 2

## 2016-05-17 MED ORDER — SODIUM CHLORIDE 0.9 % IV BOLUS (SEPSIS)
1000.0000 mL | Freq: Once | INTRAVENOUS | Status: AC
  Administered 2016-05-17: 1000 mL via INTRAVENOUS

## 2016-05-17 NOTE — ED Triage Notes (Signed)
Pt in with co general malaise x 3 days, has had nausea and today fsbs was over 500 at home. States was dx with diabetes 3 weeks ago, is on oral medication.

## 2016-05-17 NOTE — ED Notes (Signed)
General Surgeon at bedside.

## 2016-05-17 NOTE — ED Notes (Signed)
CBG 149. 

## 2016-05-17 NOTE — ED Notes (Signed)
ED Provider at bedside. 

## 2016-05-17 NOTE — ED Provider Notes (Signed)
[facility] Emergency Department Provider Note  ____________________________________________   First MD Initiated Contact with Patient 05/17/16 2229     (approximate)  I have reviewed the triage vital signs and the nursing notes.   HISTORY  Chief Complaint Hyperglycemia  HPI KRISTEL DURKEE is a 71 y.o. female history a recent diagnosis of diabetes possibly brought on by steroid treatment, and recently treated for bronchitis and bronchospasm, now on glipizide.  Patient reports for about the last several days she can feeling tired, but over the last 3 days she began experiencing nausea and for feeling like she wants to vomit, what feels like a slightly full stomach, and feeling feverish, achy and generally weak. She still has a slight persistent cough, but no wheezing and denies feeling short of breath or have any chest pain. No headache. No rash. She does report her abdomen just feels very achy, she having off-and-on pain has had several loose stools daily for the last 3-4 days.  History of Crohn's disease, also history of prior bowel obstructions with surgeries by Dr. Tollie Pizza. At present reports no pain, but reports comes and goes in waves especially worse when she tries to eat she is barely been able to keep anything down for the last 3 days because of no appetite but denies vomiting.  Her blood sugar was noted to be over 500 at home this afternoon, but reports that when she came here blood sugar had improved and she had not taken any medication in the interim except for her glipizide which she took this morning   Past Medical History:  Diagnosis Date  . Collagen vascular disease ([place])   . Crohn disease ([place])   . Diabetes mellitus without complication ([place])   . Thyroid disease     Patient Active Problem List   Diagnosis Date Noted  . Abdominal pain 05/18/2016  . Acute bronchitis 05/01/2016  . Diastasis recti 10/29/2015  . Rectal bleeding 06/10/2014    . AP (abdominal pain) 12/29/2013  . Crohn's disease ([place]) 12/18/2013    Past Surgical History:  Procedure Laterality Date  . ABDOMINAL HYSTERECTOMY  1987  . APPENDECTOMY    . BREAST BIOPSY Bilateral    neg  . COLON SURGERY  06/06/2011   Resection of terminal ileum and previous ileocolonic anastomosis for recurrent Crohn's disease. 3 focal strictures identified.  . COLONOSCOPY  2013  . SMALL INTESTINE SURGERY  2012  . THYROID SURGERY  1992    Prior to Admission medications   Medication Sig Start Date End Date Taking? Authorizing Provider  acetaminophen (TYLENOL) 325 MG tablet Take 2 tablets (650 mg total) by mouth every 6 (six) hours as needed for mild pain (or Fever >/= 101). 05/04/16  Yes Nicholes Mango, MD  albuterol (PROVENTIL HFA;VENTOLIN HFA) 108 (90 Base) MCG/ACT inhaler Inhale 2 puffs into the lungs every 6 (six) hours as needed for wheezing or shortness of breath. 05/04/16  Yes Nicholes Mango, MD  cholestyramine Lucrezia Starch) 4 G packet Take 4 g by mouth 2 (two) times daily as needed (stomach problems).    Yes Historical Provider, MD  colestipol (COLESTID) 1 G tablet Take 1 g by mouth daily as needed (for diarrhea).    Yes Historical Provider, MD  cyanocobalamin (,VITAMIN B-12,) 1000 MCG/ML injection Inject 1,000 mcg into the muscle every 30 (thirty) days.   Yes Historical Provider, MD  glipiZIDE (GLUCOTROL XL) 5 MG 24 hr tablet Take 1 tablet by mouth daily. 05/09/16  Yes Historical Provider,  MD  HUMIRA PEN 40 MG/0.8ML PNKT Inject 40 mg into the skin every 14 (fourteen) days.  09/08/13  Yes Historical Provider, MD  Multiple Vitamin (MULTIVITAMIN) tablet Take 1 tablet by mouth daily.   Yes Historical Provider, MD  Omega-3 Fatty Acids (FISH OIL) 1000 MG CAPS Take 1,000 mg by mouth daily.    Yes Historical Provider, MD  potassium chloride (K-DUR,KLOR-CON) 10 MEQ tablet Take 4 tablets by mouth daily. For 7 days 05/17/16  Yes Historical Provider, MD  SYNTHROID 100 MCG tablet Take 100 mcg by mouth  daily before breakfast.  04/27/14  Yes Historical Provider, MD  thyroid (ARMOUR) 30 MG tablet Take 30 mg by mouth every morning.   Yes Historical Provider, MD  TURMERIC CURCUMIN PO Take 1 capsule by mouth daily.   Yes Historical Provider, MD  doxycycline (VIBRA-TABS) 100 MG tablet Take 1 tablet (100 mg total) by mouth every 12 (twelve) hours. Patient not taking: Reported on 05/17/2016 05/04/16   Nicholes Mango, MD  predniSONE (STERAPRED UNI-PAK 21 TAB) 10 MG (21) TBPK tablet Take 1 tablet (10 mg total) by mouth daily. Take 6 tablets by mouth for 1 day followed by  5 tablets by mouth for 1 day followed by  4 tablets by mouth for 1 day followed by  3 tablets by mouth for 1 day followed by  2 tablets by mouth for 1 day followed by  1 tablet by mouth for a day and stop Patient not taking: Reported on 05/17/2016 05/04/16   Nicholes Mango, MD  traMADol (ULTRAM) 50 MG tablet Take 1 tablet (50 mg total) by mouth every 6 (six) hours as needed. Patient not taking: Reported on 05/01/2016 03/12/16   Daymon Larsen, MD    Allergies Erythromycin; Hydromorphone; Cefuroxime; Ciprocinonide [fluocinolone]; Codeine; Floxin [ofloxacin]; Keflex [cephalexin]; Levofloxacin; Sulfa antibiotics; and Ciprofloxacin  Family History  Problem Relation Age of Onset  . Breast cancer Sister 85    Social History Social History  Substance Use Topics  . Smoking status: Former Smoker    Packs/day: 1.00    Years: 12.00    Types: Cigarettes  . Smokeless tobacco: Never Used  . Alcohol use No    Review of Systems Constitutional: Fevers and chills Eyes: No visual changes. ENT: No sore throat. Mouth feels very dry Cardiovascular: Denies chest pain. Respiratory: Denies shortness of breath. I'm going try but improved cough Gastrointestinal: See history of present illness  No constipation. Genitourinary: Negative for dysuria. Musculoskeletal: Negative for back pain. Skin: Negative for rash. Neurological: Negative for headaches,  focal weakness or numbness.  10-point ROS otherwise negative.  ____________________________________________   PHYSICAL EXAM:  VITAL SIGNS: ED Triage Vitals  Enc Vitals Group     BP 05/17/16 2157 122/62     Pulse Rate 05/17/16 2157 (!) 135     Resp 05/17/16 2157 20     Temp 05/17/16 2157 (!) 100.8 F (38.2 C)     Temp Source 05/17/16 2157 Oral     SpO2 05/17/16 2157 97 %     Weight 05/17/16 2158 174 lb (78.9 kg)     Height 05/17/16 2158 5\' 8"  (1.727 m)     Head Circumference --      Peak Flow --      Pain Score 05/17/16 2157 4     Pain Loc --      Pain Edu? --      Excl. in [place]? --     Constitutional: Alert and oriented. Mildly ill-appearing, appears  fatigued but very pleasant and in no distress accompanied by her husband Eyes: Conjunctivae are normal. PERRL. EOMI. Head: Atraumatic. Nose: No congestion/rhinnorhea. Mouth/Throat: Mucous membranes are very dry.  Oropharynx non-erythematous. Neck: No stridor.  No meningismus Cardiovascular: Tachycardic rate, regular rhythm. Grossly normal heart sounds.  Good peripheral circulation. Respiratory: Normal respiratory effort.  No retractions. Lungs CTAB. No wheezing. Speaks in full and clear sentences Gastrointestinal: Soft and mildly tender, slightly distended without rebound or guarding. No focal discomfort. No protruding hernias. No CVA tenderness Musculoskeletal: No lower extremity tenderness nor edema.  No joint effusions. Neurologic:  Normal speech and language. No gross focal neurologic deficits are appreciated. Skin:  Skin is hot to touch dry and intact. No rash noted. Psychiatric: Mood and affect are normal. Speech and behavior are normal.  ____________________________________________   LABS (all labs ordered are listed, but only abnormal results are displayed)  Labs Reviewed  GLUCOSE, CAPILLARY - Abnormal; Notable for the following:       Result Value   Glucose-Capillary 149 (*)    All other components within normal  limits  BASIC METABOLIC PANEL - Abnormal; Notable for the following:    Sodium 133 (*)    Potassium 2.5 (*)    CO2 21 (*)    Glucose, Bld 154 (*)    Calcium 8.1 (*)    All other components within normal limits  CBC - Abnormal; Notable for the following:    RBC 3.64 (*)    Hemoglobin 10.2 (*)    HCT 29.9 (*)    RDW 16.9 (*)    All other components within normal limits  URINALYSIS, COMPLETE (UACMP) WITH MICROSCOPIC - Abnormal; Notable for the following:    Color, Urine YELLOW (*)    APPearance CLEAR (*)    Specific Gravity, Urine 1.004 (*)    Hgb urine dipstick SMALL (*)    Squamous Epithelial / LPF 0-5 (*)    All other components within normal limits  HEPATIC FUNCTION PANEL - Abnormal; Notable for the following:    Albumin 2.8 (*)    All other components within normal limits  MAGNESIUM - Abnormal; Notable for the following:    Magnesium 1.4 (*)    All other components within normal limits  DIFFERENTIAL - Abnormal; Notable for the following:    Neutro Abs 6.9 (*)    Monocytes Absolute 1.1 (*)    All other components within normal limits  CBC - Abnormal; Notable for the following:    RBC 3.13 (*)    Hemoglobin 8.9 (*)    HCT 26.0 (*)    RDW 16.9 (*)    All other components within normal limits  COMPREHENSIVE METABOLIC PANEL - Abnormal; Notable for the following:    Potassium 2.7 (*)    Chloride 112 (*)    Glucose, Bld 153 (*)    BUN 5 (*)    Calcium 7.6 (*)    Total Protein 5.4 (*)    Albumin 2.5 (*)    AST 10 (*)    ALT 13 (*)    Anion gap 4 (*)    All other components within normal limits  GLUCOSE, CAPILLARY - Abnormal; Notable for the following:    Glucose-Capillary 151 (*)    All other components within normal limits  COMPREHENSIVE METABOLIC PANEL - Abnormal; Notable for the following:    Chloride 112 (*)    Glucose, Bld 106 (*)    BUN <5 (*)    Calcium 7.6 (*)  Total Protein 5.9 (*)    Albumin 2.6 (*)    AST 13 (*)    Anion gap 3 (*)    All other  components within normal limits  GLUCOSE, CAPILLARY - Abnormal; Notable for the following:    Glucose-Capillary 121 (*)    All other components within normal limits  CULTURE, BLOOD (ROUTINE X 2)  CULTURE, BLOOD (ROUTINE X 2)  C DIFFICILE QUICK SCREEN W PCR REFLEX  GASTROINTESTINAL PANEL BY PCR, STOOL (REPLACES STOOL CULTURE)  URINE CULTURE  LIPASE, BLOOD  LACTIC ACID, PLASMA  MAGNESIUM  PHOSPHORUS  C-REACTIVE PROTEIN  CBG MONITORING, ED  CBG MONITORING, ED   ____________________________________________  EKG  Reviewed and interpreted by me at 2215 Heart rate 120 QRS 120 QTc 440 Sinus tachycardia, probable left ventricular hypertrophy without ischemic changes noted ____________________________________________  RADIOLOGY  Dg Chest 2 View  Result Date: 05/17/2016 CLINICAL DATA:  Initial evaluation for acute fever, weakness. EXAM: CHEST  2 VIEW COMPARISON:  Prior radiograph from 05/01/2016. FINDINGS: Cardiac and mediastinal silhouettes are stable in size and contour, and remain within normal limits. Lungs normally inflated. Mild left basilar atelectasis. No consolidative airspace disease. No pulmonary edema or pleural effusion. No pneumothorax. No acute osseus abnormality.  Left shoulder arthroplasty noted. IMPRESSION: Mild left basilar atelectasis. No other active cardiopulmonary disease. Electronically Signed   By: Jeannine Boga M.D.   On: 05/17/2016 23:05   Ct Abdomen Pelvis W Contrast  Result Date: 05/18/2016 CLINICAL DATA:  Acute onset of generalized abdominal discomfort. Hyperglycemia. Initial encounter. EXAM: CT ABDOMEN AND PELVIS WITH CONTRAST TECHNIQUE: Multidetector CT imaging of the abdomen and pelvis was performed using the standard protocol following bolus administration of intravenous contrast. CONTRAST:  131mL ISOVUE-300 IOPAMIDOL (ISOVUE-300) INJECTION 61% COMPARISON:  Right upper quadrant ultrasound performed 02/23/2011, and CT of the abdomen and pelvis from  12/22/2013 FINDINGS: Lower chest: Diffuse coronary artery calcifications are seen. The visualized lung bases are clear. Hepatobiliary: The liver is unremarkable in appearance. A few stones are noted within the gallbladder. The gallbladder is otherwise unremarkable. The common bile duct remains normal in caliber. Pancreas: The pancreas is within normal limits. Spleen: The spleen is unremarkable in appearance. Adrenals/Urinary Tract: The adrenal glands are unremarkable in appearance. The kidneys are within normal limits. There is no evidence of hydronephrosis. No renal or ureteral stones are identified. No perinephric stranding is seen. Stomach/Bowel: Multiple vague multilobulated soft tissue masses are seen tracking about the small bowel mesentery, measuring up to 2.7 cm in size, extending directly adjacent to small bowel loops. Surrounding soft tissue inflammation is seen, and mildly prominent mesenteric nodes are noted more proximally. These findings are new from 2015. This raises concern for metastatic disease, though desmoid tumor, sclerosing mesenteritis or other inflammatory conditions might have a similar appearance. An ileocolic anastomosis is grossly unremarkable in appearance. The remaining colon is grossly unremarkable in appearance. The colon is grossly unremarkable in appearance. Vascular/Lymphatic: Scattered calcification is seen along the abdominal aorta and its branches. The abdominal aorta is otherwise grossly unremarkable. The inferior vena cava is grossly unremarkable. No retroperitoneal lymphadenopathy is seen. No pelvic sidewall lymphadenopathy is identified. Reproductive: The bladder is moderately distended and grossly unremarkable. The patient is status post hysterectomy. No suspicious adnexal masses are seen. Other: No additional soft tissue abnormalities are seen. Musculoskeletal: No acute osseous abnormalities are identified. Vacuum phenomenon is noted at L4-L5. The visualized musculature is  unremarkable in appearance. IMPRESSION: 1. Multiple vague multilobulated soft tissue masses tracking about the small  bowel mesentery, measuring up to 2.7 cm in size, extending directly adjacent to multiple small bowel loops. Surrounding soft tissue inflammation seen, with mildly prominent mesenteric nodes more proximally. These findings are new from 2015. This raises concern for metastatic disease, though desmoid tumor, sclerosing mesenteritis or other inflammatory conditions might have a similar appearance, given the patient's history of Crohn's disease. PET/CT could be performed for further evaluation. 2. Diffuse coronary artery calcifications seen. 3. Cholelithiasis.  Gallbladder otherwise unremarkable. 4. Scattered aortic atherosclerosis. Electronically Signed   By: Garald Balding M.D.   On: 05/18/2016 02:50   Dg Abd 2 Views  Result Date: 05/17/2016 CLINICAL DATA:  Initial evaluation for acute abdominal pain. History of prior SP of. EXAM: ABDOMEN - 2 VIEW COMPARISON:  Prior CT from 12/22/2013. FINDINGS: Multiple prominent air and fluid-filled loops of bowel seen scattered throughout the abdomen. Air-fluid level seen on upright projection. Gas is present within the colon. No free air. No soft tissue mass or abnormal calcification. Suture material overlies the right abdomen. No acute osseus abnormality. IMPRESSION: Multiple prominent loops of small bowel scattered throughout the abdomen with air-fluid levels on upright projection. Finding somewhat suspicious for possible small bowel obstruction. Follow-up exam with cross-sectional imaging could be performed for further evaluation as warranted. Electronically Signed   By: Jeannine Boga M.D.   On: 05/17/2016 23:07    ____________________________________________   PROCEDURES  Procedure(s) performed: None  Procedures  Critical Care performed: No  ____________________________________________   INITIAL IMPRESSION / ASSESSMENT AND PLAN / ED  COURSE  Pertinent labs & imaging results that were available during my care of the patient were reviewed by me and considered in my medical decision making (see chart for details).  Patient presents for abdominal pain. CT scan ordered and pending at time of sign out to Dr. Corky Downs. Appreciate consultation from surgery  Clinical Course as of May 19 1514  Fri May 18, 2016  0005 Dr. Tollie Pizza performing consult.  [MQ]    Clinical Course User Index [MQ] Delman Kitten, MD     ____________________________________________   FINAL CLINICAL IMPRESSION(S) / ED DIAGNOSES  Final diagnoses:  Fever  Hypokalemia  Anorexia  Generalized abdominal pain  Fever, unspecified fever cause      NEW MEDICATIONS STARTED DURING THIS VISIT:  Current Discharge Medication List       Note:  This document was prepared using Dragon voice recognition software and may include unintentional dictation errors.     Delman Kitten, MD 05/28/16 [phone]

## 2016-05-18 ENCOUNTER — Emergency Department: Payer: Federal, State, Local not specified - PPO

## 2016-05-18 DIAGNOSIS — Z881 Allergy status to other antibiotic agents status: Secondary | ICD-10-CM | POA: Diagnosis not present

## 2016-05-18 DIAGNOSIS — E785 Hyperlipidemia, unspecified: Secondary | ICD-10-CM | POA: Diagnosis present

## 2016-05-18 DIAGNOSIS — Z87891 Personal history of nicotine dependence: Secondary | ICD-10-CM | POA: Diagnosis not present

## 2016-05-18 DIAGNOSIS — K50918 Crohn's disease, unspecified, with other complication: Secondary | ICD-10-CM | POA: Diagnosis present

## 2016-05-18 DIAGNOSIS — I998 Other disorder of circulatory system: Secondary | ICD-10-CM | POA: Diagnosis present

## 2016-05-18 DIAGNOSIS — Z79899 Other long term (current) drug therapy: Secondary | ICD-10-CM | POA: Diagnosis not present

## 2016-05-18 DIAGNOSIS — R109 Unspecified abdominal pain: Secondary | ICD-10-CM | POA: Diagnosis present

## 2016-05-18 DIAGNOSIS — Z885 Allergy status to narcotic agent status: Secondary | ICD-10-CM | POA: Diagnosis not present

## 2016-05-18 DIAGNOSIS — K5710 Diverticulosis of small intestine without perforation or abscess without bleeding: Secondary | ICD-10-CM | POA: Diagnosis present

## 2016-05-18 DIAGNOSIS — K9049 Malabsorption due to intolerance, not elsewhere classified: Secondary | ICD-10-CM | POA: Diagnosis present

## 2016-05-18 DIAGNOSIS — R630 Anorexia: Secondary | ICD-10-CM | POA: Diagnosis present

## 2016-05-18 DIAGNOSIS — E876 Hypokalemia: Secondary | ICD-10-CM | POA: Diagnosis present

## 2016-05-18 DIAGNOSIS — Z803 Family history of malignant neoplasm of breast: Secondary | ICD-10-CM | POA: Diagnosis not present

## 2016-05-18 DIAGNOSIS — R1084 Generalized abdominal pain: Secondary | ICD-10-CM | POA: Diagnosis not present

## 2016-05-18 DIAGNOSIS — Z882 Allergy status to sulfonamides status: Secondary | ICD-10-CM | POA: Diagnosis not present

## 2016-05-18 DIAGNOSIS — D510 Vitamin B12 deficiency anemia due to intrinsic factor deficiency: Secondary | ICD-10-CM | POA: Diagnosis present

## 2016-05-18 DIAGNOSIS — K909 Intestinal malabsorption, unspecified: Secondary | ICD-10-CM | POA: Diagnosis present

## 2016-05-18 DIAGNOSIS — Z888 Allergy status to other drugs, medicaments and biological substances status: Secondary | ICD-10-CM | POA: Diagnosis not present

## 2016-05-18 DIAGNOSIS — Z90710 Acquired absence of both cervix and uterus: Secondary | ICD-10-CM | POA: Diagnosis not present

## 2016-05-18 DIAGNOSIS — E039 Hypothyroidism, unspecified: Secondary | ICD-10-CM | POA: Diagnosis present

## 2016-05-18 DIAGNOSIS — E1165 Type 2 diabetes mellitus with hyperglycemia: Secondary | ICD-10-CM | POA: Diagnosis present

## 2016-05-18 DIAGNOSIS — Z8249 Family history of ischemic heart disease and other diseases of the circulatory system: Secondary | ICD-10-CM | POA: Diagnosis not present

## 2016-05-18 DIAGNOSIS — M069 Rheumatoid arthritis, unspecified: Secondary | ICD-10-CM | POA: Diagnosis present

## 2016-05-18 DIAGNOSIS — Z8711 Personal history of peptic ulcer disease: Secondary | ICD-10-CM | POA: Diagnosis not present

## 2016-05-18 DIAGNOSIS — Z7984 Long term (current) use of oral hypoglycemic drugs: Secondary | ICD-10-CM | POA: Diagnosis not present

## 2016-05-18 DIAGNOSIS — M459 Ankylosing spondylitis of unspecified sites in spine: Secondary | ICD-10-CM | POA: Diagnosis present

## 2016-05-18 LAB — COMPREHENSIVE METABOLIC PANEL
ALBUMIN: 2.5 g/dL — AB (ref 3.5–5.0)
ALBUMIN: 2.6 g/dL — AB (ref 3.5–5.0)
ALK PHOS: 40 U/L (ref 38–126)
ALK PHOS: 44 U/L (ref 38–126)
ALK PHOS: 43 U/L (ref 38–126)
ALT: 13 U/L — ABNORMAL LOW (ref 14–54)
ALT: 14 U/L (ref 14–54)
ALT: 14 U/L (ref 14–54)
ANION GAP: 4 — AB (ref 5–15)
ANION GAP: 3 — AB (ref 5–15)
ANION GAP: 5 (ref 5–15)
AST: 10 U/L — ABNORMAL LOW (ref 15–41)
AST: 13 U/L — ABNORMAL LOW (ref 15–41)
AST: 13 U/L — ABNORMAL LOW (ref 15–41)
Albumin: 2.6 g/dL — ABNORMAL LOW (ref 3.5–5.0)
BILIRUBIN TOTAL: 0.7 mg/dL (ref 0.3–1.2)
BILIRUBIN TOTAL: 0.6 mg/dL (ref 0.3–1.2)
BUN: 5 mg/dL — ABNORMAL LOW (ref 6–20)
BUN: <5 mg/dL — ABNORMAL LOW (ref 6–20)
CALCIUM: 7.6 mg/dL — AB (ref 8.9–10.3)
CALCIUM: 7.6 mg/dL — AB (ref 8.9–10.3)
CALCIUM: 7.5 mg/dL — AB (ref 8.9–10.3)
CO2: 23 mmol/L (ref 22–32)
CO2: 24 mmol/L (ref 22–32)
CO2: 23 mmol/L (ref 22–32)
CREATININE: 0.61 mg/dL (ref 0.44–1.00)
CREATININE: 0.6 mg/dL (ref 0.44–1.00)
Chloride: 112 mmol/L — ABNORMAL HIGH (ref 101–111)
Chloride: 112 mmol/L — ABNORMAL HIGH (ref 101–111)
Chloride: 111 mmol/L (ref 101–111)
Creatinine, Ser: 0.63 mg/dL (ref 0.44–1.00)
GFR calc non Af Amer: >60 mL/min (ref >60)
GFR calc non Af Amer: >60 mL/min (ref >60)
GLUCOSE: 142 mg/dL — AB (ref 65–99)
Glucose, Bld: 153 mg/dL — ABNORMAL HIGH (ref 65–99)
Glucose, Bld: 106 mg/dL — ABNORMAL HIGH (ref 65–99)
POTASSIUM: 2.7 mmol/L — AB (ref 3.5–5.1)
Potassium: 3.7 mmol/L (ref 3.5–5.1)
Potassium: 2.9 mmol/L — ABNORMAL LOW (ref 3.5–5.1)
SODIUM: 139 mmol/L (ref 135–145)
SODIUM: 139 mmol/L (ref 135–145)
Sodium: 139 mmol/L (ref 135–145)
TOTAL PROTEIN: 5.4 g/dL — AB (ref 6.5–8.1)
TOTAL PROTEIN: 5.8 g/dL — AB (ref 6.5–8.1)
Total Bilirubin: 0.5 mg/dL (ref 0.3–1.2)
Total Protein: 5.9 g/dL — ABNORMAL LOW (ref 6.5–8.1)

## 2016-05-18 LAB — PHOSPHORUS: PHOSPHORUS: 3.6 mg/dL (ref 2.5–4.6)

## 2016-05-18 LAB — DIFFERENTIAL
BASOS PCT: 0 %
Basophils Absolute: 0 10*3/uL (ref 0–0.1)
EOS ABS: 0 10*3/uL (ref 0–0.7)
Eosinophils Relative: 0 %
LYMPHS ABS: 1.5 10*3/uL (ref 1.0–3.6)
Lymphocytes Relative: 16 %
MONO ABS: 1.1 10*3/uL — AB (ref 0.2–0.9)
MONOS PCT: 12 %
Neutro Abs: 6.9 10*3/uL — ABNORMAL HIGH (ref 1.4–6.5)
Neutrophils Relative %: 72 %

## 2016-05-18 LAB — GLUCOSE, CAPILLARY
GLUCOSE-CAPILLARY: 121 mg/dL — AB (ref 65–99)
Glucose-Capillary: 151 mg/dL — ABNORMAL HIGH (ref 65–99)
Glucose-Capillary: 88 mg/dL (ref 65–99)
Glucose-Capillary: 136 mg/dL — ABNORMAL HIGH (ref 65–99)

## 2016-05-18 LAB — C DIFFICILE QUICK SCREEN W PCR REFLEX
C DIFFICILE (CDIFF) TOXIN: NEGATIVE
C DIFFICLE (CDIFF) ANTIGEN: NEGATIVE
C Diff interpretation: NOT DETECTED

## 2016-05-18 LAB — CBC
HCT: 26 % — ABNORMAL LOW (ref 35.0–47.0)
HEMOGLOBIN: 8.9 g/dL — AB (ref 12.0–16.0)
MCH: 28.6 pg (ref 26.0–34.0)
MCHC: 34.4 g/dL (ref 32.0–36.0)
MCV: 83.3 fL (ref 80.0–100.0)
Platelets: 156 10*3/uL (ref 150–440)
RBC: 3.13 MIL/uL — ABNORMAL LOW (ref 3.80–5.20)
RDW: 16.9 % — ABNORMAL HIGH (ref 11.5–14.5)
WBC: 6 10*3/uL (ref 3.6–11.0)

## 2016-05-18 LAB — GASTROINTESTINAL PANEL BY PCR, STOOL (REPLACES STOOL CULTURE)
ADENOVIRUS F40/41: NOT DETECTED
ASTROVIRUS: NOT DETECTED
CAMPYLOBACTER SPECIES: NOT DETECTED
CYCLOSPORA CAYETANENSIS: NOT DETECTED
Cryptosporidium: NOT DETECTED
ENTEROAGGREGATIVE E COLI (EAEC): NOT DETECTED
ENTEROPATHOGENIC E COLI (EPEC): NOT DETECTED
ENTEROTOXIGENIC E COLI (ETEC): NOT DETECTED
Entamoeba histolytica: NOT DETECTED
GIARDIA LAMBLIA: NOT DETECTED
Norovirus GI/GII: NOT DETECTED
PLESIMONAS SHIGELLOIDES: NOT DETECTED
ROTAVIRUS A: NOT DETECTED
Salmonella species: NOT DETECTED
Sapovirus (I, II, IV, and V): NOT DETECTED
Shiga like toxin producing E coli (STEC): NOT DETECTED
Shigella/Enteroinvasive E coli (EIEC): NOT DETECTED
VIBRIO SPECIES: NOT DETECTED
Vibrio cholerae: NOT DETECTED
Yersinia enterocolitica: NOT DETECTED

## 2016-05-18 LAB — MAGNESIUM: MAGNESIUM: 1.9 mg/dL (ref 1.7–2.4)

## 2016-05-18 LAB — LACTIC ACID, PLASMA: Lactic Acid, Venous: 0.9 mmol/L (ref 0.5–1.9)

## 2016-05-18 MED ORDER — ALBUTEROL SULFATE (2.5 MG/3ML) 0.083% IN NEBU: 2.5000 mg | INHALATION_SOLUTION | Freq: Four times a day (QID) | RESPIRATORY_TRACT | Status: DC | PRN

## 2016-05-18 MED ORDER — POTASSIUM CHLORIDE CRYS ER 20 MEQ PO TBCR
40.0000 meq | EXTENDED_RELEASE_TABLET | Freq: Every day | ORAL | Status: DC
  Administered 2016-05-18 – 2016-05-21 (×4): 40 meq via ORAL
  Filled 2016-05-18 (×4): qty 2

## 2016-05-18 MED ORDER — SENNOSIDES-DOCUSATE SODIUM 8.6-50 MG PO TABS: 1.0000 | ORAL_TABLET | Freq: Every evening | ORAL | Status: DC | PRN

## 2016-05-18 MED ORDER — POTASSIUM CHLORIDE 2 MEQ/ML IV SOLN
30.0000 meq | Freq: Once | INTRAVENOUS | Status: AC
  Administered 2016-05-18: 30 meq via INTRAVENOUS
  Filled 2016-05-18: qty 15

## 2016-05-18 MED ORDER — INSULIN ASPART 100 UNIT/ML ~~LOC~~ SOLN
0.0000 [IU] | Freq: Three times a day (TID) | SUBCUTANEOUS | Status: DC
  Administered 2016-05-18: 3 [IU] via SUBCUTANEOUS
  Administered 2016-05-18 – 2016-05-20 (×4): 2 [IU] via SUBCUTANEOUS
  Filled 2016-05-18 (×5): qty 2

## 2016-05-18 MED ORDER — LEVOTHYROXINE SODIUM 50 MCG PO TABS
100.0000 ug | ORAL_TABLET | Freq: Every day | ORAL | Status: DC
  Administered 2016-05-18 – 2016-05-21 (×4): 100 ug via ORAL
  Filled 2016-05-18: qty 1
  Filled 2016-05-18 (×5): qty 2

## 2016-05-18 MED ORDER — IPRATROPIUM BROMIDE 0.02 % IN SOLN: 0.5000 mg | Freq: Four times a day (QID) | RESPIRATORY_TRACT | Status: DC | PRN

## 2016-05-18 MED ORDER — BISACODYL 5 MG PO TBEC: 5.0000 mg | DELAYED_RELEASE_TABLET | Freq: Every day | ORAL | Status: DC | PRN

## 2016-05-18 MED ORDER — IOPAMIDOL (ISOVUE-300) INJECTION 61%
100.0000 mL | Freq: Once | INTRAVENOUS | Status: AC | PRN
  Administered 2016-05-18: 100 mL via INTRAVENOUS

## 2016-05-18 MED ORDER — ONDANSETRON HCL 4 MG PO TABS: 4.0000 mg | ORAL_TABLET | Freq: Four times a day (QID) | ORAL | Status: DC | PRN

## 2016-05-18 MED ORDER — INSULIN ASPART 100 UNIT/ML ~~LOC~~ SOLN: 0.0000 [IU] | Freq: Every day | SUBCUTANEOUS | Status: DC

## 2016-05-18 MED ORDER — THYROID 30 MG PO TABS
30.0000 mg | ORAL_TABLET | ORAL | Status: DC
  Administered 2016-05-18 – 2016-05-21 (×4): 30 mg via ORAL
  Filled 2016-05-18 (×6): qty 1

## 2016-05-18 MED ORDER — SODIUM CHLORIDE 0.9 % IV SOLN
INTRAVENOUS | Status: DC
  Administered 2016-05-18 – 2016-05-21 (×6): via INTRAVENOUS

## 2016-05-18 MED ORDER — ACETAMINOPHEN 325 MG PO TABS
650.0000 mg | ORAL_TABLET | Freq: Four times a day (QID) | ORAL | Status: DC | PRN
Start: 2016-05-18 — End: 2016-05-21
  Administered 2016-05-18 – 2016-05-21 (×6): 650 mg via ORAL
  Filled 2016-05-18 (×6): qty 2

## 2016-05-18 MED ORDER — ONDANSETRON HCL 4 MG/2ML IJ SOLN
4.0000 mg | Freq: Four times a day (QID) | INTRAMUSCULAR | Status: DC | PRN
  Administered 2016-05-19: 4 mg via INTRAVENOUS
  Filled 2016-05-18: qty 2

## 2016-05-18 MED ORDER — IOPAMIDOL (ISOVUE-300) INJECTION 61%
30.0000 mL | Freq: Once | INTRAVENOUS | Status: AC | PRN
  Administered 2016-05-18: 30 mL via ORAL

## 2016-05-18 MED ORDER — HEPARIN SODIUM (PORCINE) 5000 UNIT/ML IJ SOLN
5000.0000 [IU] | Freq: Three times a day (TID) | INTRAMUSCULAR | Status: DC
  Administered 2016-05-18 – 2016-05-21 (×9): 5000 [IU] via SUBCUTANEOUS
  Filled 2016-05-18 (×9): qty 1

## 2016-05-18 MED ORDER — ADULT MULTIVITAMIN W/MINERALS CH
1.0000 | ORAL_TABLET | Freq: Every day | ORAL | Status: DC
  Administered 2016-05-18 – 2016-05-21 (×4): 1 via ORAL
  Filled 2016-05-18 (×7): qty 1

## 2016-05-18 MED ORDER — MAGNESIUM CITRATE PO SOLN
1.0000 | Freq: Once | ORAL | Status: DC | PRN
  Filled 2016-05-18: qty 296

## 2016-05-18 MED ORDER — ACETAMINOPHEN 650 MG RE SUPP: 650.0000 mg | Freq: Four times a day (QID) | RECTAL | Status: DC | PRN

## 2016-05-18 NOTE — Consult Note (Signed)
Reason for Consult: Possible obstruction Referring Physician: Sharyn Creamer, MD  Joanna Ortiz is an 71 y.o. female.  HPI: Patient presents with three days of fullness, abdominal discomfort, worse with oral intake. No change in baseline loose BM's (6-7/ day). No history of vomiting or blood per rectum (as with 2016 episode of lower GI bleeding thought secondary to diverticular source). Felt "shaky" on March 28 when she was seen by PMD. Came tonight as her home blood sugar monitor read 500.  Poor oral intake for several days.  Patient reports since July 2017 she has not felt her best unless she was taking high dose steroids. MRI with her GI PA from [facility] showed small focal areas of active disease, but no evidence of obstruction.  Treated with steroids in February 2018 for suspected flare with improvement in sense of wellbeing.  Returned to fall 2017 baseline when that long taper expired.  Treated again with steroids for pulmonary symptoms in March 2018 by local PCP for vial bronchitis. Of note, patient had C diff in July 2017.   Past Medical History:  Diagnosis Date  . Crohn disease (HCC)   . Thyroid disease     Past Surgical History:  Procedure Laterality Date  . ABDOMINAL HYSTERECTOMY  1987  . APPENDECTOMY    . BREAST BIOPSY Bilateral    neg  . COLON SURGERY  06/06/2011   Resection of terminal ileum and previous ileocolonic anastomosis for recurrent Crohn's disease. 3 focal strictures identified.  . COLONOSCOPY  2013  . SMALL INTESTINE SURGERY  2012  . THYROID SURGERY  1992    Family History  Problem Relation Age of Onset  . Breast cancer Sister 69    Social History:  reports that she has quit smoking. Her smoking use included Cigarettes. She has a 12.00 pack-year smoking history. She has never used smokeless tobacco. She reports that she does not drink alcohol or use drugs.  Allergies:  Allergies  Allergen Reactions  . Erythromycin Hives  . Hydromorphone Hives  . Cefuroxime  Nausea And Vomiting  . Ciprocinonide [Fluocinolone]     Muscle pain   . Codeine     Other reaction(s): Vomiting  . Floxin [Ofloxacin] Nausea And Vomiting  . Keflex [Cephalexin] Nausea And Vomiting  . Levofloxacin Nausea And Vomiting  . Sulfa Antibiotics Nausea And Vomiting  . Ciprofloxacin Nausea And Vomiting    Medications: I have reviewed the patient's current medications.  Results for orders placed or performed during the hospital encounter of 05/17/16 (from the past 48 hour(s))  Glucose, capillary     Status: Abnormal   Collection Time: 05/17/16 10:15 PM  Result Value Ref Range   Glucose-Capillary 149 (H) 65 - 99 mg/dL   Comment 1 Notify RN    Comment 2 Document in Chart   Basic metabolic panel     Status: Abnormal   Collection Time: 05/17/16 10:19 PM  Result Value Ref Range   Sodium 133 (L) 135 - 145 mmol/L   Potassium 2.5 (LL) 3.5 - 5.1 mmol/L    Comment: CRITICAL RESULT CALLED TO, READ BACK BY AND VERIFIED WITH KENDAL MOFFITT @ 2250 ON 05/17/2016 BY CAF    Chloride 104 101 - 111 mmol/L   CO2 21 (L) 22 - 32 mmol/L   Glucose, Bld 154 (H) 65 - 99 mg/dL   BUN 7 6 - 20 mg/dL   Creatinine, Ser 2.07 0.44 - 1.00 mg/dL   Calcium 8.1 (L) 8.9 - 10.3 mg/dL   GFR  calc non Af Amer >60 >60 mL/min   GFR calc Af Amer >60 >60 mL/min    Comment: (NOTE) The eGFR has been calculated using the CKD EPI equation. This calculation has not been validated in all clinical situations. eGFR's persistently <60 mL/min signify possible Chronic Kidney Disease.    Anion gap 8 5 - 15  CBC     Status: Abnormal   Collection Time: 05/17/16 10:19 PM  Result Value Ref Range   WBC 9.8 3.6 - 11.0 K/uL   RBC 3.64 (L) 3.80 - 5.20 MIL/uL   Hemoglobin 10.2 (L) 12.0 - 16.0 g/dL   HCT 12.7 (L) 37.5 - 53.6 %   MCV 82.1 80.0 - 100.0 fL   MCH 28.0 26.0 - 34.0 pg   MCHC 34.1 32.0 - 36.0 g/dL   RDW 31.8 (H) 66.6 - 28.8 %   Platelets 204 150 - 440 K/uL  Hepatic function panel     Status: Abnormal    Collection Time: 05/17/16 10:20 PM  Result Value Ref Range   Total Protein 6.5 6.5 - 8.1 g/dL   Albumin 2.8 (L) 3.5 - 5.0 g/dL   AST 15 15 - 41 U/L   ALT 15 14 - 54 U/L   Alkaline Phosphatase 51 38 - 126 U/L   Total Bilirubin 0.8 0.3 - 1.2 mg/dL   Bilirubin, Direct 0.1 0.1 - 0.5 mg/dL   Indirect Bilirubin 0.7 0.3 - 0.9 mg/dL  Lipase, blood     Status: None   Collection Time: 05/17/16 10:20 PM  Result Value Ref Range   Lipase 13 11 - 51 U/L  Magnesium     Status: Abnormal   Collection Time: 05/17/16 10:20 PM  Result Value Ref Range   Magnesium 1.4 (L) 1.7 - 2.4 mg/dL  Urinalysis, Complete w Microscopic     Status: Abnormal   Collection Time: 05/17/16 11:10 PM  Result Value Ref Range   Color, Urine YELLOW (A) YELLOW   APPearance CLEAR (A) CLEAR   Specific Gravity, Urine 1.004 (L) 1.005 - 1.030   pH 6.0 5.0 - 8.0   Glucose, UA NEGATIVE NEGATIVE mg/dL   Hgb urine dipstick SMALL (A) NEGATIVE   Bilirubin Urine NEGATIVE NEGATIVE   Ketones, ur NEGATIVE NEGATIVE mg/dL   Protein, ur NEGATIVE NEGATIVE mg/dL   Nitrite NEGATIVE NEGATIVE   Leukocytes, UA NEGATIVE NEGATIVE   RBC / HPF 0-5 0 - 5 RBC/hpf   WBC, UA 0-5 0 - 5 WBC/hpf   Bacteria, UA NONE SEEN NONE SEEN   Squamous Epithelial / LPF 0-5 (A) NONE SEEN    Dg Chest 2 View  Result Date: 05/17/2016 CLINICAL DATA:  Initial evaluation for acute fever, weakness. EXAM: CHEST  2 VIEW COMPARISON:  Prior radiograph from 05/01/2016. FINDINGS: Cardiac and mediastinal silhouettes are stable in size and contour, and remain within normal limits. Lungs normally inflated. Mild left basilar atelectasis. No consolidative airspace disease. No pulmonary edema or pleural effusion. No pneumothorax. No acute osseus abnormality.  Left shoulder arthroplasty noted. IMPRESSION: Mild left basilar atelectasis. No other active cardiopulmonary disease. Electronically Signed   By: Rise Mu M.D.   On: 05/17/2016 23:05   Dg Abd 2 Views  Result Date:  05/17/2016 CLINICAL DATA:  Initial evaluation for acute abdominal pain. History of prior SP of. EXAM: ABDOMEN - 2 VIEW COMPARISON:  Prior CT from 12/22/2013. FINDINGS: Multiple prominent air and fluid-filled loops of bowel seen scattered throughout the abdomen. Air-fluid level seen on upright projection. Gas is present  within the colon. No free air. No soft tissue mass or abnormal calcification. Suture material overlies the right abdomen. No acute osseus abnormality. IMPRESSION: Multiple prominent loops of small bowel scattered throughout the abdomen with air-fluid levels on upright projection. Finding somewhat suspicious for possible small bowel obstruction. Follow-up exam with cross-sectional imaging could be performed for further evaluation as warranted. Electronically Signed   By: Jeannine Boga M.D.   On: 05/17/2016 23:07    ROS Blood pressure 125/71, pulse (!) 121, temperature 98.6 F (37 C), temperature source Oral, resp. rate 18, height '5\' 8"'$  (1.727 m), weight 174 lb (78.9 kg), SpO2 97 %. Physical Exam  Constitutional: She appears well-developed and well-nourished.  HENT:  Head: Normocephalic and atraumatic.  Eyes: Pupils are equal, round, and reactive to light.  Neck: Normal range of motion. Neck supple. No tracheal deviation present. No thyromegaly present.  Cardiovascular: Regular rhythm.  Tachycardia present.   Pulses:      Femoral pulses are 2+ on the right side, and 2+ on the left side. No distal edema.   Respiratory: Effort normal and breath sounds normal. No respiratory distress. She has no wheezes. She has no rales.  GI: Soft. She exhibits no distension and no mass. Bowel sounds are increased. There is no tenderness. There is no rebound and no guarding.    Lymphadenopathy:    She has no cervical adenopathy.       Right: No inguinal adenopathy present.       Left: No inguinal adenopathy present.    Assessment/Plan: Recurrent abdominal symptoms in patient with long  history of Crohn's disease. No obvious obstruction on my review of plain films.  Marked hypokalemia in patient with no reported history of vomiting.  History of abnormal glucose metabolism thought secondary to steroid exposure.  At present, would admit to medicine for management of multiple electrolyte abnormalities.   Even if evidence of obstruction, would RX medically as first step in light of past history of resection x 2.   [name] 05/18/2016, 12:26 AM

## 2016-05-18 NOTE — Progress Notes (Signed)
PT Hold Note  Patient Details Name: IYAH LAGUNA MRN: [id number] DOB: 12-Dec-1945   Hold Treatment:    Reason Eval/Treat Not Completed: Other (comment). Pt currently admitted with abdominal pain, hypokalemia, and acute on chronic hypomagnesemia with diarrhea and decreased PO intake. Potassium remains low at 2.7, up from 2.5 yesterday evening. Pt currently contraindicated for PT evaluation due to significant hypokalemia. Will attempt PT evaluation on later date/time once potassium is in acceptable range and pt is appropriate.   Lyndel Safe Huprich PT, DPT   Huprich,Jason 05/18/2016, 9:31 AM

## 2016-05-18 NOTE — ED Notes (Signed)
CT called and informed the patient has completed her contrast.

## 2016-05-18 NOTE — Progress Notes (Addendum)
[place] [facility] [place] NAME: Joanna Ortiz    MR#:  [id number]  DATE OF BIRTH:  [birth year]  SUBJECTIVE:  CHIEF COMPLAINT:  Patient is reporting diffuse abdominal pain however it's better than yesterday. Tolerating clear liquid diet. No nausea vomiting  REVIEW OF SYSTEMS:  CONSTITUTIONAL: No fever, fatigue or weakness.  EYES: No blurred or double vision.  EARS, NOSE, AND THROAT: No tinnitus or ear pain.  RESPIRATORY: No cough, shortness of breath, wheezing or hemoptysis.  CARDIOVASCULAR: No chest pain, orthopnea, edema.  GASTROINTESTINAL: No nausea, vomiting, diarrhea abd. Reporting diffuse abdominal pain.  GENITOURINARY: No dysuria, hematuria.  ENDOCRINE: No polyuria, nocturia,  HEMATOLOGY: No anemia, easy bruising or bleeding SKIN: No rash or lesion. MUSCULOSKELETAL: No joint pain or arthritis.   NEUROLOGIC: No tingling, numbness, weakness.  PSYCHIATRY: No anxiety or depression.   DRUG ALLERGIES:   Allergies  Allergen Reactions  . Erythromycin Hives  . Hydromorphone Hives  . Cefuroxime Nausea And Vomiting  . Ciprocinonide [Fluocinolone]     Muscle pain   . Codeine     Other reaction(s): Vomiting  . Floxin [Ofloxacin] Nausea And Vomiting  . Keflex [Cephalexin] Nausea And Vomiting  . Levofloxacin Nausea And Vomiting  . Sulfa Antibiotics Nausea And Vomiting  . Ciprofloxacin Nausea And Vomiting    VITALS:  Blood pressure 110/65, pulse 88, temperature 98.1 F (36.7 C), temperature source Oral, resp. rate 18, height 5\' 8"  (1.727 m), weight 78.9 kg (174 lb), SpO2 100 %.  PHYSICAL EXAMINATION:  GENERAL:  71 y.o.-year-old patient lying in the bed with no acute distress.  EYES: Pupils equal, round, reactive to light and accommodation. No scleral icterus. Extraocular muscles intact.  HEENT: Head atraumatic, normocephalic. Oropharynx and nasopharynx clear.  NECK:  Supple, no jugular venous distention. No thyroid enlargement,  no tenderness.  LUNGS: Normal breath sounds bilaterally, no wheezing, rales,rhonchi or crepitation. No use of accessory muscles of respiration.  CARDIOVASCULAR: S1, S2 normal. No murmurs, rubs, or gallops.  ABDOMEN: Soft, Generalized abdominal tenderness, no rebound tenderness, nondistended. Bowel sounds present. No organomegaly or mass.  EXTREMITIES: No pedal edema, cyanosis, or clubbing.  NEUROLOGIC: Cranial nerves II through XII are intact. Muscle strength 5/5 in all extremities. Sensation intact. Gait not checked.  PSYCHIATRIC: The patient is alert and oriented x 3.  SKIN: No obvious rash, lesion, or ulcer.    LABORATORY PANEL:   CBC  Recent Labs Lab 05/18/16 0649  WBC 6.0  HGB 8.9*  HCT 26.0*  PLT 156   ------------------------------------------------------------------------------------------------------------------  Chemistries   Recent Labs Lab 05/18/16 0649 05/18/16 1418  NA 139 139  K 2.7* 3.7  CL 112* 112*  CO2 23 24  GLUCOSE 153* 106*  BUN 5* <5*  CREATININE 0.63 0.61  CALCIUM 7.6* 7.6*  MG 1.9  --   AST 10* 13*  ALT 13* 14  ALKPHOS 40 44  BILITOT 0.7 0.5   ------------------------------------------------------------------------------------------------------------------  Cardiac Enzymes No results for input(s): TROPONINI in the last 168 hours. ------------------------------------------------------------------------------------------------------------------  RADIOLOGY:  Dg Chest 2 View  Result Date: 05/17/2016 CLINICAL DATA:  Initial evaluation for acute fever, weakness. EXAM: CHEST  2 VIEW COMPARISON:  Prior radiograph from 05/01/2016. FINDINGS: Cardiac and mediastinal silhouettes are stable in size and contour, and remain within normal limits. Lungs normally inflated. Mild left basilar atelectasis. No consolidative airspace disease. No pulmonary edema or pleural effusion. No pneumothorax. No acute osseus abnormality.  Left shoulder arthroplasty noted.  IMPRESSION: Mild left basilar atelectasis.  No other active cardiopulmonary disease. Electronically Signed   By: Jeannine Boga M.D.   On: 05/17/2016 23:05   Ct Abdomen Pelvis W Contrast  Result Date: 05/18/2016 CLINICAL DATA:  Acute onset of generalized abdominal discomfort. Hyperglycemia. Initial encounter. EXAM: CT ABDOMEN AND PELVIS WITH CONTRAST TECHNIQUE: Multidetector CT imaging of the abdomen and pelvis was performed using the standard protocol following bolus administration of intravenous contrast. CONTRAST:  17mL ISOVUE-300 IOPAMIDOL (ISOVUE-300) INJECTION 61% COMPARISON:  Right upper quadrant ultrasound performed 02/23/2011, and CT of the abdomen and pelvis from 12/22/2013 FINDINGS: Lower chest: Diffuse coronary artery calcifications are seen. The visualized lung bases are clear. Hepatobiliary: The liver is unremarkable in appearance. A few stones are noted within the gallbladder. The gallbladder is otherwise unremarkable. The common bile duct remains normal in caliber. Pancreas: The pancreas is within normal limits. Spleen: The spleen is unremarkable in appearance. Adrenals/Urinary Tract: The adrenal glands are unremarkable in appearance. The kidneys are within normal limits. There is no evidence of hydronephrosis. No renal or ureteral stones are identified. No perinephric stranding is seen. Stomach/Bowel: Multiple vague multilobulated soft tissue masses are seen tracking about the small bowel mesentery, measuring up to 2.7 cm in size, extending directly adjacent to small bowel loops. Surrounding soft tissue inflammation is seen, and mildly prominent mesenteric nodes are noted more proximally. These findings are new from 2015. This raises concern for metastatic disease, though desmoid tumor, sclerosing mesenteritis or other inflammatory conditions might have a similar appearance. An ileocolic anastomosis is grossly unremarkable in appearance. The remaining colon is grossly unremarkable in  appearance. The colon is grossly unremarkable in appearance. Vascular/Lymphatic: Scattered calcification is seen along the abdominal aorta and its branches. The abdominal aorta is otherwise grossly unremarkable. The inferior vena cava is grossly unremarkable. No retroperitoneal lymphadenopathy is seen. No pelvic sidewall lymphadenopathy is identified. Reproductive: The bladder is moderately distended and grossly unremarkable. The patient is status post hysterectomy. No suspicious adnexal masses are seen. Other: No additional soft tissue abnormalities are seen. Musculoskeletal: No acute osseous abnormalities are identified. Vacuum phenomenon is noted at L4-L5. The visualized musculature is unremarkable in appearance. IMPRESSION: 1. Multiple vague multilobulated soft tissue masses tracking about the small bowel mesentery, measuring up to 2.7 cm in size, extending directly adjacent to multiple small bowel loops. Surrounding soft tissue inflammation seen, with mildly prominent mesenteric nodes more proximally. These findings are new from 2015. This raises concern for metastatic disease, though desmoid tumor, sclerosing mesenteritis or other inflammatory conditions might have a similar appearance, given the patient's history of Crohn's disease. PET/CT could be performed for further evaluation. 2. Diffuse coronary artery calcifications seen. 3. Cholelithiasis.  Gallbladder otherwise unremarkable. 4. Scattered aortic atherosclerosis. Electronically Signed   By: Garald Balding M.D.   On: 05/18/2016 02:50   Dg Abd 2 Views  Result Date: 05/17/2016 CLINICAL DATA:  Initial evaluation for acute abdominal pain. History of prior SP of. EXAM: ABDOMEN - 2 VIEW COMPARISON:  Prior CT from 12/22/2013. FINDINGS: Multiple prominent air and fluid-filled loops of bowel seen scattered throughout the abdomen. Air-fluid level seen on upright projection. Gas is present within the colon. No free air. No soft tissue mass or abnormal  calcification. Suture material overlies the right abdomen. No acute osseus abnormality. IMPRESSION: Multiple prominent loops of small bowel scattered throughout the abdomen with air-fluid levels on upright projection. Finding somewhat suspicious for possible small bowel obstruction. Follow-up exam with cross-sectional imaging could be performed for further evaluation as warranted. Electronically Signed  By: Jeannine Boga M.D.   On: 05/17/2016 23:07    EKG:   Orders placed or performed during the hospital encounter of 05/17/16  . ED EKG  . ED EKG  . ED EKG  . ED EKG  . EKG 12-Lead  . EKG 12-Lead  . EKG 12-Lead  . EKG 12-Lead  . EKG 12-Lead    ASSESSMENT AND PLAN:    This is a 70 y.o. female with a history of Crohn's,. DM, hypothyroid, collagen vascular disease, pernicious anemia, ankylosing spondylitis, HLD, OA, PUD, RA, h/o SBO  now being admitted with:  #. Abdominal pain, suspect Cdiff. CT findings ?mesenteritis, ?mets, ?Crohn's flare. -Clear to full liquid diet -Supportive treatment, pain management as needed - Gentle IVFs - Discussed with patient's primary gastroenterologist Dr. Emelda Fear, CT abdomen results faxed to her office as per her request-[phone], will send a copy of the CT at the time of discharge with the patient.She has referred to patient's previous MRI of the abdomen performed in 2017 Recommending colonoscopy if no clinical improvement otherwise outpatient follow-up with Dr. Emelda Fear in a week after discharge - GI consult pending - Surgical consult -no obstruction identified no need of invasive procedures at this time - Stool for C. difficile is negative. Occult blood is negative. GI panel negative  #. Hypokalemia and acute on chronic hypomagnesemia, likely secondary to diarrhea and decreased PO intake. - Replaced IV - Repeat potassium 3.7 K and Mg given in ED - Recheck CMP in AM  #. H/o Diabetes - Accuchecks achs with RISS coverage - Clear liquids,  advance as tolerated  #. History of hypothyroidism - Continue Synthroid, Armour    All the records are reviewed and case discussed with Care Management/Social Workerr. Management plans discussed with the patient, family and they are in agreement.  CODE STATUS: fc   TOTAL TIME TAKING CARE OF THIS PATIENT: 39  minutes.   POSSIBLE D/C IN 2  DAYS, DEPENDING ON CLINICAL CONDITION.  Note: This dictation was prepared with Dragon dictation along with smaller phrase technology. Any transcriptional errors that result from this process are unintentional.   Nicholes Mango M.D on 05/18/2016 at 5:07 PM  Between 7am to 6pm - Pager - [phone] After 6pm go to [email or web address] - password [email or web address] [facility]  Office  [phone]  CC: Primary care physician; Sharyne Peach, MD

## 2016-05-18 NOTE — ED Provider Notes (Signed)
-----------------------------------------   2:57 AM on 05/18/2016 -----------------------------------------  Assumed care of patient at shift change who was pending CT scan. Dr. Bary Castilla from general surgery evaluated the patient at bedside. Felt she did not have clinical symptoms consistent with obstruction; agreed with CT scan for further delineation. Recommended medicine admission for management of electrolytes and would follow patient in the hospital.  CT abdomen and pelvis interpreted per Dr. Radene Knee: 1. Multiple vague multilobulated soft tissue masses tracking about  the small bowel mesentery, measuring up to 2.7 cm in size, extending  directly adjacent to multiple small bowel loops. Surrounding soft  tissue inflammation seen, with mildly prominent mesenteric nodes  more proximally. These findings are new from 2015. This raises  concern for metastatic disease, though desmoid tumor, sclerosing  mesenteritis or other inflammatory conditions might have a similar  appearance, given the patient's history of Crohn's disease. PET/CT  could be performed for further evaluation.  2. Diffuse coronary artery calcifications seen.  3. Cholelithiasis. Gallbladder otherwise unremarkable.  4. Scattered aortic atherosclerosis.   Discussed with hospitalist who will evaluate patient in the emergency department for admission.   Paulette Blanch, MD 05/18/16 [phone]

## 2016-05-18 NOTE — H&P (Signed)
History and Physical   [facility] - [place] @ [facility] Admission History and Physical [employer], D.O.    Patient Name: Joanna Ortiz MR#: [id number] Date of Birth: 05/23/1945 Date of Admission: 05/17/2016  Referring MD/NP/PA: Dr. Beather Arbour Primary Care Physician: Sharyne Peach, MD Patient coming from: Home Outpatient Specialists: Rheum, GI, Ortho  Chief Complaint:  Chief Complaint  Patient presents with  . Hyperglycemia    HPI: Joanna Ortiz is a 71 y.o. female with a known history of Crohn's,. DM, hypothyroid, collagen vascular disease, Anemia, ankylosing spondylitis, HLD, OA, PUD, RA, h/o SBO presents to the emergency department for evaluation of abdominal pain.  Patient was in a usual state of health until several days ago when she began experiencing bloating, abdominal pain, fevers, weakness and loose stool.   Abdominal pain is intermittent, worse with food intake and associated with several watery stools over the last 3 days. Patient has diarrhea at baseline but this is significantly different.  Decreased PO intake secondary to above.    Patient denies dizziness, chest pain, shortness of breath,bloody stools, dysuria/frequency, changes in mental status.    Of note patient was recently on antibiotics for bronchitis.  She also has a history of CDiff in July 2017.   Otherwise there has been no change in status. Patient has been taking medication as prescribed and there has been no recent change in medication or diet. There has been no recent travel or sick contacts.    EMS/ED Course: Patient received Tylenol, Magnesium, Zofran, Potassium, NS.  Review of Systems:  CONSTITUTIONAL: Positive fever/chills, fatigue, weakness, Negative weight gain/loss, headache. EYES: No blurry or double vision. ENT: No tinnitus, postnasal drip, redness or soreness of the oropharynx. RESPIRATORY: No cough, dyspnea, wheeze.  No hemoptysis.  CARDIOVASCULAR: No chest pain, palpitations,  syncope, orthopnea. No lower extremity edema.  GASTROINTESTINAL: Positive nausea, abdominal pain, diarrhea, Negative vomiting, constipation.  No hematemesis, melena or hematochezia. GENITOURINARY: No dysuria, frequency, hematuria. ENDOCRINE: No polyuria or nocturia. No heat or cold intolerance. HEMATOLOGY: No anemia, bruising, bleeding. INTEGUMENTARY: No rashes, ulcers, lesions. MUSCULOSKELETAL: No arthritis, gout, dyspnea. NEUROLOGIC: No numbness, tingling, ataxia, seizure-type activity, weakness. PSYCHIATRIC: No anxiety, depression, insomnia.   Past Medical History:  Diagnosis Date  . Collagen vascular disease ([place])   . Crohn disease ([place])   . Diabetes mellitus without complication ([place])   . Thyroid disease   Anemia, ankylosing spondylitis, HLD, OA, PUD, RA, h/o SBO  Past Surgical History:  Procedure Laterality Date  . ABDOMINAL HYSTERECTOMY  1987  . APPENDECTOMY    . BREAST BIOPSY Bilateral    neg  . COLON SURGERY  06/06/2011   Resection of terminal ileum and previous ileocolonic anastomosis for recurrent Crohn's disease. 3 focal strictures identified.  . COLONOSCOPY  2013  . SMALL INTESTINE SURGERY  2012  . THYROID SURGERY  1992     reports that she has quit smoking. Her smoking use included Cigarettes. She has a 12.00 pack-year smoking history. She has never used smokeless tobacco. She reports that she does not drink alcohol or use drugs.  Allergies  Allergen Reactions  . Erythromycin Hives  . Hydromorphone Hives  . Cefuroxime Nausea And Vomiting  . Ciprocinonide [Fluocinolone]     Muscle pain   . Codeine     Other reaction(s): Vomiting  . Floxin [Ofloxacin] Nausea And Vomiting  . Keflex [Cephalexin] Nausea And Vomiting  . Levofloxacin Nausea And Vomiting  . Sulfa Antibiotics Nausea And Vomiting  . Ciprofloxacin Nausea And Vomiting  Family History  Problem Relation Age of Onset  . Breast cancer Sister 83  Father with heart disease  Prior to Admission  medications   Medication Sig Start Date End Date Taking? Authorizing Provider  acetaminophen (TYLENOL) 325 MG tablet Take 2 tablets (650 mg total) by mouth every 6 (six) hours as needed for mild pain (or Fever >/= 101). 05/04/16  Yes Nicholes Mango, MD  albuterol (PROVENTIL HFA;VENTOLIN HFA) 108 (90 Base) MCG/ACT inhaler Inhale 2 puffs into the lungs every 6 (six) hours as needed for wheezing or shortness of breath. 05/04/16  Yes Nicholes Mango, MD  cholestyramine Lucrezia Starch) 4 G packet Take 4 g by mouth 2 (two) times daily as needed (stomach problems).    Yes Historical Provider, MD  colestipol (COLESTID) 1 G tablet Take 1 g by mouth daily as needed (for diarrhea).    Yes Historical Provider, MD  cyanocobalamin (,VITAMIN B-12,) 1000 MCG/ML injection Inject 1,000 mcg into the muscle every 30 (thirty) days.   Yes Historical Provider, MD  glipiZIDE (GLUCOTROL XL) 5 MG 24 hr tablet Take 1 tablet by mouth daily. 05/09/16  Yes Historical Provider, MD  HUMIRA PEN 40 MG/0.8ML PNKT Inject 40 mg into the skin every 14 (fourteen) days.  09/08/13  Yes Historical Provider, MD  Multiple Vitamin (MULTIVITAMIN) tablet Take 1 tablet by mouth daily.   Yes Historical Provider, MD  Omega-3 Fatty Acids (FISH OIL) 1000 MG CAPS Take 1,000 mg by mouth daily.    Yes Historical Provider, MD  potassium chloride (K-DUR,KLOR-CON) 10 MEQ tablet Take 4 tablets by mouth daily. For 7 days 05/17/16  Yes Historical Provider, MD  SYNTHROID 100 MCG tablet Take 100 mcg by mouth daily before breakfast.  04/27/14  Yes Historical Provider, MD  thyroid (ARMOUR) 30 MG tablet Take 30 mg by mouth every morning.   Yes Historical Provider, MD  TURMERIC CURCUMIN PO Take 1 capsule by mouth daily.   Yes Historical Provider, MD  doxycycline (VIBRA-TABS) 100 MG tablet Take 1 tablet (100 mg total) by mouth every 12 (twelve) hours. Patient not taking: Reported on 05/17/2016 05/04/16   Nicholes Mango, MD  predniSONE (STERAPRED UNI-PAK 21 TAB) 10 MG (21) TBPK tablet Take  1 tablet (10 mg total) by mouth daily. Take 6 tablets by mouth for 1 day followed by  5 tablets by mouth for 1 day followed by  4 tablets by mouth for 1 day followed by  3 tablets by mouth for 1 day followed by  2 tablets by mouth for 1 day followed by  1 tablet by mouth for a day and stop Patient not taking: Reported on 05/17/2016 05/04/16   Nicholes Mango, MD  traMADol (ULTRAM) 50 MG tablet Take 1 tablet (50 mg total) by mouth every 6 (six) hours as needed. Patient not taking: Reported on 05/01/2016 03/12/16   Daymon Larsen, MD    Physical Exam: Vitals:   05/18/16 0021 05/18/16 0030 05/18/16 0138 05/18/16 0200  BP:  110/61 106/65 101/64  Pulse:  89 92 73  Resp:  17 19 (!) 21  Temp: 98.6 F (37 C)     TempSrc: Oral     SpO2:  96% 97% 97%  Weight:      Height:        GENERAL: 71 y.o.-year-old white female patient, well-developed, well-nourished lying in the bed in no acute distress.  Pleasant and cooperative.   HEENT: Head atraumatic, normocephalic. Pupils equal, round, reactive to light and accommodation. No scleral icterus. Extraocular  muscles intact. Nares are patent. Oropharynx is clear. Mucus membranes dry NECK: Supple, full range of motion. No JVD, no bruit heard. No thyroid enlargement, no tenderness, no cervical lymphadenopathy. CHEST: Normal breath sounds bilaterally. No wheezing, rales, rhonchi or crackles. No use of accessory muscles of respiration.  No reproducible chest wall tenderness.  CARDIOVASCULAR: S1, S2 normal. No murmurs, rubs, or gallops. Cap refill <2 seconds. Pulses intact distally.  ABDOMEN: Soft, mild distention, mild tenderness diffusely No rebound, guarding, rigidity. Normoactive bowel sounds present in all four quadrants. No organomegaly or mass. EXTREMITIES: No pedal edema, cyanosis, or clubbing. No calf tenderness or Homan's sign.  NEUROLOGIC: The patient is alert and oriented x 3. Cranial nerves II through XII are grossly intact with no focal sensorimotor  deficit. Muscle strength 5/5 in all extremities. Sensation intact. Gait not checked. PSYCHIATRIC:  Normal affect, mood, thought content. SKIN: Warm, dry, and intact without obvious rash, lesion, or ulcer.    Labs on Admission:  CBC:  Recent Labs Lab 05/17/16 2219  WBC 9.8  NEUTROABS 6.9*  HGB 10.2*  HCT 29.9*  MCV 82.1  PLT 765   Basic Metabolic Panel:  Recent Labs Lab 05/17/16 2219 05/17/16 2220  NA 133*  --   K 2.5*  --   CL 104  --   CO2 21*  --   GLUCOSE 154*  --   BUN 7  --   CREATININE 0.80  --   CALCIUM 8.1*  --   MG  --  1.4*   GFR: Estimated Creatinine Clearance: 71.2 mL/min (by C-G formula based on SCr of 0.8 mg/dL). Liver Function Tests:  Recent Labs Lab 05/17/16 2220  AST 15  ALT 15  ALKPHOS 51  BILITOT 0.8  PROT 6.5  ALBUMIN 2.8*    Recent Labs Lab 05/17/16 2220  LIPASE 13   No results for input(s): AMMONIA in the last 168 hours. Coagulation Profile: No results for input(s): INR, PROTIME in the last 168 hours. Cardiac Enzymes: No results for input(s): CKTOTAL, CKMB, CKMBINDEX, TROPONINI in the last 168 hours. BNP (last 3 results) No results for input(s): PROBNP in the last 8760 hours. HbA1C: No results for input(s): HGBA1C in the last 72 hours. CBG:  Recent Labs Lab 05/17/16 2215  GLUCAP 149*   Lipid Profile: No results for input(s): CHOL, HDL, LDLCALC, TRIG, CHOLHDL, LDLDIRECT in the last 72 hours. Thyroid Function Tests: No results for input(s): TSH, T4TOTAL, FREET4, T3FREE, THYROIDAB in the last 72 hours. Anemia Panel: No results for input(s): VITAMINB12, FOLATE, FERRITIN, TIBC, IRON, RETICCTPCT in the last 72 hours. Urine analysis:    Component Value Date/Time   COLORURINE YELLOW (A) 05/17/2016 2310   APPEARANCEUR CLEAR (A) 05/17/2016 2310   APPEARANCEUR Clear 03/06/2011 0952   LABSPEC 1.004 (L) 05/17/2016 2310   LABSPEC 1.002 03/06/2011 0952   PHURINE 6.0 05/17/2016 2310   GLUCOSEU NEGATIVE 05/17/2016 2310    GLUCOSEU Negative 03/06/2011 0952   HGBUR SMALL (A) 05/17/2016 2310   BILIRUBINUR NEGATIVE 05/17/2016 2310   BILIRUBINUR Negative 03/06/2011 [place] 05/17/2016 2310   PROTEINUR NEGATIVE 05/17/2016 2310   NITRITE NEGATIVE 05/17/2016 2310   LEUKOCYTESUR NEGATIVE 05/17/2016 2310   LEUKOCYTESUR Negative 03/06/2011 0952   Sepsis Labs: @LABRCNTIP (procalcitonin:4,lacticidven:4) )No results found for this or any previous visit (from the past 240 hour(s)).   Radiological Exams on Admission: Dg Chest 2 View  Result Date: 05/17/2016 CLINICAL DATA:  Initial evaluation for acute fever, weakness. EXAM: CHEST  2 VIEW COMPARISON:  Prior radiograph from 05/01/2016. FINDINGS: Cardiac and mediastinal silhouettes are stable in size and contour, and remain within normal limits. Lungs normally inflated. Mild left basilar atelectasis. No consolidative airspace disease. No pulmonary edema or pleural effusion. No pneumothorax. No acute osseus abnormality.  Left shoulder arthroplasty noted. IMPRESSION: Mild left basilar atelectasis. No other active cardiopulmonary disease. Electronically Signed   By: Jeannine Boga M.D.   On: 05/17/2016 23:05   Dg Abd 2 Views  Result Date: 05/17/2016 CLINICAL DATA:  Initial evaluation for acute abdominal pain. History of prior SP of. EXAM: ABDOMEN - 2 VIEW COMPARISON:  Prior CT from 12/22/2013. FINDINGS: Multiple prominent air and fluid-filled loops of bowel seen scattered throughout the abdomen. Air-fluid level seen on upright projection. Gas is present within the colon. No free air. No soft tissue mass or abnormal calcification. Suture material overlies the right abdomen. No acute osseus abnormality. IMPRESSION: Multiple prominent loops of small bowel scattered throughout the abdomen with air-fluid levels on upright projection. Finding somewhat suspicious for possible small bowel obstruction. Follow-up exam with cross-sectional imaging could be performed for  further evaluation as warranted. Electronically Signed   By: Jeannine Boga M.D.   On: 05/17/2016 23:07    EKG: Pending  Assessment/Plan  This is a 71 y.o. female with a history of Crohn's,. DM, hypothyroid, collagen vascular disease, pernicious anemia, ankylosing spondylitis, HLD, OA, PUD, RA, h/o SBO  now being admitted with:  #. Abdominal pain, suspect Cdiff. CT findings ?mesenteritis, ?mets, ?Crohn's flare. - Admit to inpatient - Gentle IVFs - Pain control, antiemetics - GI consult - Surgical consult appreciated - Check Cdiff  #. Hypokalemia and acute on chronic hypomagnesemia, likely secondary to diarrhea and decreased PO intake. - Replaced IV - K and Mg given in ED - Recheck CMP in AM  #. H/o Diabetes - Accuchecks achs with RISS coverage - Clear liquids, advance as tolerated  #. History of hypothyroidism - Continue Synthroid, Armour  Admission status: Inpatient IV Fluids: NS Diet/Nutrition: Clears Consults called: GI, Surgery  DVT Px: Heparin,  SCDs and early ambulation. Code Status: Full Code  Disposition Plan: To home in 1-2 days  All the records are reviewed and case discussed with ED provider. Management plans discussed with the patient and/or family who express understanding and agree with plan of care.  Miranda Garber D.O. on 05/18/2016 at 2:45 AM Between 7am to 6pm - Pager - [phone] After 6pm go to [email or web address] - [employer] [facility] [place] [facility] Office [phone] CC: Primary care physician; Sharyne Peach, MD   05/18/2016, 2:45 AM

## 2016-05-18 NOTE — Progress Notes (Signed)
Afebrile since admission, tachycardia improved w/ fluids. CT reviewed. Mesenteric vessels normal.  No obstruction. 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 Soft tissue densities in the mesentery new from 2015. MRI Rob [facility]ickman, July 2017) reported prominent nodes, but not the soft tissue densities. (Films not available for review). Abd: Soft. IMP: No evidence of obstruction. Long standing therapy with Humira (Held x 2 months with recent illness) and multiple courses of steroids over the last six months. Question of whether abdominal pain is related to the 2 cm soft tissue densities or some occult infection masked by the Humira and steroids. Would consider contacting [facility] (Dr Thompson Caul) to review case.  Dr. Jamal Collin available today and tomorrow if acute change in condition.  I will see patient on Sunday.

## 2016-05-19 LAB — GLUCOSE, CAPILLARY
GLUCOSE-CAPILLARY: 139 mg/dL — AB (ref 65–99)
GLUCOSE-CAPILLARY: 110 mg/dL — AB (ref 65–99)
Glucose-Capillary: 129 mg/dL — ABNORMAL HIGH (ref 65–99)
Glucose-Capillary: 121 mg/dL — ABNORMAL HIGH (ref 65–99)

## 2016-05-19 LAB — POTASSIUM: POTASSIUM: 3.5 mmol/L (ref 3.5–5.1)

## 2016-05-19 LAB — CBC
HCT: 25.9 % — ABNORMAL LOW (ref 35.0–47.0)
HEMOGLOBIN: 8.8 g/dL — AB (ref 12.0–16.0)
MCH: 28.4 pg (ref 26.0–34.0)
MCHC: 33.9 g/dL (ref 32.0–36.0)
MCV: 83.8 fL (ref 80.0–100.0)
PLATELETS: 171 10*3/uL (ref 150–440)
RBC: 3.09 MIL/uL — AB (ref 3.80–5.20)
RDW: 16.7 % — ABNORMAL HIGH (ref 11.5–14.5)
WBC: 5.9 10*3/uL (ref 3.6–11.0)

## 2016-05-19 LAB — C-REACTIVE PROTEIN: CRP: 11.5 mg/dL — ABNORMAL HIGH (ref <1.0)

## 2016-05-19 LAB — URINE CULTURE: SPECIAL REQUESTS: NORMAL

## 2016-05-19 MED ORDER — BUDESONIDE 3 MG PO CPEP
9.0000 mg | ORAL_CAPSULE | Freq: Every day | ORAL | Status: DC
  Administered 2016-05-19 – 2016-05-21 (×3): 9 mg via ORAL
  Filled 2016-05-19 (×3): qty 3

## 2016-05-19 MED ORDER — GLUCERNA SHAKE PO LIQD
237.0000 mL | Freq: Three times a day (TID) | ORAL | Status: DC
  Administered 2016-05-19 – 2016-05-21 (×6): 237 mL via ORAL

## 2016-05-19 NOTE — Consult Note (Signed)
Consultation  Referring Provider:     No ref. provider found Primary Care Physician:  Sharyne Peach, MD Primary Gastroenterologist:  Dr. Emelda Fear &  PA  Reason for Consultation: abdominal pain  Date of Admission:  05/17/2016 Date of Consultation:  05/19/2016         HPI:   Joanna Ortiz is a 71 y.o. 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 The patient reports that she really presented to the ER because her blood sugar at home was 500.   The patient reports being diagnosed with Crohn's disease 20-25 years ago ([facility] notes say 52). She has an ileo-cecal resection in 1999 which was likely for stricturing disease based on her history. It is unclear how much bowel was resected at that time. She received various medical therapies for Crohn's. 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 Most recently, she started adalimumab at standard dosing over 5 years ago. This is her first biologic. While on adalimumab, she developed worsening abdominal pain, bloating and diarrhea associated with foods. She had an exploratory laparotomy in 2013 which noted several strictures in the TI and therefore, she underwent a second ileo-colonic resection, this time 44cm of small bowel was resected. Remaining length was bowel was not specified in the op note. Post-operatively, she continued on adalimumab. Sometime more recently, she was dose escalated up to weekly, but this past fall, she was decreased back down to q2 weeks. The patient reports that she never felt any different with the humira, but continued taking it in case she was getting a response that she could not feel. For the past few months, she has been on  intermittent courses of steroids to help alleviate GI symptoms.   For the past year, she reports having 5-6 BMs daily. She also has abdominal pain, distention, bloating, nausea and then diarrhea, which relieves these symptoms, associated with eating. She feels that these symptoms are similar to her symptoms with Crohn's flares over the years. Over the past few weeks, these symptoms have worsened because she changed her diet to include more salads and such healthy fibrous foods to address her new diagnosis of diabetes. This was made during her March 2018 hospitalization for bronchitis when she last received a very short course of steroids. She was also treated with levofloxacin during this time. Given the infection, she has not had any adalimumab for at least the past 4 weeks.  Of note, her GI symptoms always improve on steroids and if she were to go without eating or is on a liquid diet. She has been using [place] at home, she is unsure if she uses colestid. Her husband reports that she does not use questran as instructed and often leaves much of the medicine in the cup, so she may not be getting a full gram daily. The patient feels that if she did not use questran, she would be tied to the house with diarrhea.   More recently, she reports fevers and night sweats. She has been losing weight, although she cannot tell how much because her abdomen feels larger and she believes the weight loss is in other places.   She notes that her upper extremity edema has been chronic and not investigated. She was told that it  was due to lymphedema, but there is no obvious reason for her to have upper extremity lymphedema.  She has seen Dr. Bary Castilla with surgery since her first surgery in 1999 and he has been doing her colonoscopies as well. Her last colonoscopy was in 2016 which demonstrated mild inflammation at the anastomosis. She has been seeing Dr. Bari Mantis PA at [facility] for over 6 years as well. Review of care everywhere  reveals that she last saw Dr. Bari Mantis PA in March 2018. She had an MRE performed in July 2017, which per the note, did not reveal any strictures, but did reveal active disease. In June 2017, she was seen in clinic for worsening gas, abdominal pain and diarrhea. She was started on a steroid taper, but stool studies at that time were notable for C.diff. She was treated with a 10 day course of vancomycin, with which she felt remarkably better.   She was a former smoker, but quit smoking 49 years ago. Her arthralgias and fatigue are stable at baseline. She denies hematemesis, coffee ground emesis, melena, hematochezia, rashes, chills and eye problems.  Past Medical History:  Diagnosis Date  . Collagen vascular disease ([place])   . Crohn disease ([place])   . Diabetes mellitus without complication ([place])   . Thyroid disease     Past Surgical History:  Procedure Laterality Date  . ABDOMINAL HYSTERECTOMY  1987  . APPENDECTOMY    . BREAST BIOPSY Bilateral    neg  . COLON SURGERY  06/06/2011   Resection of terminal ileum and previous ileocolonic anastomosis for recurrent Crohn's disease. 3 focal strictures identified.  . COLONOSCOPY  2013  . SMALL INTESTINE SURGERY  2012  . THYROID SURGERY  1992    Prior to Admission medications   Medication Sig Start Date End Date Taking? Authorizing Provider  acetaminophen (TYLENOL) 325 MG tablet Take 2 tablets (650 mg total) by mouth every 6 (six) hours as needed for mild pain (or Fever >/= 101). 05/04/16  Yes Nicholes Mango, MD  albuterol (PROVENTIL HFA;VENTOLIN HFA) 108 (90 Base) MCG/ACT inhaler Inhale 2 puffs into the lungs every 6 (six) hours as needed for wheezing or shortness of breath. 05/04/16  Yes Nicholes Mango, MD  cholestyramine Lucrezia Starch) 4 G packet Take 4 g by mouth 2 (two) times daily as needed (stomach problems).    Yes Historical Provider, MD  colestipol (COLESTID) 1 G tablet Take 1 g by mouth daily as needed (for diarrhea).    Yes Historical Provider, MD    cyanocobalamin (,VITAMIN B-12,) 1000 MCG/ML injection Inject 1,000 mcg into the muscle every 30 (thirty) days.   Yes Historical Provider, MD  glipiZIDE (GLUCOTROL XL) 5 MG 24 hr tablet Take 1 tablet by mouth daily. 05/09/16  Yes Historical Provider, MD  HUMIRA PEN 40 MG/0.8ML PNKT Inject 40 mg into the skin every 14 (fourteen) days.  09/08/13  Yes Historical Provider, MD  Multiple Vitamin (MULTIVITAMIN) tablet Take 1 tablet by mouth daily.   Yes Historical Provider, MD  Omega-3 Fatty Acids (FISH OIL) 1000 MG CAPS Take 1,000 mg by mouth daily.    Yes Historical Provider, MD  potassium chloride (K-DUR,KLOR-CON) 10 MEQ tablet Take 4 tablets by mouth daily. For 7 days 05/17/16  Yes Historical Provider, MD  SYNTHROID 100 MCG tablet Take 100 mcg by mouth daily before breakfast.  04/27/14  Yes Historical Provider, MD  thyroid (ARMOUR) 30 MG tablet Take 30 mg by mouth every morning.   Yes Historical Provider, MD  TURMERIC CURCUMIN PO  Take 1 capsule by mouth daily.   Yes Historical Provider, MD  doxycycline (VIBRA-TABS) 100 MG tablet Take 1 tablet (100 mg total) by mouth every 12 (twelve) hours. Patient not taking: Reported on 05/17/2016 05/04/16   Nicholes Mango, MD  predniSONE (STERAPRED UNI-PAK 21 TAB) 10 MG (21) TBPK tablet Take 1 tablet (10 mg total) by mouth daily. Take 6 tablets by mouth for 1 day followed by  5 tablets by mouth for 1 day followed by  4 tablets by mouth for 1 day followed by  3 tablets by mouth for 1 day followed by  2 tablets by mouth for 1 day followed by  1 tablet by mouth for a day and stop Patient not taking: Reported on 05/17/2016 05/04/16   Nicholes Mango, MD  traMADol (ULTRAM) 50 MG tablet Take 1 tablet (50 mg total) by mouth every 6 (six) hours as needed. Patient not taking: Reported on 05/01/2016 03/12/16   Daymon Larsen, MD    Family History  Problem Relation Age of Onset  . Breast cancer Sister 21     Social History  Substance Use Topics  . Smoking status: Former Smoker     Packs/day: 1.00    Years: 12.00    Types: Cigarettes  . Smokeless tobacco: Never Used  . Alcohol use No    Allergies as of 05/17/2016 - Review Complete 05/17/2016  Allergen Reaction Noted  . Erythromycin Hives 09/04/2014  . Hydromorphone Hives 09/04/2014  . Cefuroxime Nausea And Vomiting 12/16/2013  . Ciprocinonide [fluocinolone]  12/16/2013  . Codeine  09/04/2014  . Floxin [ofloxacin] Nausea And Vomiting 12/16/2013  . Keflex [cephalexin] Nausea And Vomiting 12/16/2013  . Levofloxacin Nausea And Vomiting 12/16/2013  . Sulfa antibiotics Nausea And Vomiting 12/16/2013  . Ciprofloxacin Nausea And Vomiting 09/04/2014    Review of Systems:    All systems reviewed and negative except where noted in HPI.   Physical Exam:  Vital signs in last 24 hours: Temp:  [98.1 F (36.7 C)-98.4 F (36.9 C)] 98.1 F (36.7 C) (03/31 1225) Pulse Rate:  [86-94] 90 (03/31 1442) Resp:  [17-20] 20 (03/31 1225) BP: (120-122)/(59-60) 122/60 (03/31 1225) SpO2:  [98 %-100 %] 98 % (03/31 1442) Last BM Date: 05/18/16   General: Older white female resting comfortably in a hospital bed in NAD Head: Normocephalic and atraumatic. Eyes: No scleral icterus or conjunctival pallor Ears:  Normal auditory acuity. Neck: Supple; no masses or cervical lymphadenopathy Lungs: Respirations even and unlabored. Lungs clear to auscultation bilaterally.   No wheezes, crackles, or rhonchi.  Heart: Regular rate and rhythm, normal S1, S2, without murmur, clicks, rubs or gallops Abdomen: +BS, soft, mildly TTP in RLQ without r/g, distended, but not tensely so, could not appreciate hepatosplenomegaly Rectal: deferred Msk: Symmetrical without gross deformities Extremities: bilateral upper extremity swelling without erythema or warmth, no lower extremity edema or erythema Neurologic: alert, grossly normal neurologically. Skin: intact without significant lesions or rashes. Psych: Cooperative, appropriate affect  LAB  RESULTS:  Recent Labs  05/17/16 2219 05/18/16 0649 05/19/16 0327  WBC 9.8 6.0 5.9  HGB 10.2* 8.9* 8.8*  HCT 29.9* 26.0* 25.9*  PLT 204 156 171   BMET  Recent Labs  05/18/16 0649 05/18/16 1418 05/18/16 1756 05/19/16 1228  NA 139 139 139  --   K 2.7* 3.7 2.9* 3.5  CL 112* 112* 111  --   CO2 23 24 23   --   GLUCOSE 153* 106* 142*  --   BUN 5* <5* <  5*  --   CREATININE 0.63 0.61 0.60  --   CALCIUM 7.6* 7.6* 7.5*  --    LFT  Recent Labs  05/17/16 2220  05/18/16 1756  PROT 6.5  < > 5.8*  ALBUMIN 2.8*  < > 2.6*  AST 15  < > 13*  ALT 15  < > 14  ALKPHOS 51  < > 43  BILITOT 0.8  < > 0.6  BILIDIR 0.1  --   --   IBILI 0.7  --   --   < > = values in this interval not displayed. PT/INR No results for input(s): LABPROT, INR in the last 72 hours.  STUDIES: Dg Chest 2 View  Result Date: 05/17/2016 CLINICAL DATA:  Initial evaluation for acute fever, weakness. EXAM: CHEST  2 VIEW COMPARISON:  Prior radiograph from 05/01/2016. FINDINGS: Cardiac and mediastinal silhouettes are stable in size and contour, and remain within normal limits. Lungs normally inflated. Mild left basilar atelectasis. No consolidative airspace disease. No pulmonary edema or pleural effusion. No pneumothorax. No acute osseus abnormality.  Left shoulder arthroplasty noted. IMPRESSION: Mild left basilar atelectasis. No other active cardiopulmonary disease. Electronically Signed   By: Jeannine Boga M.D.   On: 05/17/2016 23:05   Ct Abdomen Pelvis W Contrast  Result Date: 05/18/2016 CLINICAL DATA:  Acute onset of generalized abdominal discomfort. Hyperglycemia. Initial encounter. EXAM: CT ABDOMEN AND PELVIS WITH CONTRAST TECHNIQUE: Multidetector CT imaging of the abdomen and pelvis was performed using the standard protocol following bolus administration of intravenous contrast. CONTRAST:  183mL ISOVUE-300 IOPAMIDOL (ISOVUE-300) INJECTION 61% COMPARISON:  Right upper quadrant ultrasound performed 02/23/2011,  and CT of the abdomen and pelvis from 12/22/2013 FINDINGS: Lower chest: Diffuse coronary artery calcifications are seen. The visualized lung bases are clear. Hepatobiliary: The liver is unremarkable in appearance. A few stones are noted within the gallbladder. The gallbladder is otherwise unremarkable. The common bile duct remains normal in caliber. Pancreas: The pancreas is within normal limits. Spleen: The spleen is unremarkable in appearance. Adrenals/Urinary Tract: The adrenal glands are unremarkable in appearance. The kidneys are within normal limits. There is no evidence of hydronephrosis. No renal or ureteral stones are identified. No perinephric stranding is seen. Stomach/Bowel: Multiple vague multilobulated soft tissue masses are seen tracking about the small bowel mesentery, measuring up to 2.7 cm in size, extending directly adjacent to small bowel loops. Surrounding soft tissue inflammation is seen, and mildly prominent mesenteric nodes are noted more proximally. These findings are new from 2015. This raises concern for metastatic disease, though desmoid tumor, sclerosing mesenteritis or other inflammatory conditions might have a similar appearance. An ileocolic anastomosis is grossly unremarkable in appearance. The remaining colon is grossly unremarkable in appearance. The colon is grossly unremarkable in appearance. Vascular/Lymphatic: Scattered calcification is seen along the abdominal aorta and its branches. The abdominal aorta is otherwise grossly unremarkable. The inferior vena cava is grossly unremarkable. No retroperitoneal lymphadenopathy is seen. No pelvic sidewall lymphadenopathy is identified. Reproductive: The bladder is moderately distended and grossly unremarkable. The patient is status post hysterectomy. No suspicious adnexal masses are seen. Other: No additional soft tissue abnormalities are seen. Musculoskeletal: No acute osseous abnormalities are identified. Vacuum phenomenon is noted  at L4-L5. The visualized musculature is unremarkable in appearance. IMPRESSION: 1. Multiple vague multilobulated soft tissue masses tracking about the small bowel mesentery, measuring up to 2.7 cm in size, extending directly adjacent to multiple small bowel loops. Surrounding soft tissue inflammation seen, with mildly prominent mesenteric nodes more proximally.  These findings are new from 2015. This raises concern for metastatic disease, though desmoid tumor, sclerosing mesenteritis or other inflammatory conditions might have a similar appearance, given the patient's history of Crohn's disease. PET/CT could be performed for further evaluation. 2. Diffuse coronary artery calcifications seen. 3. Cholelithiasis.  Gallbladder otherwise unremarkable. 4. Scattered aortic atherosclerosis. Electronically Signed   By: Garald Balding M.D.   On: 05/18/2016 02:50   Dg Abd 2 Views  Result Date: 05/17/2016 CLINICAL DATA:  Initial evaluation for acute abdominal pain. History of prior SP of. EXAM: ABDOMEN - 2 VIEW COMPARISON:  Prior CT from 12/22/2013. FINDINGS: Multiple prominent air and fluid-filled loops of bowel seen scattered throughout the abdomen. Air-fluid level seen on upright projection. Gas is present within the colon. No free air. No soft tissue mass or abnormal calcification. Suture material overlies the right abdomen. No acute osseus abnormality. IMPRESSION: Multiple prominent loops of small bowel scattered throughout the abdomen with air-fluid levels on upright projection. Finding somewhat suspicious for possible small bowel obstruction. Follow-up exam with cross-sectional imaging could be performed for further evaluation as warranted. Electronically Signed   By: Jeannine Boga M.D.   On: 05/17/2016 23:07      Impression / Plan:   HEDDA CRUMBLEY is a 71 y.o. 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 While this was not the primary reason for the patient's presentation to the ER, this is her primary reason for hospitalization. Per the patient's own report, these symptoms are consistent with a Crohn's flare. The patient's symptoms seem most consistent with obstructive disease, whether it be fibrotic, inflammatory or involving a component of both. Certainly small intestinal bacterial overgrowth (SIBO), for which she is at high risk, can present in this manner, but she recently completed a course of levofloxacin, which is typically excellent treatment for SIBO. In the acute setting, temporizing her symptoms to bridge her to longitudinal care is the most appropriate path. Therefore I will recommend a full liquid diet and enteral steroids to help treat any inflammatory component of what is likely her stricturing disease. We discussed the risks and benefits of steroid treatment, especially in light of her new diagnosis of diabetes.   She will need outpatient follow up within the next 2-3 weeks with an outpatient colonoscopy with Dr. Emelda Fear. Her anastomosis should be assessed to see if there is stricturing that is amenable to endoscopic dilation as it would be optimal to avoid further surgery in her. While the CT done on admission does not note an anastomotic narrowing, it is not enterography protocol and therefore luminal caliber may not be accurately assessed.   With regards to her Crohn's treatment, based on her history, she is either a primary or secondary non-responder to adalimumab. She did have levels checked over the summer when she was symptomatic, but I cannot see the results of that. In any case, secondary non-response to a subcutaneous anti-TNF does not mean that she cannot respond to an intervenous anti-TNF. As this is my first time meeting the patient and she has stable  outpatient providers, I will not make any recommendations regarding long term therapy with the exception of discontinuing adalimumab, to which she clearly has not had any response in the past 1-2 years and her symptoms progressed. The patient's chronic diarrhea is likely multi-factorial. As her  most recent resection at 44cm of ileum resection, she likely has >50cm resected (the patient reports having around 90cm or 3' resected in total). This places her at high risk not only for bile acid malabsorption, but fat malabsorption as well. Both of these processes can result in diarrhea with weight loss.   - full liquid diet (d/c home on this diet) - start budesonide (Entocort) 9mg  qam  - glucerna tid - discontinue adalimumab (Humira) - outpatient colonoscopy with Dr. Emelda Fear - continue cholestyramine, using at least 1-2 g daily at full doses - budesonide taper pending colonoscopy results - should colonoscopy be unrevealing would consider repeat course of antibiotics to treat SIBO - if she remains hospitalized, would consider bilateral upper extremity doppler ultrasounds to rule out clots as patient's with CD, especially those who are hospitalized, are at high risk for DVTs and the patient has chronic uninvestigated upper edema  Thank you for involving me in the care of this patient.      LOS: 1 day   Lisbeth Renshaw, MD  05/19/2016, 2:50 PM

## 2016-05-19 NOTE — Evaluation (Signed)
Physical Therapy Evaluation Patient Details Name: Joanna Ortiz MRN: [id number] DOB: 1945/07/26 Today's Date: 05/19/2016   History of Present Illness  Joanna Ortiz is a 71 y.o. female with a known history of crohns disease, hypothyroid, HLD, PA, PUD, RA anklylosing spondylitis returns to ED and presents with abdominal pain, fevers, weakness and loose stool.   Clinical Impression  Patient is a 71 yo left hand dominant female presenting with increased Abdominal pain with a hx of Chron's dz. Patient demonstrates independence with all bed mobility, sit to stand transfers, and requires supervision assist for ambulating and modI for stairs performance. Patient demonstrates good balance with ability to perform SLS B without use of UE's. Patient does not require any other physical therapy services at this time.     Follow Up Recommendations No PT follow up    Equipment Recommendations  None recommended by PT    Recommendations for Other Services       Precautions / Restrictions Precautions Precautions: Fall Restrictions Weight Bearing Restrictions: No      Mobility  Bed Mobility Overal bed mobility: Independent                Transfers Overall transfer level: Independent Equipment used: None             General transfer comment: Good speed on performance and patient demonstrates no lack of balance throughout movement  Ambulation/Gait Ambulation/Gait assistance: Supervision Ambulation Distance (Feet): 200 Feet Assistive device: None Gait Pattern/deviations: WFL(Within Functional Limits) Gait velocity: WFL   General Gait Details: Patient able to ambulate with adequate speed and sequencing.   Stairs Stairs: Yes Stairs assistance: Modified independent (Device/Increase time) Stair Management: One rail Right Number of Stairs: 3 General stair comments: Patient demonstrates step over step without postural sway and which adequate speed.  Wheelchair Mobility     Modified Rankin (Stroke Patients Only)       Balance Overall balance assessment: Independent Sitting-balance support: No upper extremity supported Sitting balance-Leahy Scale: Normal     Standing balance support: No upper extremity supported Standing balance-Leahy Scale: Normal Standing balance comment: Negative Rhomberg. Single leg balance and tandem stance >10 seconds                             Pertinent Vitals/Pain Pain Assessment: 0-10 Pain Score: 4  Pain Location: Abdomin Pain Descriptors / Indicators: Aching Pain Intervention(s): Monitored during session    Home Living Family/patient expects to be discharged to:: Private residence Living Arrangements: Spouse/significant other Available Help at Discharge: Family Type of Home: House Home Access: Stairs to enter Entrance Stairs-Rails: Right Entrance Stairs-Number of Steps: 4 Home Layout: Multi-level;Able to live on main level with bedroom/bathroom Home Equipment: None      Prior Function Level of Independence: Independent         Comments: Independent with ADLs/IADLs. Drives. Full community ambulator without assistive device. No falls in the last 12 months     Hand Dominance   Dominant Hand: Left    Extremity/Trunk Assessment   Upper Extremity Assessment Upper Extremity Assessment: Overall WFL for tasks assessed    Lower Extremity Assessment Lower Extremity Assessment: Overall WFL for tasks assessed       Communication   Communication: No difficulties  Cognition Arousal/Alertness: Awake/alert Behavior During Therapy: WFL for tasks assessed/performed Overall Cognitive Status: Within Functional Limits for tasks assessed  General Comments      Exercises Other Exercises Other Exercises: Performed sit to stand -- x 10 with cueing on speed, tandem stance x 20 sec B, SLS x 10sec B, LAQ in sitting -- x 10 , Hip Abd in supine -- x  10 B    Assessment/Plan    PT Assessment Patent does not need any further PT services  PT Problem List         PT Treatment Interventions      PT Goals (Current goals can be found in the Care Plan section)  Acute Rehab PT Goals Patient Stated Goal: Patient would like to return to home PT Goal Formulation: With patient/family Time For Goal Achievement: 06/02/16 Potential to Achieve Goals: Good    Frequency     Barriers to discharge        Co-evaluation               End of Session Equipment Utilized During Treatment: Gait belt Activity Tolerance: Patient tolerated treatment well Patient left: in bed;with call bell/phone within reach;with nursing/sitter in room;with family/visitor present Nurse Communication: Mobility status PT Visit Diagnosis: Muscle weakness (generalized) (M62.81)    Time: 0601-5615 PT Time Calculation (min) (ACUTE ONLY): 24 min   Charges:   PT Evaluation $PT Eval Low Complexity: 1 Procedure PT Treatments $Gait Training: 8-22 mins $Therapeutic Exercise: 8-22 mins   PT G Codes:   PT G-Codes **NOT FOR INPATIENT CLASS** Functional Limitation: Mobility: Walking and moving around Mobility: Walking and Moving Around Current Status ([id number]): At least 1 percent but less than 20 percent impaired, limited or restricted Mobility: Walking and Moving Around Goal Status [phone]): At least 1 percent but less than 20 percent impaired, limited or restricted Mobility: Walking and Moving Around Discharge Status [phone]): At least 1 percent but less than 20 percent impaired, limited or restricted    Blythe Stanford, PT DPT 05/19/16, 2:51 PM [phone] 05/19/2016, 2:49 PM

## 2016-05-19 NOTE — Progress Notes (Signed)
[place] [facility] [place] NAME: Markeita Alicia    MR#:  [id number]  DATE OF BIRTH:  12/23/1945  SUBJECTIVE:  CHIEF COMPLAINT:  Patient is reporting diffuse abdominal pain however it's better than yesterday. Tolerating clear liquid diet.Ambulating in the hall  REVIEW OF SYSTEMS:  CONSTITUTIONAL: No fever, fatigue or weakness.  EYES: No blurred or double vision.  EARS, NOSE, AND THROAT: No tinnitus or ear pain.  RESPIRATORY: No cough, shortness of breath, wheezing or hemoptysis.  CARDIOVASCULAR: No chest pain, orthopnea, edema.  GASTROINTESTINAL: No nausea, vomiting, diarrhea abd. Reporting diffuse abdominal pain.  GENITOURINARY: No dysuria, hematuria.  ENDOCRINE: No polyuria, nocturia,  HEMATOLOGY: No anemia, easy bruising or bleeding SKIN: No rash or lesion. MUSCULOSKELETAL: No joint pain or arthritis.   NEUROLOGIC: No tingling, numbness, weakness.  PSYCHIATRY: No anxiety or depression.   DRUG ALLERGIES:   Allergies  Allergen Reactions  . Erythromycin Hives  . Hydromorphone Hives  . Cefuroxime Nausea And Vomiting  . Ciprocinonide [Fluocinolone]     Muscle pain   . Codeine     Other reaction(s): Vomiting  . Floxin [Ofloxacin] Nausea And Vomiting  . Keflex [Cephalexin] Nausea And Vomiting  . Levofloxacin Nausea And Vomiting  . Sulfa Antibiotics Nausea And Vomiting  . Ciprofloxacin Nausea And Vomiting    VITALS:  Blood pressure (!) 120/59, pulse 94, temperature 98.4 F (36.9 C), temperature source Oral, resp. rate 17, height 5\' 8"  (1.727 m), weight 78.9 kg (174 lb), SpO2 100 %.  PHYSICAL EXAMINATION:  GENERAL:  71 y.o.-year-old patient lying in the bed with no acute distress.  EYES: Pupils equal, round, reactive to light and accommodation. No scleral icterus. Extraocular muscles intact.  HEENT: Head atraumatic, normocephalic. Oropharynx and nasopharynx clear.  NECK:  Supple, no jugular venous distention. No thyroid  enlargement, no tenderness.  LUNGS: Normal breath sounds bilaterally, no wheezing, rales,rhonchi or crepitation. No use of accessory muscles of respiration.  CARDIOVASCULAR: S1, S2 normal. No murmurs, rubs, or gallops.  ABDOMEN: Soft, min Generalized abdominal tenderness, no rebound tenderness, nondistended. Bowel sounds present. No organomegaly or mass.  EXTREMITIES: No pedal edema, cyanosis, or clubbing.  NEUROLOGIC: Cranial nerves II through XII are intact. Muscle strength 5/5 in all extremities. Sensation intact. Gait not checked.  PSYCHIATRIC: The patient is alert and oriented x 3.  SKIN: No obvious rash, lesion, or ulcer.    LABORATORY PANEL:   CBC  Recent Labs Lab 05/19/16 0327  WBC 5.9  HGB 8.8*  HCT 25.9*  PLT 171   ------------------------------------------------------------------------------------------------------------------  Chemistries   Recent Labs Lab 05/18/16 0649  05/18/16 1756  NA 139  < > 139  K 2.7*  < > 2.9*  CL 112*  < > 111  CO2 23  < > 23  GLUCOSE 153*  < > 142*  BUN 5*  < > <5*  CREATININE 0.63  < > 0.60  CALCIUM 7.6*  < > 7.5*  MG 1.9  --   --   AST 10*  < > 13*  ALT 13*  < > 14  ALKPHOS 40  < > 43  BILITOT 0.7  < > 0.6  < > = values in this interval not displayed. ------------------------------------------------------------------------------------------------------------------  Cardiac Enzymes No results for input(s): TROPONINI in the last 168 hours. ------------------------------------------------------------------------------------------------------------------  RADIOLOGY:  Dg Chest 2 View  Result Date: 05/17/2016 CLINICAL DATA:  Initial evaluation for acute fever, weakness. EXAM: CHEST  2 VIEW COMPARISON:  Prior radiograph from 05/01/2016.  FINDINGS: Cardiac and mediastinal silhouettes are stable in size and contour, and remain within normal limits. Lungs normally inflated. Mild left basilar atelectasis. No consolidative airspace  disease. No pulmonary edema or pleural effusion. No pneumothorax. No acute osseus abnormality.  Left shoulder arthroplasty noted. IMPRESSION: Mild left basilar atelectasis. No other active cardiopulmonary disease. Electronically Signed   By: Jeannine Boga M.D.   On: 05/17/2016 23:05   Ct Abdomen Pelvis W Contrast  Result Date: 05/18/2016 CLINICAL DATA:  Acute onset of generalized abdominal discomfort. Hyperglycemia. Initial encounter. EXAM: CT ABDOMEN AND PELVIS WITH CONTRAST TECHNIQUE: Multidetector CT imaging of the abdomen and pelvis was performed using the standard protocol following bolus administration of intravenous contrast. CONTRAST:  132mL ISOVUE-300 IOPAMIDOL (ISOVUE-300) INJECTION 61% COMPARISON:  Right upper quadrant ultrasound performed 02/23/2011, and CT of the abdomen and pelvis from 12/22/2013 FINDINGS: Lower chest: Diffuse coronary artery calcifications are seen. The visualized lung bases are clear. Hepatobiliary: The liver is unremarkable in appearance. A few stones are noted within the gallbladder. The gallbladder is otherwise unremarkable. The common bile duct remains normal in caliber. Pancreas: The pancreas is within normal limits. Spleen: The spleen is unremarkable in appearance. Adrenals/Urinary Tract: The adrenal glands are unremarkable in appearance. The kidneys are within normal limits. There is no evidence of hydronephrosis. No renal or ureteral stones are identified. No perinephric stranding is seen. Stomach/Bowel: Multiple vague multilobulated soft tissue masses are seen tracking about the small bowel mesentery, measuring up to 2.7 cm in size, extending directly adjacent to small bowel loops. Surrounding soft tissue inflammation is seen, and mildly prominent mesenteric nodes are noted more proximally. These findings are new from 2015. This raises concern for metastatic disease, though desmoid tumor, sclerosing mesenteritis or other inflammatory conditions might have a  similar appearance. An ileocolic anastomosis is grossly unremarkable in appearance. The remaining colon is grossly unremarkable in appearance. The colon is grossly unremarkable in appearance. Vascular/Lymphatic: Scattered calcification is seen along the abdominal aorta and its branches. The abdominal aorta is otherwise grossly unremarkable. The inferior vena cava is grossly unremarkable. No retroperitoneal lymphadenopathy is seen. No pelvic sidewall lymphadenopathy is identified. Reproductive: The bladder is moderately distended and grossly unremarkable. The patient is status post hysterectomy. No suspicious adnexal masses are seen. Other: No additional soft tissue abnormalities are seen. Musculoskeletal: No acute osseous abnormalities are identified. Vacuum phenomenon is noted at L4-L5. The visualized musculature is unremarkable in appearance. IMPRESSION: 1. Multiple vague multilobulated soft tissue masses tracking about the small bowel mesentery, measuring up to 2.7 cm in size, extending directly adjacent to multiple small bowel loops. Surrounding soft tissue inflammation seen, with mildly prominent mesenteric nodes more proximally. These findings are new from 2015. This raises concern for metastatic disease, though desmoid tumor, sclerosing mesenteritis or other inflammatory conditions might have a similar appearance, given the patient's history of Crohn's disease. PET/CT could be performed for further evaluation. 2. Diffuse coronary artery calcifications seen. 3. Cholelithiasis.  Gallbladder otherwise unremarkable. 4. Scattered aortic atherosclerosis. Electronically Signed   By: Garald Balding M.D.   On: 05/18/2016 02:50   Dg Abd 2 Views  Result Date: 05/17/2016 CLINICAL DATA:  Initial evaluation for acute abdominal pain. History of prior SP of. EXAM: ABDOMEN - 2 VIEW COMPARISON:  Prior CT from 12/22/2013. FINDINGS: Multiple prominent air and fluid-filled loops of bowel seen scattered throughout the abdomen.  Air-fluid level seen on upright projection. Gas is present within the colon. No free air. No soft tissue mass or abnormal calcification. Suture material  overlies the right abdomen. No acute osseus abnormality. IMPRESSION: Multiple prominent loops of small bowel scattered throughout the abdomen with air-fluid levels on upright projection. Finding somewhat suspicious for possible small bowel obstruction. Follow-up exam with cross-sectional imaging could be performed for further evaluation as warranted. Electronically Signed   By: Jeannine Boga M.D.   On: 05/17/2016 23:07    EKG:   Orders placed or performed during the hospital encounter of 05/17/16  . ED EKG  . ED EKG  . ED EKG  . ED EKG  . EKG 12-Lead  . EKG 12-Lead  . EKG 12-Lead  . EKG 12-Lead  . EKG 12-Lead    ASSESSMENT AND PLAN:    This is a 71 y.o. female with a history of Crohn's,. DM, hypothyroid, collagen vascular disease, pernicious anemia, ankylosing spondylitis, HLD, OA, PUD, RA, h/o SBO  now being admitted with:  #. Abdominal pain, prob Crohn's flare. -full liquid diet With Glucerna supplements until she sees her gastroenterology Dr. Emelda Fear as an outpatient next week -Budesonide 9 mg once daily -Supportive treatment, pain management as needed - Gentle IVFs given in - Discussed with patient's primary gastroenterologist Dr. Emelda Fear, CT abdomen results faxed to her office as per her request-[phone], will send a copy of the CT at the time of discharge with the patient.She has referred to patient's previous MRI of the abdomen performed in 2017 Recommending colonoscopy if no clinical improvement otherwise outpatient follow-up with Dr. Emelda Fear in a week after discharge -Appreciate GI Dr.Kosher's recommendations - Surgical consult -no obstruction identified no need of invasive procedures at this time - Stool for C. difficile is negative. Occult blood is negative. GI panel negative  #. Hypokalemia and acute on chronic  hypomagnesemia, likely secondary to diarrhea and decreased PO intake. - Replaced IV - Repeat potassium 3.7 K and Mg given in ED - Recheck CMP in AM  #. H/o Diabetes - Accuchecks achs with RISS coverage - Clear liquids, advance as tolerated  #. History of hypothyroidism - Continue Synthroid, Armour    All the records are reviewed and case discussed with Care Management/Social Workerr. Management plans discussed with the patient, family and they are in agreement.  CODE STATUS: fc   TOTAL TIME TAKING CARE OF THIS PATIENT: 36  minutes.   POSSIBLE D/C IN 1- 2  DAYS, DEPENDING ON CLINICAL CONDITION.  Note: This dictation was prepared with Dragon dictation along with smaller phrase technology. Any transcriptional errors that result from this process are unintentional.   Nicholes Mango M.D on 05/19/2016 at 11:54 AM  Between 7am to 6pm - Pager - [phone] After 6pm go to [email or web address] - password [email or web address] [facility]  Office  [phone]  CC: Primary care physician; Sharyne Peach, MD

## 2016-05-19 NOTE — Plan of Care (Signed)
Problem: Acute Rehab PT Goals(only PT should resolve) Goal: Patient Will Transfer Sit To/From Stand Outcome: Progressing Pt will be able to independently perform sit to stand without UE support to prepare for ambulating to restroom at home Goal: Pt Will Ambulate Outcome: Progressing Patient will be able to amb >181ft to demonstrate significant improvement in amb quality and to be able to amb into the house when arriving at home.  Goal: Pt Will Go Up/Down Stairs Outcome: Progressing Patient will be able to ascend/descend stairs modI to enter the home after D/C from hospital.

## 2016-05-20 ENCOUNTER — Inpatient Hospital Stay: Payer: Federal, State, Local not specified - PPO

## 2016-05-20 LAB — GLUCOSE, CAPILLARY
GLUCOSE-CAPILLARY: 112 mg/dL — AB (ref 65–99)
GLUCOSE-CAPILLARY: 138 mg/dL — AB (ref 65–99)
Glucose-Capillary: 95 mg/dL (ref 65–99)
Glucose-Capillary: 127 mg/dL — ABNORMAL HIGH (ref 65–99)

## 2016-05-20 MED ORDER — CHOLESTYRAMINE LIGHT 4 G PO PACK
2.0000 g | PACK | Freq: Every day | ORAL | Status: DC
  Administered 2016-05-20: 2 g via ORAL
  Filled 2016-05-20: qty 1

## 2016-05-20 NOTE — Progress Notes (Signed)
AVSS. Bad day yesterday with pain, nausea (w/o vomiting) and multiple stools. Better today. Labs reviewed. Anemia, likely chronic disease w/ normal MCV. ABD: Soft. Normal BS. GI note reviewed. Colonoscopy 05/2014 showed no stricture at the ileocolic anastomosis, but biopsy did show active disease. Tubular adenoma warranting 5 year f/u. 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 Have requested SBFT for tomorrow to assess for recurrent strictures.

## 2016-05-20 NOTE — Progress Notes (Signed)
[facility] [place] NAME: Joanna Ortiz    MR#:  [id number]  DATE OF BIRTH:  August 21, 1945  SUBJECTIVE:  CHIEF COMPLAINT:  Patient is reporting diffuse abdominal pain however it's better than yesterday. Tolerating clear liquid diet.Ambulating in the hall  REVIEW OF SYSTEMS:  CONSTITUTIONAL: No fever, fatigue or weakness.  EYES: No blurred or double vision.  EARS, NOSE, AND THROAT: No tinnitus or ear pain.  RESPIRATORY: No cough, shortness of breath, wheezing or hemoptysis.  CARDIOVASCULAR: No chest pain, orthopnea, edema.  GASTROINTESTINAL: No nausea, vomiting, diarrhea abd. Reporting diffuse abdominal pain.  GENITOURINARY: No dysuria, hematuria.  ENDOCRINE: No polyuria, nocturia,  HEMATOLOGY: No anemia, easy bruising or bleeding SKIN: No rash or lesion. MUSCULOSKELETAL: No joint pain or arthritis.   NEUROLOGIC: No tingling, numbness, weakness.  PSYCHIATRY: No anxiety or depression.   DRUG ALLERGIES:   Allergies  Allergen Reactions  . Erythromycin Hives  . Hydromorphone Hives  . Cefuroxime Nausea And Vomiting  . Ciprocinonide [Fluocinolone]     Muscle pain   . Codeine     Other reaction(s): Vomiting  . Floxin [Ofloxacin] Nausea And Vomiting  . Keflex [Cephalexin] Nausea And Vomiting  . Levofloxacin Nausea And Vomiting  . Sulfa Antibiotics Nausea And Vomiting  . Ciprofloxacin Nausea And Vomiting    VITALS:  Blood pressure 126/67, pulse 81, temperature 98 F (36.7 C), temperature source Oral, resp. rate 12, height 5\' 8"  (1.727 m), weight 78.9 kg (174 lb), SpO2 99 %.  PHYSICAL EXAMINATION:  GENERAL:  71 y.o.-year-old patient lying in the bed with no acute distress.  EYES: Pupils equal, round, reactive to light and accommodation. No scleral icterus. Extraocular muscles intact.  HEENT: Head atraumatic, normocephalic. Oropharynx and nasopharynx clear.  NECK:  Supple, no jugular venous distention. No thyroid enlargement,  no tenderness.  LUNGS: Normal breath sounds bilaterally, no wheezing, rales,rhonchi or crepitation. No use of accessory muscles of respiration.  CARDIOVASCULAR: S1, S2 normal. No murmurs, rubs, or gallops.  ABDOMEN: Soft, min Generalized abdominal tenderness, no rebound tenderness, nondistended. Bowel sounds present. No organomegaly or mass.  EXTREMITIES: No pedal edema, cyanosis, or clubbing.  NEUROLOGIC: Cranial nerves II through XII are intact. Muscle strength 5/5 in all extremities. Sensation intact. Gait not checked.  PSYCHIATRIC: The patient is alert and oriented x 3.  SKIN: No obvious rash, lesion, or ulcer.    LABORATORY PANEL:   CBC  Recent Labs Lab 05/19/16 0327  WBC 5.9  HGB 8.8*  HCT 25.9*  PLT 171   ------------------------------------------------------------------------------------------------------------------  Chemistries   Recent Labs Lab 05/18/16 0649  05/18/16 1756 05/19/16 1228  NA 139  < > 139  --   K 2.7*  < > 2.9* 3.5  CL 112*  < > 111  --   CO2 23  < > 23  --   GLUCOSE 153*  < > 142*  --   BUN 5*  < > <5*  --   CREATININE 0.63  < > 0.60  --   CALCIUM 7.6*  < > 7.5*  --   MG 1.9  --   --   --   AST 10*  < > 13*  --   ALT 13*  < > 14  --   ALKPHOS 40  < > 43  --   BILITOT 0.7  < > 0.6  --   < > = values in this interval not displayed. ------------------------------------------------------------------------------------------------------------------  Cardiac Enzymes No results for input(s):  TROPONINI in the last 168 hours. ------------------------------------------------------------------------------------------------------------------  RADIOLOGY:  US Venous Img Upper Bilat  Result Date: 05/20/2016 CLINICAL DATA:  Chronic bilateral upper extremity edema. EXAM: BILATERAL UPPER EXTREMITY VENOUS DOPPLER ULTRASOUND TECHNIQUE: Gray-scale sonography with graded compression, as well as color Doppler and duplex ultrasound were performed to evaluate the  bilateral upper extremity deep venous systems from the level of the subclavian vein and including the jugular, axillary, basilic, radial, ulnar and upper cephalic vein. Spectral Doppler was utilized to evaluate flow at rest and with distal augmentation maneuvers. COMPARISON:  None. FINDINGS: RIGHT UPPER EXTREMITY Internal Jugular Vein: No evidence of thrombus. Normal compressibility, respiratory phasicity and response to augmentation. Subclavian Vein: No evidence of thrombus. Normal compressibility, respiratory phasicity and response to augmentation. Axillary Vein: No evidence of thrombus. Normal compressibility, respiratory phasicity and response to augmentation. Cephalic Vein: No evidence of thrombus. Normal compressibility, respiratory phasicity and response to augmentation. Basilic Vein: No evidence of thrombus. Normal compressibility, respiratory phasicity and response to augmentation. Brachial Veins: No evidence of thrombus. Normal compressibility, respiratory phasicity and response to augmentation. Radial Veins: No evidence of thrombus. Normal compressibility, respiratory phasicity and response to augmentation. Ulnar Veins: No evidence of thrombus. Normal compressibility, respiratory phasicity and response to augmentation. Venous Reflux:  None. Other Findings:  None. LEFT UPPER EXTREMITY Internal Jugular Vein: No evidence of thrombus. Normal compressibility, respiratory phasicity and response to augmentation. Subclavian Vein: No evidence of thrombus. Normal compressibility, respiratory phasicity and response to augmentation. Axillary Vein: No evidence of thrombus. Normal compressibility, respiratory phasicity and response to augmentation. Cephalic Vein: No evidence of thrombus. Normal compressibility, respiratory phasicity and response to augmentation. Basilic Vein: No evidence of thrombus. Normal compressibility, respiratory phasicity and response to augmentation. Brachial Veins: No evidence of thrombus.  Normal compressibility, respiratory phasicity and response to augmentation. Radial Veins: No evidence of thrombus. Normal compressibility, respiratory phasicity and response to augmentation. Ulnar Veins: No evidence of thrombus. Normal compressibility, respiratory phasicity and response to augmentation. Venous Reflux:  None. Other Findings:  None. IMPRESSION: No evidence of deep venous thrombosis in either upper extremity. Electronically Signed   By: Ilona Sorrel M.D.   On: 05/20/2016 12:54    EKG:   Orders placed or performed during the hospital encounter of 05/17/16  . ED EKG  . ED EKG  . ED EKG  . ED EKG  . EKG 12-Lead  . EKG 12-Lead  . EKG 12-Lead  . EKG 12-Lead  . EKG 12-Lead    ASSESSMENT AND PLAN:    This is a 71 y.o. female with a history of Crohn's,. DM, hypothyroid, collagen vascular disease, pernicious anemia, ankylosing spondylitis, HLD, OA, PUD, RA, h/o SBO  now being admitted with:  #. Abdominal pain, prob Crohn's flare. -full liquid diet With Glucerna supplements until she sees her gastroenterology Dr. Emelda Fear as an outpatient next week.Patient prefers seeing Dr. Fransisca Kaufmann -Budesonide 9 mg once daily -Questran is added to the regimen -Supportive treatment, pain management as needed - Gentle IVFs given  - Discussed with patient's primary gastroenterologist Dr. Emelda Fear, CT abdomen results faxed to her office as per her request-[phone], will send a copy of the CT at the time of discharge with the patient.She has referred to patient's previous MRI of the abdomen performed in 2017 Recommending colonoscopy if no clinical improvement otherwise outpatient follow-up with Dr. Emelda Fear in a week after discharge -Appreciate GI Dr.Kosher's recommendations - Surgical consult -no obstruction identified no need of invasive procedures at this time - Stool for C. difficile  is negative. Occult blood is negative. GI panel negative  #. Hypokalemia and acute on chronic hypomagnesemia, likely  secondary to diarrhea and decreased PO intake. - Replaced IV - Repeat potassium 3.7 K and Mg given in ED - Recheck CMP in AM  #. H/o Diabetes - Accuchecks achs with RISS coverage - Full liquids until seen by gastroenterology as an outpatient  #. History of hypothyroidism - Continue Synthroid, Armour  No PT needs identified by physical therapy  All the records are reviewed and case discussed with Care Management/Social Workerr. Management plans discussed with the patient, family and they are in agreement.  CODE STATUS: fc   TOTAL TIME TAKING CARE OF THIS PATIENT: 36  minutes.   POSSIBLE D/C IN am  DAYS, DEPENDING ON CLINICAL CONDITION.  Note: This dictation was prepared with Dragon dictation along with smaller phrase technology. Any transcriptional errors that result from this process are unintentional.   Nicholes Mango M.D on 05/20/2016 at 1:19 PM  Between 7am to 6pm - Pager - [phone] After 6pm go to [email or web address] - password [email or web address] [facility]  Office  [phone]  CC: Primary care physician; Sharyne Peach, MD

## 2016-05-20 NOTE — Progress Notes (Addendum)
Natasha Bence Roselle is being followed for stricturing Crohn's disease Day 2 of follow up   Subjective: Patient had a bad night last night with significant abdominal pain and diarrhea She received budesonide yesterday evening, ambulated the floor without difficulty and has been on a full liquid diet   Objective: Vital signs in last 24 hours: Vitals:   05/19/16 1442 05/19/16 2051 05/20/16 0622 05/20/16 1314  BP:  139/68 (!) 113/54 126/67  Pulse: 90 93 84 81  Resp:  20 20 12   Temp:  98.6 F (37 C) 98 F (36.7 C) 98 F (36.7 C)  TempSrc:  Oral Oral Oral  SpO2: 98% 100% 98% 99%  Weight:      Height:       Weight change:   Intake/Output Summary (Last 24 hours) at 05/20/16 1316 Last data filed at 05/20/16 0957  Gross per 24 hour  Intake             3692 ml  Output              600 ml  Net             3092 ml     Exam:  General: Older white female resting comfortably in a hospital bed in NAD Head: Normocephalic and atraumatic. Eyes: No scleral icterus Ears:  Normal auditory acuity. Lungs: Respirations even and unlabored.  Heart: Regular rate and rhythm Abdomen: +BS, soft, NT, distended, but not tensely so and less than yesterday's exam Rectal: deferred Msk: Symmetrical without gross deformities Extremities: bilateral upper extremity swelling without erythema or warmth, no lower extremity edema or erythema Neurologic: alert, grossly normal neurologically. Skin: intact without significant lesions or rashes. Psych: Cooperative, appropriate affect  Lab Results: @LABTEST2 @ Micro Results: Recent Results (from the past 240 hour(s))  Urine culture     Status: Abnormal   Collection Time: 05/17/16 11:10 PM  Result Value Ref Range Status   Specimen Description URINE, CLEAN CATCH  Final   Special Requests Normal  Final   Culture MULTIPLE SPECIES PRESENT, SUGGEST RECOLLECTION (A)  Final   Report Status 05/19/2016 FINAL  Final  Blood Culture (routine x 2)     Status: None  (Preliminary result)   Collection Time: 05/17/16 11:10 PM  Result Value Ref Range Status   Specimen Description BLOOD RIGHT [facility]  Final   Special Requests   Final    Blood Culture results may not be optimal due to an excessive volume of blood received in culture bottles   Culture NO GROWTH 3 DAYS  Final   Report Status PENDING  Incomplete  Blood Culture (routine x 2)     Status: None (Preliminary result)   Collection Time: 05/17/16 11:10 PM  Result Value Ref Range Status   Specimen Description BLOOD RIGHT FA  Final   Special Requests   Final    Blood Culture results may not be optimal due to an excessive volume of blood received in culture bottles   Culture NO GROWTH 3 DAYS  Final   Report Status PENDING  Incomplete  C difficile quick scan w PCR reflex     Status: None   Collection Time: 05/18/16  2:29 AM  Result Value Ref Range Status   C Diff antigen NEGATIVE NEGATIVE Final   C Diff toxin NEGATIVE NEGATIVE Final   C Diff interpretation No C. difficile detected.  Final  Gastrointestinal Panel by PCR , Stool     Status: None   Collection Time: 05/18/16  2:29 AM  Result Value Ref Range Status   Campylobacter species NOT DETECTED NOT DETECTED Final   Plesimonas shigelloides NOT DETECTED NOT DETECTED Final   Salmonella species NOT DETECTED NOT DETECTED Final   Yersinia enterocolitica NOT DETECTED NOT DETECTED Final   Vibrio species NOT DETECTED NOT DETECTED Final   Vibrio cholerae NOT DETECTED NOT DETECTED Final   Enteroaggregative E coli (EAEC) NOT DETECTED NOT DETECTED Final   Enteropathogenic E coli (EPEC) NOT DETECTED NOT DETECTED Final   Enterotoxigenic E coli (ETEC) NOT DETECTED NOT DETECTED Final   Shiga like toxin producing E coli (STEC) NOT DETECTED NOT DETECTED Final   Shigella/Enteroinvasive E coli (EIEC) NOT DETECTED NOT DETECTED Final   Cryptosporidium NOT DETECTED NOT DETECTED Final   Cyclospora cayetanensis NOT DETECTED NOT DETECTED Final   Entamoeba histolytica NOT  DETECTED NOT DETECTED Final   Giardia lamblia NOT DETECTED NOT DETECTED Final   Adenovirus F40/41 NOT DETECTED NOT DETECTED Final   Astrovirus NOT DETECTED NOT DETECTED Final   Norovirus GI/GII NOT DETECTED NOT DETECTED Final   Rotavirus A NOT DETECTED NOT DETECTED Final   Sapovirus (I, II, IV, and V) NOT DETECTED NOT DETECTED Final   Studies/Results: US Venous Img Upper Bilat  Result Date: 05/20/2016 CLINICAL DATA:  Chronic bilateral upper extremity edema. EXAM: BILATERAL UPPER EXTREMITY VENOUS DOPPLER ULTRASOUND TECHNIQUE: Gray-scale sonography with graded compression, as well as color Doppler and duplex ultrasound were performed to evaluate the bilateral upper extremity deep venous systems from the level of the subclavian vein and including the jugular, axillary, basilic, radial, ulnar and upper cephalic vein. Spectral Doppler was utilized to evaluate flow at rest and with distal augmentation maneuvers. COMPARISON:  None. FINDINGS: RIGHT UPPER EXTREMITY Internal Jugular Vein: No evidence of thrombus. Normal compressibility, respiratory phasicity and response to augmentation. Subclavian Vein: No evidence of thrombus. Normal compressibility, respiratory phasicity and response to augmentation. Axillary Vein: No evidence of thrombus. Normal compressibility, respiratory phasicity and response to augmentation. Cephalic Vein: No evidence of thrombus. Normal compressibility, respiratory phasicity and response to augmentation. Basilic Vein: No evidence of thrombus. Normal compressibility, respiratory phasicity and response to augmentation. Brachial Veins: No evidence of thrombus. Normal compressibility, respiratory phasicity and response to augmentation. Radial Veins: No evidence of thrombus. Normal compressibility, respiratory phasicity and response to augmentation. Ulnar Veins: No evidence of thrombus. Normal compressibility, respiratory phasicity and response to augmentation. Venous Reflux:  None. Other  Findings:  None. LEFT UPPER EXTREMITY Internal Jugular Vein: No evidence of thrombus. Normal compressibility, respiratory phasicity and response to augmentation. Subclavian Vein: No evidence of thrombus. Normal compressibility, respiratory phasicity and response to augmentation. Axillary Vein: No evidence of thrombus. Normal compressibility, respiratory phasicity and response to augmentation. Cephalic Vein: No evidence of thrombus. Normal compressibility, respiratory phasicity and response to augmentation. Basilic Vein: No evidence of thrombus. Normal compressibility, respiratory phasicity and response to augmentation. Brachial Veins: No evidence of thrombus. Normal compressibility, respiratory phasicity and response to augmentation. Radial Veins: No evidence of thrombus. Normal compressibility, respiratory phasicity and response to augmentation. Ulnar Veins: No evidence of thrombus. Normal compressibility, respiratory phasicity and response to augmentation. Venous Reflux:  None. Other Findings:  None. IMPRESSION: No evidence of deep venous thrombosis in either upper extremity. Electronically Signed   By: Ilona Sorrel M.D.   On: 05/20/2016 12:54   Medications: I have reviewed the patient's current medications. Scheduled Meds: . budesonide  9 mg Oral Daily  . cholestyramine light  2 g Oral Daily  . feeding supplement (GLUCERNA  SHAKE)  237 mL Oral TID BM  . heparin  5,000 Units Subcutaneous Q8H  . insulin aspart  0-15 Units Subcutaneous TID WC  . insulin aspart  0-5 Units Subcutaneous QHS  . iopamidol  30 mL Oral Once  . levothyroxine  100 mcg Oral QAC breakfast  . multivitamin with minerals  1 tablet Oral Daily  . potassium chloride  40 mEq Oral Daily  . thyroid  30 mg Oral [id number]   Continuous Infusions: . sodium chloride 75 mL/hr at 05/20/16 0955   PRN Meds:.acetaminophen **OR** acetaminophen, albuterol, bisacodyl, ipratropium, magnesium citrate, ondansetron **OR** ondansetron (ZOFRAN) IV,  senna-docusate   Assessment: Active Problems:   Abdominal pain  Ms. Kirkendoll is a 71 y.o. 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 The patient's symptoms are most consistent with fibrotic versus inflammatory (or both) stricturing. In order to minimize steroid therapy, I recommend temporizing with enteral steroids and a full liquid diet. She will need a colonoscopy with an IBD provider to assess for the feasibility of an endoscopic dilation versus surgical management. Her diarrhea is also likely multi-factorial: bile acid malabsorption, fat malabsorption, active luminal Crohn's disease and less likely SIBO. Budesonide typically helps with small bowel fluid reabsorption as well, which could help ameliorate diarrhea.   Plan:  - discontinue IVF - d/c home on a full liquid diet - continue budesonide 9mg  daily until outpatient GI follow-up: she should have a colonoscopy in the next 1-2 weeks with clinic follow up shortly thereafter - ensure that cholestyramine is spaced 2 hours from ALL other medications - glucerna tid     LOS: 2 days   Finnis Colee 05/20/2016, 1:16 PM

## 2016-05-21 ENCOUNTER — Inpatient Hospital Stay: Payer: Federal, State, Local not specified - PPO

## 2016-05-21 LAB — GLUCOSE, CAPILLARY: Glucose-Capillary: 112 mg/dL — ABNORMAL HIGH (ref 65–99)

## 2016-05-21 MED ORDER — GLUCERNA SHAKE PO LIQD: 237.0000 mL | Freq: Three times a day (TID) | ORAL | 0 refills | Status: DC

## 2016-05-21 MED ORDER — BUDESONIDE 3 MG PO CPEP: 9.0000 mg | ORAL_CAPSULE | Freq: Every day | ORAL | 0 refills | Status: DC

## 2016-05-21 NOTE — Discharge Summary (Signed)
[facility] NAME: Joanna Ortiz    MR#:  [id number]  DATE OF BIRTH:  1945/02/28  DATE OF ADMISSION:  05/17/2016 ADMITTING PHYSICIAN: Harvie Bridge, DO  DATE OF DISCHARGE: 05/21/16 PRIMARY CARE PHYSICIAN: Sharyne Peach, MD    ADMISSION DIAGNOSIS:  Anorexia [R63.0] Hypokalemia [E87.6] Generalized abdominal pain [R10.84] Fever [R50.9] Fever, unspecified fever cause [R50.9]  DISCHARGE DIAGNOSIS:  Active Problems:   Abdominal pain   SECONDARY DIAGNOSIS:   Past Medical History:  Diagnosis Date  . Collagen vascular disease ([place])   . Crohn disease ([place])   . Diabetes mellitus without complication ([place])   . Thyroid disease     HOSPITAL COURSE:   HPI: Joanna Ortiz is a 71 y.o. female with a known history of Crohn's,. DM, hypothyroid, collagen vascular disease, Anemia, ankylosing spondylitis, HLD, OA, PUD, RA, h/o SBO presents to the emergency department for evaluation of abdominal pain.  Patient was in a usual state of health until several days ago when she began experiencing bloating, abdominal pain, fevers, weakness and loose stool.   Abdominal pain is intermittent, worse with food intake and associated with several watery stools over the last 3 days. Patient has diarrhea at baseline but this is significantly different.  Decreased PO intake secondary to above.    Patient denies dizziness, chest pain, shortness of breath,bloody stools, dysuria/frequency, changes in mental status.    Of note patient was recently on antibiotics for bronchitis.  She also has a history of CDiff in July 2017.   Otherwise there has been no change in status. Patient has been taking medication as prescribed and there has been no recent change in medication or diet. There has been no recent travel or sick contacts.    #. Abdominal pain, prob Crohn's flare. -SBFT - No strictures -full liquid diet  advanced to soft diet by Dr. Bary Castilla ,  continue With Glucerna supplements until she sees her gastroenterology Dr. Emelda Fear as an outpatient next week.Patient might consider seeing Dr. Fransisca Kaufmann -Budesonide 9 mg once daily, holding Humira for now -Continue Questran and Colestid -Supportive treatment, pain management as needed - Gentle IVFs given  - Discussed with patient's primary gastroenterologist Dr. Emelda Fear, CT abdomen results faxed to her office as per her request-[phone], will send a CD  copy of the CT and SBFT  at the time of discharge with the patient.She has referred to patient's previous MRI of the abdomen performed in 2017 Recommending colonoscopy if no clinical improvement otherwise outpatient follow-up with Dr. Emelda Fear in a week after discharge -Appreciate GI Dr.Kosher's recommendations - Surgical consult -no obstruction identified no need of invasive procedures at this time - Stool for C. difficile is negative. Occult blood is negative. GI panel negative  #. Hypokalemia and acute on chronic hypomagnesemia, likely secondary to diarrhea and decreased PO intake. - Replaced IV - Repeat potassium 3.7 K and Mg given in ED - #. H/o Diabetes - Accuchecks achs with RISS coverage during the hospital course - soft diet  until seen by gastroenterology as an outpatient Holding glipizide for now  #. History of hypothyroidism - Continue Synthroid, Armour  No PT needs identified by physical therapy  DISCHARGE CONDITIONS:   stable  CONSULTS OBTAINED:  Treatment Team:  Benard Rink, MD Lisbeth Renshaw, MD [name], MD   PROCEDURES none  DRUG ALLERGIES:   Allergies  Allergen Reactions  . Erythromycin Hives  . Hydromorphone Hives  . Cefuroxime Nausea And  Vomiting  . Ciprocinonide [Fluocinolone]     Muscle pain   . Codeine     Other reaction(s): Vomiting  . Floxin [Ofloxacin] Nausea And Vomiting  . Keflex [Cephalexin] Nausea And Vomiting  . Levofloxacin Nausea And Vomiting  . Sulfa Antibiotics  Nausea And Vomiting  . Ciprofloxacin Nausea And Vomiting    DISCHARGE MEDICATIONS:   Current Discharge Medication List    START taking these medications   Details  budesonide (ENTOCORT EC) 3 MG 24 hr capsule Take 3 capsules (9 mg total) by mouth daily. Qty: 30 capsule, Refills: 0    feeding supplement, GLUCERNA SHAKE, (GLUCERNA SHAKE) LIQD Take 237 mLs by mouth 3 (three) times daily between meals. Qty: 90 Can, Refills: 0      CONTINUE these medications which have NOT CHANGED   Details  acetaminophen (TYLENOL) 325 MG tablet Take 2 tablets (650 mg total) by mouth every 6 (six) hours as needed for mild pain (or Fever >/= 101).    albuterol (PROVENTIL HFA;VENTOLIN HFA) 108 (90 Base) MCG/ACT inhaler Inhale 2 puffs into the lungs every 6 (six) hours as needed for wheezing or shortness of breath. Qty: 1 Inhaler, Refills: 2    cholestyramine (QUESTRAN) 4 G packet Take 4 g by mouth 2 (two) times daily as needed (stomach problems).  Refills: 12    colestipol (COLESTID) 1 G tablet Take 1 g by mouth daily as needed (for diarrhea).     cyanocobalamin (,VITAMIN B-12,) 1000 MCG/ML injection Inject 1,000 mcg into the muscle every 30 (thirty) days.    Multiple Vitamin (MULTIVITAMIN) tablet Take 1 tablet by mouth daily.    Omega-3 Fatty Acids (FISH OIL) 1000 MG CAPS Take 1,000 mg by mouth daily.     potassium chloride (K-DUR,KLOR-CON) 10 MEQ tablet Take 4 tablets by mouth daily. For 7 days    SYNTHROID 100 MCG tablet Take 100 mcg by mouth daily before breakfast.  Refills: 11    thyroid (ARMOUR) 30 MG tablet Take 30 mg by mouth every morning.    TURMERIC CURCUMIN PO Take 1 capsule by mouth daily.    traMADol (ULTRAM) 50 MG tablet Take 1 tablet (50 mg total) by mouth every 6 (six) hours as needed. Qty: 20 tablet, Refills: 0      STOP taking these medications     glipiZIDE (GLUCOTROL XL) 5 MG 24 hr tablet      HUMIRA PEN 40 MG/0.8ML PNKT      doxycycline (VIBRA-TABS) 100 MG tablet       predniSONE (STERAPRED UNI-PAK 21 TAB) 10 MG (21) TBPK tablet          DISCHARGE INSTRUCTIONS:   Follow-up with primary care physician in a week Follow-up with gastroenterology in 5-7 days, take copy of the CD Follow-up with surgery in one month  DIET:  Soft diet with Glucerna supplements  DISCHARGE CONDITION:  Stable  ACTIVITY:  Activity as tolerated  OXYGEN:  Home Oxygen: No.   Oxygen Delivery: room air  DISCHARGE LOCATION:  home   If you experience worsening of your admission symptoms, develop shortness of breath, life threatening emergency, suicidal or homicidal thoughts you must seek medical attention immediately by calling 911 or calling your MD immediately  if symptoms less severe.  You Must read complete instructions/literature along with all the possible adverse reactions/side effects for all the Medicines you take and that have been prescribed to you. Take any new Medicines after you have completely understood and accpet all the possible adverse reactions/side  effects.   Please note  You were cared for by a hospitalist during your hospital stay. If you have any questions about your discharge medications or the care you received while you were in the hospital after you are discharged, you can call the unit and asked to speak with the hospitalist on call if the hospitalist that took care of you is not available. Once you are discharged, your primary care physician will handle any further medical issues. Please note that NO REFILLS for any discharge medications will be authorized once you are discharged, as it is imperative that you return to your primary care physician (or establish a relationship with a primary care physician if you do not have one) for your aftercare needs so that they can reassess your need for medications and monitor your lab values.     Today  Chief Complaint  Patient presents with  . Hyperglycemia   Patient is doing fine. Tolerating full  liquid diet. Abdominal pain significantly improved. Small bowel follow-through with no strictures today. Okay to advance diet to soft by Dr. Bary Castilla  ROS:  CONSTITUTIONAL: Denies fevers, chills. Denies any fatigue, weakness.  EYES: Denies blurry vision, double vision, eye pain. EARS, NOSE, THROAT: Denies tinnitus, ear pain, hearing loss. RESPIRATORY: Denies cough, wheeze, shortness of breath.  CARDIOVASCULAR: Denies chest pain, palpitations, edema.  GASTROINTESTINAL: Denies nausea, vomiting, diarrhea, abdominal pain. Denies bright red blood per rectum. GENITOURINARY: Denies dysuria, hematuria. ENDOCRINE: Denies nocturia or thyroid problems. HEMATOLOGIC AND LYMPHATIC: Denies easy bruising or bleeding. SKIN: Denies rash or lesion. MUSCULOSKELETAL: Denies pain in neck, back, shoulder, knees, hips or arthritic symptoms.  NEUROLOGIC: Denies paralysis, paresthesias.  PSYCHIATRIC: Denies anxiety or depressive symptoms.   VITAL SIGNS:  Blood pressure 115/63, pulse 84, temperature 98.3 F (36.8 C), temperature source Oral, resp. rate 20, height 5\' 8"  (1.727 m), weight 78.9 kg (174 lb), SpO2 100 %.  I/O:    Intake/Output Summary (Last 24 hours) at 05/21/16 1429 Last data filed at 05/21/16 1100  Gross per 24 hour  Intake             2899 ml  Output                0 ml  Net             2899 ml    PHYSICAL EXAMINATION:  GENERAL:  71 y.o.-year-old patient lying in the bed with no acute distress.  EYES: Pupils equal, round, reactive to light and accommodation. No scleral icterus. Extraocular muscles intact.  HEENT: Head atraumatic, normocephalic. Oropharynx and nasopharynx clear.  NECK:  Supple, no jugular venous distention. No thyroid enlargement, no tenderness.  LUNGS: Normal breath sounds bilaterally, no wheezing, rales,rhonchi or crepitation. No use of accessory muscles of respiration.  CARDIOVASCULAR: S1, S2 normal. No murmurs, rubs, or gallops.  ABDOMEN: Soft, non-tender,  non-distended. Bowel sounds present. No organomegaly or mass.  EXTREMITIES: No pedal edema, cyanosis, or clubbing.  NEUROLOGIC: Cranial nerves II through XII are intact. Muscle strength 5/5 in all extremities. Sensation intact. Gait not checked.  PSYCHIATRIC: The patient is alert and oriented x 3.  SKIN: No obvious rash, lesion, or ulcer.   DATA REVIEW:   CBC  Recent Labs Lab 05/19/16 0327  WBC 5.9  HGB 8.8*  HCT 25.9*  PLT 171    Chemistries   Recent Labs Lab 05/18/16 0649  05/18/16 1756 05/19/16 1228  NA 139  < > 139  --   K 2.7*  < >  2.9* 3.5  CL 112*  < > 111  --   CO2 23  < > 23  --   GLUCOSE 153*  < > 142*  --   BUN 5*  < > <5*  --   CREATININE 0.63  < > 0.60  --   CALCIUM 7.6*  < > 7.5*  --   MG 1.9  --   --   --   AST 10*  < > 13*  --   ALT 13*  < > 14  --   ALKPHOS 40  < > 43  --   BILITOT 0.7  < > 0.6  --   < > = values in this interval not displayed.  Cardiac Enzymes No results for input(s): TROPONINI in the last 168 hours.  Microbiology Results  Results for orders placed or performed during the hospital encounter of 05/17/16  Urine culture     Status: Abnormal   Collection Time: 05/17/16 11:10 PM  Result Value Ref Range Status   Specimen Description URINE, CLEAN CATCH  Final   Special Requests Normal  Final   Culture MULTIPLE SPECIES PRESENT, SUGGEST RECOLLECTION (A)  Final   Report Status 05/19/2016 FINAL  Final  Blood Culture (routine x 2)     Status: None (Preliminary result)   Collection Time: 05/17/16 11:10 PM  Result Value Ref Range Status   Specimen Description BLOOD RIGHT [facility]  Final   Special Requests   Final    Blood Culture results may not be optimal due to an excessive volume of blood received in culture bottles   Culture NO GROWTH 4 DAYS  Final   Report Status PENDING  Incomplete  Blood Culture (routine x 2)     Status: None (Preliminary result)   Collection Time: 05/17/16 11:10 PM  Result Value Ref Range Status   Specimen  Description BLOOD RIGHT FA  Final   Special Requests   Final    Blood Culture results may not be optimal due to an excessive volume of blood received in culture bottles   Culture NO GROWTH 4 DAYS  Final   Report Status PENDING  Incomplete  C difficile quick scan w PCR reflex     Status: None   Collection Time: 05/18/16  2:29 AM  Result Value Ref Range Status   C Diff antigen NEGATIVE NEGATIVE Final   C Diff toxin NEGATIVE NEGATIVE Final   C Diff interpretation No C. difficile detected.  Final  Gastrointestinal Panel by PCR , Stool     Status: None   Collection Time: 05/18/16  2:29 AM  Result Value Ref Range Status   Campylobacter species NOT DETECTED NOT DETECTED Final   Plesimonas shigelloides NOT DETECTED NOT DETECTED Final   Salmonella species NOT DETECTED NOT DETECTED Final   Yersinia enterocolitica NOT DETECTED NOT DETECTED Final   Vibrio species NOT DETECTED NOT DETECTED Final   Vibrio cholerae NOT DETECTED NOT DETECTED Final   Enteroaggregative E coli (EAEC) NOT DETECTED NOT DETECTED Final   Enteropathogenic E coli (EPEC) NOT DETECTED NOT DETECTED Final   Enterotoxigenic E coli (ETEC) NOT DETECTED NOT DETECTED Final   Shiga like toxin producing E coli (STEC) NOT DETECTED NOT DETECTED Final   Shigella/Enteroinvasive E coli (EIEC) NOT DETECTED NOT DETECTED Final   Cryptosporidium NOT DETECTED NOT DETECTED Final   Cyclospora cayetanensis NOT DETECTED NOT DETECTED Final   Entamoeba histolytica NOT DETECTED NOT DETECTED Final   Giardia lamblia NOT DETECTED NOT DETECTED Final  Adenovirus F40/41 NOT DETECTED NOT DETECTED Final   Astrovirus NOT DETECTED NOT DETECTED Final   Norovirus GI/GII NOT DETECTED NOT DETECTED Final   Rotavirus A NOT DETECTED NOT DETECTED Final   Sapovirus (I, II, IV, and V) NOT DETECTED NOT DETECTED Final    RADIOLOGY:  Dg Chest 2 View  Result Date: 05/17/2016 CLINICAL DATA:  Initial evaluation for acute fever, weakness. EXAM: CHEST  2 VIEW COMPARISON:   Prior radiograph from 05/01/2016. FINDINGS: Cardiac and mediastinal silhouettes are stable in size and contour, and remain within normal limits. Lungs normally inflated. Mild left basilar atelectasis. No consolidative airspace disease. No pulmonary edema or pleural effusion. No pneumothorax. No acute osseus abnormality.  Left shoulder arthroplasty noted. IMPRESSION: Mild left basilar atelectasis. No other active cardiopulmonary disease. Electronically Signed   By: Jeannine Boga M.D.   On: 05/17/2016 23:05   Ct Abdomen Pelvis W Contrast  Result Date: 05/18/2016 CLINICAL DATA:  Acute onset of generalized abdominal discomfort. Hyperglycemia. Initial encounter. EXAM: CT ABDOMEN AND PELVIS WITH CONTRAST TECHNIQUE: Multidetector CT imaging of the abdomen and pelvis was performed using the standard protocol following bolus administration of intravenous contrast. CONTRAST:  147mL ISOVUE-300 IOPAMIDOL (ISOVUE-300) INJECTION 61% COMPARISON:  Right upper quadrant ultrasound performed 02/23/2011, and CT of the abdomen and pelvis from 12/22/2013 FINDINGS: Lower chest: Diffuse coronary artery calcifications are seen. The visualized lung bases are clear. Hepatobiliary: The liver is unremarkable in appearance. A few stones are noted within the gallbladder. The gallbladder is otherwise unremarkable. The common bile duct remains normal in caliber. Pancreas: The pancreas is within normal limits. Spleen: The spleen is unremarkable in appearance. Adrenals/Urinary Tract: The adrenal glands are unremarkable in appearance. The kidneys are within normal limits. There is no evidence of hydronephrosis. No renal or ureteral stones are identified. No perinephric stranding is seen. Stomach/Bowel: Multiple vague multilobulated soft tissue masses are seen tracking about the small bowel mesentery, measuring up to 2.7 cm in size, extending directly adjacent to small bowel loops. Surrounding soft tissue inflammation is seen, and mildly  prominent mesenteric nodes are noted more proximally. These findings are new from 2015. This raises concern for metastatic disease, though desmoid tumor, sclerosing mesenteritis or other inflammatory conditions might have a similar appearance. An ileocolic anastomosis is grossly unremarkable in appearance. The remaining colon is grossly unremarkable in appearance. The colon is grossly unremarkable in appearance. Vascular/Lymphatic: Scattered calcification is seen along the abdominal aorta and its branches. The abdominal aorta is otherwise grossly unremarkable. The inferior vena cava is grossly unremarkable. No retroperitoneal lymphadenopathy is seen. No pelvic sidewall lymphadenopathy is identified. Reproductive: The bladder is moderately distended and grossly unremarkable. The patient is status post hysterectomy. No suspicious adnexal masses are seen. Other: No additional soft tissue abnormalities are seen. Musculoskeletal: No acute osseous abnormalities are identified. Vacuum phenomenon is noted at L4-L5. The visualized musculature is unremarkable in appearance. IMPRESSION: 1. Multiple vague multilobulated soft tissue masses tracking about the small bowel mesentery, measuring up to 2.7 cm in size, extending directly adjacent to multiple small bowel loops. Surrounding soft tissue inflammation seen, with mildly prominent mesenteric nodes more proximally. These findings are new from 2015. This raises concern for metastatic disease, though desmoid tumor, sclerosing mesenteritis or other inflammatory conditions might have a similar appearance, given the patient's history of Crohn's disease. PET/CT could be performed for further evaluation. 2. Diffuse coronary artery calcifications seen. 3. Cholelithiasis.  Gallbladder otherwise unremarkable. 4. Scattered aortic atherosclerosis. Electronically Signed   By: Francoise Schaumann.D.  On: 05/18/2016 02:50   Dg Small Bowel  Result Date: 05/21/2016 CLINICAL DATA:  Duodenum  diverticulum. History of Crohn's disease. Prior small bowel surgery . EXAM: SMALL BOWEL SERIES COMPARISON:  None. TECHNIQUE: Following ingestion of thin barium, serial small bowel images were obtained including spot views of the terminal ileum. FLUOROSCOPY TIME:  Fluoroscopy Time:  0 minutes 48 seconds. Radiation Exposure Index (if provided by the fluoroscopic device): 22.7 mGy. Number of Acquired Spot Images: 24 FINDINGS: Following administration of barium multiple images including spot images obtained of the small bowel. Prominent duodenum diverticula again noted. Diverticuli/pseudo diverticuli noted throughout the small bowel. This may be related to the patient's known Crohn's disease. A process such as scleroderma could present also this fashion as well. No strictures are noted. Terminal ileal region is normal. Oral contrast passes into the colon without difficulty. IMPRESSION: 1.  Prominent duodenum diverticulum. 2. 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 This may be related to the patient's Crohn's disease. 3.  No strictures or obstructing abnormality identified. Electronically Signed   By: Marcello Moores  Register   On: 05/21/2016 11:59   US Venous Img Upper Bilat  Result Date: 05/20/2016 CLINICAL DATA:  Chronic bilateral upper extremity edema. EXAM: BILATERAL UPPER EXTREMITY VENOUS DOPPLER ULTRASOUND TECHNIQUE: Gray-scale sonography with graded compression, as well as color Doppler and duplex ultrasound were performed to evaluate the bilateral upper extremity deep venous systems from the level of the subclavian vein and including the jugular, axillary, basilic, radial, ulnar and upper cephalic vein. Spectral Doppler was utilized to evaluate flow at rest and with distal augmentation maneuvers. COMPARISON:  None. FINDINGS: RIGHT UPPER EXTREMITY Internal Jugular Vein: No evidence of thrombus. Normal compressibility, respiratory phasicity and response to augmentation.  Subclavian Vein: No evidence of thrombus. Normal compressibility, respiratory phasicity and response to augmentation. Axillary Vein: No evidence of thrombus. Normal compressibility, respiratory phasicity and response to augmentation. Cephalic Vein: No evidence of thrombus. Normal compressibility, respiratory phasicity and response to augmentation. Basilic Vein: No evidence of thrombus. Normal compressibility, respiratory phasicity and response to augmentation. Brachial Veins: No evidence of thrombus. Normal compressibility, respiratory phasicity and response to augmentation. Radial Veins: No evidence of thrombus. Normal compressibility, respiratory phasicity and response to augmentation. Ulnar Veins: No evidence of thrombus. Normal compressibility, respiratory phasicity and response to augmentation. Venous Reflux:  None. Other Findings:  None. LEFT UPPER EXTREMITY Internal Jugular Vein: No evidence of thrombus. Normal compressibility, respiratory phasicity and response to augmentation. Subclavian Vein: No evidence of thrombus. Normal compressibility, respiratory phasicity and response to augmentation. Axillary Vein: No evidence of thrombus. Normal compressibility, respiratory phasicity and response to augmentation. Cephalic Vein: No evidence of thrombus. Normal compressibility, respiratory phasicity and response to augmentation. Basilic Vein: No evidence of thrombus. Normal compressibility, respiratory phasicity and response to augmentation. Brachial Veins: No evidence of thrombus. Normal compressibility, respiratory phasicity and response to augmentation. Radial Veins: No evidence of thrombus. Normal compressibility, respiratory phasicity and response to augmentation. Ulnar Veins: No evidence of thrombus. Normal compressibility, respiratory phasicity and response to augmentation. Venous Reflux:  None. Other Findings:  None. IMPRESSION: No evidence of deep venous thrombosis in either upper extremity. Electronically  Signed   By: Ilona Sorrel M.D.   On: 05/20/2016 12:54   Dg Abd 2 Views  Result Date: 05/17/2016 CLINICAL DATA:  Initial evaluation for acute abdominal pain. History of prior SP of. EXAM: ABDOMEN - 2 VIEW COMPARISON:  Prior CT from 12/22/2013. FINDINGS: Multiple prominent air and fluid-filled loops of bowel  seen scattered throughout the abdomen. Air-fluid level seen on upright projection. Gas is present within the colon. No free air. No soft tissue mass or abnormal calcification. Suture material overlies the right abdomen. No acute osseus abnormality. IMPRESSION: Multiple prominent loops of small bowel scattered throughout the abdomen with air-fluid levels on upright projection. Finding somewhat suspicious for possible small bowel obstruction. Follow-up exam with cross-sectional imaging could be performed for further evaluation as warranted. Electronically Signed   By: Jeannine Boga M.D.   On: 05/17/2016 23:07    EKG:   Orders placed or performed during the hospital encounter of 05/17/16  . ED EKG  . ED EKG  . ED EKG  . ED EKG  . EKG 12-Lead  . EKG 12-Lead  . EKG 12-Lead  . EKG 12-Lead  . EKG 12-Lead      Management plans discussed with the patient, family and they are in agreement.  CODE STATUS:     Code Status Orders        Start     Ordered   05/18/16 0445  Full code  Continuous     05/18/16 0444    Code Status History    Date Active Date Inactive Code Status Order ID Comments User Context   05/01/2016  6:03 PM 05/04/2016  5:28 PM Full Code [id number]  Hillary Bow, MD ED      TOTAL TIME TAKING CARE OF THIS PATIENT: 45 minutes.   Note: This dictation was prepared with Dragon dictation along with smaller phrase technology. Any transcriptional errors that result from this process are unintentional.   @[facility] @  on 05/21/2016 at 2:29 PM  Between 7am to 6pm - Pager - [phone]  After 6pm go to [email or web address] - password [email or web address]   Office  [phone]  CC: Primary care physician; Sharyne Peach, MD

## 2016-05-21 NOTE — Discharge Instructions (Signed)
Follow-up with primary care physician in a week Follow-up with gastroenterology in 5-7 days, take copy of the CD Follow-up with surgery in one month

## 2016-05-21 NOTE — Progress Notes (Signed)
[facility]. SBFT completed today. No evidence of stricture or obstruction. Multiple diverticuli, especially in the small bowel. No evidence of anastomotic stricture. ABD: Soft.  Tolerating entocort ec therapy. Have asked the secretary to get a CD w/ CT and SBFT to take to [facility] when she follows up w/ Dr. Emelda Fear.

## 2016-05-22 LAB — CULTURE, BLOOD (ROUTINE X 2)
CULTURE: NO GROWTH
Culture: NO GROWTH

## 2016-05-29 ENCOUNTER — Encounter: Payer: Federal, State, Local not specified - PPO | Attending: Family Medicine | Admitting: Dietician

## 2016-05-29 ENCOUNTER — Encounter: Payer: Self-pay | Admitting: Dietician

## 2016-05-29 VITALS — BP 146/78 | Ht 68.0 in | Wt 175.7 lb

## 2016-05-29 DIAGNOSIS — E119 Type 2 diabetes mellitus without complications: Secondary | ICD-10-CM | POA: Diagnosis not present

## 2016-05-29 DIAGNOSIS — Z713 Dietary counseling and surveillance: Secondary | ICD-10-CM | POA: Insufficient documentation

## 2016-05-29 NOTE — Progress Notes (Signed)
Diabetes Self-Management Education  Visit Type: First/Initial  Appt. Start Time: 0900 Appt. End Time:1015  05/29/2016  [name], identified by name and date of birth, is a 71 y.o. female with a diagnosis of Diabetes: Type 2.   ASSESSMENT  Blood pressure (!) 146/78, height 5\' 8"  (1.727 m), weight 175 lb 11.2 oz (79.7 kg). Body mass index is 26.72 kg/m.      Diabetes Self-Management Education - 05/29/16 1052      Visit Information   Visit Type First/Initial     Initial Visit   Diabetes Type Type 2     Health Coping   How would you rate your overall health? Good     Psychosocial Assessment   Patient Belief/Attitude about Diabetes Motivated to manage diabetes   Self-care barriers None   Other persons present Patient;Spouse/SO   Patient Concerns Nutrition/Meal planning;Medication;Monitoring;Healthy Lifestyle;Weight Control;Glycemic Control;Problem Solving   Special Needs None   Preferred Learning Style Hands on   Learning Readiness Ready   What is the last grade level you completed in school? 12     Pre-Education Assessment   Patient understands the diabetes disease and treatment process. Needs Instruction   Patient understands incorporating nutritional management into lifestyle. Needs Instruction   Patient undertands incorporating physical activity into lifestyle. Needs Instruction   Patient understands using medications safely. Needs Instruction  pt reports having severe hypoglcyemic episode (BG 20's)after starting Glipizide 5 mg/day and MD then decreased dose to 2.5 mg daily (no further low BG's since decreasing dose)   Patient understands monitoring blood glucose, interpreting and using results Needs Instruction   Patient understands prevention, detection, and treatment of acute complications. Needs Instruction   Patient understands prevention, detection, and treatment of chronic complications. Needs Instruction   Patient understands how to develop strategies  to address psychosocial issues. Needs Instruction   Patient understands how to develop strategies to promote health/change behavior. Needs Instruction     Complications   Last HgB A1C per patient/outside source 7.6 %  04-2016   How often do you check your blood sugar? 1-2 times/day  fasting and bedtime   Fasting Blood glucose range (mg/dL) 70-129;130-179  checks BG fasting and bedtime; pt reports recent BG's 120's-150   Have you had a dilated eye exam in the past 12 months? No  >1 year ago   Have you had a dental exam in the past 12 months? No  >1 year ago   Are you checking your feet? No     Dietary Intake   Breakfast --  eats breakfast at 8a; PT HAS CROHN'S DISEASE   Snack (morning) --  eats occasional berries and cottage cheese at 10a, Glucerna   Lunch --  eats lunch at 12p-1:30p; eats fried foods 2-3x/wk (has decreased intake of sweets and carbohydrates)   Snack (afternoon) --  drinks occasional Glucerna   Dinner --  eats supper at 5:30-6p   Snack (evening) --  eats snack at 9p=Glucerna or berries and cottage cheese   Beverage(s) --  drinks water 8+ glasses/day and occasional artificially sweetened (Stevia) beverages 2-3/day; milk 4x/day     Exercise   Exercise Type Light (walking / raking leaves)   How many days per week to you exercise? 7   How many minutes per day do you exercise? --  10-15 MIN     Patient Education   Previous Diabetes Education --  had some education when diagnosed during recent hospitalization with pneumonia, bronchitits   Disease state  Definition of diabetes, type 1 and 2, and the diagnosis of diabetes;Explored patient's options for treatment of their diabetes;Factors that contribute to the development of diabetes   Nutrition management  Role of diet in the treatment of diabetes and the relationship between the three main macronutrients and blood glucose level;Food label reading, portion sizes and measuring food.;Carbohydrate counting   Physical  activity and exercise  Role of exercise on diabetes management, blood pressure control and cardiac health.;Helped patient identify appropriate exercises in relation to his/her diabetes, diabetes complications and other health issue.   Medications Reviewed patients medication for diabetes, action, purpose, timing of dose and side effects.   Monitoring Purpose and frequency of SMBG.;Taught/discussed recording of test results and interpretation of SMBG.;Identified appropriate SMBG and/or A1C goals.;Yearly dilated eye exam   Acute complications Taught treatment of hypoglycemia - the 15 rule.   Chronic complications Relationship between chronic complications and blood glucose control;Lipid levels, blood glucose control and heart disease;Dental care;Retinopathy and reason for yearly dilated eye exams;Reviewed with patient heart disease, higher risk of, and prevention   Personal strategies to promote health Lifestyle issues that need to be addressed for better diabetes care;Helped patient develop diabetes management plan      Individualized Plan for Diabetes Self-Management Training:   Learning Objective:  Patient will have a greater understanding of diabetes self-management. Patient education plan is to attend individual and/or group sessions per assessed needs and concerns.   Plan:   Patient Instructions   Check blood sugars 2 x day before breakfast and 2 hrs after supper every day and record  Exercise:  walk for    15-20  minutes   5  days a week  Avoid sugar sweetened drinks (soda, tea, coffee, sports drinks, juices)  Eat 3 meals day,   1  snacks a day at bedtime  Eat 2-3 carbohydrate servings/meal + protein  Eat 1 carbohydrate serving/snack + protein  Drink Glucerna as a snack if desired  Space meals 4-5 hours apart  Make a dentist / eye doctor appointment  Bring blood sugar records to the next appointment/class  Carry fast acting glucose and a snack at all times  Return for  appointment/classes on:  06-14-16   Expected Outcomes:   positive  Education material provided: General meal planning guidelines, low BG handout, 1 tube glucose tablets for PRN use  If problems or questions, patient to contact team via: [phone]  Future DSME appointment:  06-14-16

## 2016-05-29 NOTE — Patient Instructions (Signed)
  Check blood sugars 2 x day before breakfast and 2 hrs after supper every day and record  Exercise:  walk for    15-20  minutes   5  days a week  Avoid sugar sweetened drinks (soda, tea, coffee, sports drinks, juices)  Eat 3 meals day,   1  snacks a day at bedtime  Eat 2-3 carbohydrate servings/meal + protein  Eat 1 carbohydrate serving/snack + protein  Drink Glucerna as a snack if desired  Space meals 4-5 hours apart  Make a dentist / eye doctor appointment  Bring blood sugar records to the next appointment/class  Carry fast acting glucose and a snack at all times  Return for appointment/classes on:  06-14-16

## 2016-06-11 ENCOUNTER — Encounter: Payer: Self-pay | Admitting: Dietician

## 2016-06-11 NOTE — Progress Notes (Signed)
Pt cancelled diabetes classes. Wants to do MNT program that will by her covered by her  insurance at 100%-scheduled MNT appointment on 06-18-16

## 2016-06-14 ENCOUNTER — Ambulatory Visit: Payer: Medicare Other

## 2016-06-18 ENCOUNTER — Encounter: Payer: Self-pay | Admitting: Dietician

## 2016-06-18 ENCOUNTER — Encounter (INDEPENDENT_AMBULATORY_CARE_PROVIDER_SITE_OTHER): Payer: Federal, State, Local not specified - PPO | Admitting: Dietician

## 2016-06-18 VITALS — Ht 68.0 in | Wt 169.0 lb

## 2016-06-18 DIAGNOSIS — E119 Type 2 diabetes mellitus without complications: Secondary | ICD-10-CM | POA: Diagnosis not present

## 2016-06-18 NOTE — Patient Instructions (Addendum)
Balance meals with 1-3 oz protein, 2-3 servings (30-45 gms) of carbohydrate and non-starchy vegetables "free". Read labels for carbohydrate. Count 15 gms as a serving.  Include a least 6 oz of protein food daily. Can use Glucerna to supplement if not able to get enough protein through food choices. Continue to walk daily, gradually adding minutes.

## 2016-06-18 NOTE — Progress Notes (Signed)
Medical Nutrition Therapy: Visit start time:1330  end time: 1430 Assessment:  Diagnosis: Type 2 diabetes Past medical history: 20 year history of crohn's disease Psychosocial issues/ stress concerns: none identified  Preferred learning method:  . Hands-on  Current weight: 169 lbs Height: 68 in Medications, supplements: see list Progress and evaluation:  Patient in for initial medical nutrition therapy appointment. She was recently diagnosed with diabetes after being hospitalized for bronchitis/pneumonia. She continues to take steroid medication. She came for initial appointment for the diabetes program but it was determined that he insurance would only pay for an RD appointment.  She has been checking her blood sugars 2 x daily with FBG's and post meal readings in the 110's to 130's. She has made positive diet changes and has lost 5 lbs in the past 3 weeks. Her weight goal is 160 lbs. She is possibly over-restricting her carbohydrate and her protein intake may be lower than recommended. She loves vegetables and fruits and includes daily.   Physical activity: As she has regained strength from her hospitalization she has been walking for several minutes several times daily. Dietary Intake:  Usual eating pattern includes 62meals and 2-3 snacks per day. Dining out frequency: 1 meals per week.  Breakfast: 1:15BW- 1 slice low carb bread, 1 egg, 1 strip bacon, coffee with half/half Snack: small amount of peanuts Lunch: 62:03- 1 thin slice roast beef, 2 slices low carb bread, tomatoes, mayonnaise, or soup; sometimes adds 1/4 cup cottage cheese and 1/2 cup mixed berries Snack: 1/3 popsickle Supper: 5:30- meat, vegetables such as cabbage, green beans or brussels sprouts; sometimes adds a small white or sweet potato. Snack: 8:30- cheese, crackers, herbal tea (unsweetened) Beverages:water, coffee, milk, herbal tea Nutrition Care Education:  Diabetes:   Commended on positive diet changes made.  Instructed on a meal plan based on 1400 calories including carbohydrate counting and how to better balance carbohydrate, protein, healthy fats and non-starchy vegetables. Used food models and diabetes nutrition placemat to instruct as well as meal planning handout. Discussed role of exercise in weight management and glucose control.  Nutritional Diagnosis:  NI-5.11.1 Predicted suboptimal nutrient intake As related to possible low intake of protein and healthy carbohydrate choices .  As evidenced by diet history..  Intervention:  Balance meals with 1-3 oz protein, 2-3 servings (30-45 gms) of carbohydrate and non-starchy vegetables "free". Read labels for carbohydrate. Count 15 gms as a serving.  Include a least 6 oz of protein food daily. Can use Glucerna to supplement if not able to get enough protein through food choices. Continue to walk daily, gradually adding minutes.  Education Materials given:  . Plate Planner . Food lists/ Planning A Balanced Meal . Sample meal pattern/ menus . Goals/ instructions  Learner/ who was taught:  . Patient   Level of understanding: . Partial understanding; needs review/ practice Demonstrated degree of understanding via:   Teach back Learning barriers: . None Willingness to learn/ readiness for change: . Eager, change in progress  Monitoring and Evaluation:  Dietary intake, exercise, , and body weight      follow up: May 29th at 9:00am

## 2016-06-21 ENCOUNTER — Ambulatory Visit (INDEPENDENT_AMBULATORY_CARE_PROVIDER_SITE_OTHER): Payer: Federal, State, Local not specified - PPO | Admitting: General Surgery

## 2016-06-21 ENCOUNTER — Encounter: Payer: Self-pay | Admitting: General Surgery

## 2016-06-21 VITALS — BP 142/78 | HR 80 | Resp 14 | Ht 68.0 in | Wt 169.0 lb

## 2016-06-21 DIAGNOSIS — R1031 Right lower quadrant pain: Secondary | ICD-10-CM | POA: Diagnosis not present

## 2016-06-21 NOTE — Progress Notes (Signed)
Patient ID: Joanna Ortiz, female   DOB: 29-Feb-1945, 71 y.o.   MRN: [id number]  Chief Complaint  Patient presents with  . Follow-up    abd pain    HPI Joanna Ortiz is a 71 y.o. female.  Here today for follow up abdominal pain, which has resolved since starting the entrocort. She states her chronic diarrhea remains or some better. She does admit to having right groin "cramping" x 3 days since she is walking more. She has lost a little weight by reducing carbs and walking.The patient was not sure if she is injured herself by walking on uneven ground in a field or lifting her grandchild. She is moving her care from [facility] to [facility]. She plans on getting an IV infusion for the Crohns at [facility] by Dr Fransisca Kaufmann She is her with her husband, Joanna Ortiz.   HPI  Past Medical History:  Diagnosis Date  . Arthritis   . Collagen vascular disease ([place])   . Crohn disease ([place])   . Diabetes mellitus without complication ([place])   . Thyroid disease     Past Surgical History:  Procedure Laterality Date  . ABDOMINAL HYSTERECTOMY  1987  . APPENDECTOMY    . BREAST BIOPSY Bilateral    neg  . COLON SURGERY  06/06/2011   Resection of terminal ileum and previous ileocolonic anastomosis for recurrent Crohn's disease. 3 focal strictures identified.  . COLONOSCOPY  2013  . SMALL INTESTINE SURGERY  2012  . THYROID SURGERY  1992    Family History  Problem Relation Age of Onset  . Breast cancer Sister 4    Social History Social History  Substance Use Topics  . Smoking status: Former Smoker    Packs/day: 1.00    Years: 12.00    Types: Cigarettes  . Smokeless tobacco: Never Used  . Alcohol use No    Allergies  Allergen Reactions  . Erythromycin Hives  . Hydromorphone Hives  . Cefuroxime Nausea And Vomiting  . Ciprocinonide [Fluocinolone]     Muscle pain   . Codeine     Other reaction(s): Vomiting  . Floxin [Ofloxacin] Nausea And Vomiting  . Keflex [Cephalexin] Nausea And Vomiting  . Levofloxacin  Nausea And Vomiting  . Sulfa Antibiotics Nausea And Vomiting  . Ciprofloxacin Nausea And Vomiting    Current Outpatient Prescriptions  Medication Sig Dispense Refill  . acetaminophen (TYLENOL) 325 MG tablet Take 2 tablets (650 mg total) by mouth every 6 (six) hours as needed for mild pain (or Fever >/= 101).    Marland Kitchen albuterol (PROVENTIL HFA;VENTOLIN HFA) 108 (90 Base) MCG/ACT inhaler Inhale 2 puffs into the lungs every 6 (six) hours as needed for wheezing or shortness of breath. 1 Inhaler 2  . budesonide (ENTOCORT EC) 3 MG 24 hr capsule Take 3 capsules (9 mg total) by mouth daily. 30 capsule 0  . Calcium 500-125 MG-UNIT TABS Take 1 tablet by mouth daily.    . cholestyramine (QUESTRAN) 4 G packet Take 4 g by mouth 2 (two) times daily as needed (stomach problems).   12  . CINNAMON PO Take 1 capsule by mouth 2 (two) times daily.    . colestipol (COLESTID) 1 G tablet Take 1 g by mouth daily as needed (for diarrhea).     . cyanocobalamin (,VITAMIN B-12,) 1000 MCG/ML injection Inject 1,000 mcg into the muscle every 30 (thirty) days.    . feeding supplement, GLUCERNA SHAKE, (GLUCERNA SHAKE) LIQD Take 237 mLs by mouth 3 (three) times daily.    Marland Kitchen  glipiZIDE (GLUCOTROL) 5 MG tablet Take 2.5 mg by mouth daily before breakfast.    . hydrOXYzine (ATARAX/VISTARIL) 25 MG tablet Take 1 tablet by mouth every 8 (eight) hours as needed.    . magnesium oxide (MAG-OX) 400 MG tablet Take 400 mg by mouth 2 (two) times daily.    . Multiple Vitamin (MULTIVITAMIN) tablet Take 1 tablet by mouth daily.    . Omega-3 Fatty Acids (FISH OIL) 1000 MG CAPS Take 1,000 mg by mouth daily.     . ondansetron (ZOFRAN-ODT) 4 MG disintegrating tablet Take 4 mg by mouth as needed.    . potassium chloride (K-DUR,KLOR-CON) 10 MEQ tablet Take 1 tablet by mouth daily. For 7 days    . SYNTHROID 100 MCG tablet Take 100 mcg by mouth daily before breakfast.   11  . thyroid (ARMOUR) 30 MG tablet Take 30 mg by mouth every morning.    Marland Kitchen tiZANidine  (ZANAFLEX) 4 MG tablet Take 1 tablet by mouth as needed.    . traMADol (ULTRAM) 50 MG tablet Take 1 tablet (50 mg total) by mouth every 6 (six) hours as needed. 20 tablet 0  . TURMERIC CURCUMIN PO Take 1 capsule by mouth daily.     No current facility-administered medications for this visit.     Review of Systems Review of Systems  Constitutional: Negative.   Respiratory: Negative.   Cardiovascular: Negative.   Gastrointestinal: Positive for diarrhea.    Blood pressure (!) 142/78, pulse 80, resp. rate 14, height 5\' 8"  (1.727 m), weight 169 lb (76.7 kg).  Physical Exam Physical Exam  Constitutional: She is oriented to person, place, and time. She appears well-developed and well-nourished.  HENT:  Mouth/Throat: Oropharynx is clear and moist.  Eyes: Conjunctivae are normal. No scleral icterus.  Neck: Neck supple.  Cardiovascular:  Pulses:      Femoral pulses are 2+ on the right side, and 2+ on the left side.      Dorsalis pedis pulses are 2+ on the right side, and 2+ on the left side.       Posterior tibial pulses are 2+ on the right side, and 2+ on the left side.  Abdominal: Soft. Normal appearance. There is tenderness.  Musculoskeletal:       Legs: Lymphadenopathy:    She has no cervical adenopathy.  Neurological: She is alert and oriented to person, place, and time.  Skin: Skin is warm and dry.  Psychiatric: Her behavior is normal.    Data Reviewed April 2018 outpatient evaluation at [facility] reviewed.  Assessment    Clinically improved with oral tailored steroid therapy.    Plan    No clear evidence of inguinal pathology, likely musculoskeletal source for her pain. Observation alone is warranted. Local heat for comfort.     The patient is aware to use a heating pad as needed for comfort. Continue to walk with comfort as her guide.   HPI, Physical Exam, Assessment and Plan have been scribed under the direction and in the presence of [name],  MD.  Karie Fetch, RN  I have completed the exam and reviewed the above documentation for accuracy and completeness.  I agree with the above.  Haematologist has been used and any errors in dictation or transcription are unintentional.  Hervey Ard, M.D., F.A.C.S.   [name] 06/22/2016, 5:11 AM

## 2016-06-21 NOTE — Patient Instructions (Addendum)
The patient is aware to call back for any questions or concerns. The patient is aware to use a heating pad as needed for comfort. Continue to walk with comfort as her guide.

## 2016-06-22 DIAGNOSIS — R1031 Right lower quadrant pain: Secondary | ICD-10-CM | POA: Insufficient documentation

## 2016-06-25 ENCOUNTER — Ambulatory Visit: Payer: Medicare Other

## 2016-06-28 ENCOUNTER — Ambulatory Visit: Payer: Medicare Other

## 2016-07-17 ENCOUNTER — Encounter: Payer: Self-pay | Admitting: Dietician

## 2016-07-17 ENCOUNTER — Encounter: Payer: Federal, State, Local not specified - PPO | Attending: Family Medicine | Admitting: Dietician

## 2016-07-17 VITALS — Ht 68.0 in | Wt 165.9 lb

## 2016-07-17 DIAGNOSIS — E119 Type 2 diabetes mellitus without complications: Secondary | ICD-10-CM | POA: Insufficient documentation

## 2016-07-17 DIAGNOSIS — Z713 Dietary counseling and surveillance: Secondary | ICD-10-CM | POA: Insufficient documentation

## 2016-07-17 NOTE — Patient Instructions (Signed)
Drink at least 32 oz water daily in addition to milk, herbal tea and diet gingerale. Continue to balance meals with protein (1 oz at breakfast and 2-3 oz at lunch and dinner), at least 2 servings of carbohydrate at meals with a starch serving  as  one of the carbohydrate servings. Continue with walking for exercise with goal of at least 30 minutes total.

## 2016-07-17 NOTE — Progress Notes (Signed)
Medical Nutrition Therapy Follow-up visit:  Time: 9:10-10:10 Visit #:2 ASSESSMENT:  Diagnosis: Type 2 diabetes  Current weight:165.9    Height:68 in Medications: See list Medical History: 20 year history of crohn's disease Progress and evaluation: Patient in for medical nutrition follow-up appointment. Had recent surgery on right hand and hand and wrist is in a cast. She has followed through to include more carbohydrate with meals especially her evening meal.  She has also added more protein at lunch. She has lost 3.1 lbs in the past month. Most of her FBG's are 120's or less and most of her 2 hour post meal readings are 120's or less.  Physical activity:She continues to walk daily; walks outside if weather allows or walks in her house for 15 minutes 2-3 times per day when cannot walk outside.  NUTRITION CARE EDUCATION:  Diabetes:  Commended on her continued effort to make healthy food choices. Reviewed the meal plan instructed on at previous visit emphasizing need for adequate nutrients of protein, carbohydrate and healthy fats. Also, discussed prevention of diabetes related complications.  INTERVENTION:  Drink at least 32 oz water daily in addition to milk, herbal tea and diet gingerale. Continue to balance meals with protein (1 oz at breakfast and 2-3 oz at lunch and dinner), at least 2 servings of carbohydrate at meals with a starch serving  as  one of the carbohydrate servings. Continue with walking for exercise with goal of at least 30 minutes total EDUCATION MATERIALS GIVEN:  . Goals/ instructions  LEARNER/ who was taught:  . Patient   LEVEL OF UNDERSTANDING: . Verbalizes/ demonstrates competency Demonstrated degree of understanding via teach back.  LEARNING BARRIERS: . None WILLINGNESS TO LEARN/READINESS FOR CHANGE: . Eager, change in progress  MONITORING AND EVALUATION:  Diet, exercise, weight,                 Follow-up: 09/04/16 at 10:30am

## 2016-08-07 ENCOUNTER — Other Ambulatory Visit
Admission: RE | Admit: 2016-08-07 | Discharge: 2016-08-07 | Disposition: A | Discharge status: Home / Self Care | Payer: Federal, State, Local not specified - PPO | Source: Ambulatory Visit | Attending: Otolaryngology | Admitting: Otolaryngology

## 2016-08-07 DIAGNOSIS — E890 Postprocedural hypothyroidism: Secondary | ICD-10-CM | POA: Diagnosis present

## 2016-08-07 LAB — T4, FREE: Free T4: 1.15 ng/dL — ABNORMAL HIGH (ref 0.61–1.12)

## 2016-08-07 LAB — TSH: TSH: 0.554 u[IU]/mL (ref 0.350–4.500)

## 2016-08-08 LAB — T3, FREE: T3, Free: 2.6 pg/mL (ref 2.0–4.4)

## 2016-08-27 ENCOUNTER — Other Ambulatory Visit: Payer: Self-pay | Admitting: Family Medicine

## 2016-08-27 DIAGNOSIS — Z1231 Encounter for screening mammogram for malignant neoplasm of breast: Secondary | ICD-10-CM

## 2016-09-03 ENCOUNTER — Ambulatory Visit
Admission: RE | Admit: 2016-09-03 | Discharge: 2016-09-03 | Disposition: A | Discharge status: Home / Self Care | Payer: Federal, State, Local not specified - PPO | Source: Ambulatory Visit | Attending: Family Medicine | Admitting: Family Medicine

## 2016-09-03 DIAGNOSIS — Z1231 Encounter for screening mammogram for malignant neoplasm of breast: Secondary | ICD-10-CM | POA: Insufficient documentation

## 2016-09-04 ENCOUNTER — Ambulatory Visit: Payer: Medicare Other | Admitting: Dietician

## 2016-09-06 ENCOUNTER — Encounter: Payer: Self-pay | Admitting: Dietician

## 2016-09-14 DIAGNOSIS — M47812 Spondylosis without myelopathy or radiculopathy, cervical region: Secondary | ICD-10-CM | POA: Insufficient documentation

## 2016-09-20 ENCOUNTER — Encounter: Payer: Self-pay | Admitting: Emergency Medicine

## 2016-09-20 ENCOUNTER — Ambulatory Visit
Admission: EM | Admit: 2016-09-20 | Discharge: 2016-09-20 | Disposition: A | Discharge status: Home / Self Care | Payer: Federal, State, Local not specified - PPO | Attending: Family Medicine | Admitting: Family Medicine

## 2016-09-20 DIAGNOSIS — N390 Urinary tract infection, site not specified: Secondary | ICD-10-CM

## 2016-09-20 DIAGNOSIS — N3001 Acute cystitis with hematuria: Secondary | ICD-10-CM | POA: Diagnosis not present

## 2016-09-20 LAB — URINALYSIS, COMPLETE (UACMP) WITH MICROSCOPIC
BILIRUBIN URINE: NEGATIVE
GLUCOSE, UA: NEGATIVE mg/dL
Nitrite: NEGATIVE
PH: 5.5 (ref 5.0–8.0)
Protein, ur: 100 mg/dL — AB
SQUAMOUS EPITHELIAL / LPF: NONE SEEN
Specific Gravity, Urine: 1.025 (ref 1.005–1.030)

## 2016-09-20 MED ORDER — NITROFURANTOIN MONOHYD MACRO 100 MG PO CAPS: 100.0000 mg | ORAL_CAPSULE | Freq: Two times a day (BID) | ORAL | 0 refills | Status: DC

## 2016-09-20 MED ORDER — PHENAZOPYRIDINE HCL 200 MG PO TABS: 200.0000 mg | ORAL_TABLET | Freq: Three times a day (TID) | ORAL | 0 refills | Status: DC

## 2016-09-20 NOTE — ED Triage Notes (Signed)
Patient c/o burning when urinating and lower back pain that started yesterday.

## 2016-09-20 NOTE — ED Provider Notes (Signed)
[facility]    CSN: [id number] Arrival date & time: 09/20/16  1312     History   Chief Complaint Chief Complaint  Patient presents with  . Dysuria  . Back Pain    HPI Joanna Ortiz is a 71 y.o. female.   Patient is 71 year old white female who's had a history of recurrent UTIs. She has become septic from the UTIs before in the past apparently she's also had pylolynephritis before. She states that after being admitted she hasn't left the UTIs last long before she seeks help. All her symptoms started yesterday with frequency and burning at the end of urination she came in to be seen and evaluated. Normally she sees a PCP but her PCP and see her today so she came in to be seen by [place]. She does have a history of Crohn's disease and she states that sometimes she thinks is a problem with her Crohn's disease that the GI problems she has colds since made predispose her to get recurrent UTIs. She has multiple allergies but no antibiotic allergies I can see. She does not smoke. Besides Crohn's disease she has diabetes, connective tissue disease arthritis and thyroid disease. She's had abdominal hysterectomy appendectomy breast biopsy small intestine and colon surgeries in the past as well. As well as numerous colonoscopies. No pertinent family medical history relevant to today's visit   The history is provided by the patient. No language interpreter was used.  Dysuria  Pain quality:  Aching and burning Pain severity:  Moderate Onset quality:  Sudden Timing:  Constant Progression:  Worsening Chronicity:  New Recent urinary tract infections: no   Relieved by:  Nothing Worsened by:  Nothing Urinary symptoms: frequent urination   Associated symptoms: abdominal pain   Risk factors: hx of pyelonephritis and recurrent urinary tract infections   Back Pain  Associated symptoms: abdominal pain and dysuria     Past Medical History:  Diagnosis Date  . Arthritis   . Collagen  vascular disease ([place])   . Crohn disease ([place])   . Diabetes mellitus without complication ([place])   . Thyroid disease     Patient Active Problem List   Diagnosis Date Noted  . Right groin pain 06/22/2016  . Acute bronchitis 05/01/2016  . Diastasis recti 10/29/2015  . Rectal bleeding 06/10/2014  . AP (abdominal pain) 12/29/2013  . Crohn's disease ([place]) 12/18/2013    Past Surgical History:  Procedure Laterality Date  . ABDOMINAL HYSTERECTOMY  1987  . APPENDECTOMY    . BREAST BIOPSY Bilateral    neg  . COLON SURGERY  06/06/2011   Resection of terminal ileum and previous ileocolonic anastomosis for recurrent Crohn's disease. 3 focal strictures identified.  . COLONOSCOPY  2013  . SMALL INTESTINE SURGERY  2012  . THYROID SURGERY  1992    OB History    Gravida Para Term Preterm AB Living   6 4     2 4    SAB TAB Ectopic Multiple Live Births   2              Obstetric Comments   1st Menstrual Cycle:  14  1st Pregnancy: 19       Home Medications    Prior to Admission medications   Medication Sig Start Date End Date Taking? Authorizing Provider  acetaminophen (TYLENOL) 325 MG tablet Take 2 tablets (650 mg total) by mouth every 6 (six) hours as needed for mild pain (or Fever >/= 101). 05/04/16   Gouru,  Illene Silver, MD  albuterol (PROVENTIL HFA;VENTOLIN HFA) 108 (90 Base) MCG/ACT inhaler Inhale 2 puffs into the lungs every 6 (six) hours as needed for wheezing or shortness of breath. Patient not taking: Reported on 07/17/2016 05/04/16   Nicholes Mango, MD  Ascorbic Acid (VITAMIN C) 1000 MG tablet Take 1,000 mg by mouth daily.    [provider]  budesonide (ENTOCORT EC) 3 MG 24 hr capsule Take 3 capsules (9 mg total) by mouth daily. 05/22/16   Nicholes Mango, MD  Calcium 500-125 MG-UNIT TABS Take 1 tablet by mouth daily.    [provider]  cholestyramine Lucrezia Starch) 4 G packet Take 4 g by mouth 2 (two) times daily as needed (stomach problems).     [provider]    CINNAMON PO Take 1 capsule by mouth 2 (two) times daily.    [provider]  colestipol (COLESTID) 1 G tablet Take 1 g by mouth daily as needed (for diarrhea).     [provider]  cyanocobalamin (,VITAMIN B-12,) 1000 MCG/ML injection Inject 1,000 mcg into the muscle every 30 (thirty) days.    [provider]  feeding supplement, GLUCERNA SHAKE, (GLUCERNA SHAKE) LIQD Take 237 mLs by mouth 3 (three) times daily. 05/21/16   [provider]  glipiZIDE (GLUCOTROL) 5 MG tablet Take 2.5 mg by mouth daily before breakfast.    [provider]  hydrOXYzine (ATARAX/VISTARIL) 25 MG tablet Take 1 tablet by mouth every 8 (eight) hours as needed. 04/15/15   [provider]  magnesium oxide (MAG-OX) 400 MG tablet Take 400 mg by mouth 2 (two) times daily. 10/13/15 10/12/16  [provider]  Multiple Vitamin (MULTIVITAMIN) tablet Take 1 tablet by mouth daily.    [provider]  nitrofurantoin, macrocrystal-monohydrate, (MACROBID) 100 MG capsule Take 1 capsule (100 mg total) by mouth 2 (two) times daily. 09/20/16   Frederich Cha, MD  Omega-3 Fatty Acids (FISH OIL) 1000 MG CAPS Take 1,000 mg by mouth daily.     [provider]  ondansetron (ZOFRAN-ODT) 4 MG disintegrating tablet Take 4 mg by mouth as needed. 11/16/15   [provider]  phenazopyridine (PYRIDIUM) 200 MG tablet Take 1 tablet (200 mg total) by mouth 3 (three) times daily. 09/20/16   Frederich Cha, MD  potassium chloride (K-DUR,KLOR-CON) 10 MEQ tablet Take 1 tablet by mouth daily. For 7 days 05/17/16   [provider]  SYNTHROID 100 MCG tablet Take 100 mcg by mouth daily before breakfast.  04/27/14   [provider]  thyroid (ARMOUR) 30 MG tablet Take 30 mg by mouth every morning.    [provider]  tiZANidine (ZANAFLEX) 4 MG tablet Take 1 tablet by mouth as needed. 03/19/16   [provider]  traMADol (ULTRAM) 50 MG tablet Take 1 tablet (50 mg  total) by mouth every 6 (six) hours as needed. Patient not taking: Reported on 07/17/2016 03/12/16   Daymon Larsen, MD  TURMERIC CURCUMIN PO Take 1 capsule by mouth daily.    [provider]  vedolizumab (ENTYVIO) 300 MG injection Inject into the vein.    [provider]    Family History Family History  Problem Relation Age of Onset  . Breast cancer Sister 26    Social History Social History  Substance Use Topics  . Smoking status: Former Smoker    Packs/day: 1.00    Years: 12.00    Types: Cigarettes  . Smokeless tobacco: Never Used  . Alcohol use No  Allergies   Erythromycin; Hydromorphone; Cefuroxime; Ciprocinonide [fluocinolone]; Codeine; Floxin [ofloxacin]; Keflex [cephalexin]; Levofloxacin; Sulfa antibiotics; and Ciprofloxacin   Review of Systems Review of Systems  Gastrointestinal: Positive for abdominal pain.  Genitourinary: Positive for dysuria.  Musculoskeletal: Positive for back pain.  All other systems reviewed and are negative.    Physical Exam Triage Vital Signs ED Triage Vitals [09/20/16 1333]  Enc Vitals Group     BP 140/70     Pulse Rate 78     Resp 16     Temp 98.1 F (36.7 C)     Temp Source Oral     SpO2 100 %     Weight 157 lb (71.2 kg)     Height 5\' 8"  (1.727 m)     Head Circumference      Peak Flow      Pain Score 4     Pain Loc      Pain Edu?      Excl. in [place]?    No data found.   Updated Vital Signs BP 140/70 (BP Location: Left Arm)   Pulse 78   Temp 98.1 F (36.7 C) (Oral)   Resp 16   Ht 5\' 8"  (1.727 m)   Wt 157 lb (71.2 kg)   SpO2 100%   BMI 23.87 kg/m   Visual Acuity Right Eye Distance:   Left Eye Distance:   Bilateral Distance:    Right Eye Near:   Left Eye Near:    Bilateral Near:     Physical Exam  Constitutional: She is oriented to person, place, and time. She appears well-developed and well-nourished. No distress.  HENT:  Head: Normocephalic.  Right Ear: External ear normal.    Mouth/Throat: Oropharynx is clear and moist.  Eyes: Pupils are equal, round, and reactive to light.  Neck: Normal range of motion. Neck supple.  Pulmonary/Chest: Effort normal.  Abdominal: Soft.  Musculoskeletal: Normal range of motion.  Apparently she has had recently orthopedic surgery and has [place] this type of sling splint on her right hand as well  Neurological: She is alert and oriented to person, place, and time.  Skin: Skin is warm. She is not diaphoretic.  Psychiatric: She has a normal mood and affect.  Vitals reviewed.    UC Treatments / Results  Labs (all labs ordered are listed, but only abnormal results are displayed) Labs Reviewed  URINALYSIS, COMPLETE (UACMP) WITH MICROSCOPIC - Abnormal; Notable for the following:       Result Value   Color, Urine AMBER (*)    APPearance CLOUDY (*)    Hgb urine dipstick MODERATE (*)    Ketones, ur TRACE (*)    Protein, ur 100 (*)    Leukocytes, UA LARGE (*)    Non Squamous Epithelial PRESENT (*)    Bacteria, UA FEW (*)    All other components within normal limits  URINE CULTURE    EKG  EKG Interpretation None       Radiology No results found.  Procedures Procedures (including critical care time)  Medications Ordered in UC Medications - No data to display  Results for orders placed or performed during the hospital encounter of 09/20/16  Urinalysis, Complete w Microscopic  Result Value Ref Range   Color, Urine AMBER (A) YELLOW   APPearance CLOUDY (A) CLEAR   Specific Gravity, Urine 1.025 1.005 - 1.030   pH 5.5 5.0 - 8.0   Glucose, UA NEGATIVE NEGATIVE mg/dL   Hgb urine dipstick MODERATE (A) NEGATIVE  Bilirubin Urine NEGATIVE NEGATIVE   Ketones, ur TRACE (A) NEGATIVE mg/dL   Protein, ur 100 (A) NEGATIVE mg/dL   Nitrite NEGATIVE NEGATIVE   Leukocytes, UA LARGE (A) NEGATIVE   Squamous Epithelial / LPF NONE SEEN NONE SEEN   Non Squamous Epithelial PRESENT (A) NONE SEEN   WBC, UA TOO NUMEROUS TO COUNT 0 - 5  WBC/hpf   RBC / HPF 6-30 0 - 5 RBC/hpf   Bacteria, UA FEW (A) NONE SEEN   Initial Impression / Assessment and Plan / UC Course  I have reviewed the triage vital signs and the nursing notes.  Pertinent labs & imaging results that were available during my care of the patient were reviewed by me and considered in my medical decision making (see chart for details).   urine is grossly abnormal and with blood. She denies having a history of hematuria before that she's aware of will treat with Macrobid since her pH is 5.54 week Pyridium for 5 days she declined yeast medication such as Diflucan at this time follow-up with PCP in about 2 weeks.    Final Clinical Impressions(s) / UC Diagnoses   Final diagnoses:  Lower urinary tract infectious disease  Acute cystitis with hematuria    New Prescriptions Discharge Medication List as of 09/20/2016  2:28 PM    START taking these medications   Details  nitrofurantoin, macrocrystal-monohydrate, (MACROBID) 100 MG capsule Take 1 capsule (100 mg total) by mouth 2 (two) times daily., Starting Thu 09/20/2016, Normal    phenazopyridine (PYRIDIUM) 200 MG tablet Take 1 tablet (200 mg total) by mouth 3 (three) times daily., Starting Thu 09/20/2016, Normal         Frederich Cha, MD 09/20/16 1452

## 2016-09-23 LAB — URINE CULTURE

## 2016-10-30 ENCOUNTER — Encounter: Payer: Self-pay | Admitting: *Deleted

## 2016-11-06 ENCOUNTER — Ambulatory Visit: Payer: Federal, State, Local not specified - PPO | Admitting: Certified Registered Nurse Anesthetist

## 2016-11-06 ENCOUNTER — Encounter
Admission: RE | Disposition: A | Discharge status: Home / Self Care | Payer: Self-pay | Source: Ambulatory Visit | Attending: Ophthalmology

## 2016-11-06 ENCOUNTER — Ambulatory Visit
Admission: RE | Admit: 2016-11-06 | Discharge: 2016-11-06 | Disposition: A | Discharge status: Home / Self Care | Payer: Federal, State, Local not specified - PPO | Source: Ambulatory Visit | Attending: Ophthalmology | Admitting: Ophthalmology

## 2016-11-06 DIAGNOSIS — K5090 Crohn's disease, unspecified, without complications: Secondary | ICD-10-CM | POA: Diagnosis not present

## 2016-11-06 DIAGNOSIS — E119 Type 2 diabetes mellitus without complications: Secondary | ICD-10-CM | POA: Diagnosis not present

## 2016-11-06 DIAGNOSIS — Z79899 Other long term (current) drug therapy: Secondary | ICD-10-CM | POA: Insufficient documentation

## 2016-11-06 DIAGNOSIS — Z882 Allergy status to sulfonamides status: Secondary | ICD-10-CM | POA: Insufficient documentation

## 2016-11-06 DIAGNOSIS — E0500 Thyrotoxicosis with diffuse goiter without thyrotoxic crisis or storm: Secondary | ICD-10-CM | POA: Insufficient documentation

## 2016-11-06 DIAGNOSIS — Z87891 Personal history of nicotine dependence: Secondary | ICD-10-CM | POA: Insufficient documentation

## 2016-11-06 DIAGNOSIS — H2511 Age-related nuclear cataract, right eye: Secondary | ICD-10-CM | POA: Insufficient documentation

## 2016-11-06 HISTORY — DX: Pneumonia, unspecified organism: J18.9

## 2016-11-06 HISTORY — DX: Anemia, unspecified: D64.9

## 2016-11-06 HISTORY — DX: Hypothyroidism, unspecified: E03.9

## 2016-11-06 HISTORY — PX: CATARACT EXTRACTION W/PHACO: SHX586

## 2016-11-06 LAB — GLUCOSE, CAPILLARY: GLUCOSE-CAPILLARY: 116 mg/dL — AB (ref 65–99)

## 2016-11-06 SURGERY — PHACOEMULSIFICATION, CATARACT, WITH IOL INSERTION
Anesthesia: Monitor Anesthesia Care | Site: Eye | Laterality: Right | Wound class: Clean

## 2016-11-06 MED ORDER — ARMC OPHTHALMIC DILATING DROPS
OPHTHALMIC | Status: AC
  Administered 2016-11-06: 1 via OPHTHALMIC
  Filled 2016-11-06: qty 0.4

## 2016-11-06 MED ORDER — SODIUM CHLORIDE 0.9 % IV SOLN
INTRAVENOUS | Status: DC
  Administered 2016-11-06: 10:00:00 via INTRAVENOUS

## 2016-11-06 MED ORDER — MOXIFLOXACIN HCL 0.5 % OP SOLN
OPHTHALMIC | Status: AC
  Filled 2016-11-06: qty 3

## 2016-11-06 MED ORDER — FENTANYL CITRATE (PF) 100 MCG/2ML IJ SOLN
INTRAMUSCULAR | Status: AC
  Filled 2016-11-06: qty 2

## 2016-11-06 MED ORDER — EPINEPHRINE PF 1 MG/ML IJ SOLN
INTRAMUSCULAR | Status: AC
  Filled 2016-11-06: qty 2

## 2016-11-06 MED ORDER — MIDAZOLAM HCL 2 MG/2ML IJ SOLN
INTRAMUSCULAR | Status: DC | PRN
  Administered 2016-11-06: 1 mg via INTRAVENOUS

## 2016-11-06 MED ORDER — LIDOCAINE HCL (PF) 4 % IJ SOLN
INTRAMUSCULAR | Status: AC
  Filled 2016-11-06: qty 5

## 2016-11-06 MED ORDER — POVIDONE-IODINE 5 % OP SOLN
OPHTHALMIC | Status: AC
  Filled 2016-11-06: qty 30

## 2016-11-06 MED ORDER — MIDAZOLAM HCL 2 MG/2ML IJ SOLN
INTRAMUSCULAR | Status: AC
  Filled 2016-11-06: qty 2

## 2016-11-06 MED ORDER — LIDOCAINE HCL (PF) 4 % IJ SOLN
INTRAOCULAR | Status: DC | PRN
  Administered 2016-11-06: 4 mL via OPHTHALMIC

## 2016-11-06 MED ORDER — POLYMYXIN B-TRIMETHOPRIM 10000-0.1 UNIT/ML-% OP SOLN
OPHTHALMIC | Status: AC
  Filled 2016-11-06: qty 10

## 2016-11-06 MED ORDER — NA CHONDROIT SULF-NA HYALURON 40-17 MG/ML IO SOLN
INTRAOCULAR | Status: DC | PRN
  Administered 2016-11-06: 1 mL via INTRAOCULAR

## 2016-11-06 MED ORDER — ARMC OPHTHALMIC DILATING DROPS
1.0000 "application " | OPHTHALMIC | Status: AC
  Administered 2016-11-06 (×3): 1 via OPHTHALMIC

## 2016-11-06 MED ORDER — POVIDONE-IODINE 5 % OP SOLN
OPHTHALMIC | Status: DC | PRN
Start: 2016-11-06 — End: 2016-11-06
  Administered 2016-11-06: 1 via OPHTHALMIC

## 2016-11-06 MED ORDER — POLYMYXIN B-TRIMETHOPRIM 10000-0.1 UNIT/ML-% OP SOLN
OPHTHALMIC | Status: DC | PRN
  Administered 2016-11-06: 1 [drp]

## 2016-11-06 MED ORDER — NA CHONDROIT SULF-NA HYALURON 40-17 MG/ML IO SOLN
INTRAOCULAR | Status: AC
  Filled 2016-11-06: qty 1

## 2016-11-06 MED ORDER — BSS IO SOLN
INTRAOCULAR | Status: DC | PRN
  Administered 2016-11-06: 11:00:00 via OPHTHALMIC

## 2016-11-06 MED ORDER — FENTANYL CITRATE (PF) 100 MCG/2ML IJ SOLN
INTRAMUSCULAR | Status: DC | PRN
  Administered 2016-11-06: 50 ug via INTRAVENOUS

## 2016-11-06 MED ORDER — POLYMYXIN B-TRIMETHOPRIM 10000-0.1 UNIT/ML-% OP SOLN: 1.0000 [drp] | OPHTHALMIC | Status: DC | PRN

## 2016-11-06 MED ORDER — CARBACHOL 0.01 % IO SOLN
INTRAOCULAR | Status: DC | PRN
  Administered 2016-11-06: 0.5 mL via INTRAOCULAR

## 2016-11-06 SURGICAL SUPPLY — 16 items
GLOVE BIO SURGEON STRL SZ8 (GLOVE) ×3 IMPLANT
GLOVE BIOGEL M 6.5 STRL (GLOVE) ×3 IMPLANT
GLOVE SURG LX 8.0 MICRO (GLOVE) ×2
GLOVE SURG LX STRL 8.0 MICRO (GLOVE) ×1 IMPLANT
GOWN STRL REUS W/ TWL LRG LVL3 (GOWN DISPOSABLE) ×2 IMPLANT
GOWN STRL REUS W/TWL LRG LVL3 (GOWN DISPOSABLE) ×4
LABEL CATARACT MEDS ST (LABEL) ×3 IMPLANT
LENS IOL TECNIS ITEC 25.5 (Intraocular Lens) ×3 IMPLANT
PACK CATARACT (MISCELLANEOUS) ×3 IMPLANT
PACK CATARACT BRASINGTON LX (MISCELLANEOUS) ×3 IMPLANT
PACK EYE AFTER SURG (MISCELLANEOUS) ×3 IMPLANT
SOL BSS BAG (MISCELLANEOUS) ×3
SOLUTION BSS BAG (MISCELLANEOUS) ×1 IMPLANT
SYR 5ML LL (SYRINGE) ×3 IMPLANT
WATER STERILE IRR 250ML POUR (IV SOLUTION) ×3 IMPLANT
WIPE NON LINTING 3.25X3.25 (MISCELLANEOUS) ×3 IMPLANT

## 2016-11-06 NOTE — Anesthesia Post-op Follow-up Note (Signed)
Anesthesia QCDR form completed.        

## 2016-11-06 NOTE — Anesthesia Postprocedure Evaluation (Signed)
Anesthesia Post Note  Patient: Joanna Ortiz  Procedure(s) Performed: Procedure(s) (LRB): CATARACT EXTRACTION PHACO AND INTRAOCULAR LENS PLACEMENT (IOC) (Right)  Patient location during evaluation: PACU Anesthesia Type: MAC Level of consciousness: awake and alert Pain management: pain level controlled Vital Signs Assessment: post-procedure vital signs reviewed and stable Respiratory status: spontaneous breathing, nonlabored ventilation, respiratory function stable and patient connected to nasal cannula oxygen Cardiovascular status: stable and blood pressure returned to baseline Postop Assessment: no apparent nausea or vomiting Anesthetic complications: no     Last Vitals:  Vitals:   11/06/16 1127 11/06/16 1128  BP: (!) 160/60 (!) 160/60  Pulse: 78 77  Resp: 18 16  Temp: 36.8 C   SpO2: 100% 100%    Last Pain:  Vitals:   11/06/16 1128  TempSrc: Oral  PainSc:                  [name]

## 2016-11-06 NOTE — Transfer of Care (Signed)
Immediate Anesthesia Transfer of Care Note  Patient: Joanna Ortiz  Procedure(s) Performed: Procedure(s) with comments: CATARACT EXTRACTION PHACO AND INTRAOCULAR LENS PLACEMENT (IOC) (Right) - [place] 00:42.6 AP% 16.7 CDE 7.14 Fluid Pack Lot # [id number]  Patient Location: PACU  Anesthesia Type:MAC  Level of Consciousness: awake, alert  and oriented  Airway & Oxygen Therapy: Patient Spontanous Breathing  Post-op Assessment: Report given to RN and Post -op Vital signs reviewed and stable  Post vital signs: Reviewed and stable  Last Vitals:  Vitals:   11/06/16 1010 11/06/16 1128  BP: (!) 164/75 (!) 160/60  Pulse: 80 77  Resp: 18 16  Temp: 36.7 C   SpO2: 100% 100%    Last Pain:  Vitals:   11/06/16 1128  TempSrc: Oral  PainSc:       Patients Stated Pain Goal: 2 (18/59/09 3112)  Complications: No apparent anesthesia complications

## 2016-11-06 NOTE — Discharge Instructions (Signed)
Follow Dr. Inda Coke postop discharge instruction sheet as reviewed.  Eye Surgery Discharge Instructions  Expect mild scratchy sensation or mild soreness. DO NOT RUB YOUR EYE!  The day of surgery:  Minimal physical activity, but bed rest is not required  No reading, computer work, or close hand work  No bending, lifting, or straining.  May watch TV  For 24 hours:  No driving, legal decisions, or alcoholic beverages  Safety precautions  Eat anything you prefer: It is better to start with liquids, then soup then solid foods.  _____ Eye patch should be worn until postoperative exam tomorrow.  ____ Solar shield eyeglasses should be worn for comfort in the sunlight/patch while sleeping  Resume all regular medications including aspirin or Coumadin if these were discontinued prior to surgery. You may shower, bathe, shave, or wash your hair. Tylenol may be taken for mild discomfort.  Call your doctor if you experience significant pain, nausea, or vomiting, fever > 101 or other signs of infection. [phone] or [phone] Specific instructions:  Follow-up Information    Birder Robson, MD Follow up.   Specialty:  Ophthalmology Why:  TODAY 11-06-16- @ 2:35 PM Contact information: [address] [place] [address] [phone]

## 2016-11-06 NOTE — Anesthesia Procedure Notes (Signed)
Procedure Name: MAC Date/Time: 11/06/2016 11:06 AM Performed by: [name] Pre-anesthesia Checklist: Patient identified, Emergency Drugs available, Suction available, Patient being monitored and Timeout performed Oxygen Delivery Method: Nasal cannula Placement Confirmation: positive ETCO2

## 2016-11-06 NOTE — Op Note (Signed)
PREOPERATIVE DIAGNOSIS:  Nuclear sclerotic cataract of the right eye.   POSTOPERATIVE DIAGNOSIS:  NUCLEAR SCLEROTIC CATARACT RIGHT EYE   OPERATIVE PROCEDURE: Procedure(s): CATARACT EXTRACTION PHACO AND INTRAOCULAR LENS PLACEMENT (IOC)   SURGEON:  Birder Robson, MD.   ANESTHESIA:  Anesthesiologist: Molli Barrows, MD CRNA: [name], CRNA  1.      Managed anesthesia care. 2.      0.60ml of Shugarcaine was instilled in the eye following the paracentesis.   COMPLICATIONS:  None.   TECHNIQUE:   Stop and chop   DESCRIPTION OF PROCEDURE:  The patient was examined and consented in the preoperative holding area where the aforementioned topical anesthesia was applied to the right eye and then brought back to the Operating Room where the right eye was prepped and draped in the usual sterile ophthalmic fashion and a lid speculum was placed. A paracentesis was created with the side port blade and the anterior chamber was filled with viscoelastic. A near clear corneal incision was performed with the steel keratome. A continuous curvilinear capsulorrhexis was performed with a cystotome followed by the capsulorrhexis forceps. Hydrodissection and hydrodelineation were carried out with BSS on a blunt cannula. The lens was removed in a stop and chop  technique and the remaining cortical material was removed with the irrigation-aspiration handpiece. The capsular bag was inflated with viscoelastic and the Technis [id number]  lens was placed in the capsular bag without complication. The remaining viscoelastic was removed from the eye with the irrigation-aspiration handpiece. The wounds were hydrated. The anterior chamber was flushed with Miostat and the eye was inflated to physiologic pressure. 0.62ml of Vigamox was placed in the anterior chamber. The wounds were found to be water tight. The eye was dressed with Vigamox. The patient was given protective glasses to wear throughout the day and a shield with which to  sleep tonight. The patient was also given drops with which to begin a drop regimen today and will follow-up with me in one day.  Implant Name Type Inv. Item Serial No. Manufacturer Lot No. LRB No. Used  LENS IOL DIOP 25.5 - [id number] Intraocular Lens LENS IOL DIOP 25.5 [id number] AMO   Right 1   Procedure(s) with comments: CATARACT EXTRACTION PHACO AND INTRAOCULAR LENS PLACEMENT (IOC) (Right) - [place] 00:42.6 AP% 16.7 CDE 7.14 Fluid Pack Lot # [id number]  Electronically signed: [place] 11/06/2016 11:27 AM

## 2016-11-06 NOTE — Anesthesia Preprocedure Evaluation (Signed)
Anesthesia Evaluation  Patient identified by MRN, date of birth, ID band Patient awake    Reviewed: Allergy & Precautions, H&P , NPO status , Patient's Chart, lab work & pertinent test results, reviewed documented beta blocker date and time   Airway Mallampati: II  TM Distance: >3 FB Neck ROM: full    Dental no notable dental hx. (+) Teeth Intact   Pulmonary neg pulmonary ROS, former smoker,    Pulmonary exam normal breath sounds clear to auscultation       Cardiovascular Exercise Tolerance: Good hypertension, On Medications negative cardio ROS   Rhythm:regular Rate:Normal     Neuro/Psych  Neuromuscular disease negative neurological ROS  negative psych ROS   GI/Hepatic negative GI ROS, Neg liver ROS,   Endo/Other  negative endocrine ROSdiabetes, Well Controlled, Type 2Hypothyroidism   Renal/GU      Musculoskeletal   Abdominal   Peds  Hematology negative hematology ROS (+) anemia ,   Anesthesia Other Findings   Reproductive/Obstetrics negative OB ROS                             Anesthesia Physical Anesthesia Plan  ASA: III  Anesthesia Plan: MAC   Post-op Pain Management:    Induction:   PONV Risk Score and Plan:   Airway Management Planned:   Additional Equipment:   Intra-op Plan:   Post-operative Plan:   Informed Consent: I have reviewed the patients History and Physical, chart, labs and discussed the procedure including the risks, benefits and alternatives for the proposed anesthesia with the patient or authorized representative who has indicated his/her understanding and acceptance.     Plan Discussed with: CRNA  Anesthesia Plan Comments:         Anesthesia Quick Evaluation

## 2016-11-06 NOTE — H&P (Signed)
All labs reviewed. Abnormal studies sent to patients PCP when indicated.  Previous H&P reviewed, patient examined, there are NO CHANGES.  Joanna Bejar LOUIS9/18/201811:01 AM

## 2016-12-25 ENCOUNTER — Ambulatory Visit: Payer: Federal, State, Local not specified - PPO | Admitting: Anesthesiology

## 2016-12-25 ENCOUNTER — Encounter
Admission: RE | Disposition: A | Discharge status: Home / Self Care | Payer: Self-pay | Source: Ambulatory Visit | Attending: Ophthalmology

## 2016-12-25 ENCOUNTER — Ambulatory Visit
Admission: RE | Admit: 2016-12-25 | Discharge: 2016-12-25 | Disposition: A | Discharge status: Home / Self Care | Payer: Federal, State, Local not specified - PPO | Source: Ambulatory Visit | Attending: Ophthalmology | Admitting: Ophthalmology

## 2016-12-25 ENCOUNTER — Encounter: Payer: Self-pay | Admitting: Emergency Medicine

## 2016-12-25 DIAGNOSIS — Z87891 Personal history of nicotine dependence: Secondary | ICD-10-CM | POA: Diagnosis not present

## 2016-12-25 DIAGNOSIS — Z885 Allergy status to narcotic agent status: Secondary | ICD-10-CM | POA: Insufficient documentation

## 2016-12-25 DIAGNOSIS — K5090 Crohn's disease, unspecified, without complications: Secondary | ICD-10-CM | POA: Diagnosis not present

## 2016-12-25 DIAGNOSIS — Z9049 Acquired absence of other specified parts of digestive tract: Secondary | ICD-10-CM | POA: Insufficient documentation

## 2016-12-25 DIAGNOSIS — Z79899 Other long term (current) drug therapy: Secondary | ICD-10-CM | POA: Insufficient documentation

## 2016-12-25 DIAGNOSIS — M1990 Unspecified osteoarthritis, unspecified site: Secondary | ICD-10-CM | POA: Diagnosis not present

## 2016-12-25 DIAGNOSIS — Z888 Allergy status to other drugs, medicaments and biological substances status: Secondary | ICD-10-CM | POA: Diagnosis not present

## 2016-12-25 DIAGNOSIS — Z882 Allergy status to sulfonamides status: Secondary | ICD-10-CM | POA: Diagnosis not present

## 2016-12-25 DIAGNOSIS — Z881 Allergy status to other antibiotic agents status: Secondary | ICD-10-CM | POA: Insufficient documentation

## 2016-12-25 DIAGNOSIS — M359 Systemic involvement of connective tissue, unspecified: Secondary | ICD-10-CM | POA: Insufficient documentation

## 2016-12-25 DIAGNOSIS — E039 Hypothyroidism, unspecified: Secondary | ICD-10-CM | POA: Insufficient documentation

## 2016-12-25 DIAGNOSIS — E119 Type 2 diabetes mellitus without complications: Secondary | ICD-10-CM | POA: Diagnosis not present

## 2016-12-25 DIAGNOSIS — H2512 Age-related nuclear cataract, left eye: Secondary | ICD-10-CM | POA: Diagnosis not present

## 2016-12-25 HISTORY — PX: CATARACT EXTRACTION W/PHACO: SHX586

## 2016-12-25 LAB — GLUCOSE, CAPILLARY: GLUCOSE-CAPILLARY: 137 mg/dL — AB (ref 65–99)

## 2016-12-25 SURGERY — PHACOEMULSIFICATION, CATARACT, WITH IOL INSERTION
Anesthesia: Monitor Anesthesia Care | Site: Eye | Laterality: Left | Wound class: Clean

## 2016-12-25 MED ORDER — ONDANSETRON HCL 4 MG/2ML IJ SOLN
INTRAMUSCULAR | Status: DC | PRN
  Administered 2016-12-25: 4 mg via INTRAVENOUS

## 2016-12-25 MED ORDER — LIDOCAINE HCL (PF) 4 % IJ SOLN
INTRAMUSCULAR | Status: AC
  Filled 2016-12-25: qty 5

## 2016-12-25 MED ORDER — CARBACHOL 0.01 % IO SOLN
INTRAOCULAR | Status: DC | PRN
  Administered 2016-12-25: 0.5 mL via INTRAOCULAR

## 2016-12-25 MED ORDER — EPINEPHRINE PF 1 MG/ML IJ SOLN
INTRAOCULAR | Status: DC | PRN
  Administered 2016-12-25: 1 mL via OPHTHALMIC

## 2016-12-25 MED ORDER — POVIDONE-IODINE 5 % OP SOLN
OPHTHALMIC | Status: AC
  Filled 2016-12-25: qty 30

## 2016-12-25 MED ORDER — ONDANSETRON HCL 4 MG/2ML IJ SOLN
INTRAMUSCULAR | Status: AC
  Filled 2016-12-25: qty 2

## 2016-12-25 MED ORDER — CEFUROXIME OPHTHALMIC INJECTION 1 MG/0.1 ML
INJECTION | OPHTHALMIC | Status: AC
  Filled 2016-12-25: qty 0.1

## 2016-12-25 MED ORDER — LIDOCAINE HCL (PF) 4 % IJ SOLN
INTRAMUSCULAR | Status: DC | PRN
  Administered 2016-12-25: 2 mL via OPHTHALMIC

## 2016-12-25 MED ORDER — EPINEPHRINE PF 1 MG/ML IJ SOLN
INTRAMUSCULAR | Status: AC
  Filled 2016-12-25: qty 1

## 2016-12-25 MED ORDER — FENTANYL CITRATE (PF) 100 MCG/2ML IJ SOLN
INTRAMUSCULAR | Status: DC | PRN
  Administered 2016-12-25: 25 ug via INTRAVENOUS

## 2016-12-25 MED ORDER — POLYMYXIN B-TRIMETHOPRIM 10000-0.1 UNIT/ML-% OP SOLN
1.0000 [drp] | OPHTHALMIC | Status: AC | PRN
  Administered 2016-12-25: 1 [drp] via OPHTHALMIC
  Filled 2016-12-25: qty 10

## 2016-12-25 MED ORDER — MOXIFLOXACIN HCL 0.5 % OP SOLN
OPHTHALMIC | Status: AC
  Filled 2016-12-25: qty 3

## 2016-12-25 MED ORDER — MIDAZOLAM HCL 2 MG/2ML IJ SOLN
INTRAMUSCULAR | Status: AC
  Filled 2016-12-25: qty 2

## 2016-12-25 MED ORDER — NA CHONDROIT SULF-NA HYALURON 40-17 MG/ML IO SOLN
INTRAOCULAR | Status: DC | PRN
Start: 2016-12-25 — End: 2016-12-25
  Administered 2016-12-25: 1 mL via INTRAOCULAR

## 2016-12-25 MED ORDER — FENTANYL CITRATE (PF) 100 MCG/2ML IJ SOLN
INTRAMUSCULAR | Status: AC
  Filled 2016-12-25: qty 2

## 2016-12-25 MED ORDER — POVIDONE-IODINE 5 % OP SOLN
OPHTHALMIC | Status: DC | PRN
  Administered 2016-12-25: 1 via OPHTHALMIC

## 2016-12-25 MED ORDER — NA CHONDROIT SULF-NA HYALURON 40-17 MG/ML IO SOLN
INTRAOCULAR | Status: AC
  Filled 2016-12-25: qty 1

## 2016-12-25 MED ORDER — ARMC OPHTHALMIC DILATING DROPS
1.0000 "application " | OPHTHALMIC | Status: AC | PRN
  Administered 2016-12-25 (×3): 1 via OPHTHALMIC

## 2016-12-25 MED ORDER — ARMC OPHTHALMIC DILATING DROPS
OPHTHALMIC | Status: AC
  Administered 2016-12-25: 1 via OPHTHALMIC
  Filled 2016-12-25: qty 0.4

## 2016-12-25 MED ORDER — SODIUM CHLORIDE 0.9 % IV SOLN
INTRAVENOUS | Status: DC
  Administered 2016-12-25: 06:00:00 via INTRAVENOUS

## 2016-12-25 MED ORDER — MIDAZOLAM HCL 2 MG/2ML IJ SOLN
INTRAMUSCULAR | Status: DC | PRN
  Administered 2016-12-25: 1 mg via INTRAVENOUS

## 2016-12-25 MED ORDER — POLYMYXIN B-TRIMETHOPRIM 10000-0.1 UNIT/ML-% OP SOLN
OPHTHALMIC | Status: AC
  Filled 2016-12-25: qty 10

## 2016-12-25 SURGICAL SUPPLY — 16 items
GLOVE BIO SURGEON STRL SZ8 (GLOVE) ×3 IMPLANT
GLOVE BIOGEL M 6.5 STRL (GLOVE) ×3 IMPLANT
GLOVE SURG LX 8.0 MICRO (GLOVE) ×2
GLOVE SURG LX STRL 8.0 MICRO (GLOVE) ×1 IMPLANT
GOWN STRL REUS W/ TWL LRG LVL3 (GOWN DISPOSABLE) ×2 IMPLANT
GOWN STRL REUS W/TWL LRG LVL3 (GOWN DISPOSABLE) ×4
LABEL CATARACT MEDS ST (LABEL) ×3 IMPLANT
LENS IOL TECNIS ITEC 25.5 (Intraocular Lens) ×3 IMPLANT
PACK CATARACT (MISCELLANEOUS) ×3 IMPLANT
PACK CATARACT BRASINGTON LX (MISCELLANEOUS) ×3 IMPLANT
PACK EYE AFTER SURG (MISCELLANEOUS) ×3 IMPLANT
SOL BSS BAG (MISCELLANEOUS) ×3
SOLUTION BSS BAG (MISCELLANEOUS) ×1 IMPLANT
SYR 5ML LL (SYRINGE) ×3 IMPLANT
WATER STERILE IRR 250ML POUR (IV SOLUTION) ×3 IMPLANT
WIPE NON LINTING 3.25X3.25 (MISCELLANEOUS) ×3 IMPLANT

## 2016-12-25 NOTE — H&P (Signed)
All labs reviewed. Abnormal studies sent to patients PCP when indicated.  Previous H&P reviewed, patient examined, there are NO CHANGES.  Joanna Ortiz [place]11/6/20187:12 AM

## 2016-12-25 NOTE — Anesthesia Preprocedure Evaluation (Signed)
Anesthesia Evaluation  Patient identified by MRN, date of birth, ID band Patient awake    Reviewed: Allergy & Precautions, NPO status , Patient's Chart, lab work & pertinent test results  History of Anesthesia Complications Negative for: history of anesthetic complications  Airway Mallampati: II  TM Distance: >3 FB Neck ROM: Full    Dental no notable dental hx.    Pulmonary neg sleep apnea, neg COPD, former smoker,    breath sounds clear to auscultation- rhonchi (-) wheezing      Cardiovascular Exercise Tolerance: Good (-) hypertension(-) CAD, (-) Past MI and (-) Cardiac Stents  Rhythm:Regular Rate:Normal - Systolic murmurs and - Diastolic murmurs    Neuro/Psych negative neurological ROS  negative psych ROS   GI/Hepatic negative GI ROS, Neg liver ROS,   Endo/Other  diabetesHypothyroidism   Renal/GU negative Renal ROS     Musculoskeletal  (+) Arthritis ,   Abdominal (+) - obese,   Peds  Hematology  (+) anemia ,   Anesthesia Other Findings Past Medical History: No date: Anemia No date: Arthritis No date: Collagen vascular disease ([facility]) No date: Crohn disease ([place]) No date: Diabetes mellitus without complication ([facility])     Comment:  diet controlled No date: Hypothyroidism No date: Pneumonia     Comment:  3/18 No date: Thyroid disease     Comment:  goiter   Reproductive/Obstetrics                             Anesthesia Physical Anesthesia Plan  ASA: III  Anesthesia Plan: MAC   Post-op Pain Management:    Induction: Intravenous  PONV Risk Score and Plan: 2 and Midazolam and Ondansetron  Airway Management Planned: Natural Airway  Additional Equipment:   Intra-op Plan:   Post-operative Plan:   Informed Consent: I have reviewed the patients History and Physical, chart, labs and discussed the procedure including the risks, benefits and alternatives for the proposed  anesthesia with the patient or authorized representative who has indicated his/her understanding and acceptance.     Plan Discussed with: CRNA and Anesthesiologist  Anesthesia Plan Comments:         Anesthesia Quick Evaluation

## 2016-12-25 NOTE — Anesthesia Post-op Follow-up Note (Signed)
Anesthesia QCDR form completed.        

## 2016-12-25 NOTE — Anesthesia Postprocedure Evaluation (Signed)
Anesthesia Post Note  Patient: Joanna Ortiz  Procedure(s) Performed: CATARACT EXTRACTION PHACO AND INTRAOCULAR LENS PLACEMENT (IOC) (Left Eye)  Patient location during evaluation: PACU Anesthesia Type: MAC Level of consciousness: awake and alert, awake and oriented Pain management: pain level controlled Vital Signs Assessment: post-procedure vital signs reviewed and stable Respiratory status: spontaneous breathing, nonlabored ventilation and respiratory function stable Cardiovascular status: stable Anesthetic complications: no     Last Vitals:  Vitals:   12/25/16 0616 12/25/16 0752  BP: 119/70 119/68  Pulse: 97   Resp: 17 11  Temp: 36.7 C   SpO2: 100% 100%    Last Pain:  Vitals:   12/25/16 0616  TempSrc: Oral                 Lance Muss

## 2016-12-25 NOTE — Op Note (Signed)
PREOPERATIVE DIAGNOSIS:  Nuclear sclerotic cataract of the left eye.   POSTOPERATIVE DIAGNOSIS:  nuclear sclerotic cataract left eye   OPERATIVE PROCEDURE:  Procedure(s): CATARACT EXTRACTION PHACO AND INTRAOCULAR LENS PLACEMENT (IOC)   SURGEON:  Birder Robson, MD.   ANESTHESIA:   Anesthesiologist: Emmie Niemann, MD CRNA: Lance Muss, CRNA  1.      Managed anesthesia care. 2.      Topical tetracaine drops followed by 2% Xylocaine jelly applied in the preoperative holding area.   COMPLICATIONS:  None.   TECHNIQUE:   Stop and chop   DESCRIPTION OF PROCEDURE:  The patient was examined and consented in the preoperative holding area where the aforementioned topical anesthesia was applied to the left eye and then brought back to the Operating Room where the left eye was prepped and draped in the usual sterile ophthalmic fashion and a lid speculum was placed. A paracentesis was created with the side port blade and the anterior chamber was filled with viscoelastic. A near clear corneal incision was performed with the steel keratome. A continuous curvilinear capsulorrhexis was performed with a cystotome followed by the capsulorrhexis forceps. Hydrodissection and hydrodelineation were carried out with BSS on a blunt cannula. The lens was removed in a stop and chop  technique and the remaining cortical material was removed with the irrigation-aspiration handpiece. The capsular bag was inflated with viscoelastic and the Technis [id number] lens was placed in the capsular bag without complication. The remaining viscoelastic was removed from the eye with the irrigation-aspiration handpiece. The wounds were hydrated. The anterior chamber was flushed with Miostat and the eye was inflated to physiologic pressure. 0.1 mL of cefuroxime concentration 10 mg/mL was placed in the anterior chamber. The wounds were found to be water tight. The eye was dressed with Polytrim. The patient was given protective glasses to wear  throughout the day and a shield with which to sleep tonight. The patient was also given drops with which to begin a drop regimen today and will follow-up with me in one day. Implant Name Type Inv. Item Serial No. Manufacturer Lot No. LRB No. Used  LENS IOL DIOP 25.5 - [id number] Intraocular Lens LENS IOL DIOP 25.5 [id number] AMO  Left 1   Procedure(s) with comments: CATARACT EXTRACTION PHACO AND INTRAOCULAR LENS PLACEMENT (IOC) (Left) - [place] 00:32 AP% 13.7 CDE 4.40 Fluid pack lot # [id number]  Electronically signed: [place] 12/25/2016 7:50 AM

## 2016-12-25 NOTE — Transfer of Care (Signed)
Immediate Anesthesia Transfer of Care Note  Patient: Joanna Ortiz  Procedure(s) Performed: CATARACT EXTRACTION PHACO AND INTRAOCULAR LENS PLACEMENT (IOC) (Left Eye)  Patient Location: PACU  Anesthesia Type:MAC  Level of Consciousness: awake, alert  and oriented  Airway & Oxygen Therapy: Patient Spontanous Breathing  Post-op Assessment: Report given to RN and Post -op Vital signs reviewed and stable  Post vital signs: Reviewed and stable  Last Vitals:  Vitals:   12/25/16 0616 12/25/16 0752  BP: 119/70 119/68  Pulse: 97   Resp: 17 11  Temp: 36.7 C   SpO2: 100% 100%    Last Pain:  Vitals:   12/25/16 0616  TempSrc: Oral         Complications: No apparent anesthesia complications

## 2016-12-25 NOTE — Anesthesia Post-op Follow-up Note (Deleted)
Anesthesia QCDR form completed.        

## 2016-12-25 NOTE — Discharge Instructions (Signed)
Eye Surgery Discharge Instructions    Expect mild scratchy sensation or mild soreness. DO NOT RUB YOUR EYE!  The day of surgery: . Minimal physical activity, but bed rest is not required . No reading, computer work, or close hand work . No bending, lifting, or straining. . May watch TV  For 24 hours: . No driving, legal decisions, or alcoholic beverages . Safety precautions . Eat anything you prefer: It is better to start with liquids, then soup then solid foods. . _____ Eye patch should be worn until postoperative exam tomorrow. . ____ Solar shield eyeglasses should be worn for comfort in the sunlight/patch while sleeping  Resume all regular medications including aspirin or Coumadin if these were discontinued prior to surgery. You may shower, bathe, shave, or wash your hair. Tylenol may be taken for mild discomfort.  Call your doctor if you experience significant pain, nausea, or vomiting, fever > 101 or other signs of infection. [phone] or [phone] Specific instructions:    AMBULATORY SURGERY  DISCHARGE INSTRUCTIONS   1) The drugs that you were given will stay in your system until tomorrow so for the next 24 hours you should not:  A) Drive an automobile B) Make any legal decisions C) Drink any alcoholic beverage   2) You may resume regular meals tomorrow.  Today it is better to start with liquids and gradually work up to solid foods.  You may eat anything you prefer, but it is better to start with liquids, then soup and crackers, and gradually work up to solid foods.   3) Please notify your doctor immediately if you have any unusual bleeding, trouble breathing, redness and pain at the surgery site, drainage, fever, or pain not relieved by medication.    4) Additional Instructions:        Please contact your physician with any problems or Same Day Surgery at [phone], Monday through Friday 6 am to 4 pm, or [facility] number at  [phone]. 

## 2017-02-13 DIAGNOSIS — B351 Tinea unguium: Secondary | ICD-10-CM | POA: Insufficient documentation

## 2017-03-09 ENCOUNTER — Encounter: Payer: Self-pay | Admitting: Gynecology

## 2017-03-09 ENCOUNTER — Other Ambulatory Visit: Payer: Self-pay

## 2017-03-09 ENCOUNTER — Ambulatory Visit
Admission: EM | Admit: 2017-03-09 | Discharge: 2017-03-09 | Disposition: A | Discharge status: Home / Self Care | Payer: Federal, State, Local not specified - PPO | Attending: Family Medicine | Admitting: Family Medicine

## 2017-03-09 DIAGNOSIS — R350 Frequency of micturition: Secondary | ICD-10-CM | POA: Diagnosis not present

## 2017-03-09 DIAGNOSIS — R300 Dysuria: Secondary | ICD-10-CM | POA: Diagnosis not present

## 2017-03-09 LAB — URINALYSIS, COMPLETE (UACMP) WITH MICROSCOPIC
Bilirubin Urine: NEGATIVE
GLUCOSE, UA: NEGATIVE mg/dL
Ketones, ur: NEGATIVE mg/dL
Nitrite: NEGATIVE
PH: 5 (ref 5.0–8.0)
Protein, ur: NEGATIVE mg/dL
SPECIFIC GRAVITY, URINE: 1.015 (ref 1.005–1.030)

## 2017-03-09 MED ORDER — NITROFURANTOIN MONOHYD MACRO 100 MG PO CAPS: 100.0000 mg | ORAL_CAPSULE | Freq: Two times a day (BID) | ORAL | 0 refills | Status: DC

## 2017-03-09 NOTE — ED Triage Notes (Signed)
Patient c/o of painful urination x last night.

## 2017-03-09 NOTE — ED Provider Notes (Addendum)
[facility] ____________________________________________  Time seen: Approximately 11:24 AM  I have reviewed the triage vital signs and the nursing notes.   HISTORY  Chief Complaint Urinary Tract Infection   HPI Joanna Ortiz is a 72 y.o. female presenting for evaluation of urinary frequency, urinary urgency and some burning with urination that started last night into today.  States history of urinary tract infections in the past with same presentation.  Denies any change in chronic Crohn's abdominal pain or chronic low back pain.  Denies accompanying fevers, vomiting, diarrhea.  States earlier this week she was not drinking as much fluids, and feels that this may have been a trigger.  States approximately 1 week ago she had some vaginal itching, but states that since resolved and has no longer having any vaginal discomfort.  States current complaints again feels similar to previous UTIs.  Denies recent antibiotic use.  States did try some home remedy for liquids without much change.  Denies other aggravating or alleviating factors.  Reports otherwise feels well. Denies recent sickness. Denies recent antibiotic use.  Denies renal insufficiency.  Sharyne Peach, MD: PCP   Past Medical History:  Diagnosis Date  . Anemia   . Arthritis   . Collagen vascular disease ([place])   . Crohn disease ([place])   . Diabetes mellitus without complication (HCC)    diet controlled  . Hypothyroidism   . Pneumonia    3/18  . Thyroid disease    goiter    Patient Active Problem List   Diagnosis Date Noted  . Right groin pain 06/22/2016  . Acute bronchitis 05/01/2016  . Diastasis recti 10/29/2015  . Rectal bleeding 06/10/2014  . AP (abdominal pain) 12/29/2013  . Crohn's disease ([place]) 12/18/2013    Past Surgical History:  Procedure Laterality Date  . ABDOMINAL HYSTERECTOMY  1987  . APPENDECTOMY    . BREAST BIOPSY Bilateral    neg  . CATARACT EXTRACTION W/PHACO Right 11/06/2016     Procedure: CATARACT EXTRACTION PHACO AND INTRAOCULAR LENS PLACEMENT (IOC);  Surgeon: Birder Robson, MD;  Location: [facility] OR[facility];  Service: Ophthalmology;  Laterality: Right;  [place] 00:42.6 AP% 16.7 CDE 7.14 Fluid Pack Lot # [id number]  . CATARACT EXTRACTION W/PHACO Left 12/25/2016   Procedure: CATARACT EXTRACTION PHACO AND INTRAOCULAR LENS PLACEMENT (IOC);  Surgeon: Birder Robson, MD;  Location: [facility] ORS;  Service: Ophthalmology;  Laterality: Left;  [place] 00:32 AP% 13.7 CDE 4.40 Fluid pack lot # [id number] H  . COLON SURGERY  06/06/2011   Resection of terminal ileum and previous ileocolonic anastomosis for recurrent Crohn's disease. 3 focal strictures identified.  . COLONOSCOPY  2013  . JOINT REPLACEMENT Left    SHOULDER  . SMALL INTESTINE SURGERY  2012  . THYROID SURGERY  1992  . TONSILLECTOMY       No current facility-administered medications for this encounter.   Current Outpatient Medications:  .  acetaminophen (TYLENOL) 500 MG tablet, Take 1,000 mg by mouth every 8 (eight) hours as needed for mild pain or moderate pain., Disp: , Rfl:  .  Ascorbic Acid (VITAMIN C) 1000 MG tablet, Take 1,000 mg by mouth daily., Disp: , Rfl:  .  cholestyramine (QUESTRAN) 4 G packet, Take 4 g by mouth 2 (two) times daily as needed (stomach problems). , Disp: , Rfl: 12 .  colestipol (COLESTID) 1 G tablet, Take 1 g by mouth daily as needed (for diarrhea). , Disp: , Rfl:  .  feeding supplement, GLUCERNA SHAKE, ([place]) LIQD, Take 237  mLs by mouth daily as needed (supplement). , Disp: , Rfl:  .  magnesium oxide (MAG-OX) 400 MG tablet, Take 400 mg by mouth daily., Disp: , Rfl:  .  Multiple Vitamin (MULTIVITAMIN) tablet, Take 1 tablet by mouth daily., Disp: , Rfl:  .  ondansetron (ZOFRAN-ODT) 4 MG disintegrating tablet, Take 4 mg by mouth every 8 (eight) hours as needed for nausea or vomiting. , Disp: , Rfl:  .  potassium chloride (K-DUR,KLOR-CON) 10 MEQ tablet, Take 1 tablet by mouth daily. , Disp: ,  Rfl:  .  SYNTHROID 100 MCG tablet, Take 100 mcg by mouth daily before breakfast. , Disp: , Rfl: 11 .  thyroid (ARMOUR) 30 MG tablet, Take 30 mg by mouth every morning., Disp: , Rfl:  .  TURMERIC CURCUMIN PO, Take 1 capsule by mouth daily., Disp: , Rfl:  .  ustekinumab (STELARA) 90 MG/ML SOSY injection, Inject 90 mg into the skin., Disp: , Rfl:  .  vitamin B-12 (CYANOCOBALAMIN) 1000 MCG tablet, Take 1,000 mcg by mouth daily., Disp: , Rfl:  .  nitrofurantoin, macrocrystal-monohydrate, (MACROBID) 100 MG capsule, Take 1 capsule (100 mg total) by mouth 2 (two) times daily., Disp: 10 capsule, Rfl: 0  Allergies Erythromycin; Hydromorphone; Cefuroxime; Ciprocinonide [fluocinolone]; Codeine; Floxin [ofloxacin]; Keflex [cephalexin]; Levofloxacin; Sulfa antibiotics; and Ciprofloxacin  Family History  Problem Relation Age of Onset  . Breast cancer Sister 3    Social History Social History   Tobacco Use  . Smoking status: Former Smoker    Packs/day: 1.00    Years: 12.00    Pack years: 12.00    Types: Cigarettes  . Smokeless tobacco: Never Used  Substance Use Topics  . Alcohol use: No  . Drug use: No    Review of Systems Constitutional: No fever/chills Cardiovascular: Denies chest pain. Respiratory: Denies shortness of breath. Gastrointestinal: As above. No nausea, no vomiting.  No diarrhea.   Genitourinary: positive for dysuria. Musculoskeletal: Negative for changes to chronic back pain. Skin: Negative for rash.   ____________________________________________   PHYSICAL EXAM:  VITAL SIGNS: ED Triage Vitals  Enc Vitals Group     BP 03/09/17 1027 129/69     Pulse Rate 03/09/17 1027 86     Resp 03/09/17 1027 16     Temp 03/09/17 1027 98.2 F (36.8 C)     Temp Source 03/09/17 1027 Oral     SpO2 03/09/17 1027 100 %     Weight 03/09/17 1028 165 lb (74.8 kg)     Height 03/09/17 1028 5\' 8"  (1.727 m)     Head Circumference --      Peak Flow --      Pain Score 03/09/17 1029 2      Pain Loc --      Pain Edu? --      Excl. in [place]? --     Constitutional: Alert and oriented. Well appearing and in no acute distress. Cardiovascular: Normal rate, regular rhythm. Grossly normal heart sounds.  Good peripheral circulation. Respiratory: Normal respiratory effort without tachypnea nor retractions. Breath sounds are clear and equal bilaterally. No wheezes, rales, rhonchi. Gastrointestinal: Soft and nontender.  No CVA tenderness. Musculoskeletal:  Steady gait.  Neurologic:  Normal speech and language. Speech is normal. No gait instability.  Skin:  Skin is warm, dry and intact. No rash noted. Psychiatric: Mood and affect are normal. Speech and behavior are normal. Patient exhibits appropriate insight and judgment   ___________________________________________   LABS (all labs ordered are listed, but only  abnormal results are displayed)  Labs Reviewed  URINALYSIS, COMPLETE (UACMP) WITH MICROSCOPIC - Abnormal; Notable for the following components:      Result Value   Hgb urine dipstick TRACE (*)    Leukocytes, UA TRACE (*)    Squamous Epithelial / LPF 0-5 (*)    Bacteria, UA RARE (*)    All other components within normal limits  URINE CULTURE   ____________________________________________  PROCEDURES Procedures    INITIAL IMPRESSION / ASSESSMENT AND PLAN / ED COURSE  Pertinent labs & imaging results that were available during my care of the patient were reviewed by me and considered in my medical decision making (see chart for details).  Well-appearing patient.  No acute distress.  Presenting for evaluation of dysuria since last night.  History of UTIs with similar presentation.  Patient with multiple antibiotic allergies.  Tolerated Macrobid well in the past.  As concern for UTI will empirically treat patient with oral Macrobid and await urine culture.  Discussed not clear UTI on current today's urine.  Also discussed concerns of vaginal contributing factors, patient  declines vaginal exam at this time, and states symptoms persist she will then be further evaluated.Discussed indication, risks and benefits of medications with patient.  Discussed follow up with Primary care physician this week. Discussed follow up and return parameters including no resolution or any worsening concerns. Patient verbalized understanding and agreed to plan.   ____________________________________________   FINAL CLINICAL IMPRESSION(S) / ED DIAGNOSES  Final diagnoses:  Dysuria     ED Discharge Orders        Ordered    nitrofurantoin, macrocrystal-monohydrate, (MACROBID) 100 MG capsule  2 times daily     03/09/17 1105       Note: This dictation was prepared with Dragon dictation along with smaller phrase technology. Any transcriptional errors that result from this process are unintentional.         Marylene Land, NP 03/09/17 [place]    Marylene Land, NP 03/09/17 1141

## 2017-03-09 NOTE — Discharge Instructions (Signed)
Take medication as prescribed. Rest. Drink plenty of fluids. Continue to monitor closely as discussed.   Follow up with your primary care physician this week as needed. Return to Urgent care for new or worsening concerns.

## 2017-03-11 LAB — URINE CULTURE: Culture: <10000 — AB

## 2017-03-12 ENCOUNTER — Telehealth: Payer: Self-pay

## 2017-03-12 NOTE — Telephone Encounter (Signed)
Called to follow up with patient since visit here at [facility]. Number will not dial.  Patient will call back with any questions or concerns. [facility]

## 2017-04-05 ENCOUNTER — Ambulatory Visit
Admission: EM | Admit: 2017-04-05 | Discharge: 2017-04-05 | Disposition: A | Discharge status: Home / Self Care | Payer: Federal, State, Local not specified - PPO | Attending: Emergency Medicine | Admitting: Emergency Medicine

## 2017-04-05 ENCOUNTER — Other Ambulatory Visit: Payer: Self-pay

## 2017-04-05 DIAGNOSIS — J0140 Acute pansinusitis, unspecified: Secondary | ICD-10-CM | POA: Diagnosis not present

## 2017-04-05 MED ORDER — AMOXICILLIN-POT CLAVULANATE 875-125 MG PO TABS: 1.0000 | ORAL_TABLET | Freq: Two times a day (BID) | ORAL | 0 refills | Status: DC

## 2017-04-05 MED ORDER — FLUTICASONE PROPIONATE 50 MCG/ACT NA SUSP: 2.0000 | Freq: Every day | NASAL | 0 refills | Status: DC

## 2017-04-05 NOTE — Discharge Instructions (Signed)
discontinue the Sudafed, try Mucinex D, continue Tylenol 1 g 3 times a day, start saline nasal irrigation with a Milta Deiters med sinus rinse As often as you want, Flonase.  If you not get better in a day or 2 with this, then definitely start the Augmentin.  It is also fine to start the Augmentin today if you wish.

## 2017-04-05 NOTE — ED Triage Notes (Signed)
Patient complains of sinus pain and pressure, headaches, ear pressure with yellow, green and blood tinged mucus that started 4 days ago. Patient reports no improvement with sudafed.

## 2017-04-05 NOTE — ED Provider Notes (Signed)
HPI  SUBJECTIVE:  Joanna Ortiz is a 72 y.o. female who presents with greenish yellowish blood streaked nasal congestion, rhinorrhea, postnasal drip, sinus pain and pressure for 4 days.  States that her upper teeth hurt. reports  Bilateral ear pain and a mild cough.  No change in hearing.  No fevers, facial swelling.  No allergy symptoms.  She has had  several sick contacts with URI symptoms.  She has tried Sudafed and Tylenol without relief.  Her last dose of Tylenol was last night.  Symptoms are worse with bending forward.  She has a past medical history of Crohn's for which she takes a DMARD every 8 weeks, sinusitis, arthritis.  No history of diabetes, hypertension, liver disease.  FTD:DUKGUR, Rubbie Battiest, MD   Past Medical History:  Diagnosis Date  . Anemia   . Arthritis   . Collagen vascular disease ([place])   . Crohn disease ([place])   . Diabetes mellitus without complication (HCC)    diet controlled  . Hypothyroidism   . Pneumonia    3/18  . Thyroid disease    goiter    Past Surgical History:  Procedure Laterality Date  . ABDOMINAL HYSTERECTOMY  1987  . APPENDECTOMY    . BREAST BIOPSY Bilateral    neg  . CATARACT EXTRACTION W/PHACO Right 11/06/2016   Procedure: CATARACT EXTRACTION PHACO AND INTRAOCULAR LENS PLACEMENT (IOC);  Surgeon: Birder Robson, MD;  Location: [facility] ORS;  Service: Ophthalmology;  Laterality: Right;  [place] 00:42.6 AP% 16.7 CDE 7.14 Fluid Pack Lot # [id number]  . CATARACT EXTRACTION W/PHACO Left 12/25/2016   Procedure: CATARACT EXTRACTION PHACO AND INTRAOCULAR LENS PLACEMENT (IOC);  Surgeon: Birder Robson, MD;  Location: [facility] ORS;  Service: Ophthalmology;  Laterality: Left;  [place] 00:32 AP% 13.7 CDE 4.40 Fluid pack lot # [id number]  . COLON SURGERY  06/06/2011   Resection of terminal ileum and previous ileocolonic anastomosis for recurrent Crohn's disease. 3 focal strictures identified.  . COLONOSCOPY  2013  . JOINT REPLACEMENT Left    SHOULDER  . SMALL  INTESTINE SURGERY  2012  . THYROID SURGERY  1992  . TONSILLECTOMY      Family History  Problem Relation Age of Onset  . Breast cancer Sister 49    Social History   Tobacco Use  . Smoking status: Former Smoker    Packs/day: 1.00    Years: 12.00    Pack years: 12.00    Types: Cigarettes  . Smokeless tobacco: Never Used  Substance Use Topics  . Alcohol use: No  . Drug use: No    No current facility-administered medications for this encounter.   Current Outpatient Medications:  .  acetaminophen (TYLENOL) 500 MG tablet, Take 1,000 mg by mouth every 8 (eight) hours as needed for mild pain or moderate pain., Disp: , Rfl:  .  Ascorbic Acid (VITAMIN C) 1000 MG tablet, Take 1,000 mg by mouth daily., Disp: , Rfl:  .  cholestyramine (QUESTRAN) 4 G packet, Take 4 g by mouth 2 (two) times daily as needed (stomach problems). , Disp: , Rfl: 12 .  colestipol (COLESTID) 1 G tablet, Take 1 g by mouth daily as needed (for diarrhea). , Disp: , Rfl:  .  magnesium oxide (MAG-OX) 400 MG tablet, Take 400 mg by mouth daily., Disp: , Rfl:  .  ondansetron (ZOFRAN-ODT) 4 MG disintegrating tablet, Take 4 mg by mouth every 8 (eight) hours as needed for nausea or vomiting. , Disp: , Rfl:  .  potassium chloride (  K-DUR,KLOR-CON) 10 MEQ tablet, Take 1 tablet by mouth daily. , Disp: , Rfl:  .  SYNTHROID 100 MCG tablet, Take 100 mcg by mouth daily before breakfast. , Disp: , Rfl: 11 .  thyroid (ARMOUR) 30 MG tablet, Take 30 mg by mouth every morning., Disp: , Rfl:  .  TURMERIC CURCUMIN PO, Take 1 capsule by mouth daily., Disp: , Rfl:  .  ustekinumab (STELARA) 90 MG/ML SOSY injection, Inject 90 mg into the skin., Disp: , Rfl:  .  vitamin B-12 (CYANOCOBALAMIN) 1000 MCG tablet, Take 1,000 mcg by mouth daily., Disp: , Rfl:  .  amoxicillin-clavulanate (AUGMENTIN) 875-125 MG tablet, Take 1 tablet by mouth 2 (two) times daily. X 7 days, Disp: 14 tablet, Rfl: 0 .  fluticasone (FLONASE) 50 MCG/ACT nasal spray, Place 2  sprays into both nostrils daily., Disp: 16 g, Rfl: 0  Allergies  Allergen Reactions  . Erythromycin Hives  . Hydromorphone Hives  . Cefuroxime Nausea And Vomiting  . Ciprocinonide [Fluocinolone]     Muscle pain   . Codeine     Other reaction(s): Vomiting  . Floxin [Ofloxacin] Nausea And Vomiting  . Keflex [Cephalexin] Nausea And Vomiting  . Levofloxacin Nausea And Vomiting  . Sulfa Antibiotics Nausea And Vomiting  . Ciprofloxacin Nausea And Vomiting     ROS  As noted in HPI.   Physical Exam  BP (!) 151/65 (BP Location: Left Arm)   Pulse 87   Temp 98 F (36.7 C) (Oral)   Resp 18   Ht 5\' 8"  (1.727 m)   Wt 165 lb (74.8 kg)   SpO2 100%   BMI 25.09 kg/m   Constitutional: Well developed, well nourished, no acute distress Eyes:  EOMI, conjunctiva normal bilaterally HENT: Normocephalic, atraumatic,mucus membranes moist. TMs normal bilaterally. +   purulent nasal congestion. Swollen red turbinates. +  maxillary sinus tenderness, + mild frontal sinus tenderness. Oropharynx normal  +  postnasal drip.  Respiratory: Normal inspiratory effort Cardiovascular: Normal rate GI: nondistended skin: No rash, skin intact Musculoskeletal: no deformities Neurologic: Alert & oriented x 3, no focal neuro deficits Psychiatric: Speech and behavior appropriate   ED Course   Medications - No data to display  No orders of the defined types were placed in this encounter.   No results found for this or any previous visit (from the past 24 hour(s)). No results found.  ED Clinical Impression  Acute non-recurrent pansinusitis   ED Assessment/Plan   Patient is immunocompromised, so we will send home with a wait-and-see prescription of Augmentin she does not want to automatically be put on antibiotics.  Advised her to discontinue the Sudafed, try Mucinex D, continue Tylenol 1 g 3 times a day, start saline nasal irrigation with a Milta Deiters med sinus rinse, Flonase.  If she does not get better  in a day or 2 with this, then she will start the Augmentin.  Also discussed with her that was fine to start it today as well.  Discussed MDM and plan with pt. Pt agrees with plan and will f/u with PMD prn.  *This clinic note was created using Dragon dictation software. Therefore, there may be occasional mistakes despite careful proofreading.  ?    Melynda Ripple, MD 04/05/17 [phone]

## 2017-04-30 DIAGNOSIS — G8929 Other chronic pain: Secondary | ICD-10-CM | POA: Insufficient documentation

## 2017-05-23 ENCOUNTER — Other Ambulatory Visit: Payer: Self-pay | Admitting: Rheumatology

## 2017-05-23 ENCOUNTER — Other Ambulatory Visit: Payer: Self-pay

## 2017-05-23 DIAGNOSIS — M545 Low back pain, unspecified: Secondary | ICD-10-CM

## 2017-05-23 DIAGNOSIS — M5409 Panniculitis affecting regions, neck and back, multiple sites in spine: Secondary | ICD-10-CM

## 2017-05-31 ENCOUNTER — Other Ambulatory Visit: Payer: Federal, State, Local not specified - PPO

## 2017-06-05 ENCOUNTER — Ambulatory Visit
Admission: RE | Admit: 2017-06-05 | Discharge: 2017-06-05 | Disposition: A | Discharge status: Home / Self Care | Payer: Federal, State, Local not specified - PPO | Source: Ambulatory Visit | Attending: Rheumatology | Admitting: Rheumatology

## 2017-06-05 DIAGNOSIS — M545 Low back pain, unspecified: Secondary | ICD-10-CM

## 2017-06-05 DIAGNOSIS — M5136 Other intervertebral disc degeneration, lumbar region: Secondary | ICD-10-CM | POA: Insufficient documentation

## 2017-06-05 DIAGNOSIS — G450 Vertebro-basilar artery syndrome: Secondary | ICD-10-CM | POA: Diagnosis present

## 2017-06-05 DIAGNOSIS — K529 Noninfective gastroenteritis and colitis, unspecified: Secondary | ICD-10-CM | POA: Insufficient documentation

## 2017-06-05 DIAGNOSIS — M47816 Spondylosis without myelopathy or radiculopathy, lumbar region: Secondary | ICD-10-CM | POA: Insufficient documentation

## 2017-06-05 DIAGNOSIS — G8929 Other chronic pain: Secondary | ICD-10-CM | POA: Diagnosis present

## 2017-06-05 DIAGNOSIS — M5409 Panniculitis affecting regions, neck and back, multiple sites in spine: Secondary | ICD-10-CM

## 2017-08-06 ENCOUNTER — Encounter: Payer: Self-pay | Admitting: General Surgery

## 2017-08-13 ENCOUNTER — Other Ambulatory Visit
Admission: RE | Admit: 2017-08-13 | Discharge: 2017-08-13 | Disposition: A | Discharge status: Home / Self Care | Payer: Federal, State, Local not specified - PPO | Source: Ambulatory Visit | Attending: Otolaryngology | Admitting: Otolaryngology

## 2017-08-13 DIAGNOSIS — E890 Postprocedural hypothyroidism: Secondary | ICD-10-CM | POA: Diagnosis present

## 2017-08-13 LAB — T4, FREE: FREE T4: 0.94 ng/dL (ref 0.82–1.77)

## 2017-08-13 LAB — TSH: TSH: 0.294 u[IU]/mL — ABNORMAL LOW (ref 0.350–4.500)

## 2017-08-14 LAB — T3, FREE: T3 FREE: 2.5 pg/mL (ref 2.0–4.4)

## 2017-09-18 DIAGNOSIS — K639 Disease of intestine, unspecified: Secondary | ICD-10-CM | POA: Insufficient documentation

## 2017-09-18 DIAGNOSIS — M450 Ankylosing spondylitis of multiple sites in spine: Secondary | ICD-10-CM | POA: Insufficient documentation

## 2017-11-02 ENCOUNTER — Encounter: Payer: Self-pay | Admitting: Gynecology

## 2017-11-02 ENCOUNTER — Other Ambulatory Visit: Payer: Self-pay

## 2017-11-02 ENCOUNTER — Ambulatory Visit
Admission: EM | Admit: 2017-11-02 | Discharge: 2017-11-02 | Disposition: A | Discharge status: Home / Self Care | Payer: Federal, State, Local not specified - PPO | Attending: Family Medicine | Admitting: Family Medicine

## 2017-11-02 DIAGNOSIS — Z87440 Personal history of urinary (tract) infections: Secondary | ICD-10-CM | POA: Diagnosis not present

## 2017-11-02 DIAGNOSIS — L749 Eccrine sweat disorder, unspecified: Secondary | ICD-10-CM

## 2017-11-02 LAB — URINALYSIS, COMPLETE (UACMP) WITH MICROSCOPIC
BILIRUBIN URINE: NEGATIVE
Bacteria, UA: NONE SEEN
GLUCOSE, UA: NEGATIVE mg/dL
Hgb urine dipstick: NEGATIVE
KETONES UR: NEGATIVE mg/dL
LEUKOCYTES UA: NEGATIVE
NITRITE: NEGATIVE
PH: 5 (ref 5.0–8.0)
Protein, ur: NEGATIVE mg/dL
Specific Gravity, Urine: 1.015 (ref 1.005–1.030)

## 2017-11-02 NOTE — ED Provider Notes (Signed)
[facility]    CSN: [id number] Arrival date & time: 11/02/17  0858  History   Chief Complaint Chief Complaint  Patient presents with  . Urinary Tract Infection   HPI  72 year old female presents with concerns for UTI.  Patient states that she was in [place] yesterday and felt hot and sweaty.  She had no chest pain or shortness of breath.  She has had no urinary symptoms.  Patient has had a prior UTI which led to sepsis for which she had little or no symptoms.  Given her sweating, patient thought it would be best if she come in to get evaluated for UTI.  She has no urinary symptoms at this time.  She is feeling well.  No fever.  No reports of chest pain or shortness of breath.  No abdominal pain.  No other reported symptoms.  No other complaints.  PMH, Surgical Hx, Family Hx, Social History reviewed and updated as below.  Past Medical History:  Diagnosis Date  . Anemia   . Arthritis   . Collagen vascular disease ([place])   . Crohn disease ([place])   . Diabetes mellitus without complication (HCC)    diet controlled  . Hypothyroidism   . Pneumonia    3/18  . Thyroid disease    goiter    Patient Active Problem List   Diagnosis Date Noted  . Right groin pain 06/22/2016  . Acute bronchitis 05/01/2016  . Diastasis recti 10/29/2015  . Rectal bleeding 06/10/2014  . AP (abdominal pain) 12/29/2013  . Crohn's disease ([place]) 12/18/2013    Past Surgical History:  Procedure Laterality Date  . ABDOMINAL HYSTERECTOMY  1987  . APPENDECTOMY    . BREAST BIOPSY Bilateral    neg  . CATARACT EXTRACTION W/PHACO Right 11/06/2016   Procedure: CATARACT EXTRACTION PHACO AND INTRAOCULAR LENS PLACEMENT (IOC);  Surgeon: Birder Robson, MD;  Location: [facility] ORS;  Service: Ophthalmology;  Laterality: Right;  [place] 00:42.6 AP% 16.7 CDE 7.14 Fluid Pack Lot # [id number]  . CATARACT EXTRACTION W/PHACO Left 12/25/2016   Procedure: CATARACT EXTRACTION PHACO AND INTRAOCULAR LENS PLACEMENT (IOC);   Surgeon: Birder Robson, MD;  Location: [facility] ORS;  Service: Ophthalmology;  Laterality: Left;  [place] 00:32 AP% 13.7 CDE 4.40 Fluid pack lot # [id number]  . COLON SURGERY  06/06/2011   Resection of terminal ileum and previous ileocolonic anastomosis for recurrent Crohn's disease. 3 focal strictures identified.  . COLONOSCOPY  2013  . JOINT REPLACEMENT Left    SHOULDER  . SMALL INTESTINE SURGERY  2012  . THYROID SURGERY  1992  . TONSILLECTOMY      OB History    Gravida  6   Para  4   Term      Preterm      AB  2   Living  4     SAB  2   TAB      Ectopic      Multiple      Live Births           Obstetric Comments  1st Menstrual Cycle:  14  1st Pregnancy: 19         Home Medications    Prior to Admission medications   Medication Sig Start Date End Date Taking? Authorizing Provider  acetaminophen (TYLENOL) 500 MG tablet Take 1,000 mg by mouth every 8 (eight) hours as needed for mild pain or moderate pain.   Yes [provider]  Adalimumab 40 MG/0.4ML PSKT Inject into the  skin. 10/03/17  Yes [provider]  Ascorbic Acid (VITAMIN C) 1000 MG tablet Take 1,000 mg by mouth daily.   Yes [provider]  cholestyramine Lucrezia Starch) 4 G packet Take 4 g by mouth 2 (two) times daily as needed (stomach problems).    Yes [provider]  colestipol (COLESTID) 1 G tablet Take 1 g by mouth daily as needed (for diarrhea).    Yes [provider]  diclofenac sodium (VOLTAREN) 1 % GEL Apply topically. 04/29/17 04/29/18 Yes [provider]  magnesium oxide (MAG-OX) 400 MG tablet Take 400 mg by mouth daily.   Yes [provider]  ondansetron (ZOFRAN-ODT) 4 MG disintegrating tablet Take 4 mg by mouth every 8 (eight) hours as needed for nausea or vomiting.  11/16/15  Yes [provider]  potassium chloride (K-DUR,KLOR-CON) 10 MEQ tablet Take 1 tablet by mouth daily.  05/17/16  Yes [provider]  SYNTHROID  100 MCG tablet Take 100 mcg by mouth daily before breakfast.  04/27/14  Yes [provider]  thyroid (ARMOUR) 30 MG tablet Take 30 mg by mouth every morning.   Yes [provider]  TURMERIC CURCUMIN PO Take 1 capsule by mouth daily.   Yes [provider]  amoxicillin-clavulanate (AUGMENTIN) 875-125 MG tablet Take 1 tablet by mouth 2 (two) times daily. X 7 days 04/05/17   Melynda Ripple, MD  fluticasone [facility]) 50 MCG/ACT nasal spray Place 2 sprays into both nostrils daily. 04/05/17   Melynda Ripple, MD  ustekinumab (STELARA) 90 MG/ML SOSY injection Inject 90 mg into the skin.    [provider]  vitamin B-12 (CYANOCOBALAMIN) 1000 MCG tablet Take 1,000 mcg by mouth daily.    [provider]    Family History Family History  Problem Relation Age of Onset  . Breast cancer Sister 31    Social History Social History   Tobacco Use  . Smoking status: Former Smoker    Packs/day: 1.00    Years: 12.00    Pack years: 12.00    Types: Cigarettes  . Smokeless tobacco: Never Used  Substance Use Topics  . Alcohol use: No  . Drug use: No     Allergies   Erythromycin; Hydromorphone; Cefuroxime; Ciprocinonide [fluocinolone]; Codeine; Floxin [ofloxacin]; Keflex [cephalexin]; Levofloxacin; Sulfa antibiotics; and Ciprofloxacin   Review of Systems Review of Systems  Respiratory: Negative.   Cardiovascular: Negative.   Genitourinary: Negative.    Physical Exam Triage Vital Signs ED Triage Vitals  Enc Vitals Group     BP 11/02/17 0928 128/71     Pulse Rate 11/02/17 0928 78     Resp 11/02/17 0928 16     Temp 11/02/17 0928 97.9 F (36.6 C)     Temp Source 11/02/17 0928 Oral     SpO2 11/02/17 0928 100 %     Weight 11/02/17 0924 175 lb (79.4 kg)     Height 11/02/17 0924 5\' 8"  (1.727 m)     Head Circumference --      Peak Flow --      Pain Score 11/02/17 0924 0     Pain Loc --      Pain Edu? --      Excl. in [place]? --    Updated Vital  Signs BP 128/71 (BP Location: Left Arm)   Pulse 78   Temp 97.9 F (36.6 C) (Oral)   Resp 16   Ht 5\' 8"  (1.727 m)   Wt 79.4 kg   SpO2 100%  BMI 26.61 kg/m   Visual Acuity Right Eye Distance:   Left Eye Distance:   Bilateral Distance:    Right Eye Near:   Left Eye Near:    Bilateral Near:     Physical Exam  Constitutional: She is oriented to person, place, and time. She appears well-developed. No distress.  HENT:  Head: Normocephalic and atraumatic.  Cardiovascular: Normal rate and regular rhythm.  Pulmonary/Chest: Effort normal. No respiratory distress.  Neurological: She is alert and oriented to person, place, and time.  Psychiatric: She has a normal mood and affect. Her behavior is normal.  Nursing note and vitals reviewed.  UC Treatments / Results  Labs (all labs ordered are listed, but only abnormal results are displayed) Labs Reviewed  URINALYSIS, COMPLETE (UACMP) WITH MICROSCOPIC    EKG None  Radiology No results found.  Procedures Procedures (including critical care time)  Medications Ordered in UC Medications - No data to display  Initial Impression / Assessment and Plan / UC Course  I have reviewed the triage vital signs and the nursing notes.  Pertinent labs & imaging results that were available during my care of the patient were reviewed by me and considered in my medical decision making (see chart for details).    72 year old female presents with concern for UTI.  She has no urinary symptoms.  She is well-appearing.  Her urinalysis is normal.  I have advised her that she has nothing concerning on exam or history at this time.  Final Clinical Impressions(s) / UC Diagnoses   Final diagnoses:  Sweating abnormality   Discharge Instructions   None    ED Prescriptions    None     Controlled Substance Prescriptions [place] Controlled Substance Registry consulted? Not Applicable   Coral Spikes, DO 11/02/17 1029

## 2017-11-02 NOTE — ED Triage Notes (Signed)
Per patient want to be check for UTI. Per patient with no symptoms.

## 2017-11-06 ENCOUNTER — Other Ambulatory Visit
Admission: RE | Admit: 2017-11-06 | Discharge: 2017-11-06 | Disposition: A | Discharge status: Home / Self Care | Payer: Federal, State, Local not specified - PPO | Source: Ambulatory Visit | Attending: Otolaryngology | Admitting: Otolaryngology

## 2017-11-06 DIAGNOSIS — E890 Postprocedural hypothyroidism: Secondary | ICD-10-CM | POA: Diagnosis present

## 2017-11-06 LAB — TSH: TSH: 1.635 u[IU]/mL (ref 0.350–4.500)

## 2017-11-06 LAB — T4, FREE: Free T4: 0.9 ng/dL (ref 0.82–1.77)

## 2017-11-07 LAB — T3, FREE: T3, Free: 3 pg/mL (ref 2.0–4.4)

## 2017-11-29 ENCOUNTER — Other Ambulatory Visit: Payer: Self-pay | Admitting: Medical Oncology

## 2018-01-20 DIAGNOSIS — M47812 Spondylosis without myelopathy or radiculopathy, cervical region: Secondary | ICD-10-CM | POA: Insufficient documentation

## 2018-01-30 ENCOUNTER — Ambulatory Visit: Payer: Federal, State, Local not specified - PPO | Attending: Family Medicine | Admitting: Physical Therapy

## 2018-01-30 DIAGNOSIS — M6281 Muscle weakness (generalized): Secondary | ICD-10-CM | POA: Diagnosis present

## 2018-01-30 DIAGNOSIS — M542 Cervicalgia: Secondary | ICD-10-CM

## 2018-01-30 DIAGNOSIS — M2560 Stiffness of unspecified joint, not elsewhere classified: Secondary | ICD-10-CM

## 2018-01-30 DIAGNOSIS — R293 Abnormal posture: Secondary | ICD-10-CM | POA: Diagnosis present

## 2018-01-30 NOTE — Patient Instructions (Signed)
Access Code: [email or web address]  URL: [email or web address]  Date: 01/30/2018  Prepared by: Dorcas Carrow   Exercises  Supine Chin Tuck - 5 reps - 1 sets - 10 hold - 2x daily - 7x weekly  Supine Cervical Rotation AROM on Pillow - 10 reps - 1 sets - 5 hold - 2x daily - 7x weekly  Supine Cervical Sidebending - 10 reps - 1 sets - 5 hold - 2x daily - 7x weekly  Seated Scapular Retraction - 10 reps - 1 sets - 3 hold - 2x daily - 7x weekly

## 2018-02-02 NOTE — Therapy (Signed)
[facility] [address]. [place], [place], [address] Phone: [phone]   Fax:  [phone]  Physical Therapy Evaluation  Patient Details  Name: Joanna Ortiz MRN: [id number] Date of Birth: 1945/07/06 Referring Provider (PT): Dr. Francesco Sor   Encounter Date: 01/30/2018  PT End of Session - 02/02/18 1454    Visit Number  1    Number of Visits  8    Date for PT Re-Evaluation  02/27/18    PT Start Time  1025    PT Stop Time  1120    PT Time Calculation (min)  55 min    Activity Tolerance  Patient tolerated treatment well;Patient limited by pain    Behavior During Therapy  [facility] for tasks assessed/performed       Past Medical History:  Diagnosis Date  . Anemia   . Arthritis   . Collagen vascular disease ([place])   . Crohn disease ([place])   . Diabetes mellitus without complication (HCC)    diet controlled  . Hypothyroidism   . Pneumonia    3/18  . Thyroid disease    goiter    Past Surgical History:  Procedure Laterality Date  . ABDOMINAL HYSTERECTOMY  1987  . APPENDECTOMY    . BREAST BIOPSY Bilateral    neg  . CATARACT EXTRACTION W/PHACO Right 11/06/2016   Procedure: CATARACT EXTRACTION PHACO AND INTRAOCULAR LENS PLACEMENT (IOC);  Surgeon: Birder Robson, MD;  Location: [facility] ORS;  Service: Ophthalmology;  Laterality: Right;  [place] 00:42.6 AP% 16.7 CDE 7.14 Fluid Pack Lot # [id number]  . CATARACT EXTRACTION W/PHACO Left 12/25/2016   Procedure: CATARACT EXTRACTION PHACO AND INTRAOCULAR LENS PLACEMENT (IOC);  Surgeon: Birder Robson, MD;  Location: [facility] ORS;  Service: Ophthalmology;  Laterality: Left;  [place] 00:32 AP% 13.7 CDE 4.40 Fluid pack lot # [id number]  . COLON SURGERY  06/06/2011   Resection of terminal ileum and previous ileocolonic anastomosis for recurrent Crohn's disease. 3 focal strictures identified.  . COLONOSCOPY  2013  . JOINT REPLACEMENT Left    SHOULDER  . SMALL INTESTINE SURGERY  2012  .  THYROID SURGERY  1992  . TONSILLECTOMY      There were no vitals filed for this visit.   Subjective Assessment - 02/02/18 1449    Subjective  Pt. reports chronic h/o neck pain.  Pt. has been sick the past couple of days.  Pt. reports she almost cancelled PT appt. secondary to nausea.      Limitations  Standing;Writing;House hold activities    Patient Stated Goals  Increase neck ROM/ decrease pain with daily tasks.      Currently in Pain?  Yes    Pain Score  3     Pain Location  Neck    Pain Orientation  Lower         [facility] PT Assessment - 02/02/18 0001      Assessment   Medical Diagnosis  Cervical spondylosis without myelopathy/ L cervical facet pain    Referring Provider (PT)  Dr. Rulon Sera Bhagwan    Onset Date/Surgical Date  02/19/17    Prior Therapy  yes, known well to PT after TSA      Precautions   Precautions  None      Restrictions   Weight Bearing Restrictions  No      Prior Function   Level of Independence  Independent       See HEP  Supine cervical UT/levator manual stretches 3x  L/R with static holds Supine cervical traction/ suboccipital release 3x with holds Posture eduction     PT Education - 02/02/18 1453    Education provided  Yes    Education Details  See HEP    Person(s) Educated  Patient    Methods  Explanation;Demonstration;Handout    Comprehension  Verbalized understanding;Returned demonstration          PT Long Term Goals - 02/02/18 1505      PT LONG TERM GOAL #1   Title  Pt. I with HEP to increase cervical AROM 25% to improve pain-free mobility/ driving posture.      Baseline  Pt. presents with significant cervical joint stiffness: flexion (16 deg.), extension (22 deg.), L lat. flexion (8 deg.), R lateral flexion (10 deg.), L rotn. (28 deg.), R rotn. (32 deg.). Pain limited cervical AROM with stiff end-feel.     Time  4    Period  Weeks    Status  New    Target Date  02/27/18      PT LONG TERM GOAL #2   Title  Pt. will  complete FOTO to determine initial/ goal assessment for outcome measures.     Baseline  TBD    Time  4    Period  Weeks    Status  New    Target Date  02/27/18      PT LONG TERM GOAL #3   Title  Pt. will reports <2/10 neck pain at worst with seate posture/ L cervical rotation while driving.      Baseline  >5/10 cervical pain with movement    Time  4    Period  Weeks    Status  New    Target Date  02/27/18      PT LONG TERM GOAL #4   Title  Pt. able to complete household cleaning/ dressing with no increase c/o neck pain or limitations.      Baseline  pain limited with increase movement/ household tasks.      Time  4    Period  Weeks    Status  New    Target Date  02/27/18         Plan - 02/02/18 1455    Clinical Impression Statement  Pt. is a pleasant 72 y/o female with chronic cervical spondylosis without myelopathy/ L cervical facet pain.  Pt. reports persistent neck pain and increase pain with movement/ sleeping posture.  Pt. presents with significant cervical joint stiffness:  flexion (16 deg.), extension (22 deg.), L lat. flexion (8 deg.), R lateral flexion (10 deg.), L rotn. (28 deg.), R rotn. (32 deg.).  Pain limited cervical AROM with stiff end-feel.  Moderate B UT muscle tightness/ tenderness noted during posture assessement.  B UE AROM WFL and strength grossly 4/5 MMT.  Pt. will complete FOTO next tx. visit.  Seated/standing forward head and rounded shoulder posture.  Moderate cervical/thoracic hypomobility with PA mobs in prone posture.  Pt. will benefit from skilled PT serivces to increase cervical AROM to [facility] to promote improvement in daily activity/ driving.      Clinical Presentation  Evolving    Clinical Decision Making  Moderate    Rehab Potential  Fair    PT Frequency  2x / week    PT Duration  4 weeks    PT Treatment/Interventions  Moist Heat;Cryotherapy;Electrical Stimulation;Functional mobility training;Therapeutic activities;Therapeutic exercise;Manual  techniques;Patient/family education;Passive range of motion    PT Next Visit Plan  Progress cervical ROM/  posture correction    PT Home Exercise Plan  see HEP       Patient will benefit from skilled therapeutic intervention in order to improve the following deficits and impairments:  Improper body mechanics, Pain, Postural dysfunction, Decreased mobility, Decreased activity tolerance, Decreased endurance, Decreased range of motion, Decreased strength, Hypomobility, Impaired UE functional use  Visit Diagnosis: Neck pain  Joint stiffness  Muscle weakness (generalized)  Abnormal posture     Problem List Patient Active Problem List   Diagnosis Date Noted  . Right groin pain 06/22/2016  . Acute bronchitis 05/01/2016  . Diastasis recti 10/29/2015  . Rectal bleeding 06/10/2014  . AP (abdominal pain) 12/29/2013  . Crohn's disease ([place]) 12/18/2013   Pura Spice, PT, DPT # [phone] 02/02/2018, 3:13 PM  [facility] [address] [place], [place], [address] Phone: [phone]   Fax:  [phone]  Name: Joanna Ortiz MRN: [id number] Date of Birth: [birth year]

## 2018-02-06 ENCOUNTER — Ambulatory Visit: Payer: Federal, State, Local not specified - PPO | Admitting: Physical Therapy

## 2018-02-06 ENCOUNTER — Encounter: Payer: Self-pay | Admitting: Physical Therapy

## 2018-02-06 DIAGNOSIS — M2560 Stiffness of unspecified joint, not elsewhere classified: Secondary | ICD-10-CM

## 2018-02-06 DIAGNOSIS — M542 Cervicalgia: Secondary | ICD-10-CM

## 2018-02-06 DIAGNOSIS — M6281 Muscle weakness (generalized): Secondary | ICD-10-CM

## 2018-02-06 DIAGNOSIS — R293 Abnormal posture: Secondary | ICD-10-CM

## 2018-02-06 NOTE — Therapy (Signed)
[facility] [address]. [place], [place], [address] Phone: [phone]   Fax:  [phone]  Physical Therapy Treatment  Patient Details  Name: Joanna Ortiz MRN: [id number] Date of Birth: [birth year] Referring Provider (PT): Dr. Francesco Sor   Encounter Date: 02/06/2018  PT End of Session - 02/06/18 1812    Visit Number  2    Number of Visits  8    Date for PT Re-Evaluation  02/27/18    PT Start Time  0729    PT Stop Time  0826    PT Time Calculation (min)  57 min    Activity Tolerance  Patient tolerated treatment well;Patient limited by pain    Behavior During Therapy  [facility] for tasks assessed/performed       Past Medical History:  Diagnosis Date  . Anemia   . Arthritis   . Collagen vascular disease ([place])   . Crohn disease ([place])   . Diabetes mellitus without complication (HCC)    diet controlled  . Hypothyroidism   . Pneumonia    3/18  . Thyroid disease    goiter    Past Surgical History:  Procedure Laterality Date  . ABDOMINAL HYSTERECTOMY  1987  . APPENDECTOMY    . BREAST BIOPSY Bilateral    neg  . CATARACT EXTRACTION W/PHACO Right 11/06/2016   Procedure: CATARACT EXTRACTION PHACO AND INTRAOCULAR LENS PLACEMENT (IOC);  Surgeon: Birder Robson, MD;  Location: [facility] ORS;  Service: Ophthalmology;  Laterality: Right;  [place] 00:42.6 AP% 16.7 CDE 7.14 Fluid Pack Lot # [id number]  . CATARACT EXTRACTION W/PHACO Left 12/25/2016   Procedure: CATARACT EXTRACTION PHACO AND INTRAOCULAR LENS PLACEMENT (IOC);  Surgeon: Birder Robson, MD;  Location: [facility] ORS;  Service: Ophthalmology;  Laterality: Left;  [place] 00:32 AP% 13.7 CDE 4.40 Fluid pack lot # [id number]  . COLON SURGERY  06/06/2011   Resection of terminal ileum and previous ileocolonic anastomosis for recurrent Crohn's disease. 3 focal strictures identified.  . COLONOSCOPY  2013  . JOINT REPLACEMENT Left    SHOULDER  . SMALL INTESTINE SURGERY  2012  . THYROID  SURGERY  1992  . TONSILLECTOMY      There were no vitals filed for this visit.  Subjective Assessment - 02/06/18 0732    Subjective  Pt. reports neck is stiff this morning.  Pt. continues to have GI issues and took Imodium AD prior to tx. session.      Limitations  Standing;Writing;House hold activities    Patient Stated Goals  Increase neck ROM/ decrease pain with daily tasks.      Currently in Pain?  Yes    Pain Score  3     Pain Location  Neck    Pain Orientation  Lower;Left;Right    Pain Descriptors / Indicators  Aching    Pain Type  Chronic pain        There.ex.:  Reviewed HEP.  Posture correction in seated position during use of TENS (instructed in use of 2 electrodes and tested varying modes as tolerated).    Manual tx.:  Supine cervical A/AROM all planes.  L/R UT and levator stretches 3x each with static holds.  Supine wand ex.: sh. Flexion/ press-ups during STM to B UT/ cervical paraspinals region.   Seated STM to B UT region with heat to low back.   Supine cervical traction 5x with hold  Discussed use of home traction (will attempt next tx. Session).     PT  Education - 02/06/18 1810    Education provided  Yes    Education Details  PT instructed in use of personal massager/ TENS unit for neck    Person(s) Educated  Patient    Methods  Explanation;Demonstration;Handout    Comprehension  Verbalized understanding;Returned demonstration          PT Long Term Goals - 02/02/18 1505      PT LONG TERM GOAL #1   Title  Pt. I with HEP to increase cervical AROM 25% to improve pain-free mobility/ driving posture.      Baseline  Pt. presents with significant cervical joint stiffness: flexion (16 deg.), extension (22 deg.), L lat. flexion (8 deg.), R lateral flexion (10 deg.), L rotn. (28 deg.), R rotn. (32 deg.). Pain limited cervical AROM with stiff end-feel.     Time  4    Period  Weeks    Status  New    Target Date  02/27/18      PT LONG TERM GOAL #2   Title   Pt. will complete FOTO to determine initial/ goal assessment for outcome measures.     Baseline  TBD    Time  4    Period  Weeks    Status  New    Target Date  02/27/18      PT LONG TERM GOAL #3   Title  Pt. will reports <2/10 neck pain at worst with seate posture/ L cervical rotation while driving.      Baseline  >5/10 cervical pain with movement    Time  4    Period  Weeks    Status  New    Target Date  02/27/18      PT LONG TERM GOAL #4   Title  Pt. able to complete household cleaning/ dressing with no increase c/o neck pain or limitations.      Baseline  pain limited with increase movement/ household tasks.      Time  4    Period  Weeks    Status  New    Target Date  02/27/18          Plan - 02/06/18 1813    Clinical Impression Statement  Pt. tolerated use of TENS to B UT region in seated posture during review of HEP.  Pt. demonstrates good technique with HEP but remains with significant hypomobility of cervical AROM, esp. lateral flexion (L tighter than R).  Pt. instructed to continue with posture/ HEP and use TENS to manage pain symptoms with household chores/ ex.      Clinical Presentation  Evolving    Clinical Decision Making  Moderate    Rehab Potential  Fair    PT Frequency  2x / week    PT Duration  4 weeks    PT Treatment/Interventions  Moist Heat;Cryotherapy;Electrical Stimulation;Functional mobility training;Therapeutic activities;Therapeutic exercise;Manual techniques;Patient/family education;Passive range of motion    PT Next Visit Plan  Progress cervical ROM/ posture correction    PT Home Exercise Plan  see HEP       Patient will benefit from skilled therapeutic intervention in order to improve the following deficits and impairments:  Improper body mechanics, Pain, Postural dysfunction, Decreased mobility, Decreased activity tolerance, Decreased endurance, Decreased range of motion, Decreased strength, Hypomobility, Impaired UE functional use  Visit  Diagnosis: Neck pain  Joint stiffness  Muscle weakness (generalized)  Abnormal posture     Problem List Patient Active Problem List   Diagnosis Date Noted  .  Right groin pain 06/22/2016  . Acute bronchitis 05/01/2016  . Diastasis recti 10/29/2015  . Rectal bleeding 06/10/2014  . AP (abdominal pain) 12/29/2013  . Crohn's disease ([place]) 12/18/2013   Pura Spice, PT, DPT # [phone] 02/06/2018, 6:16 PM  [facility] [address] [place], [place], [address] Phone: [phone]   Fax:  [phone]  Name: WILLENA JEANCHARLES MRN: [id number] Date of Birth: [birth year]

## 2018-02-13 ENCOUNTER — Ambulatory Visit: Payer: Federal, State, Local not specified - PPO | Admitting: Physical Therapy

## 2018-02-13 ENCOUNTER — Encounter: Payer: Self-pay | Admitting: Physical Therapy

## 2018-02-13 DIAGNOSIS — M6281 Muscle weakness (generalized): Secondary | ICD-10-CM

## 2018-02-13 DIAGNOSIS — R293 Abnormal posture: Secondary | ICD-10-CM

## 2018-02-13 DIAGNOSIS — M542 Cervicalgia: Secondary | ICD-10-CM

## 2018-02-13 DIAGNOSIS — M2560 Stiffness of unspecified joint, not elsewhere classified: Secondary | ICD-10-CM

## 2018-02-13 NOTE — Therapy (Signed)
[facility] [address] [place], [place], [address] Phone: [phone]   Fax:  [phone]  Physical Therapy Treatment  Patient Details  Name: Joanna Ortiz MRN: [id number] Date of Birth: [birth year] Referring Provider (PT): Dr. Francesco Sor   Encounter Date: 02/13/2018  PT End of Session - 02/13/18 0915    Visit Number  3    Number of Visits  8    Date for PT Re-Evaluation  02/27/18    PT Start Time  0825    PT Stop Time  0911    PT Time Calculation (min)  46 min    Activity Tolerance  Patient tolerated treatment well;Patient limited by pain    Behavior During Therapy  [facility] for tasks assessed/performed       Past Medical History:  Diagnosis Date  . Anemia   . Arthritis   . Collagen vascular disease ([place])   . Crohn disease ([place])   . Diabetes mellitus without complication (HCC)    diet controlled  . Hypothyroidism   . Pneumonia    3/18  . Thyroid disease    goiter    Past Surgical History:  Procedure Laterality Date  . ABDOMINAL HYSTERECTOMY  1987  . APPENDECTOMY    . BREAST BIOPSY Bilateral    neg  . CATARACT EXTRACTION W/PHACO Right 11/06/2016   Procedure: CATARACT EXTRACTION PHACO AND INTRAOCULAR LENS PLACEMENT (IOC);  Surgeon: Birder Robson, MD;  Location: [facility] ORS;  Service: Ophthalmology;  Laterality: Right;  [place] 00:42.6 AP% 16.7 CDE 7.14 Fluid Pack Lot # [id number]  . CATARACT EXTRACTION W/PHACO Left 12/25/2016   Procedure: CATARACT EXTRACTION PHACO AND INTRAOCULAR LENS PLACEMENT (IOC);  Surgeon: Birder Robson, MD;  Location: [facility] OR[facility];  Service: Ophthalmology;  Laterality: Left;  [place] 00:32 AP% 13.7 CDE 4.40 Fluid pack lot # [id number]  . COLON SURGERY  06/06/2011   Resection of terminal ileum and previous ileocolonic anastomosis for recurrent Crohn's disease. 3 focal strictures identified.  . COLONOSCOPY  2013  . JOINT REPLACEMENT Left    SHOULDER  . SMALL INTESTINE SURGERY  2012  . THYROID  SURGERY  1992  . TONSILLECTOMY      There were no vitals filed for this visit.  Subjective Assessment - 02/13/18 0914    Subjective  Pt. reports moderate stiffness in neck (L>R) this morning.  Pt. states she used heat and TENS on neck at home last night.  Pt. discussed her reading position in bed and PT discussed alternative positions to decrease neck discomfort.      Limitations  Standing;Writing;House hold activities    Patient Stated Goals  Increase neck ROM/ decrease pain with daily tasks.      Currently in Pain?  Yes    Pain Score  4     Pain Location  Neck    Pain Orientation  Lower;Left;Right    Pain Descriptors / Indicators  Aching    Pain Type  Chronic pain          There.ex.:  Supine chin tucks 10x with manual feedback.  Supine B shoulder flexion/ horizontal abduction 20x each with neck in neutral position. Seated UT/levator stretches 3x each. Reviewed HEP  Manual tx.:  Supine L/R UT, levator and rotn. Stretches 5x each.  STM to L/R cervical and upper thoracic paraspinals during AROM.   Manual cervical traction 5x with holds (no increase pain)-  Pt. Reports "feels good" Home [employer] cervical traction unit:  Proper set-up and instructed  pt. In use.  Pt. Tolerates 4 min. x 2.  PT pumped traction unit to 20# each time and pt. Able to demonstrate proper technique.  PT issued home unit for a trial this weekend.        PT Long Term Goals - 02/02/18 1505      PT LONG TERM GOAL #1   Title  Pt. I with HEP to increase cervical AROM 25% to improve pain-free mobility/ driving posture.      Baseline  Pt. presents with significant cervical joint stiffness: flexion (16 deg.), extension (22 deg.), L lat. flexion (8 deg.), R lateral flexion (10 deg.), L rotn. (28 deg.), R rotn. (32 deg.). Pain limited cervical AROM with stiff end-feel.     Time  4    Period  Weeks    Status  New    Target Date  02/27/18      PT LONG TERM GOAL #2   Title  Pt. will complete FOTO to determine  initial/ goal assessment for outcome measures.     Baseline  TBD    Time  4    Period  Weeks    Status  New    Target Date  02/27/18      PT LONG TERM GOAL #3   Title  Pt. will reports <2/10 neck pain at worst with seate posture/ L cervical rotation while driving.      Baseline  >5/10 cervical pain with movement    Time  4    Period  Weeks    Status  New    Target Date  02/27/18      PT LONG TERM GOAL #4   Title  Pt. able to complete household cleaning/ dressing with no increase c/o neck pain or limitations.      Baseline  pain limited with increase movement/ household tasks.      Time  4    Period  Weeks    Status  New    Target Date  02/27/18            Plan - 02/13/18 0916    Clinical Impression Statement  Pt. proper sized for use of home Sa[employer] cervical traction unit and able to demonstrate proper set-up/use.  L cervical tenderness with palpation and hypomobility note in all planes of movement.  Good technique with supine cervical chin tucks/ isometrics with no increase c/o pain.      Clinical Presentation  Evolving    Clinical Decision Making  Moderate    Rehab Potential  Fair    PT Frequency  2x / week    PT Duration  4 weeks    PT Treatment/Interventions  Moist Heat;Cryotherapy;Electrical Stimulation;Functional mobility training;Therapeutic activities;Therapeutic exercise;Manual techniques;Patient/family education;Passive range of motion    PT Next Visit Plan  Progress cervical ROM/ posture correction.  Discuss home cervical traction    PT Home Exercise Plan  see HEP       Patient will benefit from skilled therapeutic intervention in order to improve the following deficits and impairments:  Improper body mechanics, Pain, Postural dysfunction, Decreased mobility, Decreased activity tolerance, Decreased endurance, Decreased range of motion, Decreased strength, Hypomobility, Impaired UE functional use  Visit Diagnosis: Neck pain  Joint stiffness  Muscle  weakness (generalized)  Abnormal posture     Problem List Patient Active Problem List   Diagnosis Date Noted  . Right groin pain 06/22/2016  . Acute bronchitis 05/01/2016  . Diastasis recti 10/29/2015  . Rectal bleeding 06/10/2014  .  AP (abdominal pain) 12/29/2013  . Crohn's disease ([place]) 12/18/2013   Pura Spice, PT, DPT # [id number] 02/13/2018, 9:20 AM  [facility] [address] [place], [place], [address] Phone: [phone]   Fax:  [phone]  Name: Joanna Ortiz MRN: [id number] Date of Birth: [birth year]

## 2018-02-20 ENCOUNTER — Ambulatory Visit: Payer: Federal, State, Local not specified - PPO | Admitting: Physical Therapy

## 2018-02-21 ENCOUNTER — Ambulatory Visit: Payer: Federal, State, Local not specified - PPO | Attending: Family Medicine | Admitting: Physical Therapy

## 2018-02-21 DIAGNOSIS — R293 Abnormal posture: Secondary | ICD-10-CM | POA: Insufficient documentation

## 2018-02-21 DIAGNOSIS — M2560 Stiffness of unspecified joint, not elsewhere classified: Secondary | ICD-10-CM | POA: Diagnosis present

## 2018-02-21 DIAGNOSIS — M542 Cervicalgia: Secondary | ICD-10-CM | POA: Insufficient documentation

## 2018-02-21 DIAGNOSIS — M6281 Muscle weakness (generalized): Secondary | ICD-10-CM | POA: Diagnosis present

## 2018-02-21 NOTE — Therapy (Addendum)
[facility] [facility] [address] [place], [place], [address] Phone: [phone]   Fax:  [phone]  Physical Therapy Treatment  Patient Details  Name: Joanna Ortiz MRN: [id number] Date of Birth: Jan 29, 1946 Referring Provider (PT): Dr. Francesco Sor   Encounter Date: 02/21/2018    Treatment: 4 of 8.  Recert date: 10/23/8544 2703 to 44   Past Medical History:  Diagnosis Date  . Anemia   . Arthritis   . Collagen vascular disease ([place])   . Crohn disease ([place])   . Diabetes mellitus without complication (HCC)    diet controlled  . Hypothyroidism   . Pneumonia    3/18  . Thyroid disease    goiter    Past Surgical History:  Procedure Laterality Date  . ABDOMINAL HYSTERECTOMY  1987  . APPENDECTOMY    . BREAST BIOPSY Bilateral    neg  . CATARACT EXTRACTION W/PHACO Right 11/06/2016   Procedure: CATARACT EXTRACTION PHACO AND INTRAOCULAR LENS PLACEMENT (IOC);  Surgeon: Birder Robson, MD;  Location: [facility];  Service: Ophthalmology;  Laterality: Right;  [place] 00:42.6 AP% 16.7 CDE 7.14 Fluid Pack Lot # [id number]  . CATARACT EXTRACTION W/PHACO Left 12/25/2016   Procedure: CATARACT EXTRACTION PHACO AND INTRAOCULAR LENS PLACEMENT (IOC);  Surgeon: Birder Robson, MD;  Location: [facility];  Service: Ophthalmology;  Laterality: Left;  [place] 00:32 AP% 13.7 CDE 4.40 Fluid pack lot # [id number]  . COLON SURGERY  06/06/2011   Resection of terminal ileum and previous ileocolonic anastomosis for recurrent Crohn's disease. 3 focal strictures identified.  . COLONOSCOPY  2013  . JOINT REPLACEMENT Left    SHOULDER  . LEFT HEART CATH AND CORONARY ANGIOGRAPHY Left 04/04/2018   Procedure: LEFT HEART CATH AND CORONARY ANGIOGRAPHY;  Surgeon: Corey Skains, MD;  Location: [facility];  Service: Cardiovascular;  Laterality: Left;  . SMALL INTESTINE SURGERY  2012  . THYROID SURGERY  1992  . TONSILLECTOMY      There were no vitals filed  for this visit.    Pt. states she benefits from use of traction for pain control. Pt. will continue to use [facility] Traction at this time.         There.ex.:  Supine chin tucks 10x.  Seated chin tucks 3x.  Supine B shoulder flexion/ horizontal abduction 20x each with neck in neutral position. Seated UT/levator stretches 3x each. RTB scap. Retraction/ sh. Extension/ tricep extension/ bicep curls 20x each Discussed HEP in depth  Manual tx.:  Supine L/R UT, levator and rotn. Stretches 5x each.  STM to L/R cervical and upper thoracic paraspinals during AROM- 21 min.   Manual cervical traction 5x with holds (no increase pain)-  no increase pain    PT Long Term Goals - 02/21/18 1113      PT LONG TERM GOAL #1   Title  Pt. I with HEP to increase cervical AROM 25% to improve pain-free mobility/ driving posture.      Baseline  Significant joint stiffness.    Time  4    Period  Weeks    Status  Not Met    Target Date  02/21/18      PT LONG TERM GOAL #2   Title  Pt. will complete FOTO to determine initial/ goal assessment for outcome measures.     Baseline  TBD    Time  4    Period  Weeks    Status  On-going    Target  Date  02/21/18      PT LONG TERM GOAL #3   Title  Pt. will reports <2/10 neck pain at worst with seate posture/ L cervical rotation while driving.      Baseline  >5/10 cervical pain with movement    Time  4    Period  Weeks    Status  Not Met    Target Date  02/21/18      PT LONG TERM GOAL #4   Title  Pt. able to complete household cleaning/ dressing with no increase c/o neck pain or limitations.      Baseline  pain limited with increase movement/ household tasks.      Time  4    Period  Weeks    Status  Not Met    Target Date  02/21/18        Pt. reports no pain with RTB ther.ex./ posture ex. Pt. understands HEP and importance of stretching. Pt. instructed to contact PT if any questions or regression in pain symptoms. Discharge from PT at this  time.        Patient will benefit from skilled therapeutic intervention in order to improve the following deficits and impairments:  Improper body mechanics, Pain, Postural dysfunction, Decreased mobility, Decreased activity tolerance, Decreased endurance, Decreased range of motion, Decreased strength, Hypomobility, Impaired UE functional use  Visit Diagnosis: Neck pain  Joint stiffness  Muscle weakness (generalized)  Abnormal posture     Problem List Patient Active Problem List   Diagnosis Date Noted  . Stable angina ([place]) 04/03/2018  . Right groin pain 06/22/2016  . Acute bronchitis 05/01/2016  . Diastasis recti 10/29/2015  . Rectal bleeding 06/10/2014  . AP (abdominal pain) 12/29/2013  . Crohn's disease ([place]) 12/18/2013   Pura Spice, PT, DPT # [id number] 02/21/2018, 11:14 AM  [place] [facility] [address] [place], [place], [address] Phone: [phone]   Fax:  [phone]  Name: Joanna Ortiz MRN: [id number] Date of Birth: [birth year]

## 2018-02-26 ENCOUNTER — Other Ambulatory Visit: Payer: Self-pay

## 2018-02-26 ENCOUNTER — Ambulatory Visit
Admission: EM | Admit: 2018-02-26 | Discharge: 2018-02-26 | Disposition: A | Discharge status: Home / Self Care | Payer: Federal, State, Local not specified - PPO | Attending: Family Medicine | Admitting: Family Medicine

## 2018-02-26 ENCOUNTER — Other Ambulatory Visit: Payer: Self-pay | Admitting: Family Medicine

## 2018-02-26 DIAGNOSIS — E119 Type 2 diabetes mellitus without complications: Secondary | ICD-10-CM

## 2018-02-26 DIAGNOSIS — T380X5A Adverse effect of glucocorticoids and synthetic analogues, initial encounter: Secondary | ICD-10-CM

## 2018-02-26 DIAGNOSIS — R739 Hyperglycemia, unspecified: Secondary | ICD-10-CM

## 2018-02-26 LAB — COMPREHENSIVE METABOLIC PANEL
ALBUMIN: 3.8 g/dL (ref 3.5–5.0)
ALT: 63 U/L — AB (ref 0–44)
AST: 66 U/L — AB (ref 15–41)
Alkaline Phosphatase: 102 U/L (ref 38–126)
Anion gap: 12 (ref 5–15)
BUN: 18 mg/dL (ref 8–23)
CHLORIDE: 104 mmol/L (ref 98–111)
CO2: 23 mmol/L (ref 22–32)
CREATININE: 0.83 mg/dL (ref 0.44–1.00)
Calcium: 8.3 mg/dL — ABNORMAL LOW (ref 8.9–10.3)
GFR calc Af Amer: >60 mL/min (ref >60)
GFR calc non Af Amer: >60 mL/min (ref >60)
GLUCOSE: 358 mg/dL — AB (ref 70–99)
POTASSIUM: 4 mmol/L (ref 3.5–5.1)
Sodium: 139 mmol/L (ref 135–145)
Total Bilirubin: 0.5 mg/dL (ref 0.3–1.2)
Total Protein: 7.2 g/dL (ref 6.5–8.1)

## 2018-02-26 LAB — HEMOGLOBIN A1C
Hgb A1c MFr Bld: 7.5 % — ABNORMAL HIGH (ref 4.8–5.6)
Mean Plasma Glucose: 168.55 mg/dL

## 2018-02-26 LAB — GLUCOSE, CAPILLARY: GLUCOSE-CAPILLARY: 335 mg/dL — AB (ref 70–99)

## 2018-02-26 MED ORDER — BLOOD GLUCOSE MONITOR KIT: PACK | 0 refills | Status: DC

## 2018-02-26 MED ORDER — METFORMIN HCL 500 MG PO TABS: 500.0000 mg | ORAL_TABLET | Freq: Two times a day (BID) | ORAL | 1 refills | Status: DC

## 2018-02-26 MED ORDER — INSULIN PEN NEEDLE 32G X 4 MM MISC: 1 refills | Status: DC

## 2018-02-26 MED ORDER — INSULIN GLARGINE 100 UNIT/ML SOLOSTAR PEN: 10.0000 [IU] | PEN_INJECTOR | Freq: Every day | SUBCUTANEOUS | 1 refills | Status: DC

## 2018-02-26 NOTE — Discharge Instructions (Signed)
Metformin as prescribed.  Lantus 10 units daily. Increase by 1 unit daily until blood sugars are consistently below 200. You may need to decrease slowly as prednisone decreases.  Follow up with your PCP.  Take care  Dr. Lacinda Axon

## 2018-02-26 NOTE — ED Provider Notes (Signed)
[facility]    CSN: [id number] Arrival date & time: 02/26/18  1504  History   Chief Complaint Chief Complaint  Patient presents with  . Hyperglycemia   HPI  73 year old female with type 2 diabetes, Crohn's disease, ankylosing spondylitis presents with suspected hyperglycemia.  Patient's diabetes has previously been diet-controlled after weight loss.  However, patient has had recent and quite prolonged use of corticosteroids.  She was just put back on a long taper by her GI physician.  Patient states that she does not feel well.  She states that she feels shaky.  She is concerned about blood sugar issues.  Patient states she has had difficulty with sleeping as well.  This is all likely secondary to the corticosteroid use.  Patient is aware of this.  Patient has an upcoming appointment with her primary care physician but did not feel she could wait.  No other reported complaints at this time.  Past Medical History:  Diagnosis Date  . Anemia   . Arthritis   . Collagen vascular disease ([place])   . Crohn disease ([place])   . Diabetes mellitus without complication (HCC)    diet controlled  . Hypothyroidism   . Pneumonia    3/18  . Thyroid disease    goiter    Patient Active Problem List   Diagnosis Date Noted  . Right groin pain 06/22/2016  . Acute bronchitis 05/01/2016  . Diastasis recti 10/29/2015  . Rectal bleeding 06/10/2014  . AP (abdominal pain) 12/29/2013  . Crohn's disease ([place]) 12/18/2013    Past Surgical History:  Procedure Laterality Date  . ABDOMINAL HYSTERECTOMY  1987  . APPENDECTOMY    . BREAST BIOPSY Bilateral    neg  . CATARACT EXTRACTION W/PHACO Right 11/06/2016   Procedure: CATARACT EXTRACTION PHACO AND INTRAOCULAR LENS PLACEMENT (IOC);  Surgeon: Birder Robson, MD;  Location: [facility];  Service: Ophthalmology;  Laterality: Right;  [place] 00:42.6 AP% 16.7 CDE 7.14 Fluid Pack Lot # [id number]  . CATARACT EXTRACTION W/PHACO Left 12/25/2016   Procedure: CATARACT EXTRACTION PHACO AND INTRAOCULAR LENS PLACEMENT (IOC);  Surgeon: Birder Robson, MD;  Location: [facility];  Service: Ophthalmology;  Laterality: Left;  [place] 00:32 AP% 13.7 CDE 4.40 Fluid pack lot # [id number]  . COLON SURGERY  06/06/2011   Resection of terminal ileum and previous ileocolonic anastomosis for recurrent Crohn's disease. 3 focal strictures identified.  . COLONOSCOPY  2013  . JOINT REPLACEMENT Left    SHOULDER  . SMALL INTESTINE SURGERY  2012  . THYROID SURGERY  1992  . TONSILLECTOMY      OB History    Gravida  6   Para  4   Term      Preterm      AB  2   Living  4     SAB  2   TAB      Ectopic      Multiple      Live Births           Obstetric Comments  1st Menstrual Cycle:  14  1st Pregnancy: 19         Home Medications    Prior to Admission medications   Medication Sig Start Date End Date Taking? Authorizing Provider  acetaminophen (TYLENOL) 500 MG tablet Take 1,000 mg by mouth every 8 (eight) hours as needed for mild pain or moderate pain.   Yes [provider]  Adalimumab 40 MG/0.4ML PSKT Inject into the skin. 10/03/17  Yes [provider]  Ascorbic Acid (VITAMIN C) 1000 MG tablet Take 1,000 mg by mouth daily.   Yes [provider]  cholestyramine Lucrezia Starch) 4 G packet Take 4 g by mouth 2 (two) times daily as needed (stomach problems).    Yes [provider]  colestipol (COLESTID) 1 G tablet Take 1 g by mouth daily as needed (for diarrhea).    Yes [provider]  magnesium oxide (MAG-OX) 400 MG tablet Take 400 mg by mouth daily.   Yes [provider]  ondansetron (ZOFRAN-ODT) 4 MG disintegrating tablet Take 4 mg by mouth every 8 (eight) hours as needed for nausea or vomiting.  11/16/15  Yes [provider]  thyroid (ARMOUR) 30 MG tablet Take 30 mg by mouth every morning.   Yes [provider]  vitamin B-12 (CYANOCOBALAMIN) 1000 MCG tablet Take 1,000 mcg  by mouth daily.   Yes [provider]  blood glucose meter kit and supplies KIT Dispense based on patient and insurance preference. Use up to four times daily as directed. 02/26/18   Coral Spikes, DO  Insulin Glargine (LANTUS) 100 UNIT/ML Solostar Pen Inject 10 Units into the skin daily. 02/26/18   Coral Spikes, DO  Insulin Pen Needle 32G X 4 MM MISC Use as directed to administer insulin. 02/26/18   Coral Spikes, DO  metFORMIN (GLUCOPHAGE) 500 MG tablet Take 1 tablet (500 mg total) by mouth 2 (two) times daily with a meal. 02/26/18 04/27/18  Coral Spikes, DO  SYNTHROID 100 MCG tablet Take 100 mcg by mouth daily before breakfast.  04/27/14   [provider]    Family History Family History  Problem Relation Age of Onset  . Breast cancer Sister 62    Social History Social History   Tobacco Use  . Smoking status: Former Smoker    Packs/day: 1.00    Years: 12.00    Pack years: 12.00    Types: Cigarettes  . Smokeless tobacco: Never Used  Substance Use Topics  . Alcohol use: No  . Drug use: No     Allergies   Erythromycin; Hydromorphone; Cefuroxime; Ciprocinonide [fluocinolone]; Codeine; Floxin [ofloxacin]; Keflex [cephalexin]; Levofloxacin; Sulfa antibiotics; and Ciprofloxacin   Review of Systems Review of Systems  Constitutional: Positive for fatigue.  Psychiatric/Behavioral: Positive for sleep disturbance.   Physical Exam Triage Vital Signs ED Triage Vitals  Enc Vitals Group     BP 02/26/18 1515 (!) 152/76     Pulse Rate 02/26/18 1515 95     Resp 02/26/18 1515 18     Temp 02/26/18 1515 98 F (36.7 C)     Temp Source 02/26/18 1515 Oral     SpO2 02/26/18 1515 99 %     Weight 02/26/18 1513 196 lb (88.9 kg)     Height 02/26/18 1513 '5\' 8"'$  (1.727 m)     Head Circumference --      Peak Flow --      Pain Score 02/26/18 1513 0     Pain Loc --      Pain Edu? --      Excl. in [place]? --    Updated Vital Signs BP (!) 152/76 (BP Location: Left Arm)   Pulse 95    Temp 98 F (36.7 C) (Oral)   Resp 18   Ht '5\' 8"'$  (1.727 m)   Wt 88.9 kg   SpO2 99%   BMI 29.80 kg/m   Visual Acuity Right Eye Distance:   Left Eye  Distance:   Bilateral Distance:    Right Eye Near:   Left Eye Near:    Bilateral Near:     Physical Exam Vitals signs and nursing note reviewed.  Constitutional:      General: She is not in acute distress. HENT:     Head: Normocephalic and atraumatic.  Eyes:     General: No scleral icterus.    Conjunctiva/sclera: Conjunctivae normal.  Cardiovascular:     Rate and Rhythm: Normal rate and regular rhythm.  Pulmonary:     Effort: Pulmonary effort is normal. No respiratory distress.  Neurological:     Mental Status: She is alert.  Psychiatric:        Mood and Affect: Mood normal.        Behavior: Behavior normal.    UC Treatments / Results  Labs (all labs ordered are listed, but only abnormal results are displayed) Labs Reviewed  GLUCOSE, CAPILLARY - Abnormal; Notable for the following components:      Result Value   Glucose-Capillary 335 (*)    All other components within normal limits  COMPREHENSIVE METABOLIC PANEL - Abnormal; Notable for the following components:   Glucose, Bld 358 (*)    Calcium 8.3 (*)    AST 66 (*)    ALT 63 (*)    All other components within normal limits  HEMOGLOBIN A1C  CBG MONITORING, ED    EKG None  Radiology No results found.  Procedures Procedures (including critical care time)  Medications Ordered in UC Medications - No data to display  Initial Impression / Assessment and Plan / UC Course  I have reviewed the triage vital signs and the nursing notes.  Pertinent labs & imaging results that were available during my care of the patient were reviewed by me and considered in my medical decision making (see chart for details).    73 year old female who was previously diet-controlled regarding her diabetes presents with hyperglycemia in setting of corticosteroid use.  Laboratory  studies obtained today.  Awaiting A1c.  Blood sugar 335 today.  Starting on metformin, insulin.  Advised to follow-up with her primary care physician.  Final Clinical Impressions(s) / UC Diagnoses   Final diagnoses:  Type 2 diabetes mellitus without complication, without long-term current use of insulin (HCC)  Steroid-induced hyperglycemia     Discharge Instructions     Metformin as prescribed.  Lantus 10 units daily. Increase by 1 unit daily until blood sugars are consistently below 200. You may need to decrease slowly as prednisone decreases.  Follow up with your PCP.  Take care  Dr. Lacinda Axon    ED Prescriptions    Medication Sig Dispense Auth. Provider   metFORMIN (GLUCOPHAGE) 500 MG tablet Take 1 tablet (500 mg total) by mouth 2 (two) times daily with a meal. 60 tablet Chaley Castellanos G, DO   Insulin Pen Needle 32G X 4 MM MISC Use as directed to administer insulin. 100 each Boyd Litaker, [place] G, DO   blood glucose meter kit and supplies KIT Dispense based on patient and insurance preference. Use up to four times daily as directed. 1 each Coral Spikes, DO   Insulin Glargine (LANTUS) 100 UNIT/ML Solostar Pen Inject 10 Units into the skin daily. 15 mL Coral Spikes, DO     Controlled Substance Prescriptions  Controlled Substance Registry consulted? Not Applicable   Coral Spikes, DO 02/26/18 2956

## 2018-02-26 NOTE — ED Triage Notes (Signed)
Patient states that she used to be diabetic and has come off all of the diabetes medication due to weightloss. Patient states that she is currently taking a lot of prednisone and thinks her blood sugar may be elevated again. Patient states that she has shakiness and "just don't feel right".

## 2018-02-27 ENCOUNTER — Ambulatory Visit: Payer: Federal, State, Local not specified - PPO | Admitting: Physical Therapy

## 2018-03-02 ENCOUNTER — Telehealth (HOSPITAL_COMMUNITY): Payer: Self-pay | Admitting: Emergency Medicine

## 2018-03-02 NOTE — Telephone Encounter (Signed)
A1C mildly elevated, attempted to contact patient to notify them. No answer.

## 2018-03-03 ENCOUNTER — Telehealth (HOSPITAL_COMMUNITY): Payer: Self-pay | Admitting: Emergency Medicine

## 2018-03-03 NOTE — Telephone Encounter (Signed)
Spoke with pt lab results given; pt verbalized understanding

## 2018-03-11 DIAGNOSIS — R9431 Abnormal electrocardiogram [ECG] [EKG]: Secondary | ICD-10-CM | POA: Insufficient documentation

## 2018-03-11 DIAGNOSIS — R0602 Shortness of breath: Secondary | ICD-10-CM | POA: Insufficient documentation

## 2018-03-11 DIAGNOSIS — E782 Mixed hyperlipidemia: Secondary | ICD-10-CM | POA: Insufficient documentation

## 2018-03-19 DIAGNOSIS — M4810 Ankylosing hyperostosis [Forestier], site unspecified: Secondary | ICD-10-CM | POA: Insufficient documentation

## 2018-03-20 ENCOUNTER — Other Ambulatory Visit: Payer: Self-pay | Admitting: Family Medicine

## 2018-04-03 DIAGNOSIS — I208 Other forms of angina pectoris: Secondary | ICD-10-CM | POA: Diagnosis present

## 2018-04-04 ENCOUNTER — Ambulatory Visit
Admission: RE | Admit: 2018-04-04 | Discharge: 2018-04-04 | Disposition: A | Discharge status: Home / Self Care | Payer: Federal, State, Local not specified - PPO | Attending: Internal Medicine | Admitting: Internal Medicine

## 2018-04-04 ENCOUNTER — Encounter: Payer: Self-pay | Admitting: *Deleted

## 2018-04-04 ENCOUNTER — Encounter
Admission: RE | Disposition: A | Discharge status: Home / Self Care | Payer: Self-pay | Source: Home / Self Care | Attending: Internal Medicine

## 2018-04-04 ENCOUNTER — Other Ambulatory Visit: Payer: Self-pay

## 2018-04-04 DIAGNOSIS — R079 Chest pain, unspecified: Secondary | ICD-10-CM

## 2018-04-04 DIAGNOSIS — Z79890 Hormone replacement therapy (postmenopausal): Secondary | ICD-10-CM | POA: Diagnosis not present

## 2018-04-04 DIAGNOSIS — Z79899 Other long term (current) drug therapy: Secondary | ICD-10-CM | POA: Diagnosis not present

## 2018-04-04 DIAGNOSIS — E785 Hyperlipidemia, unspecified: Secondary | ICD-10-CM | POA: Insufficient documentation

## 2018-04-04 DIAGNOSIS — E119 Type 2 diabetes mellitus without complications: Secondary | ICD-10-CM | POA: Insufficient documentation

## 2018-04-04 DIAGNOSIS — E039 Hypothyroidism, unspecified: Secondary | ICD-10-CM | POA: Insufficient documentation

## 2018-04-04 DIAGNOSIS — Z87891 Personal history of nicotine dependence: Secondary | ICD-10-CM | POA: Insufficient documentation

## 2018-04-04 DIAGNOSIS — Z7984 Long term (current) use of oral hypoglycemic drugs: Secondary | ICD-10-CM | POA: Insufficient documentation

## 2018-04-04 DIAGNOSIS — I25119 Atherosclerotic heart disease of native coronary artery with unspecified angina pectoris: Secondary | ICD-10-CM | POA: Diagnosis not present

## 2018-04-04 DIAGNOSIS — M069 Rheumatoid arthritis, unspecified: Secondary | ICD-10-CM | POA: Insufficient documentation

## 2018-04-04 DIAGNOSIS — I208 Other forms of angina pectoris: Secondary | ICD-10-CM | POA: Diagnosis present

## 2018-04-04 HISTORY — PX: LEFT HEART CATH AND CORONARY ANGIOGRAPHY: CATH118249

## 2018-04-04 LAB — GLUCOSE, CAPILLARY: Glucose-Capillary: 134 mg/dL — ABNORMAL HIGH (ref 70–99)

## 2018-04-04 SURGERY — LEFT HEART CATH AND CORONARY ANGIOGRAPHY
Anesthesia: Moderate Sedation | Laterality: Left

## 2018-04-04 MED ORDER — ACETAMINOPHEN 325 MG PO TABS: 650.0000 mg | ORAL_TABLET | ORAL | Status: DC | PRN

## 2018-04-04 MED ORDER — MIDAZOLAM HCL 2 MG/2ML IJ SOLN
INTRAMUSCULAR | Status: AC
  Filled 2018-04-04: qty 2

## 2018-04-04 MED ORDER — ASPIRIN 81 MG PO CHEW
81.0000 mg | CHEWABLE_TABLET | ORAL | Status: AC
  Administered 2018-04-04: 81 mg via ORAL

## 2018-04-04 MED ORDER — SODIUM CHLORIDE 0.9 % WEIGHT BASED INFUSION
3.0000 mL/kg/h | INTRAVENOUS | Status: AC
  Administered 2018-04-04: 3 mL/kg/h via INTRAVENOUS

## 2018-04-04 MED ORDER — ONDANSETRON HCL 4 MG/2ML IJ SOLN: 4.0000 mg | Freq: Four times a day (QID) | INTRAMUSCULAR | Status: DC | PRN

## 2018-04-04 MED ORDER — SODIUM CHLORIDE 0.9 % IV SOLN: 250.0000 mL | INTRAVENOUS | Status: DC | PRN

## 2018-04-04 MED ORDER — IOPAMIDOL (ISOVUE-300) INJECTION 61%
INTRAVENOUS | Status: DC | PRN
  Administered 2018-04-04: 105 mL via INTRAVENOUS

## 2018-04-04 MED ORDER — SODIUM CHLORIDE 0.9 % WEIGHT BASED INFUSION: 1.0000 mL/kg/h | INTRAVENOUS | Status: DC

## 2018-04-04 MED ORDER — SODIUM CHLORIDE 0.9% FLUSH: 3.0000 mL | Freq: Two times a day (BID) | INTRAVENOUS | Status: DC

## 2018-04-04 MED ORDER — MIDAZOLAM HCL 2 MG/2ML IJ SOLN
INTRAMUSCULAR | Status: DC | PRN
  Administered 2018-04-04: 1 mg via INTRAVENOUS

## 2018-04-04 MED ORDER — SODIUM CHLORIDE 0.9% FLUSH: 3.0000 mL | INTRAVENOUS | Status: DC | PRN

## 2018-04-04 MED ORDER — HEPARIN (PORCINE) IN NACL 1000-0.9 UT/500ML-% IV SOLN
INTRAVENOUS | Status: AC
  Filled 2018-04-04: qty 1000

## 2018-04-04 MED ORDER — ASPIRIN 81 MG PO CHEW
CHEWABLE_TABLET | ORAL | Status: AC
  Filled 2018-04-04: qty 1

## 2018-04-04 MED ORDER — FENTANYL CITRATE (PF) 100 MCG/2ML IJ SOLN
INTRAMUSCULAR | Status: AC
  Filled 2018-04-04: qty 2

## 2018-04-04 SURGICAL SUPPLY — 9 items
CATH INFINITI 5FR ANG PIGTAIL (CATHETERS) ×3 IMPLANT
CATH INFINITI 5FR JL4 (CATHETERS) ×3 IMPLANT
CATH INFINITI JR4 5F (CATHETERS) ×3 IMPLANT
DEVICE CLOSURE MYNXGRIP 5F (Vascular Products) ×3 IMPLANT
KIT MANI 3VAL PERCEP (MISCELLANEOUS) ×3 IMPLANT
NEEDLE PERC 18GX7CM (NEEDLE) ×3 IMPLANT
PACK CARDIAC CATH (CUSTOM PROCEDURE TRAY) ×3 IMPLANT
SHEATH AVANTI 5FR X 11CM (SHEATH) ×3 IMPLANT
WIRE GUIDERIGHT .035X150 (WIRE) ×3 IMPLANT

## 2018-04-04 NOTE — Progress Notes (Signed)
Patient clinically stable post heart cath per DR Nehemiah Massed. Vitals stable. Right groin without bleeding nor hematoma. Husband at bedside. Denies complaints. Sinus rhythm per monitor. Dr Nehemiah Massed out to speak with patient and husband regarding procedure with questions answered.

## 2018-04-04 NOTE — Discharge Instructions (Signed)
Moderate Conscious Sedation, Adult, Care After °These instructions provide you with information about caring for yourself after your procedure. Your health care provider may also give you more specific instructions. Your treatment has been planned according to current medical practices, but problems sometimes occur. Call your health care provider if you have any problems or questions after your procedure. °What can I expect after the procedure? °After your procedure, it is common: °· To feel sleepy for several hours. °· To feel clumsy and have poor balance for several hours. °· To have poor judgment for several hours. °· To vomit if you eat too soon. °Follow these instructions at home: °For at least 24 hours after the procedure: ° °· Do not: °? Participate in activities where you could fall or become injured. °? Drive. °? Use heavy machinery. °? Drink alcohol. °? Take sleeping pills or medicines that cause drowsiness. °? Make important decisions or sign legal documents. °? Take care of children on your own. °· Rest. °Eating and drinking °· Follow the diet recommended by your health care provider. °· If you vomit: °? Drink water, juice, or soup when you can drink without vomiting. °? Make sure you have little or no nausea before eating solid foods. °General instructions °· Have a responsible adult stay with you until you are awake and alert. °· Take over-the-counter and prescription medicines only as told by your health care provider. °· If you smoke, do not smoke without supervision. °· Keep all follow-up visits as told by your health care provider. This is important. °Contact a health care provider if: °· You keep feeling nauseous or you keep vomiting. °· You feel light-headed. °· You develop a rash. °· You have a fever. °Get help right away if: °· You have trouble breathing. °This information is not intended to replace advice given to you by your health care provider. Make sure you discuss any questions you have  with your health care provider. °Document Released: 11/26/2012 Document Revised: 07/11/2015 Document Reviewed: 05/28/2015 °Elsevier Interactive Patient Education © 2019 [employer] Inc[employer] °Femoral Site Care °This sheet gives you information about how to care for yourself after your procedure. Your health care provider may also give you more specific instructions. If you have problems or questions, contact your health care provider. °What can I expect after the procedure? °After the procedure, it is common to have: °· Bruising that usually fades within 1-2 weeks. °· Tenderness at the site. °Follow these instructions at home: °Wound care °· Follow instructions from your health care provider about how to take care of your insertion site. Make sure you: °? Wash your hands with soap and water before you change your bandage (dressing). If soap and water are not available, use hand sanitizer. °? Change your dressing as told by your health care provider. °? Leave stitches (sutures), skin glue, or adhesive strips in place. These skin closures may need to stay in place for 2 weeks or longer. If adhesive strip edges start to loosen and curl up, you may trim the loose edges. Do not remove adhesive strips completely unless your health care provider tells you to do that. °· Do not take baths, swim, or use a hot tub until your health care provider approves. °· You may shower 24-48 hours after the procedure or as told by your health care provider. °? Gently wash the site with plain soap and water. °? Pat the area dry with a clean towel. °? Do not rub the site. This may cause   bleeding. °· Do not apply powder or lotion to the site. Keep the site clean and dry. °· Check your femoral site every day for signs of infection. Check for: °? Redness, swelling, or pain. °? Fluid or blood. °? Warmth. °? Pus or a bad smell. °Activity °· For the first 2-3 days after your procedure, or as long as directed: °? Avoid climbing stairs as much as  possible. °? Do not squat. °· Do not lift anything that is heavier than 10 lb (4.5 kg), or the limit that you are told, until your health care provider says that it is safe. °· Rest as directed. °? Avoid sitting for a long time without moving. Get up to take short walks every 1-2 hours. °· Do not drive for 24 hours if you were given a medicine to help you relax (sedative). °General instructions °· Take over-the-counter and prescription medicines only as told by your health care provider. °· Keep all follow-up visits as told by your health care provider. This is important. °Contact a health care provider if you have: °· A fever or chills. °· You have redness, swelling, or pain around your insertion site. °Get help right away if: °· The catheter insertion area swells very fast. °· You pass out. °· You suddenly start to sweat or your skin gets clammy. °· The catheter insertion area is bleeding, and the bleeding does not stop when you hold steady pressure on the area. °· The area near or just beyond the catheter insertion site becomes pale, cool, tingly, or numb. °These symptoms may represent a serious problem that is an emergency. Do not wait to see if the symptoms will go away. Get medical help right away. Call your local emergency services (911 in the [place] Do not drive yourself to the hospital. °Summary °· After the procedure, it is common to have bruising that usually fades within 1-2 weeks. °· Check your femoral site every day for signs of infection. °· Do not lift anything that is heavier than 10 lb (4.5 kg), or the limit that you are told, until your health care provider says that it is safe. °This information is not intended to replace advice given to you by your health care provider. Make sure you discuss any questions you have with your health care provider. °Document Released: 10/09/2013 Document Revised: 02/18/2017 Document Reviewed: 02/18/2017 °Elsevier Interactive Patient Education © 2019 Elsevier  Inc. ° °

## 2018-04-21 DIAGNOSIS — I2510 Atherosclerotic heart disease of native coronary artery without angina pectoris: Secondary | ICD-10-CM | POA: Insufficient documentation

## 2018-05-02 ENCOUNTER — Other Ambulatory Visit
Admission: RE | Admit: 2018-05-02 | Discharge: 2018-05-02 | Disposition: A | Discharge status: Home / Self Care | Payer: Federal, State, Local not specified - PPO | Attending: Otolaryngology | Admitting: Otolaryngology

## 2018-05-02 DIAGNOSIS — E890 Postprocedural hypothyroidism: Secondary | ICD-10-CM | POA: Insufficient documentation

## 2018-05-02 LAB — T4, FREE: FREE T4: 0.97 ng/dL (ref 0.82–1.77)

## 2018-05-02 LAB — TSH: TSH: 0.137 u[IU]/mL — ABNORMAL LOW (ref 0.350–4.500)

## 2018-05-03 LAB — T3, FREE: T3, Free: 2.9 pg/mL (ref 2.0–4.4)

## 2018-11-04 DIAGNOSIS — S46111S Strain of muscle, fascia and tendon of long head of biceps, right arm, sequela: Secondary | ICD-10-CM | POA: Insufficient documentation

## 2018-11-20 HISTORY — PX: SHOULDER SURGERY: SHX246

## 2018-12-01 ENCOUNTER — Encounter: Payer: Self-pay | Admitting: Physical Therapy

## 2018-12-01 ENCOUNTER — Ambulatory Visit: Payer: Medicare Other | Attending: Orthopedic Surgery | Admitting: Physical Therapy

## 2018-12-01 ENCOUNTER — Other Ambulatory Visit: Payer: Self-pay

## 2018-12-01 DIAGNOSIS — M6281 Muscle weakness (generalized): Secondary | ICD-10-CM | POA: Diagnosis present

## 2018-12-01 DIAGNOSIS — M19011 Primary osteoarthritis, right shoulder: Secondary | ICD-10-CM | POA: Insufficient documentation

## 2018-12-01 DIAGNOSIS — R293 Abnormal posture: Secondary | ICD-10-CM | POA: Insufficient documentation

## 2018-12-01 DIAGNOSIS — Z9889 Other specified postprocedural states: Secondary | ICD-10-CM

## 2018-12-01 DIAGNOSIS — M2560 Stiffness of unspecified joint, not elsewhere classified: Secondary | ICD-10-CM | POA: Insufficient documentation

## 2018-12-01 DIAGNOSIS — M25511 Pain in right shoulder: Secondary | ICD-10-CM | POA: Diagnosis present

## 2018-12-01 NOTE — Therapy (Signed)
[facility] [address] [place], [place], [address] Phone: [phone]   Fax:  [phone]  Physical Therapy Evaluation  Patient Details  Name: Joanna Ortiz MRN: [id number] Date of Birth: [birth year] Referring Provider (PT): Dr. Lennox Solders, MD   Encounter Date: 12/01/2018  Treatment: 1 of 16.  Recert date: [id number] 1100 to 1209    Past Medical History:  Diagnosis Date  . Anemia   . Arthritis   . Collagen vascular disease ([place])   . Coronary artery disease   . Crohn disease ([place])   . Diabetes mellitus without complication (HCC)    diet controlled  . Hyperlipidemia   . Hypothyroidism   . Pneumonia    3/18  . Thyroid disease    goiter    Past Surgical History:  Procedure Laterality Date  . ABDOMINAL HYSTERECTOMY  1987  . APPENDECTOMY    . BREAST BIOPSY Bilateral    neg  . CATARACT EXTRACTION W/PHACO Right 11/06/2016   Procedure: CATARACT EXTRACTION PHACO AND INTRAOCULAR LENS PLACEMENT (IOC);  Surgeon: Birder Robson, MD;  Location: [facility] ORS;  Service: Ophthalmology;  Laterality: Right;  [place] 00:42.6 AP% 16.7 CDE 7.14 Fluid Pack Lot # [id number]  . CATARACT EXTRACTION W/PHACO Left 12/25/2016   Procedure: CATARACT EXTRACTION PHACO AND INTRAOCULAR LENS PLACEMENT (IOC);  Surgeon: Birder Robson, MD;  Location: [facility] ORS;  Service: Ophthalmology;  Laterality: Left;  [place] 00:32 AP% 13.7 CDE 4.40 Fluid pack lot # [id number]  . COLON SURGERY  06/06/2011   Resection of terminal ileum and previous ileocolonic anastomosis for recurrent Crohn's disease. 3 focal strictures identified.  . COLONOSCOPY  2013  . JOINT REPLACEMENT Left    SHOULDER  . LEFT HEART CATH AND CORONARY ANGIOGRAPHY Left 04/04/2018   Procedure: LEFT HEART CATH AND CORONARY ANGIOGRAPHY;  Surgeon: Corey Skains, MD;  Location: [place] [facility];  Service: Cardiovascular;  Laterality: Left;  . SHOULDER SURGERY Right 11/20/2018   Procedure: R  SHOULDER ARTHROSCOPY: biceps tenomyotomy, distal claviculectomy, acromioplasty, posterior capsule release, debridement, decrompression of subacromial space, coracoacromial release, bursectomy  . SMALL INTESTINE SURGERY  2012  . THYROID SURGERY  1992  . TONSILLECTOMY      There were no vitals filed for this visit.    Pt presents to PT 1.5 weeks post R shoulder arthroscopic procedure (see pertinent hx). Pt reports 4/10 current pain in R shoulder. Pt reports she dressed herself this morning for the first time since her procedure. Pt reports taking tylenol this morning and again prior to therapy. Pt reports avoiding taking prescribed pain medications due to nausea, but will take as necessary for pain.     SUBJECTIVE  Chief complaint: s/p R shoulder arthroscopy Onset: 11/20/2018 Shoulder trauma: No Pain quality: constant ache Pain: 4/10 Present, 2/10 Best, 7/10 Worst: Aggravating factors: using arm, reaching across body Easing factors: ice, rest in sling, meds 24 hour pain behavior: worse in morning before pain meds Radiating pain: None Numbness/Tingling: None Prior history of shoulder injury or pain: Previous L TSA Prior history of therapy for shoulder: Yes on L TSA and cervical region (hx of cervical pain) Follow-up appointment with MD: 01/09/2019   OBJECTIVE  MUSCULOSKELETAL: Tremor: Normal Bulk: Normal Tone: Normal  Strength Deferred on this date  AROM R: unable to test 2/2 pain L: 128 Shoulder flexion 143 Shoulder abduction 25 Shoulder external rotation 45 Shoulder internal rotation 50 Shoulder extension *Indicates Pain  PROM L PROM = L AROM R: 95* Shoulder flexion  85* Shoulder abduction 20* Shoulder external rotation Shoulder internal rotation not tested Shoulder extension not tested *Indicates Pain  NEUROLOGICAL:  Mental Status Patient is oriented to person, place and time.  Recent memory is intact.  Remote memory is intact.  Attention span and  concentration are intact.  Expressive speech is intact.  Patient's fund of knowledge is within normal limits for educational level.  SENSATION: Light touch sensation, proprioception and hot/cold testing deferred on this date  REFLEXES: Deferred on this date  COORDINATION/CEREBELLAR: Deferred on this date  SPECIAL TESTS N/a for this visit    Objective measurements completed on examination: See above findings.    See HEP    PT Short Term Goals - 12/01/18 1425      PT SHORT TERM GOAL #1   Title  Pt will demonstrate >100 degrees of PROM flexion/ abduction of R UE to demonstrate increased ROM with decreased guarding.    Baseline  R UE PROM flexion: 95, abduction: 85 with significant painful guarding 12/01/2018    Time  4    Period  Weeks    Status  New    Target Date  12/29/18        PT Long Term Goals - 12/01/18 1415      PT LONG TERM GOAL #1   Title  Pt will increase FOTO score to 56 to demonstrate increased functional mobility.    Baseline  FOTO score 32 12/01/2018    Time  8    Period  Weeks    Status  New    Target Date  01/26/19      PT LONG TERM GOAL #2   Title  Pt will demonstrate R UE A/AROM to [facility] as compared to L UE to demonstrate increased UE mobility.    Baseline  R UE PROM grossly limited 2/2 pain (flexion: 95, abd: 85, ER: 20) 12/01/2018    Time  8    Period  Weeks    Status  New    Target Date  01/26/19      PT LONG TERM GOAL #3   Title  Pt will report <4/10 R shoulder pain at worst with movement to indicate decreased pain with activity.    Baseline  7/10 worst R shoulder pain with R UE movement 12/01/2018    Time  8    Period  Weeks    Status  New    Target Date  01/26/19      PT LONG TERM GOAL #4   Title  Pt will be able to reach across body without pain for bathing and dressing.    Baseline  Horizontal abduction limited and significantly painful 12/01/2018    Time  8    Period  Weeks    Status  New    Target Date  01/26/19        Pt is a pleasant 73 y.o. female presenting to therapy s/p R shoulder arthroscopic bicep tenomyotomy, debridement, and subacromial decompression. Minimal swelling noted with good incision healing noted. Pt R UE PROM grossly limited 2/2 pain (flexion: 95, abduction: 85, ER: 20); demonstrates frequent guarding and pain 2/2 guarding muscular contractions. Pt AROM and MMT were deferred 2/2 pain and s/p surgery. Pt will benefit from skilled therapy to increase R UE ROM and decrease pain with ADLs.        Patient will benefit from skilled therapeutic intervention in order to improve the following deficits and impairments:  Improper body mechanics, Pain, Postural dysfunction,  Decreased mobility, Decreased activity tolerance, Decreased endurance, Decreased range of motion, Decreased strength, Hypomobility, Impaired UE functional use, Impaired flexibility  Visit Diagnosis: S/P right shoulder surgery  Arthritis of right shoulder region  Joint stiffness  Muscle weakness (generalized)  Abnormal posture  Acute pain of right shoulder     Problem List Patient Active Problem List   Diagnosis Date Noted  . Stable angina ([place]) 04/03/2018  . Right groin pain 06/22/2016  . Acute bronchitis 05/01/2016  . Diastasis recti 10/29/2015  . Rectal bleeding 06/10/2014  . AP (abdominal pain) 12/29/2013  . Crohn's disease ([place]) 12/18/2013   Pura Spice, PT, DPT # [id number] Chinita Greenland, SPT 12/03/2018, 7:50 AM  [facility] [address] [place], [place], [address] Phone: [phone]   Fax:  [phone]  Name: Joanna Ortiz MRN: [id number] Date of Birth: 03/26/1945

## 2018-12-03 ENCOUNTER — Ambulatory Visit: Payer: Medicare Other | Admitting: Physical Therapy

## 2018-12-03 ENCOUNTER — Other Ambulatory Visit: Payer: Self-pay

## 2018-12-03 ENCOUNTER — Encounter: Payer: Self-pay | Admitting: Physical Therapy

## 2018-12-03 DIAGNOSIS — M6281 Muscle weakness (generalized): Secondary | ICD-10-CM

## 2018-12-03 DIAGNOSIS — R293 Abnormal posture: Secondary | ICD-10-CM

## 2018-12-03 DIAGNOSIS — Z9889 Other specified postprocedural states: Secondary | ICD-10-CM

## 2018-12-03 DIAGNOSIS — M2560 Stiffness of unspecified joint, not elsewhere classified: Secondary | ICD-10-CM

## 2018-12-03 DIAGNOSIS — M19011 Primary osteoarthritis, right shoulder: Secondary | ICD-10-CM

## 2018-12-03 DIAGNOSIS — M25511 Pain in right shoulder: Secondary | ICD-10-CM

## 2018-12-03 NOTE — Therapy (Signed)
[facility] [address] [place], [place], [address] Phone: [phone]   Fax:  [phone]  Physical Therapy Treatment  Patient Details  Name: Joanna Ortiz MRN: [id number] Date of Birth: 1945/08/21 Referring Provider (PT): Dr. Lennox Solders, MD   Encounter Date: 12/03/2018  Treatment: 2 of 16.  Recert date: [id number] 0945 to 82    Past Medical History:  Diagnosis Date  . Anemia   . Arthritis   . Collagen vascular disease ([place])   . Coronary artery disease   . Crohn disease ([place])   . Diabetes mellitus without complication (HCC)    diet controlled  . Hyperlipidemia   . Hypothyroidism   . Pneumonia    3/18  . Thyroid disease    goiter    Past Surgical History:  Procedure Laterality Date  . ABDOMINAL HYSTERECTOMY  1987  . APPENDECTOMY    . BREAST BIOPSY Bilateral    neg  . CATARACT EXTRACTION W/PHACO Right 11/06/2016   Procedure: CATARACT EXTRACTION PHACO AND INTRAOCULAR LENS PLACEMENT (IOC);  Surgeon: Birder Robson, MD;  Location: [facility] ORS;  Service: Ophthalmology;  Laterality: Right;  [place] 00:42.6 AP% 16.7 CDE 7.14 Fluid Pack Lot # [id number]  . CATARACT EXTRACTION W/PHACO Left 12/25/2016   Procedure: CATARACT EXTRACTION PHACO AND INTRAOCULAR LENS PLACEMENT (IOC);  Surgeon: Birder Robson, MD;  Location: [facility] ORS;  Service: Ophthalmology;  Laterality: Left;  [place] 00:32 AP% 13.7 CDE 4.40 Fluid pack lot # [id number]  . COLON SURGERY  06/06/2011   Resection of terminal ileum and previous ileocolonic anastomosis for recurrent Crohn's disease. 3 focal strictures identified.  . COLONOSCOPY  2013  . JOINT REPLACEMENT Left    SHOULDER  . LEFT HEART CATH AND CORONARY ANGIOGRAPHY Left 04/04/2018   Procedure: LEFT HEART CATH AND CORONARY ANGIOGRAPHY;  Surgeon: Corey Skains, MD;  Location: [place] [facility];  Service: Cardiovascular;  Laterality: Left;  . SHOULDER SURGERY Right 11/20/2018   Procedure: R SHOULDER  ARTHROSCOPY: biceps tenomyotomy, distal claviculectomy, acromioplasty, posterior capsule release, debridement, decrompression of subacromial space, coracoacromial release, bursectomy  . SMALL INTESTINE SURGERY  2012  . THYROID SURGERY  1992  . TONSILLECTOMY      There were no vitals filed for this visit.    Pt reports doing HEP and pulleys at home. Pt is feeling nauseous this morning from pain medications; took zofran ~15 minutes prior to therapy.      Therapeutic Exercise: AAROM with pulley abduction and flexion - 2x10 each (abduction to ~85, flexion to ~90; with min assist from PT for elbow extension)  Manual Therapy: PROM: abduction, flexion, ER - 6x30 sec each with oscillations throughout range to aid with relaxation  Abduction ~90  Flexion ~95  ER ~20 degrees 10 minutes of cryotherapy to R shoulder in supine with pillow support        PT Short Term Goals - 12/01/18 1425      PT SHORT TERM GOAL #1   Title  Pt will demonstrate >100 degrees of PROM flexion/ abduction of R UE to demonstrate increased ROM with decreased guarding.    Baseline  R UE PROM flexion: 95, abduction: 85 with significant painful guarding 12/01/2018    Time  4    Period  Weeks    Status  New    Target Date  12/29/18        PT Long Term Goals - 12/01/18 1415      PT LONG TERM GOAL #1  Title  Pt will increase FOTO score to 56 to demonstrate increased functional mobility.    Baseline  FOTO score 32 12/01/2018    Time  8    Period  Weeks    Status  New    Target Date  01/26/19      PT LONG TERM GOAL #2   Title  Pt will demonstrate R UE A/AROM to [facility] as compared to L UE to demonstrate increased UE mobility.    Baseline  R UE PROM grossly limited 2/2 pain (flexion: 95, abd: 85, ER: 20) 12/01/2018    Time  8    Period  Weeks    Status  New    Target Date  01/26/19      PT LONG TERM GOAL #3   Title  Pt will report <4/10 R shoulder pain at worst with movement to indicate decreased pain  with activity.    Baseline  7/10 worst R shoulder pain with R UE movement 12/01/2018    Time  8    Period  Weeks    Status  New    Target Date  01/26/19      PT LONG TERM GOAL #4   Title  Pt will be able to reach across body without pain for bathing and dressing.    Baseline  Horizontal abduction limited and significantly painful 12/01/2018    Time  8    Period  Weeks    Status  New    Target Date  01/26/19            Plan - 12/04/18 1819    Clinical Impression Statement  Pt AAROM abduction increased to ~85 degrees, flexion to ~90 degrees.  PROM abduction has increased by 5 degrees (90), flexion and ER have not improved.  Pt is pain-limited and tense, especially with eccentric movements.  Pt will benefit from further skilled therapy to improve R UE ROM and decrease pain with ADLs.    Personal Factors and Comorbidities  Age;Comorbidity 3+    Comorbidities  CAD, hyperlipidemia, DM2, Crohn's disease    Examination-Activity Limitations  Bed Mobility;Carry;Bathing;Dressing;Hygiene/Grooming;Lift;Reach Overhead    Stability/Clinical Decision Making  Evolving/Moderate complexity    Clinical Decision Making  Moderate    Rehab Potential  Good    PT Frequency  2x / week    PT Duration  8 weeks    PT Treatment/Interventions  Moist Heat;Cryotherapy;Electrical Stimulation;Functional mobility training;Therapeutic activities;Therapeutic exercise;Manual techniques;Patient/family education;Passive range of motion;ADLs/Self Care Home Management;Ultrasound;Neuromuscular re-education    PT Next Visit Plan  Progress PROM, AAROM, scapular strengthening    PT Home Exercise Plan  see HEP    Consulted and Agree with Plan of Care  Patient       Patient will benefit from skilled therapeutic intervention in order to improve the following deficits and impairments:  Improper body mechanics, Pain, Postural dysfunction, Decreased mobility, Decreased activity tolerance, Decreased endurance, Decreased range of  motion, Decreased strength, Hypomobility, Impaired UE functional use, Impaired flexibility  Visit Diagnosis: S/P right shoulder surgery  Arthritis of right shoulder region  Joint stiffness  Muscle weakness (generalized)  Abnormal posture  Acute pain of right shoulder     Problem List Patient Active Problem List   Diagnosis Date Noted  . Stable angina ([place]) 04/03/2018  . Right groin pain 06/22/2016  . Acute bronchitis 05/01/2016  . Diastasis recti 10/29/2015  . Rectal bleeding 06/10/2014  . AP (abdominal pain) 12/29/2013  . Crohn's disease ([place]) 12/18/2013   Legrand Como  Vernona Rieger, PT, DPT # [id number] Chinita Greenland, SPT 12/05/2018, 6:38 PM  [place] [facility] [address] [place], [place], [address] Phone: [phone]   Fax:  [phone]  Name: Joanna Ortiz MRN: [id number] Date of Birth: 11-Dec-1945

## 2018-12-08 ENCOUNTER — Ambulatory Visit: Payer: Medicare Other | Admitting: Physical Therapy

## 2018-12-08 ENCOUNTER — Other Ambulatory Visit: Payer: Self-pay

## 2018-12-08 DIAGNOSIS — M2560 Stiffness of unspecified joint, not elsewhere classified: Secondary | ICD-10-CM

## 2018-12-08 DIAGNOSIS — M19011 Primary osteoarthritis, right shoulder: Secondary | ICD-10-CM

## 2018-12-08 DIAGNOSIS — Z9889 Other specified postprocedural states: Secondary | ICD-10-CM | POA: Diagnosis not present

## 2018-12-08 DIAGNOSIS — M25511 Pain in right shoulder: Secondary | ICD-10-CM

## 2018-12-08 DIAGNOSIS — M6281 Muscle weakness (generalized): Secondary | ICD-10-CM

## 2018-12-08 DIAGNOSIS — R293 Abnormal posture: Secondary | ICD-10-CM

## 2018-12-10 ENCOUNTER — Encounter: Payer: Self-pay | Admitting: Physical Therapy

## 2018-12-10 ENCOUNTER — Ambulatory Visit: Payer: Medicare Other | Admitting: Physical Therapy

## 2018-12-10 ENCOUNTER — Other Ambulatory Visit: Payer: Self-pay

## 2018-12-10 DIAGNOSIS — M25511 Pain in right shoulder: Secondary | ICD-10-CM

## 2018-12-10 DIAGNOSIS — M19011 Primary osteoarthritis, right shoulder: Secondary | ICD-10-CM

## 2018-12-10 DIAGNOSIS — M6281 Muscle weakness (generalized): Secondary | ICD-10-CM

## 2018-12-10 DIAGNOSIS — R293 Abnormal posture: Secondary | ICD-10-CM

## 2018-12-10 DIAGNOSIS — M2560 Stiffness of unspecified joint, not elsewhere classified: Secondary | ICD-10-CM

## 2018-12-10 DIAGNOSIS — Z9889 Other specified postprocedural states: Secondary | ICD-10-CM | POA: Diagnosis not present

## 2018-12-10 NOTE — Therapy (Signed)
[facility] [address] [place], [place], [address] Phone: [phone]   Fax:  [phone]  Physical Therapy Treatment  Patient Details  Name: Joanna Ortiz MRN: [id number] Date of Birth: Sep 12, 1945 Referring Provider (PT): Dr. Lennox Solders, MD   Encounter Date: 12/10/2018  PT End of Session - 12/16/18 0737    Visit Number  4    Number of Visits  16    Date for PT Re-Evaluation  01/26/19    Authorization - Visit Number  4    Authorization - Number of Visits  10    PT Start Time  1114    PT Stop Time  1210    PT Time Calculation (min)  56 min    Activity Tolerance  Patient tolerated treatment well;Patient limited by pain    Behavior During Therapy  [facility] for tasks assessed/performed       Past Medical History:  Diagnosis Date  . Anemia   . Arthritis   . Collagen vascular disease ([place])   . Coronary artery disease   . Crohn disease ([place])   . Diabetes mellitus without complication (HCC)    diet controlled  . Hyperlipidemia   . Hypothyroidism   . Pneumonia    3/18  . Thyroid disease    goiter    Past Surgical History:  Procedure Laterality Date  . ABDOMINAL HYSTERECTOMY  1987  . APPENDECTOMY    . BREAST BIOPSY Bilateral    neg  . CATARACT EXTRACTION W/PHACO Right 11/06/2016   Procedure: CATARACT EXTRACTION PHACO AND INTRAOCULAR LENS PLACEMENT (IOC);  Surgeon: Birder Robson, MD;  Location: [facility];  Service: Ophthalmology;  Laterality: Right;  [place] 00:42.6 AP% 16.7 CDE 7.14 Fluid Pack Lot # [id number]  . CATARACT EXTRACTION W/PHACO Left 12/25/2016   Procedure: CATARACT EXTRACTION PHACO AND INTRAOCULAR LENS PLACEMENT (IOC);  Surgeon: Birder Robson, MD;  Location: [facility] OR[facility];  Service: Ophthalmology;  Laterality: Left;  [place] 00:32 AP% 13.7 CDE 4.40 Fluid pack lot # [id number]  . COLON SURGERY  06/06/2011   Resection of terminal ileum and previous ileocolonic anastomosis for recurrent Crohn's disease. 3 focal  strictures identified.  . COLONOSCOPY  2013  . JOINT REPLACEMENT Left    SHOULDER  . LEFT HEART CATH AND CORONARY ANGIOGRAPHY Left 04/04/2018   Procedure: LEFT HEART CATH AND CORONARY ANGIOGRAPHY;  Surgeon: Corey Skains, MD;  Location: [place] [facility];  Service: Cardiovascular;  Laterality: Left;  . SHOULDER SURGERY Right 11/20/2018   Procedure: R SHOULDER ARTHROSCOPY: biceps tenomyotomy, distal claviculectomy, acromioplasty, posterior capsule release, debridement, decrompression of subacromial space, coracoacromial release, bursectomy  . SMALL INTESTINE SURGERY  2012  . THYROID SURGERY  1992  . TONSILLECTOMY      There were no vitals filed for this visit.   Pt took Zofran prior to PT tx session; entered PT without use of sling with guarded R UE posture. Pt reports 4/10 pain in R shoulder. Pt reports performing HEP several times a day.      Therapeutic Exercise: AAROM pulleys: flexion, abduction - 2x20 Supine serratus punches - 2x10 Supine R shoulder active ER/IR/supination/ pronation/ bicep curls 20x.   Reviewed HEP   Manual Therapy: R shoulder PROM: flexion, abduction, ER, IR - x 15 minutes with gentle oscillations throughout range to promote relaxation; pt very guarded and with increased guarding/ pain with eccentric motions  R shoulder AP/ PA - pt guarded 2/2 pain.  Pt. Able to relax after several sets  STM to R shoulder traps, deltoids, RC, biceps    PT Short Term Goals - 12/01/18 1425      PT SHORT TERM GOAL #1   Title  Pt will demonstrate >100 degrees of PROM flexion/ abduction of R UE to demonstrate increased ROM with decreased guarding.    Baseline  R UE PROM flexion: 95, abduction: 85 with significant painful guarding 12/01/2018    Time  4    Period  Weeks    Status  New    Target Date  12/29/18        PT Long Term Goals - 12/01/18 1415      PT LONG TERM GOAL #1   Title  Pt will increase FOTO score to 56 to demonstrate increased functional  mobility.    Baseline  FOTO score 32 12/01/2018    Time  8    Period  Weeks    Status  New    Target Date  01/26/19      PT LONG TERM GOAL #2   Title  Pt will demonstrate R UE A/AROM to [facility] as compared to L UE to demonstrate increased UE mobility.    Baseline  R UE PROM grossly limited 2/2 pain (flexion: 95, abd: 85, ER: 20) 12/01/2018    Time  8    Period  Weeks    Status  New    Target Date  01/26/19      PT LONG TERM GOAL #3   Title  Pt will report <4/10 R shoulder pain at worst with movement to indicate decreased pain with activity.    Baseline  7/10 worst R shoulder pain with R UE movement 12/01/2018    Time  8    Period  Weeks    Status  New    Target Date  01/26/19      PT LONG TERM GOAL #4   Title  Pt will be able to reach across body without pain for bathing and dressing.    Baseline  Horizontal abduction limited and significantly painful 12/01/2018    Time  8    Period  Weeks    Status  New    Target Date  01/26/19        PT focused on increasing shoulder flexion/ abduction AA/PROM.  Pt. remains guarded/ pain limited with manual tx. and increases ROM as tx. progresses.  PT reenforced importance of daily HEP/ stretches to increase functional ROM.  Pt. will continue to benefit from skilled PT to increase shoulder ROM/ strengthening.       Patient will benefit from skilled therapeutic intervention in order to improve the following deficits and impairments:  Improper body mechanics, Pain, Postural dysfunction, Decreased mobility, Decreased activity tolerance, Decreased endurance, Decreased range of motion, Decreased strength, Hypomobility, Impaired UE functional use, Impaired flexibility  Visit Diagnosis: S/P right shoulder surgery  Arthritis of right shoulder region  Joint stiffness  Muscle weakness (generalized)  Abnormal posture  Acute pain of right shoulder     Problem List Patient Active Problem List   Diagnosis Date Noted  . Stable angina ([place])  04/03/2018  . Right groin pain 06/22/2016  . Acute bronchitis 05/01/2016  . Diastasis recti 10/29/2015  . Rectal bleeding 06/10/2014  . AP (abdominal pain) 12/29/2013  . Crohn's disease ([place]) 12/18/2013   Pura Spice, PT, DPT # [id number] Chinita Greenland, SPT 12/16/2018, 7:46 AM  [facility] [address] [place], [place], [address]  Phone: [phone]   Fax:  [phone]  Name: Joanna Ortiz MRN: [id number] Date of Birth: Jul 15, 1945

## 2018-12-10 NOTE — Therapy (Addendum)
[facility] [address] [place], [place], [address] Phone: [phone]   Fax:  [phone]  Physical Therapy Treatment  Patient Details  Name: Joanna Ortiz MRN: [id number] Date of Birth: 1945-10-10 Referring Provider (PT): Dr. Lennox Solders, MD   Encounter Date: 12/08/2018  PT End of Session - 12/10/18 0758    Visit Number  3    Number of Visits  16    Date for PT Re-Evaluation  01/26/19    Authorization - Visit Number  3    Authorization - Number of Visits  10    PT Start Time  1111    PT Stop Time  1205    PT Time Calculation (min)  54 min    Activity Tolerance  Patient tolerated treatment well;Patient limited by pain    Behavior During Therapy  [facility] for tasks assessed/performed       Past Medical History:  Diagnosis Date  . Anemia   . Arthritis   . Collagen vascular disease ([place])   . Coronary artery disease   . Crohn disease ([place])   . Diabetes mellitus without complication (HCC)    diet controlled  . Hyperlipidemia   . Hypothyroidism   . Pneumonia    3/18  . Thyroid disease    goiter    Past Surgical History:  Procedure Laterality Date  . ABDOMINAL HYSTERECTOMY  1987  . APPENDECTOMY    . BREAST BIOPSY Bilateral    neg  . CATARACT EXTRACTION W/PHACO Right 11/06/2016   Procedure: CATARACT EXTRACTION PHACO AND INTRAOCULAR LENS PLACEMENT (IOC);  Surgeon: Birder Robson, MD;  Location: [facility];  Service: Ophthalmology;  Laterality: Right;  [place] 00:42.6 AP% 16.7 CDE 7.14 Fluid Pack Lot # [id number]  . CATARACT EXTRACTION W/PHACO Left 12/25/2016   Procedure: CATARACT EXTRACTION PHACO AND INTRAOCULAR LENS PLACEMENT (IOC);  Surgeon: Birder Robson, MD;  Location: [facility];  Service: Ophthalmology;  Laterality: Left;  [place] 00:32 AP% 13.7 CDE 4.40 Fluid pack lot # [id number]  . COLON SURGERY  06/06/2011   Resection of terminal ileum and previous ileocolonic anastomosis for recurrent Crohn's disease. 3 focal  strictures identified.  . COLONOSCOPY  2013  . JOINT REPLACEMENT Left    SHOULDER  . LEFT HEART CATH AND CORONARY ANGIOGRAPHY Left 04/04/2018   Procedure: LEFT HEART CATH AND CORONARY ANGIOGRAPHY;  Surgeon: Corey Skains, MD;  Location: [place] [facility];  Service: Cardiovascular;  Laterality: Left;  . SHOULDER SURGERY Right 11/20/2018   Procedure: R SHOULDER ARTHROSCOPY: biceps tenomyotomy, distal claviculectomy, acromioplasty, posterior capsule release, debridement, decrompression of subacromial space, coracoacromial release, bursectomy  . SMALL INTESTINE SURGERY  2012  . THYROID SURGERY  1992  . TONSILLECTOMY      There were no vitals filed for this visit.  Subjective Assessment - 12/10/18 0751    Subjective  Pt. took Zofran prior to PT tx. session because pain is making her nauseous.  Pt. entered PT without use of sling and states she is doing HEP at least 2x/day.    Pertinent History  Pt hx of progressive R shoulder pain and stiffness.  Received steroid injections in June that provided significant pain relief.  However, after a visit with her chiropractor for some left-sided neck pain in July, she reported the return of R shoulder pain with increased "catching" and dull aching (no trauma reported).  Pt was dx with a labral tear of the long head of biceps tendon of the R UE.  Pt is presenting to therapy s/p R shoulder arthroscopy on 11/20/2018 (biceps tenomyotomy, debridement, subacromial decompression, capsule release, bursectomy).  Pt c/c L shoulder constant, aching pain (best: 2/10 with ice, rest and meds; worst: 7/10 with using arm, reaching across body; worse in the morning).  Pt hx of L TSA several years ago, R UE arthritis, CAD, hyperlipidemia, DM2, Crohn's disease.    Limitations  House hold activities;Writing;Lifting    Patient Stated Goals  Increase L UE ROM and decrease pain with ADLs    Currently in Pain?  Yes    Pain Score  3     Pain Location  Shoulder    Pain  Orientation  Right    Pain Descriptors / Indicators  Aching;Constant         Therapeutic Exercise:  Seated R sh.AAROM with pulley abduction and flexion - 20x2 each (abduction to ~85, flexion to ~90; with min assist from PT for elbow extension).  Pt. Unable to progress flexion/abd. Due to pain/joint stiffness.  Supine wand ex. (flexion/ chest press)- 20x Standing wall ladder: flexion 5x (marked with sticker) B UBE 2 min. (assist from PT required).   Manual Therapy:  Supine R sh. PROM: abduction, flexion, ER - 5x30 sec each with oscillations throughout range to aid with relaxation             Abduction ~90             Flexion ~95             ER ~20 degrees Supine R sh. AP/PA grade II-III Mobs. 3x20 sec. (guarded)  10 minutes of cryotherapy to R shoulder in supine with pillow support     PT Short Term Goals - 12/01/18 1425      PT SHORT TERM GOAL #1   Title  Pt will demonstrate >100 degrees of PROM flexion/ abduction of R UE to demonstrate increased ROM with decreased guarding.    Baseline  R UE PROM flexion: 95, abduction: 85 with significant painful guarding 12/01/2018    Time  4    Period  Weeks    Status  New    Target Date  12/29/18        PT Long Term Goals - 12/01/18 1415      PT LONG TERM GOAL #1   Title  Pt will increase FOTO score to 56 to demonstrate increased functional mobility.    Baseline  FOTO score 32 12/01/2018    Time  8    Period  Weeks    Status  New    Target Date  01/26/19      PT LONG TERM GOAL #2   Title  Pt will demonstrate R UE A/AROM to [facility] as compared to L UE to demonstrate increased UE mobility.    Baseline  R UE PROM grossly limited 2/2 pain (flexion: 95, abd: 85, ER: 20) 12/01/2018    Time  8    Period  Weeks    Status  New    Target Date  01/26/19      PT LONG TERM GOAL #3   Title  Pt will report <4/10 R shoulder pain at worst with movement to indicate decreased pain with activity.    Baseline  7/10 worst R shoulder pain with  R UE movement 12/01/2018    Time  8    Period  Weeks    Status  New    Target Date  01/26/19  PT LONG TERM GOAL #4   Title  Pt will be able to reach across body without pain for bathing and dressing.    Baseline  Horizontal abduction limited and significantly painful 12/01/2018    Time  8    Period  Weeks    Status  New    Target Date  01/26/19         Plan - 12/10/18 0759    Clinical Impression Statement  R shoulder remains pain limited with significant joint hypomobility with R shoulder flexion at 90 deg. (AA/PROM).  PT attempted to increase R shoulder >90 deg. with significant guarding/ resistance/ hypomobility noted.  Pt. has difficulty with AP/PA joint mobs. to R shoulder in supine position.  No tenderness with palpation over deltoid.    Personal Factors and Comorbidities  Age;Comorbidity 3+    Comorbidities  CAD, hyperlipidemia, DM2, Crohn's disease    Examination-Activity Limitations  Bed Mobility;Carry;Bathing;Dressing;Hygiene/Grooming;Lift;Reach Overhead    Stability/Clinical Decision Making  Evolving/Moderate complexity    Clinical Decision Making  Moderate    Rehab Potential  Good    PT Frequency  2x / week    PT Duration  8 weeks    PT Treatment/Interventions  Moist Heat;Cryotherapy;Electrical Stimulation;Functional mobility training;Therapeutic activities;Therapeutic exercise;Manual techniques;Patient/family education;Passive range of motion;ADLs/Self Care Home Management;Ultrasound;Neuromuscular re-education    PT Next Visit Plan  Progress PROM, AAROM, scapular strengthening    PT Home Exercise Plan  see HEP    Consulted and Agree with Plan of Care  Patient       Patient will benefit from skilled therapeutic intervention in order to improve the following deficits and impairments:  Improper body mechanics, Pain, Postural dysfunction, Decreased mobility, Decreased activity tolerance, Decreased endurance, Decreased range of motion, Decreased strength, Hypomobility,  Impaired UE functional use, Impaired flexibility  Visit Diagnosis: S/P right shoulder surgery  Arthritis of right shoulder region  Joint stiffness  Muscle weakness (generalized)  Abnormal posture  Acute pain of right shoulder     Problem List Patient Active Problem List   Diagnosis Date Noted  . Stable angina ([place]) 04/03/2018  . Right groin pain 06/22/2016  . Acute bronchitis 05/01/2016  . Diastasis recti 10/29/2015  . Rectal bleeding 06/10/2014  . AP (abdominal pain) 12/29/2013  . Crohn's disease ([place]) 12/18/2013   Pura Spice, PT, DPT # [phone] 12/10/2018, 8:15 AM  [facility] [address] [place], [place], [address] Phone: [phone]   Fax:  [phone]  Name: KATIA SHANKAR MRN: [id number] Date of Birth: 12-Mar-1945

## 2018-12-15 ENCOUNTER — Other Ambulatory Visit: Payer: Self-pay

## 2018-12-15 ENCOUNTER — Ambulatory Visit: Payer: Medicare Other

## 2018-12-15 ENCOUNTER — Encounter: Payer: Self-pay | Admitting: Physical Therapy

## 2018-12-15 DIAGNOSIS — R293 Abnormal posture: Secondary | ICD-10-CM

## 2018-12-15 DIAGNOSIS — Z9889 Other specified postprocedural states: Secondary | ICD-10-CM | POA: Diagnosis not present

## 2018-12-15 DIAGNOSIS — M25511 Pain in right shoulder: Secondary | ICD-10-CM

## 2018-12-15 DIAGNOSIS — M2560 Stiffness of unspecified joint, not elsewhere classified: Secondary | ICD-10-CM

## 2018-12-15 DIAGNOSIS — M6281 Muscle weakness (generalized): Secondary | ICD-10-CM

## 2018-12-15 NOTE — Therapy (Signed)
[place] [facility] [address] [place], [place], [address] Phone: [phone]   Fax:  [phone]  Physical Therapy Treatment  Patient Details  Name: Joanna Ortiz MRN: [id number] Date of Birth: 11/19/1945 Referring Provider (PT): Dr. Lennox Solders, MD   Encounter Date: 12/15/2018  PT End of Session - 12/15/18 1708    Visit Number  4    Number of Visits  16    Date for PT Re-Evaluation  01/26/19    Authorization - Visit Number  4    Authorization - Number of Visits  10    PT Start Time  1115    PT Stop Time  1203    PT Time Calculation (min)  48 min    Activity Tolerance  Patient tolerated treatment well;Patient limited by pain    Behavior During Therapy  [facility] for tasks assessed/performed       Past Medical History:  Diagnosis Date  . Anemia   . Arthritis   . Collagen vascular disease ([place])   . Coronary artery disease   . Crohn disease ([place])   . Diabetes mellitus without complication (HCC)    diet controlled  . Hyperlipidemia   . Hypothyroidism   . Pneumonia    3/18  . Thyroid disease    goiter    Past Surgical History:  Procedure Laterality Date  . ABDOMINAL HYSTERECTOMY  1987  . APPENDECTOMY    . BREAST BIOPSY Bilateral    neg  . CATARACT EXTRACTION W/PHACO Right 11/06/2016   Procedure: CATARACT EXTRACTION PHACO AND INTRAOCULAR LENS PLACEMENT (IOC);  Surgeon: Birder Robson, MD;  Location: [facility];  Service: Ophthalmology;  Laterality: Right;  [place] 00:42.6 AP% 16.7 CDE 7.14 Fluid Pack Lot # [id number]  . CATARACT EXTRACTION W/PHACO Left 12/25/2016   Procedure: CATARACT EXTRACTION PHACO AND INTRAOCULAR LENS PLACEMENT (IOC);  Surgeon: Birder Robson, MD;  Location: [facility];  Service: Ophthalmology;  Laterality: Left;  [place] 00:32 AP% 13.7 CDE 4.40 Fluid pack lot # [id number]  . COLON SURGERY  06/06/2011   Resection of terminal ileum and previous ileocolonic anastomosis for recurrent Crohn's disease. 3 focal  strictures identified.  . COLONOSCOPY  2013  . JOINT REPLACEMENT Left    SHOULDER  . LEFT HEART CATH AND CORONARY ANGIOGRAPHY Left 04/04/2018   Procedure: LEFT HEART CATH AND CORONARY ANGIOGRAPHY;  Surgeon: Corey Skains, MD;  Location: [place] [facility];  Service: Cardiovascular;  Laterality: Left;  . SHOULDER SURGERY Right 11/20/2018   Procedure: R SHOULDER ARTHROSCOPY: biceps tenomyotomy, distal claviculectomy, acromioplasty, posterior capsule release, debridement, decrompression of subacromial space, coracoacromial release, bursectomy  . SMALL INTESTINE SURGERY  2012  . THYROID SURGERY  1992  . TONSILLECTOMY      There were no vitals filed for this visit.  Subjective Assessment - 12/15/18 1706    Subjective  Pt entered PT without use of sling. Pt reports using sling for several hours during church yesterday and that she feels more stiff and painful as a result.  Pt reports 4/10 pain in R shoulder.  Pt reports performing HEP daily, but only performed pulleys twice yesterday.    Pertinent History  Pt hx of progressive R shoulder pain and stiffness.  Received steroid injections in June that provided significant pain relief.  However, after a visit with her chiropractor for some left-sided neck pain in July, she reported the return of R shoulder pain with increased "catching" and dull aching (no trauma reported).  Pt was  dx with a labral tear of the long head of biceps tendon of the R UE.  Pt is presenting to therapy s/p R shoulder arthroscopy on 11/20/2018 (biceps tenomyotomy, debridement, subacromial decompression, capsule release, bursectomy).  Pt c/c L shoulder constant, aching pain (best: 2/10 with ice, rest and meds; worst: 7/10 with using arm, reaching across body; worse in the morning).  Pt hx of L TSA several years ago, R UE arthritis, CAD, hyperlipidemia, DM2, Crohn's disease.    Limitations  House hold activities;Writing;Lifting    Patient Stated Goals  Increase L UE ROM and  decrease pain with ADLs    Currently in Pain?  Yes    Pain Score  4     Pain Location  Shoulder    Pain Orientation  Right    Pain Descriptors / Indicators  Aching;Constant      TREATMENT: Pt limited by pain and stiffness 2/2 wearing sling for several hours yesterday.  Therapeutic Exercise: Seated AAROM with pulley and therapist assisted motion: flexion, abduction - 2x20 each, with significant guarding and upper trap compensation, able to reduce guarding in flexion with verbal and tactile cueing (range ~115 with increased guarding at end range); abduction (range ~90 with significant guarding throughout)  Manual Therapy: Supine STM to R shoulder Supine R shoulder PROM: flexion (120 deg), abduction (90), ER, IR - x4 each with gentle oscillations to aid with relaxation    PT Education - 12/15/18 1707    Education provided  Yes    Education Details  Pt educated on reduced sling wear    Person(s) Educated  Patient    Methods  Explanation    Comprehension  Verbalized understanding       PT Short Term Goals - 12/01/18 1425      PT SHORT TERM GOAL #1   Title  Pt will demonstrate >100 degrees of PROM flexion/ abduction of R UE to demonstrate increased ROM with decreased guarding.    Baseline  R UE PROM flexion: 95, abduction: 85 with significant painful guarding 12/01/2018    Time  4    Period  Weeks    Status  New    Target Date  12/29/18        PT Long Term Goals - 12/01/18 1415      PT LONG TERM GOAL #1   Title  Pt will increase FOTO score to 56 to demonstrate increased functional mobility.    Baseline  FOTO score 32 12/01/2018    Time  8    Period  Weeks    Status  New    Target Date  01/26/19      PT LONG TERM GOAL #2   Title  Pt will demonstrate R UE A/AROM to [facility] as compared to L UE to demonstrate increased UE mobility.    Baseline  R UE PROM grossly limited 2/2 pain (flexion: 95, abd: 85, ER: 20) 12/01/2018    Time  8    Period  Weeks    Status  New    Target  Date  01/26/19      PT LONG TERM GOAL #3   Title  Pt will report <4/10 R shoulder pain at worst with movement to indicate decreased pain with activity.    Baseline  7/10 worst R shoulder pain with R UE movement 12/01/2018    Time  8    Period  Weeks    Status  New    Target Date  01/26/19  PT LONG TERM GOAL #4   Title  Pt will be able to reach across body without pain for bathing and dressing.    Baseline  Horizontal abduction limited and significantly painful 12/01/2018    Time  8    Period  Weeks    Status  New    Target Date  01/26/19            Plan - 12/15/18 1708    Clinical Impression Statement  Pt supine PROM painful and limited, but increased to 120 deg flexion and 90 deg of abduction.  Pt abduction AAROM with pulley pain-limited and very guarded with upper trap compensation, unable to reduce with cueing.  Pt able to reduce guarding with verbal and tactile cueing with flexion AAROM.  Pt will benefit from further skilled therapy to increase R shoulder mobility, strength, and decrease pain.    Personal Factors and Comorbidities  Age;Comorbidity 3+    Comorbidities  CAD, hyperlipidemia, DM2, Crohn's disease    Examination-Activity Limitations  Bed Mobility;Carry;Bathing;Dressing;Hygiene/Grooming;Lift;Reach Overhead    Stability/Clinical Decision Making  Evolving/Moderate complexity    Clinical Decision Making  Moderate    Rehab Potential  Good    PT Frequency  2x / week    PT Duration  8 weeks    PT Treatment/Interventions  Moist Heat;Cryotherapy;Electrical Stimulation;Functional mobility training;Therapeutic activities;Therapeutic exercise;Manual techniques;Patient/family education;Passive range of motion;ADLs/Self Care Home Management;Ultrasound;Neuromuscular re-education    PT Next Visit Plan  Progress PROM, progress scapular strengthening (supine serratus punches), dowel AAROM in supine and sitting    PT Home Exercise Plan  see HEP    Consulted and Agree with Plan  of Care  Patient       Patient will benefit from skilled therapeutic intervention in order to improve the following deficits and impairments:  Improper body mechanics, Pain, Postural dysfunction, Decreased mobility, Decreased activity tolerance, Decreased endurance, Decreased range of motion, Decreased strength, Hypomobility, Impaired UE functional use, Impaired flexibility  Visit Diagnosis: S/P right shoulder surgery  Joint stiffness  Muscle weakness (generalized)  Abnormal posture  Acute pain of right shoulder     Problem List Patient Active Problem List   Diagnosis Date Noted  . Stable angina ([place]) 04/03/2018  . Right groin pain 06/22/2016  . Acute bronchitis 05/01/2016  . Diastasis recti 10/29/2015  . Rectal bleeding 06/10/2014  . AP (abdominal pain) 12/29/2013  . Crohn's disease ([place]) 12/18/2013    Chinita Greenland, SPT 12/15/2018, 5:21 PM  [facility] [address]. [place], [place], [address] Phone: [phone]   Fax:  [phone]  Name: Joanna Ortiz MRN: [id number] Date of Birth: [birth year]

## 2018-12-17 ENCOUNTER — Other Ambulatory Visit: Payer: Self-pay

## 2018-12-17 ENCOUNTER — Ambulatory Visit: Payer: Medicare Other

## 2018-12-17 ENCOUNTER — Encounter: Payer: Self-pay | Admitting: Physical Therapy

## 2018-12-17 DIAGNOSIS — M2560 Stiffness of unspecified joint, not elsewhere classified: Secondary | ICD-10-CM

## 2018-12-17 DIAGNOSIS — M25511 Pain in right shoulder: Secondary | ICD-10-CM

## 2018-12-17 DIAGNOSIS — R293 Abnormal posture: Secondary | ICD-10-CM

## 2018-12-17 DIAGNOSIS — M6281 Muscle weakness (generalized): Secondary | ICD-10-CM

## 2018-12-17 DIAGNOSIS — Z9889 Other specified postprocedural states: Secondary | ICD-10-CM | POA: Diagnosis not present

## 2018-12-17 DIAGNOSIS — M19011 Primary osteoarthritis, right shoulder: Secondary | ICD-10-CM

## 2018-12-17 NOTE — Therapy (Signed)
[facility] [address]. [place], [place], [address] Phone: [phone]   Fax:  [phone]  Physical Therapy Treatment  Patient Details  Name: Joanna Ortiz MRN: [id number] Date of Birth: February 05, 1946 Referring Provider (PT): Dr. Lennox Solders, MD   Encounter Date: 12/17/2018  PT End of Session - 12/17/18 1218    Visit Number  6    Number of Visits  16    Date for PT Re-Evaluation  01/26/19    Authorization - Visit Number  6    Authorization - Number of Visits  10    PT Start Time  1115    PT Stop Time  1213    PT Time Calculation (min)  58 min    Activity Tolerance  Patient tolerated treatment well;Patient limited by pain    Behavior During Therapy  [facility] for tasks assessed/performed       Past Medical History:  Diagnosis Date  . Anemia   . Arthritis   . Collagen vascular disease ([place])   . Coronary artery disease   . Crohn disease ([place])   . Diabetes mellitus without complication (HCC)    diet controlled  . Hyperlipidemia   . Hypothyroidism   . Pneumonia    3/18  . Thyroid disease    goiter    Past Surgical History:  Procedure Laterality Date  . ABDOMINAL HYSTERECTOMY  1987  . APPENDECTOMY    . BREAST BIOPSY Bilateral    neg  . CATARACT EXTRACTION W/PHACO Right 11/06/2016   Procedure: CATARACT EXTRACTION PHACO AND INTRAOCULAR LENS PLACEMENT (IOC);  Surgeon: Birder Robson, MD;  Location: [facility];  Service: Ophthalmology;  Laterality: Right;  [place] 00:42.6 AP% 16.7 CDE 7.14 Fluid Pack Lot # [id number]  . CATARACT EXTRACTION W/PHACO Left 12/25/2016   Procedure: CATARACT EXTRACTION PHACO AND INTRAOCULAR LENS PLACEMENT (IOC);  Surgeon: Birder Robson, MD;  Location: [facility];  Service: Ophthalmology;  Laterality: Left;  [place] 00:32 AP% 13.7 CDE 4.40 Fluid pack lot # [id number]  . COLON SURGERY  06/06/2011   Resection of terminal ileum and previous ileocolonic anastomosis for recurrent Crohn's disease. 3 focal  strictures identified.  . COLONOSCOPY  2013  . JOINT REPLACEMENT Left    SHOULDER  . LEFT HEART CATH AND CORONARY ANGIOGRAPHY Left 04/04/2018   Procedure: LEFT HEART CATH AND CORONARY ANGIOGRAPHY;  Surgeon: Corey Skains, MD;  Location: [place] [facility];  Service: Cardiovascular;  Laterality: Left;  . SHOULDER SURGERY Right 11/20/2018   Procedure: R SHOULDER ARTHROSCOPY: biceps tenomyotomy, distal claviculectomy, acromioplasty, posterior capsule release, debridement, decrompression of subacromial space, coracoacromial release, bursectomy  . SMALL INTESTINE SURGERY  2012  . THYROID SURGERY  1992  . TONSILLECTOMY      There were no vitals filed for this visit.  Subjective Assessment - 12/17/18 1120    Subjective  Pt reports 4/10 aching pain in R shoulder. Pt reports has not used sling at home since Sunday and was able to cut potatoes this morning and do some cooking.  Pt reports taking 2 tylenol this morning prior to PT.  Pt reports performing HEP daily.    Pertinent History  Pt hx of progressive R shoulder pain and stiffness.  Received steroid injections in June that provided significant pain relief.  However, after a visit with her chiropractor for some left-sided neck pain in July, she reported the return of R shoulder pain with increased "catching" and dull aching (no trauma reported).  Pt was dx  with a labral tear of the long head of biceps tendon of the R UE.  Pt is presenting to therapy s/p R shoulder arthroscopy on 11/20/2018 (biceps tenomyotomy, debridement, subacromial decompression, capsule release, bursectomy).  Pt c/c L shoulder constant, aching pain (best: 2/10 with ice, rest and meds; worst: 7/10 with using arm, reaching across body; worse in the morning).  Pt hx of L TSA several years ago, R UE arthritis, CAD, hyperlipidemia, DM2, Crohn's disease.    Limitations  House hold activities;Writing;Lifting    Patient Stated Goals  Increase L UE ROM and decrease pain with ADLs     Currently in Pain?  Yes    Pain Score  4     Pain Location  Shoulder    Pain Orientation  Right    Pain Descriptors / Indicators  Aching;Constant        Therapeutic Exercise: AAROM with pulley: R flexion, scaption, and abduction - 2x10 each with intermittent deltoid muscular spasms AAROM with pulley + therapist assisted motion: R flexion, scaption and abd - 1x5 each with 15 sec holds  Manual Therapy: Flexion PROM TPR to L lat, teres minor and infraspinatus with slight oscillations -  Abduction PROM with block of scapula and again with scapular manual assist - x5 each  Ended session with 10 minutes of ice to posterior/ lateral R shoulder  Clinical Impression: Pt TTP with posterior delt, lat, teres minor, and infraspinatus.  Therapist performed TPR of lat, teres minor and infraspinatus with slight decrease of muscular spasms during PROM and pt reported "less catching."  Pt with 100 deg scaption in AA/PROM.  Pt will benefit from further skilled therapy to increase pain-free ROM for functional mobility and ease with ADLs.     PT Education - 12/17/18 1141    Education provided  Yes    Education Details  Pt educated on pulley abduction mechanics    Person(s) Educated  Patient    Methods  Explanation;Demonstration    Comprehension  Verbalized understanding;Returned demonstration       PT Short Term Goals - 12/01/18 1425      PT SHORT TERM GOAL #1   Title  Pt will demonstrate >100 degrees of PROM flexion/ abduction of R UE to demonstrate increased ROM with decreased guarding.    Baseline  R UE PROM flexion: 95, abduction: 85 with significant painful guarding 12/01/2018    Time  4    Period  Weeks    Status  New    Target Date  12/29/18        PT Long Term Goals - 12/01/18 1415      PT LONG TERM GOAL #1   Title  Pt will increase FOTO score to 56 to demonstrate increased functional mobility.    Baseline  FOTO score 32 12/01/2018    Time  8    Period  Weeks    Status  New     Target Date  01/26/19      PT LONG TERM GOAL #2   Title  Pt will demonstrate R UE A/AROM to [facility] as compared to L UE to demonstrate increased UE mobility.    Baseline  R UE PROM grossly limited 2/2 pain (flexion: 95, abd: 85, ER: 20) 12/01/2018    Time  8    Period  Weeks    Status  New    Target Date  01/26/19      PT LONG TERM GOAL #3   Title  Pt will  report <4/10 R shoulder pain at worst with movement to indicate decreased pain with activity.    Baseline  7/10 worst R shoulder pain with R UE movement 12/01/2018    Time  8    Period  Weeks    Status  New    Target Date  01/26/19      PT LONG TERM GOAL #4   Title  Pt will be able to reach across body without pain for bathing and dressing.    Baseline  Horizontal abduction limited and significantly painful 12/01/2018    Time  8    Period  Weeks    Status  New    Target Date  01/26/19            Plan - 12/17/18 1216    Clinical Impression Statement  Pt TTP with posterior delt, lat, teres minor, and infraspinatus.  Therapist performed TPR of lat, teres minor and infraspinatus with slight decrease of muscular spasms during PROM and pt reported "less catching."  Pt with 100 deg scaption in AA/PROM.  Pt will benefit from further skilled therapy to increase pain-free ROM for functional mobility and ease with ADLs.    Personal Factors and Comorbidities  Age;Comorbidity 3+    Comorbidities  CAD, hyperlipidemia, DM2, Crohn's disease    Examination-Activity Limitations  Bed Mobility;Carry;Bathing;Dressing;Hygiene/Grooming;Lift;Reach Overhead    Stability/Clinical Decision Making  Evolving/Moderate complexity    Clinical Decision Making  Moderate    Rehab Potential  Good    PT Frequency  2x / week    PT Duration  8 weeks    PT Treatment/Interventions  Moist Heat;Cryotherapy;Electrical Stimulation;Functional mobility training;Therapeutic activities;Therapeutic exercise;Manual techniques;Patient/family education;Passive range of  motion;ADLs/Self Care Home Management;Ultrasound;Neuromuscular re-education    PT Next Visit Plan  TPR, progress PROM/ AAROM, scapular strengthening    PT Home Exercise Plan  see HEP    Consulted and Agree with Plan of Care  Patient       Patient will benefit from skilled therapeutic intervention in order to improve the following deficits and impairments:  Improper body mechanics, Pain, Postural dysfunction, Decreased mobility, Decreased activity tolerance, Decreased endurance, Decreased range of motion, Decreased strength, Hypomobility, Impaired UE functional use, Impaired flexibility  Visit Diagnosis: S/P right shoulder surgery  Joint stiffness  Muscle weakness (generalized)  Abnormal posture  Acute pain of right shoulder  Arthritis of right shoulder region     Problem List Patient Active Problem List   Diagnosis Date Noted  . Stable angina ([place]) 04/03/2018  . Right groin pain 06/22/2016  . Acute bronchitis 05/01/2016  . Diastasis recti 10/29/2015  . Rectal bleeding 06/10/2014  . AP (abdominal pain) 12/29/2013  . Crohn's disease ([place]) 12/18/2013    Chinita Greenland, SPT 12/17/2018, 12:19 PM  [facility] [address] [place], [place], [address] Phone: [phone]   Fax:  [phone]  Name: Joanna Ortiz MRN: [id number] Date of Birth: 19-Jun-1945

## 2018-12-22 ENCOUNTER — Encounter: Payer: Self-pay | Admitting: Physical Therapy

## 2018-12-22 ENCOUNTER — Ambulatory Visit: Payer: Medicare Other | Attending: Orthopedic Surgery | Admitting: Physical Therapy

## 2018-12-22 ENCOUNTER — Other Ambulatory Visit: Payer: Self-pay

## 2018-12-22 DIAGNOSIS — M6281 Muscle weakness (generalized): Secondary | ICD-10-CM | POA: Diagnosis present

## 2018-12-22 DIAGNOSIS — Z9889 Other specified postprocedural states: Secondary | ICD-10-CM | POA: Diagnosis not present

## 2018-12-22 DIAGNOSIS — M25511 Pain in right shoulder: Secondary | ICD-10-CM | POA: Insufficient documentation

## 2018-12-22 DIAGNOSIS — M2560 Stiffness of unspecified joint, not elsewhere classified: Secondary | ICD-10-CM | POA: Insufficient documentation

## 2018-12-22 DIAGNOSIS — R293 Abnormal posture: Secondary | ICD-10-CM

## 2018-12-22 DIAGNOSIS — M19011 Primary osteoarthritis, right shoulder: Secondary | ICD-10-CM | POA: Insufficient documentation

## 2018-12-22 NOTE — Therapy (Signed)
[facility] [address] [place], [place], [address] Phone: [phone]   Fax:  [phone]  Physical Therapy Treatment  Patient Details  Name: Joanna Ortiz MRN: [id number] Date of Birth: Oct 27, 1945 Referring Provider (PT): Dr. Lennox Solders, MD   Encounter Date: 12/22/2018  PT End of Session - 12/22/18 1229    Visit Number  7    Number of Visits  16    Date for PT Re-Evaluation  01/26/19    Authorization - Visit Number  7    Authorization - Number of Visits  10    PT Start Time  [id number]    PT Stop Time  1202    PT Time Calculation (min)  46 min    Activity Tolerance  Patient tolerated treatment well;Patient limited by pain    Behavior During Therapy  [facility] for tasks assessed/performed       Past Medical History:  Diagnosis Date  . Anemia   . Arthritis   . Collagen vascular disease ([place])   . Coronary artery disease   . Crohn disease ([place])   . Diabetes mellitus without complication (HCC)    diet controlled  . Hyperlipidemia   . Hypothyroidism   . Pneumonia    3/18  . Thyroid disease    goiter    Past Surgical History:  Procedure Laterality Date  . ABDOMINAL HYSTERECTOMY  1987  . APPENDECTOMY    . BREAST BIOPSY Bilateral    neg  . CATARACT EXTRACTION W/PHACO Right 11/06/2016   Procedure: CATARACT EXTRACTION PHACO AND INTRAOCULAR LENS PLACEMENT (IOC);  Surgeon: Birder Robson, MD;  Location: [facility];  Service: Ophthalmology;  Laterality: Right;  [place] 00:42.6 AP% 16.7 CDE 7.14 Fluid Pack Lot # [id number]  . CATARACT EXTRACTION W/PHACO Left 12/25/2016   Procedure: CATARACT EXTRACTION PHACO AND INTRAOCULAR LENS PLACEMENT (IOC);  Surgeon: Birder Robson, MD;  Location: [facility];  Service: Ophthalmology;  Laterality: Left;  [place] 00:32 AP% 13.7 CDE 4.40 Fluid pack lot # [id number]  . COLON SURGERY  06/06/2011   Resection of terminal ileum and previous ileocolonic anastomosis for recurrent Crohn's disease. 3 focal  strictures identified.  . COLONOSCOPY  2013  . JOINT REPLACEMENT Left    SHOULDER  . LEFT HEART CATH AND CORONARY ANGIOGRAPHY Left 04/04/2018   Procedure: LEFT HEART CATH AND CORONARY ANGIOGRAPHY;  Surgeon: Corey Skains, MD;  Location: [place] [facility];  Service: Cardiovascular;  Laterality: Left;  . SHOULDER SURGERY Right 11/20/2018   Procedure: R SHOULDER ARTHROSCOPY: biceps tenomyotomy, distal claviculectomy, acromioplasty, posterior capsule release, debridement, decrompression of subacromial space, coracoacromial release, bursectomy  . SMALL INTESTINE SURGERY  2012  . THYROID SURGERY  1992  . TONSILLECTOMY      There were no vitals filed for this visit.  Subjective Assessment - 12/22/18 1117    Subjective  Pt reports 4/10 aching pain in R shoulder. Pt. states she used sling while at [facility] the other day and it made her shoulder hurt/ worse.  Pt. remains compliant with HEP.    Pertinent History  Pt hx of progressive R shoulder pain and stiffness.  Received steroid injections in June that provided significant pain relief.  However, after a visit with her chiropractor for some left-sided neck pain in July, she reported the return of R shoulder pain with increased "catching" and dull aching (no trauma reported).  Pt was dx with a labral tear of the long head of biceps tendon of the R UE.  Pt is presenting to therapy s/p R shoulder arthroscopy on 11/20/2018 (biceps tenomyotomy, debridement, subacromial decompression, capsule release, bursectomy).  Pt c/c L shoulder constant, aching pain (best: 2/10 with ice, rest and meds; worst: 7/10 with using arm, reaching across body; worse in the morning).  Pt hx of L TSA several years ago, R UE arthritis, CAD, hyperlipidemia, DM2, Crohn's disease.    Limitations  House hold activities;Writing;Lifting    Patient Stated Goals  Increase L UE ROM and decrease pain with ADLs    Currently in Pain?  Yes    Pain Score  4     Pain Location  Shoulder    Pain  Orientation  Right    Pain Descriptors / Indicators  Aching         There.ex.:  Standing wall ladder: flexion (8x)- marked with sticker/ scaption (5x)- more pain limited.   Supine wand ex.: press-ups/ shoulder flexion 15x2 each.  PT assist to increase R elbow extension with end-range press-up. Supine R shoulder isometrics (manual): biceps/ triceps/ ER/ IR (3x each): min. To moderate resistance. Supine R elbow AROM (flexion/ extension) 10x  Manual tx.:  Supine R shoulder/ proximal biceps and triceps STM Supine R shoulder AA/PROM: flexion/ abduction/ ER 10x.  Supine R shoulder grade II-III AP/PA 2x20 sec. (tenderness with hand placement at anterior aspect of shoulder). Supine gentle oscillations/ distraction 5x  Pt. Will ice at home and complete pulley ex.    PT Short Term Goals - 12/01/18 1425      PT SHORT TERM GOAL #1   Title  Pt will demonstrate >100 degrees of PROM flexion/ abduction of R UE to demonstrate increased ROM with decreased guarding.    Baseline  R UE PROM flexion: 95, abduction: 85 with significant painful guarding 12/01/2018    Time  4    Period  Weeks    Status  New    Target Date  12/29/18        PT Long Term Goals - 12/01/18 1415      PT LONG TERM GOAL #1   Title  Pt will increase FOTO score to 56 to demonstrate increased functional mobility.    Baseline  FOTO score 32 12/01/2018    Time  8    Period  Weeks    Status  New    Target Date  01/26/19      PT LONG TERM GOAL #2   Title  Pt will demonstrate R UE A/AROM to [facility] as compared to L UE to demonstrate increased UE mobility.    Baseline  R UE PROM grossly limited 2/2 pain (flexion: 95, abd: 85, ER: 20) 12/01/2018    Time  8    Period  Weeks    Status  New    Target Date  01/26/19      PT LONG TERM GOAL #3   Title  Pt will report <4/10 R shoulder pain at worst with movement to indicate decreased pain with activity.    Baseline  7/10 worst R shoulder pain with R UE movement 12/01/2018     Time  8    Period  Weeks    Status  New    Target Date  01/26/19      PT LONG TERM GOAL #4   Title  Pt will be able to reach across body without pain for bathing and dressing.    Baseline  Horizontal abduction limited and significantly painful 12/01/2018    Time  8  Period  Weeks    Status  New    Target Date  01/26/19        Plan - 12/22/18 1230    Clinical Impression Statement  Supine R shoulder PROM >100 deg. remains pain limited/ guarded.  Moderate R anterior deltoid/ prox. biceps tenderness with palpation/ STM.  Pt. has difficulty tolerating grade II-III AP/PA R shoulder mobs. secondary to tendenress/ discomfort.  PT encouraging pt. to increase arm swing with walking and use R shoulder more with daily tasks.  Pt. able to manage hair/ reach across body with R UE but ROM limitations/ compensatory techniques noted.  Pt. will continue with current HEP.    Personal Factors and Comorbidities  Age;Comorbidity 3+    Comorbidities  CAD, hyperlipidemia, DM2, Crohn's disease    Examination-Activity Limitations  Bed Mobility;Carry;Bathing;Dressing;Hygiene/Grooming;Lift;Reach Overhead    Stability/Clinical Decision Making  Evolving/Moderate complexity    Clinical Decision Making  Moderate    Rehab Potential  Good    PT Frequency  2x / week    PT Duration  8 weeks    PT Treatment/Interventions  Moist Heat;Cryotherapy;Electrical Stimulation;Functional mobility training;Therapeutic activities;Therapeutic exercise;Manual techniques;Patient/family education;Passive range of motion;ADLs/Self Care Home Management;Ultrasound;Neuromuscular re-education    PT Next Visit Plan  TPR, progress PROM/ AAROM, scapular strengthening    PT Home Exercise Plan  see HEP    Consulted and Agree with Plan of Care  Patient       Patient will benefit from skilled therapeutic intervention in order to improve the following deficits and impairments:  Improper body mechanics, Pain, Postural dysfunction, Decreased  mobility, Decreased activity tolerance, Decreased endurance, Decreased range of motion, Decreased strength, Hypomobility, Impaired UE functional use, Impaired flexibility  Visit Diagnosis: S/P right shoulder surgery  Joint stiffness  Muscle weakness (generalized)  Abnormal posture  Acute pain of right shoulder  Arthritis of right shoulder region     Problem List Patient Active Problem List   Diagnosis Date Noted  . Stable angina ([place]) 04/03/2018  . Right groin pain 06/22/2016  . Acute bronchitis 05/01/2016  . Diastasis recti 10/29/2015  . Rectal bleeding 06/10/2014  . AP (abdominal pain) 12/29/2013  . Crohn's disease ([place]) 12/18/2013   Pura Spice, PT, DPT # [id number] 12/22/2018, 12:34 PM  [place] [facility] [address] [place], [place], [address] Phone: [phone]   Fax:  [phone]  Name: GEORGAN REISE MRN: [id number] Date of Birth: 12-05-1945

## 2018-12-24 ENCOUNTER — Encounter: Payer: Federal, State, Local not specified - PPO | Admitting: Physical Therapy

## 2018-12-25 ENCOUNTER — Ambulatory Visit: Payer: Medicare Other | Admitting: Physical Therapy

## 2018-12-25 ENCOUNTER — Encounter: Payer: Self-pay | Admitting: Physical Therapy

## 2018-12-25 ENCOUNTER — Other Ambulatory Visit: Payer: Self-pay

## 2018-12-25 DIAGNOSIS — M25511 Pain in right shoulder: Secondary | ICD-10-CM

## 2018-12-25 DIAGNOSIS — Z9889 Other specified postprocedural states: Secondary | ICD-10-CM | POA: Diagnosis not present

## 2018-12-25 DIAGNOSIS — M2560 Stiffness of unspecified joint, not elsewhere classified: Secondary | ICD-10-CM

## 2018-12-25 DIAGNOSIS — M6281 Muscle weakness (generalized): Secondary | ICD-10-CM

## 2018-12-25 DIAGNOSIS — R293 Abnormal posture: Secondary | ICD-10-CM

## 2018-12-25 NOTE — Therapy (Signed)
[place] [facility] [address] [place], [place], [address] Phone: [phone]   Fax:  [phone]  Physical Therapy Treatment  Patient Details  Name: Joanna Ortiz MRN: [id number] Date of Birth: 05-Mar-1945 Referring Provider (PT): Dr. Lennox Solders, MD   Encounter Date: 12/25/2018  PT End of Session - 12/25/18 0904    Visit Number  8    Number of Visits  16    Date for PT Re-Evaluation  01/26/19    Authorization - Visit Number  8    Authorization - Number of Visits  10    PT Start Time  0856    PT Stop Time  0940    PT Time Calculation (min)  44 min    Activity Tolerance  Patient tolerated treatment well;Patient limited by pain    Behavior During Therapy  [facility] for tasks assessed/performed       Past Medical History:  Diagnosis Date  . Anemia   . Arthritis   . Collagen vascular disease ([place])   . Coronary artery disease   . Crohn disease ([place])   . Diabetes mellitus without complication (HCC)    diet controlled  . Hyperlipidemia   . Hypothyroidism   . Pneumonia    3/18  . Thyroid disease    goiter    Past Surgical History:  Procedure Laterality Date  . ABDOMINAL HYSTERECTOMY  1987  . APPENDECTOMY    . BREAST BIOPSY Bilateral    neg  . CATARACT EXTRACTION W/PHACO Right 11/06/2016   Procedure: CATARACT EXTRACTION PHACO AND INTRAOCULAR LENS PLACEMENT (IOC);  Surgeon: Birder Robson, MD;  Location: [facility];  Service: Ophthalmology;  Laterality: Right;  [place] 00:42.6 AP% 16.7 CDE 7.14 Fluid Pack Lot # [id number]  . CATARACT EXTRACTION W/PHACO Left 12/25/2016   Procedure: CATARACT EXTRACTION PHACO AND INTRAOCULAR LENS PLACEMENT (IOC);  Surgeon: Birder Robson, MD;  Location: [facility];  Service: Ophthalmology;  Laterality: Left;  [place] 00:32 AP% 13.7 CDE 4.40 Fluid pack lot # [id number]  . COLON SURGERY  06/06/2011   Resection of terminal ileum and previous ileocolonic anastomosis for recurrent Crohn's disease. 3 focal  strictures identified.  . COLONOSCOPY  2013  . JOINT REPLACEMENT Left    SHOULDER  . LEFT HEART CATH AND CORONARY ANGIOGRAPHY Left 04/04/2018   Procedure: LEFT HEART CATH AND CORONARY ANGIOGRAPHY;  Surgeon: Corey Skains, MD;  Location: [facility];  Service: Cardiovascular;  Laterality: Left;  . SHOULDER SURGERY Right 11/20/2018   Procedure: R SHOULDER ARTHROSCOPY: biceps tenomyotomy, distal claviculectomy, acromioplasty, posterior capsule release, debridement, decrompression of subacromial space, coracoacromial release, bursectomy  . SMALL INTESTINE SURGERY  2012  . THYROID SURGERY  1992  . TONSILLECTOMY      There were no vitals filed for this visit.  Subjective Assessment - 12/25/18 0902    Subjective  Pt reports 3.5/10 aching pain in R shoulder prior to tx. session.  Pt. went to MD at [facility] for GI issues.  Pt. received a new patch for nausea.    Pertinent History  Pt hx of progressive R shoulder pain and stiffness.  Received steroid injections in June that provided significant pain relief.  However, after a visit with her chiropractor for some left-sided neck pain in July, she reported the return of R shoulder pain with increased "catching" and dull aching (no trauma reported).  Pt was dx with a labral tear of the long head of biceps tendon of the R UE.  Pt  is presenting to therapy s/p R shoulder arthroscopy on 11/20/2018 (biceps tenomyotomy, debridement, subacromial decompression, capsule release, bursectomy).  Pt c/c L shoulder constant, aching pain (best: 2/10 with ice, rest and meds; worst: 7/10 with using arm, reaching across body; worse in the morning).  Pt hx of L TSA several years ago, R UE arthritis, CAD, hyperlipidemia, DM2, Crohn's disease.    Limitations  House hold activities;Writing;Lifting    Patient Stated Goals  Increase L UE ROM and decrease pain with ADLs    Currently in Pain?  Yes    Pain Score  4     Pain Location  Shoulder    Pain Orientation  Right     Pain Descriptors / Indicators  Aching       \   There.ex.:  Seated shoulder pulley (flexion/ abduction)- warm-up Seated wand ex.: AAROM sh. Flexion/ chest press/ biceps 20x (mirror feedback). Seated R shoulder isometrics (manual): biceps/ triceps/ ER/ IR (8x each with 5 sec. hold): min. To moderate resistance. Standing bicep curls 2# 20x (mirror feedback).   B UBE 3 min. F/b   Manual tx.:  Seated R shoulder/ proximal biceps and triceps STM Supine R shoulder AA/PROM: flexion/ abduction/ ER 10x.  Supine R shoulder grade II-III AP/PA 2x20 sec. (tenderness with hand placement at anterior aspect of shoulder). Supine gentle oscillations/ distraction 5x  Pt. Will ice at home and complete pulley ex.    PT Short Term Goals - 12/01/18 1425      PT SHORT TERM GOAL #1   Title  Pt will demonstrate >100 degrees of PROM flexion/ abduction of R UE to demonstrate increased ROM with decreased guarding.    Baseline  R UE PROM flexion: 95, abduction: 85 with significant painful guarding 12/01/2018    Time  4    Period  Weeks    Status  New    Target Date  12/29/18        PT Long Term Goals - 12/01/18 1415      PT LONG TERM GOAL #1   Title  Pt will increase FOTO score to 56 to demonstrate increased functional mobility.    Baseline  FOTO score 32 12/01/2018    Time  8    Period  Weeks    Status  New    Target Date  01/26/19      PT LONG TERM GOAL #2   Title  Pt will demonstrate R UE A/AROM to [facility] as compared to L UE to demonstrate increased UE mobility.    Baseline  R UE PROM grossly limited 2/2 pain (flexion: 95, abd: 85, ER: 20) 12/01/2018    Time  8    Period  Weeks    Status  New    Target Date  01/26/19      PT LONG TERM GOAL #3   Title  Pt will report <4/10 R shoulder pain at worst with movement to indicate decreased pain with activity.    Baseline  7/10 worst R shoulder pain with R UE movement 12/01/2018    Time  8    Period  Weeks    Status  New    Target Date   01/26/19      PT LONG TERM GOAL #4   Title  Pt will be able to reach across body without pain for bathing and dressing.    Baseline  Horizontal abduction limited and significantly painful 12/01/2018    Time  8    Period  Weeks    Status  New    Target Date  01/26/19            Plan - 12/25/18 0904    Clinical Impression Statement  R shoulder joint stiffness/ discomfort remain during manual tx. and progression ther.ex.  Pt. continues to guard R UE during bed mobility/ walking.  Pt. instructed to increase arm swing/ movement patterns during daily tasks.  Pt. demonstrates ability to reach top of head/ opposite shoulder with minimal c/o pain.    Personal Factors and Comorbidities  Age;Comorbidity 3+    Comorbidities  CAD, hyperlipidemia, DM2, Crohn's disease    Examination-Activity Limitations  Bed Mobility;Carry;Bathing;Dressing;Hygiene/Grooming;Lift;Reach Overhead    Stability/Clinical Decision Making  Evolving/Moderate complexity    Clinical Decision Making  Moderate    Rehab Potential  Good    PT Frequency  2x / week    PT Duration  8 weeks    PT Treatment/Interventions  Moist Heat;Cryotherapy;Electrical Stimulation;Functional mobility training;Therapeutic activities;Therapeutic exercise;Manual techniques;Patient/family education;Passive range of motion;ADLs/Self Care Home Management;Ultrasound;Neuromuscular re-education    PT Next Visit Plan  TPR, progress PROM/ AAROM, scapular strengthening.  ISSUE SEATED AAROM EX.    PT Home Exercise Plan  see HEP    Consulted and Agree with Plan of Care  Patient       Patient will benefit from skilled therapeutic intervention in order to improve the following deficits and impairments:  Improper body mechanics, Pain, Postural dysfunction, Decreased mobility, Decreased activity tolerance, Decreased endurance, Decreased range of motion, Decreased strength, Hypomobility, Impaired UE functional use, Impaired flexibility  Visit Diagnosis: S/P right  shoulder surgery  Joint stiffness  Muscle weakness (generalized)  Abnormal posture  Acute pain of right shoulder     Problem List Patient Active Problem List   Diagnosis Date Noted  . Stable angina ([place]) 04/03/2018  . Right groin pain 06/22/2016  . Acute bronchitis 05/01/2016  . Diastasis recti 10/29/2015  . Rectal bleeding 06/10/2014  . AP (abdominal pain) 12/29/2013  . Crohn's disease ([place]) 12/18/2013   Pura Spice, PT, DPT # [phone] 12/26/2018, 2:41 PM  [place] [facility] [address] [place], [place], [address] Phone: [phone]   Fax:  [phone]  Name: MARGARETANN LEVERING MRN: [id number] Date of Birth: 1946/01/12

## 2018-12-29 ENCOUNTER — Other Ambulatory Visit: Payer: Self-pay

## 2018-12-29 ENCOUNTER — Encounter: Payer: Self-pay | Admitting: Physical Therapy

## 2018-12-29 ENCOUNTER — Ambulatory Visit: Payer: Medicare Other | Admitting: Physical Therapy

## 2018-12-29 DIAGNOSIS — Z9889 Other specified postprocedural states: Secondary | ICD-10-CM | POA: Diagnosis not present

## 2018-12-29 DIAGNOSIS — R293 Abnormal posture: Secondary | ICD-10-CM

## 2018-12-29 DIAGNOSIS — M19011 Primary osteoarthritis, right shoulder: Secondary | ICD-10-CM

## 2018-12-29 DIAGNOSIS — M6281 Muscle weakness (generalized): Secondary | ICD-10-CM

## 2018-12-29 DIAGNOSIS — M2560 Stiffness of unspecified joint, not elsewhere classified: Secondary | ICD-10-CM

## 2018-12-29 DIAGNOSIS — M25511 Pain in right shoulder: Secondary | ICD-10-CM

## 2018-12-29 NOTE — Therapy (Signed)
[place] [facility] [address] [place], [place], [address] Phone: [phone]   Fax:  [phone]  Physical Therapy Treatment  Patient Details  Name: Joanna Ortiz MRN: [id number] Date of Birth: 11/14/1945 Referring Provider (PT): Dr. Lennox Solders, MD   Encounter Date: 12/29/2018  PT End of Session - 12/29/18 1709    Visit Number  9    Number of Visits  16    Date for PT Re-Evaluation  01/26/19    Authorization - Visit Number  9    Authorization - Number of Visits  10    PT Start Time  1110    PT Stop Time  1154    PT Time Calculation (min)  44 min    Activity Tolerance  Patient tolerated treatment well;Patient limited by pain    Behavior During Therapy  [facility] for tasks assessed/performed       Past Medical History:  Diagnosis Date  . Anemia   . Arthritis   . Collagen vascular disease ([place])   . Coronary artery disease   . Crohn disease ([place])   . Diabetes mellitus without complication (HCC)    diet controlled  . Hyperlipidemia   . Hypothyroidism   . Pneumonia    3/18  . Thyroid disease    goiter    Past Surgical History:  Procedure Laterality Date  . ABDOMINAL HYSTERECTOMY  1987  . APPENDECTOMY    . BREAST BIOPSY Bilateral    neg  . CATARACT EXTRACTION W/PHACO Right 11/06/2016   Procedure: CATARACT EXTRACTION PHACO AND INTRAOCULAR LENS PLACEMENT (IOC);  Surgeon: Birder Robson, MD;  Location: [facility];  Service: Ophthalmology;  Laterality: Right;  [place] 00:42.6 AP% 16.7 CDE 7.14 Fluid Pack Lot # [id number]  . CATARACT EXTRACTION W/PHACO Left 12/25/2016   Procedure: CATARACT EXTRACTION PHACO AND INTRAOCULAR LENS PLACEMENT (IOC);  Surgeon: Birder Robson, MD;  Location: [facility];  Service: Ophthalmology;  Laterality: Left;  [place] 00:32 AP% 13.7 CDE 4.40 Fluid pack lot # [id number]  . COLON SURGERY  06/06/2011   Resection of terminal ileum and previous ileocolonic anastomosis for recurrent Crohn's disease. 3 focal  strictures identified.  . COLONOSCOPY  2013  . JOINT REPLACEMENT Left    SHOULDER  . LEFT HEART CATH AND CORONARY ANGIOGRAPHY Left 04/04/2018   Procedure: LEFT HEART CATH AND CORONARY ANGIOGRAPHY;  Surgeon: Corey Skains, MD;  Location: [place] [facility];  Service: Cardiovascular;  Laterality: Left;  . SHOULDER SURGERY Right 11/20/2018   Procedure: R SHOULDER ARTHROSCOPY: biceps tenomyotomy, distal claviculectomy, acromioplasty, posterior capsule release, debridement, decrompression of subacromial space, coracoacromial release, bursectomy  . SMALL INTESTINE SURGERY  2012  . THYROID SURGERY  1992  . TONSILLECTOMY      There were no vitals filed for this visit.  Subjective Assessment - 12/29/18 1708    Subjective  Pt. reports 3-4/10 R shoulder pain with movement.  Pt. states she has been compliant with HEP.    Pertinent History  Pt hx of progressive R shoulder pain and stiffness.  Received steroid injections in June that provided significant pain relief.  However, after a visit with her chiropractor for some left-sided neck pain in July, she reported the return of R shoulder pain with increased "catching" and dull aching (no trauma reported).  Pt was dx with a labral tear of the long head of biceps tendon of the R UE.  Pt is presenting to therapy s/p R shoulder arthroscopy on 11/20/2018 (biceps tenomyotomy, debridement,  subacromial decompression, capsule release, bursectomy).  Pt c/c L shoulder constant, aching pain (best: 2/10 with ice, rest and meds; worst: 7/10 with using arm, reaching across body; worse in the morning).  Pt hx of L TSA several years ago, R UE arthritis, CAD, hyperlipidemia, DM2, Crohn's disease.    Limitations  House hold activities;Writing;Lifting    Patient Stated Goals  Increase L UE ROM and decrease pain with ADLs    Currently in Pain?  Yes    Pain Score  4     Pain Location  Shoulder    Pain Orientation  Right    Pain Descriptors / Indicators  Aching           There.ex.:  Seated shoulder pulley (flexion/ abduction)- warm-up Seated wand ex.: AAROM sh. Flexion/ chest press/ biceps 20x (mirror feedback). B UBE 2 min. F/b  Supine R shoulder manual isometrics (biceps/ triceps/ ER/ IR/ extension)- 10x each (min. To moderate resistance)- cuing to avoid increase in pain.  Manual tx.:  Seated R shoulder/ proximal biceps and triceps STM Supine R shoulder AA/PROM: flexion/ abduction/ ER 10x.  Supine R shoulder grade II-III AP/PA 2x20 sec. (tenderness with hand placement at anterior aspect of shoulder). Supine gentle oscillations/ distraction 5x    PT Short Term Goals - 12/01/18 1425      PT SHORT TERM GOAL #1   Title  Pt will demonstrate >100 degrees of PROM flexion/ abduction of R UE to demonstrate increased ROM with decreased guarding.    Baseline  R UE PROM flexion: 95, abduction: 85 with significant painful guarding 12/01/2018    Time  4    Period  Weeks    Status  New    Target Date  12/29/18        PT Long Term Goals - 12/01/18 1415      PT LONG TERM GOAL #1   Title  Pt will increase FOTO score to 56 to demonstrate increased functional mobility.    Baseline  FOTO score 32 12/01/2018    Time  8    Period  Weeks    Status  New    Target Date  01/26/19      PT LONG TERM GOAL #2   Title  Pt will demonstrate R UE A/AROM to [facility] as compared to L UE to demonstrate increased UE mobility.    Baseline  R UE PROM grossly limited 2/2 pain (flexion: 95, abd: 85, ER: 20) 12/01/2018    Time  8    Period  Weeks    Status  New    Target Date  01/26/19      PT LONG TERM GOAL #3   Title  Pt will report <4/10 R shoulder pain at worst with movement to indicate decreased pain with activity.    Baseline  7/10 worst R shoulder pain with R UE movement 12/01/2018    Time  8    Period  Weeks    Status  New    Target Date  01/26/19      PT LONG TERM GOAL #4   Title  Pt will be able to reach across body without pain for bathing and  dressing.    Baseline  Horizontal abduction limited and significantly painful 12/01/2018    Time  8    Period  Weeks    Status  New    Target Date  01/26/19            Plan - 12/29/18 1710  Clinical Impression Statement  R shoulder/scapular stiffness and discomfort remain with R shoulder A/PROM.  Pt. works hard with ROM ex. during tx./ manual isometrics but remains pain limited.  PT instructs pt. several times during tx. to avoid pain with isometric muscle contraction.  Pain limited R shoulder abduction and ER in seated/ standing posture.  No change to HEP at this time.    Personal Factors and Comorbidities  Age;Comorbidity 3+    Comorbidities  CAD, hyperlipidemia, DM2, Crohn's disease    Examination-Activity Limitations  Bed Mobility;Carry;Bathing;Dressing;Hygiene/Grooming;Lift;Reach Overhead    Stability/Clinical Decision Making  Evolving/Moderate complexity    Clinical Decision Making  Moderate    Rehab Potential  Good    PT Frequency  2x / week    PT Duration  8 weeks    PT Treatment/Interventions  Moist Heat;Cryotherapy;Electrical Stimulation;Functional mobility training;Therapeutic activities;Therapeutic exercise;Manual techniques;Patient/family education;Passive range of motion;ADLs/Self Care Home Management;Ultrasound;Neuromuscular re-education    PT Next Visit Plan  TPR, progress PROM/ AAROM, scapular strengthening.  ISSUE SEATED AAROM EX.    PT Home Exercise Plan  see HEP    Consulted and Agree with Plan of Care  Patient       Patient will benefit from skilled therapeutic intervention in order to improve the following deficits and impairments:  Improper body mechanics, Pain, Postural dysfunction, Decreased mobility, Decreased activity tolerance, Decreased endurance, Decreased range of motion, Decreased strength, Hypomobility, Impaired UE functional use, Impaired flexibility  Visit Diagnosis: S/P right shoulder surgery  Joint stiffness  Muscle weakness  (generalized)  Abnormal posture  Acute pain of right shoulder  Arthritis of right shoulder region     Problem List Patient Active Problem List   Diagnosis Date Noted  . Stable angina ([place]) 04/03/2018  . Right groin pain 06/22/2016  . Acute bronchitis 05/01/2016  . Diastasis recti 10/29/2015  . Rectal bleeding 06/10/2014  . AP (abdominal pain) 12/29/2013  . Crohn's disease ([place]) 12/18/2013   Pura Spice, PT, DPT # [phone] 12/29/2018, 5:14 PM  [place] [facility] [address] [place], [place], [address] Phone: [phone]   Fax:  [phone]  Name: Joanna Ortiz MRN: [id number] Date of Birth: [birth year]

## 2018-12-31 ENCOUNTER — Ambulatory Visit: Payer: Medicare Other | Admitting: Physical Therapy

## 2018-12-31 ENCOUNTER — Other Ambulatory Visit: Payer: Self-pay

## 2018-12-31 DIAGNOSIS — Z9889 Other specified postprocedural states: Secondary | ICD-10-CM | POA: Diagnosis not present

## 2018-12-31 DIAGNOSIS — M6281 Muscle weakness (generalized): Secondary | ICD-10-CM

## 2018-12-31 DIAGNOSIS — M2560 Stiffness of unspecified joint, not elsewhere classified: Secondary | ICD-10-CM

## 2018-12-31 DIAGNOSIS — R293 Abnormal posture: Secondary | ICD-10-CM

## 2018-12-31 DIAGNOSIS — M25511 Pain in right shoulder: Secondary | ICD-10-CM

## 2018-12-31 DIAGNOSIS — M19011 Primary osteoarthritis, right shoulder: Secondary | ICD-10-CM

## 2018-12-31 NOTE — Therapy (Signed)
[facility] [address] [place], [place], [address] Phone: [phone]   Fax:  [phone]  Physical Therapy Treatment  Patient Details  Name: Joanna Ortiz MRN: [id number] Date of Birth: 25-Nov-1945 Referring Provider (PT): Dr. Lennox Solders, MD   Encounter Date: 12/31/2018  PT End of Session - 01/02/19 0937    Visit Number  10    Number of Visits  16    Date for PT Re-Evaluation  01/28/19    Authorization - Visit Number  1    Authorization - Number of Visits  10    PT Start Time  1111    PT Stop Time  1201    PT Time Calculation (min)  50 min    Activity Tolerance  Patient tolerated treatment well;Patient limited by pain    Behavior During Therapy  [facility] for tasks assessed/performed       Past Medical History:  Diagnosis Date  . Anemia   . Arthritis   . Collagen vascular disease ([place])   . Coronary artery disease   . Crohn disease ([place])   . Diabetes mellitus without complication (HCC)    diet controlled  . Hyperlipidemia   . Hypothyroidism   . Pneumonia    3/18  . Thyroid disease    goiter    Past Surgical History:  Procedure Laterality Date  . ABDOMINAL HYSTERECTOMY  1987  . APPENDECTOMY    . BREAST BIOPSY Bilateral    neg  . CATARACT EXTRACTION W/PHACO Right 11/06/2016   Procedure: CATARACT EXTRACTION PHACO AND INTRAOCULAR LENS PLACEMENT (IOC);  Surgeon: Birder Robson, MD;  Location: [facility] ORS;  Service: Ophthalmology;  Laterality: Right;  [place] 00:42.6 AP% 16.7 CDE 7.14 Fluid Pack Lot # [id number]  . CATARACT EXTRACTION W/PHACO Left 12/25/2016   Procedure: CATARACT EXTRACTION PHACO AND INTRAOCULAR LENS PLACEMENT (IOC);  Surgeon: Birder Robson, MD;  Location: [facility] ORS;  Service: Ophthalmology;  Laterality: Left;  [place] 00:32 AP% 13.7 CDE 4.40 Fluid pack lot # [id number]  . COLON SURGERY  06/06/2011   Resection of terminal ileum and previous ileocolonic anastomosis for recurrent Crohn's disease. 3 focal  strictures identified.  . COLONOSCOPY  2013  . JOINT REPLACEMENT Left    SHOULDER  . LEFT HEART CATH AND CORONARY ANGIOGRAPHY Left 04/04/2018   Procedure: LEFT HEART CATH AND CORONARY ANGIOGRAPHY;  Surgeon: Corey Skains, MD;  Location: [place] [facility];  Service: Cardiovascular;  Laterality: Left;  . SHOULDER SURGERY Right 11/20/2018   Procedure: R SHOULDER ARTHROSCOPY: biceps tenomyotomy, distal claviculectomy, acromioplasty, posterior capsule release, debridement, decrompression of subacromial space, coracoacromial release, bursectomy  . SMALL INTESTINE SURGERY  2012  . THYROID SURGERY  1992  . TONSILLECTOMY      There were no vitals filed for this visit.  Subjective Assessment - 01/02/19 0934    Subjective  Pt. reports 3-4/10 R shoulder pain with movement.  No new complaints.    Pertinent History  Pt hx of progressive R shoulder pain and stiffness.  Received steroid injections in June that provided significant pain relief.  However, after a visit with her chiropractor for some left-sided neck pain in July, she reported the return of R shoulder pain with increased "catching" and dull aching (no trauma reported).  Pt was dx with a labral tear of the long head of biceps tendon of the R UE.  Pt is presenting to therapy s/p R shoulder arthroscopy on 11/20/2018 (biceps tenomyotomy, debridement, subacromial decompression, capsule release, bursectomy).  Pt c/c L shoulder constant, aching pain (best: 2/10 with ice, rest and meds; worst: 7/10 with using arm, reaching across body; worse in the morning).  Pt hx of L TSA several years ago, R UE arthritis, CAD, hyperlipidemia, DM2, Crohn's disease.    Limitations  House hold activities;Writing;Lifting    Patient Stated Goals  Increase L UE ROM and decrease pain with ADLs    Currently in Pain?  Yes    Pain Score  4     Pain Location  Shoulder    Pain Orientation  Right    Pain Descriptors / Indicators  Aching         [facility] PT Assessment -  01/02/19 0001      Assessment   Medical Diagnosis  R shoulder arthroscopy bicep tendon repair and debridement    Referring Provider (PT)  Dr. Lennox Solders, MD    Onset Date/Surgical Date  11/20/18    Prior Therapy  yes, post-TSA and tx of cervical pain      Prior Function   Level of Independence  Independent      Cognition   Overall Cognitive Status  Within Functional Limits for tasks assessed       Pt. Scheduled for eye surgery next Wednesday.     There.ex.:  B UBE 3 min. F/b Seated wand ex.: AAROM sh. Flexion/ chest press/ biceps 20x (mirror feedback).   See new HEP (handouts provided) Supine R shoulder manual isometrics (biceps/ triceps/ ER/ IR/ extension)- 10x each (min. To moderate resistance)- cuing to avoid increase in pain.  Manual tx.:  SeatedR shoulder/ proximal biceps and triceps STM Supine R shoulder AA/PROM: flexion/ abduction/ ER 10x.  Supine R shoulder grade II-III AP/PA 2x20 sec. (tenderness with hand placement at anterior aspect of shoulder). Supine gentle oscillations/ distraction 5x    PT Education - 01/02/19 0936    Education provided  Yes    Education Details  See HEP    Person(s) Educated  Patient    Methods  Explanation;Demonstration;Handout    Comprehension  Verbalized understanding;Returned demonstration       PT Short Term Goals - 01/02/19 0957      PT SHORT TERM GOAL #1   Title  Pt will demonstrate >100 degrees of PROM flexion/ abduction of R UE to demonstrate increased ROM with decreased guarding.    Baseline  R UE PROM flexion: 95, abduction: 85 with significant painful guarding 12/01/2018.  On 11/11, R sh. flexion/ abduction PROM grossly 100 deg. (pain limited)    Time  4    Period  Weeks    Status  Achieved    Target Date  12/31/18        PT Long Term Goals - 01/02/19 0958      PT LONG TERM GOAL #1   Title  Pt will increase FOTO score to 56 to demonstrate increased functional mobility.    Baseline  FOTO score 32  12/01/2018    Time  4    Period  Weeks    Status  On-going    Target Date  01/28/19      PT LONG TERM GOAL #2   Title  Pt will demonstrate R UE A/AROM to [facility] as compared to L UE to demonstrate increased UE mobility.    Baseline  R UE PROM grossly limited 2/2 pain (flexion: 95, abd: 85, ER: 20) 12/01/2018    Time  4    Period  Weeks    Status  Not  Met    Target Date  01/28/19      PT LONG TERM GOAL #3   Title  Pt will report <4/10 R shoulder pain at worst with movement to indicate decreased pain with activity.    Baseline  7/10 worst R shoulder pain with R UE movement 12/01/2018.  On 11/11, persistent 4/10 R sh. pain with increase pain during overhead reaching    Time  4    Period  Weeks    Status  Partially Met    Target Date  01/28/19      PT LONG TERM GOAL #4   Title  Pt will be able to reach across body without pain for bathing and dressing.    Baseline  Horizontal abduction limited and significantly painful 12/01/2018    Time  4    Period  Weeks    Status  Partially Met    Target Date  01/28/19            Plan - 01/02/19 5573    Clinical Impression Statement  Pt. remains pain limited/ muscle guarded with R shoulder AROM >90 deg. flexion/ abduction.  Supine R shoulder PROM >100 deg. remains pain limited/ guarded. Moderate R anterior deltoid/ prox. biceps tenderness with palpation/ STM. Pt. has difficulty tolerating grade II-III AP/PA R shoulder mobs. secondary to tendenress/ discomfort. PT encouraging pt. to increase arm swing with walking and use R shoulder more with daily tasks. Pt. has progressed to more seated/ standing A/AROM ther.ex. (see handouts).  See updated goals.  Pt. will continue with current HEP and benefit from skilled PT services to increase R sh. ROM/ strength/ pain mgmt.    Personal Factors and Comorbidities  Age;Comorbidity 3+    Comorbidities  CAD, hyperlipidemia, DM2, Crohn's disease    Examination-Activity Limitations  Bed  Mobility;Carry;Bathing;Dressing;Hygiene/Grooming;Lift;Reach Overhead    Stability/Clinical Decision Making  Evolving/Moderate complexity    Clinical Decision Making  Moderate    Rehab Potential  Good    PT Frequency  2x / week    PT Duration  4 weeks    PT Treatment/Interventions  Moist Heat;Cryotherapy;Electrical Stimulation;Functional mobility training;Therapeutic activities;Therapeutic exercise;Manual techniques;Patient/family education;Passive range of motion;ADLs/Self Care Home Management;Ultrasound;Neuromuscular re-education    PT Next Visit Plan  Progress R sh. AROM/ strengthening.    PT Home Exercise Plan  see HEP    Consulted and Agree with Plan of Care  Patient       Patient will benefit from skilled therapeutic intervention in order to improve the following deficits and impairments:  Improper body mechanics, Pain, Postural dysfunction, Decreased mobility, Decreased activity tolerance, Decreased endurance, Decreased range of motion, Decreased strength, Hypomobility, Impaired UE functional use, Impaired flexibility  Visit Diagnosis: S/P right shoulder surgery  Joint stiffness  Muscle weakness (generalized)  Abnormal posture  Acute pain of right shoulder  Arthritis of right shoulder region     Problem List Patient Active Problem List   Diagnosis Date Noted  . Stable angina ([place]) 04/03/2018  . Right groin pain 06/22/2016  . Acute bronchitis 05/01/2016  . Diastasis recti 10/29/2015  . Rectal bleeding 06/10/2014  . AP (abdominal pain) 12/29/2013  . Crohn's disease ([place]) 12/18/2013   Pura Spice, PT, DPT # [id number] 01/02/2019, 10:01 AM  [facility] [address] [place], [place], [address] Phone: [phone]   Fax:  [phone]  Name: Joanna Ortiz MRN: [id number] Date of Birth: [birth year]

## 2018-12-31 NOTE — Patient Instructions (Signed)
Access Code: [email or web address]  URL: [email or web address]  Date: 12/31/2018  Prepared by: Dorcas Carrow   Exercises  Seated Shoulder Flexion AAROM with Dowel - 10 reps - 2 sets - 1x daily - 7x weekly  Seated Chest Press with Bar - 10 reps - 2 sets - 1x daily - 7x weekly  Seated Shoulder Abduction AAROM with Dowel - 10 reps - 2 sets - 1x daily - 7x weekly  Standing Shoulder Extension with Dowel - 10 reps - 2 sets - 1x daily - 7x weekly  Standing Bilateral Shoulder Internal Rotation AAROM with Dowel - 10 reps - 2 sets - 1x daily - 7x weekly

## 2019-01-05 ENCOUNTER — Other Ambulatory Visit: Payer: Self-pay

## 2019-01-05 ENCOUNTER — Encounter: Payer: Self-pay | Admitting: Physical Therapy

## 2019-01-05 ENCOUNTER — Ambulatory Visit: Payer: Medicare Other | Admitting: Physical Therapy

## 2019-01-05 DIAGNOSIS — M19011 Primary osteoarthritis, right shoulder: Secondary | ICD-10-CM

## 2019-01-05 DIAGNOSIS — M6281 Muscle weakness (generalized): Secondary | ICD-10-CM

## 2019-01-05 DIAGNOSIS — M25511 Pain in right shoulder: Secondary | ICD-10-CM

## 2019-01-05 DIAGNOSIS — Z9889 Other specified postprocedural states: Secondary | ICD-10-CM | POA: Diagnosis not present

## 2019-01-05 DIAGNOSIS — M2560 Stiffness of unspecified joint, not elsewhere classified: Secondary | ICD-10-CM

## 2019-01-05 DIAGNOSIS — R293 Abnormal posture: Secondary | ICD-10-CM

## 2019-01-05 NOTE — Therapy (Signed)
[facility] [address] [place], [place], [address] Phone: [phone]   Fax:  [phone]  Physical Therapy Treatment  Patient Details  Name: Joanna Ortiz MRN: [id number] Date of Birth: Nov 22, 1945 Referring Provider (PT): Dr. Lennox Solders, MD   Encounter Date: 01/05/2019  PT End of Session - 01/05/19 1232    Visit Number  11    Number of Visits  16    Date for PT Re-Evaluation  01/28/19    Authorization - Visit Number  2    Authorization - Number of Visits  10    PT Start Time  1110    PT Stop Time  1158    PT Time Calculation (min)  48 min    Activity Tolerance  Patient tolerated treatment well;Patient limited by pain    Behavior During Therapy  [facility] for tasks assessed/performed       Past Medical History:  Diagnosis Date  . Anemia   . Arthritis   . Collagen vascular disease ([place])   . Coronary artery disease   . Crohn disease ([place])   . Diabetes mellitus without complication (HCC)    diet controlled  . Hyperlipidemia   . Hypothyroidism   . Pneumonia    3/18  . Thyroid disease    goiter    Past Surgical History:  Procedure Laterality Date  . ABDOMINAL HYSTERECTOMY  1987  . APPENDECTOMY    . BREAST BIOPSY Bilateral    neg  . CATARACT EXTRACTION W/PHACO Right 11/06/2016   Procedure: CATARACT EXTRACTION PHACO AND INTRAOCULAR LENS PLACEMENT (IOC);  Surgeon: Birder Robson, MD;  Location: [facility] ORS;  Service: Ophthalmology;  Laterality: Right;  [place] 00:42.6 AP% 16.7 CDE 7.14 Fluid Pack Lot # [id number]  . CATARACT EXTRACTION W/PHACO Left 12/25/2016   Procedure: CATARACT EXTRACTION PHACO AND INTRAOCULAR LENS PLACEMENT (IOC);  Surgeon: Birder Robson, MD;  Location: [facility] ORS;  Service: Ophthalmology;  Laterality: Left;  [place] 00:32 AP% 13.7 CDE 4.40 Fluid pack lot # [id number]  . COLON SURGERY  06/06/2011   Resection of terminal ileum and previous ileocolonic anastomosis for recurrent Crohn's disease. 3 focal  strictures identified.  . COLONOSCOPY  2013  . JOINT REPLACEMENT Left    SHOULDER  . LEFT HEART CATH AND CORONARY ANGIOGRAPHY Left 04/04/2018   Procedure: LEFT HEART CATH AND CORONARY ANGIOGRAPHY;  Surgeon: Corey Skains, MD;  Location: [place] CV [facility];  Service: Cardiovascular;  Laterality: Left;  . SHOULDER SURGERY Right 11/20/2018   Procedure: R SHOULDER ARTHROSCOPY: biceps tenomyotomy, distal claviculectomy, acromioplasty, posterior capsule release, debridement, decrompression of subacromial space, coracoacromial release, bursectomy  . SMALL INTESTINE SURGERY  2012  . THYROID SURGERY  1992  . TONSILLECTOMY      There were no vitals filed for this visit.  Subjective Assessment - 01/05/19 1124    Subjective  MD appt. with Dr. Jeannie Fend 11/30.  Pt. reports being stiff/ achy in R shoulder prior to tx. session.  Pt. scheduled for eye surgery this Friday and has a Covid test on Wednesday priror to sx.    Pertinent History  Pt hx of progressive R shoulder pain and stiffness.  Received steroid injections in June that provided significant pain relief.  However, after a visit with her chiropractor for some left-sided neck pain in July, she reported the return of R shoulder pain with increased "catching" and dull aching (no trauma reported).  Pt was dx with a labral tear of the long head of biceps  tendon of the R UE.  Pt is presenting to therapy s/p R shoulder arthroscopy on 11/20/2018 (biceps tenomyotomy, debridement, subacromial decompression, capsule release, bursectomy).  Pt c/c L shoulder constant, aching pain (best: 2/10 with ice, rest and meds; worst: 7/10 with using arm, reaching across body; worse in the morning).  Pt hx of L TSA several years ago, R UE arthritis, CAD, hyperlipidemia, DM2, Crohn's disease.    Limitations  House hold activities;Writing;Lifting    Patient Stated Goals  Increase L UE ROM and decrease pain with ADLs    Currently in Pain?  Yes    Pain Score  3     Pain  Location  Shoulder    Pain Orientation  Right    Pain Descriptors / Indicators  Aching;Constant         There.ex.:  B UBE1mn. F/b Seated wand ex.: AAROM sh. Flexion/ chest press/ biceps 20x (mirror feedback).    Nautilus: wand lat. Pull downs 30#/ tricep extension 20#/ scap. Retraction 30#/ bicep curls 10# 20x each. Standing 2nd shelf reaching with R UE (cones)- cuing to avoid R UT/ heel raises to compensate.   Seated R shoulder A/AROM press outs/ sh. Abduction 10x2 with PT assist. Seated R shoulder IR/ER A/AROM 15x each.  Pain limited with sh. ER.   Discussed HEP   PT Short Term Goals - 01/02/19 0957      PT SHORT TERM GOAL #1   Title  Pt will demonstrate >100 degrees of PROM flexion/ abduction of R UE to demonstrate increased ROM with decreased guarding.    Baseline  R UE PROM flexion: 95, abduction: 85 with significant painful guarding 12/01/2018.  On 11/11, R sh. flexion/ abduction PROM grossly 100 deg. (pain limited)    Time  4    Period  Weeks    Status  Achieved    Target Date  12/31/18        PT Long Term Goals - 01/02/19 0958      PT LONG TERM GOAL #1   Title  Pt will increase FOTO score to 56 to demonstrate increased functional mobility.    Baseline  FOTO score 32 12/01/2018    Time  4    Period  Weeks    Status  On-going    Target Date  01/28/19      PT LONG TERM GOAL #2   Title  Pt will [facility] to demonstrate increased UE mobility.    Baseline  R UE PROM grossly limited 2/2 pain (flexion: 95, abd: 85, ER: 20) 12/01/2018    Time  4    Period  Weeks    Status  Not Met    Target Date  01/28/19      PT LONG TERM GOAL #3   Title  Pt will report <4/10 R shoulder pain at worst with movement to indicate decreased pain with activity.    Baseline  7/10 worst R shoulder pain with R UE movement 12/01/2018.  On 11/11, persistent 4/10 R sh. pain with increase pain during overhead reaching    Time  4    Period  Weeks     Status  Partially Met    Target Date  01/28/19      PT LONG TERM GOAL #4   Title  Pt will be able to reach across body without pain for bathing and dressing.    Baseline  Horizontal abduction limited and significantly painful  12/01/2018    Time  4    Period  Weeks    Status  Partially Met    Target Date  01/28/19            Plan - 01/05/19 1233    Clinical Impression Statement  Pt. working hard on increasing R shoulder AROM in seated/ standing posture.  R shoulder joint stiffness remains limited during Nautilus ex. program.  No manual tx. today with focus on R sh. ROM/ strengthening and added functional reaching at 2nd shelf.  Pt. scheduled to return to PT next Monday and will continue with HEP.    Personal Factors and Comorbidities  Age;Comorbidity 3+    Comorbidities  CAD, hyperlipidemia, DM2, Crohn's disease    Examination-Activity Limitations  Bed Mobility;Carry;Bathing;Dressing;Hygiene/Grooming;Lift;Reach Overhead    Stability/Clinical Decision Making  Evolving/Moderate complexity    Clinical Decision Making  Moderate    Rehab Potential  Good    PT Frequency  2x / week    PT Duration  4 weeks    PT Treatment/Interventions  Moist Heat;Cryotherapy;Electrical Stimulation;Functional mobility training;Therapeutic activities;Therapeutic exercise;Manual techniques;Patient/family education;Passive range of motion;ADLs/Self Care Home Management;Ultrasound;Neuromuscular re-education    PT Next Visit Plan  Progress R sh. AROM/ strengthening.    PT Home Exercise Plan  see HEP    Consulted and Agree with Plan of Care  Patient       Patient will benefit from skilled therapeutic intervention in order to improve the following deficits and impairments:  Improper body mechanics, Pain, Postural dysfunction, Decreased mobility, Decreased activity tolerance, Decreased endurance, Decreased range of motion, Decreased strength, Hypomobility, Impaired UE functional use, Impaired  flexibility  Visit Diagnosis: S/P right shoulder surgery  Joint stiffness  Muscle weakness (generalized)  Abnormal posture  Acute pain of right shoulder  Arthritis of right shoulder region     Problem List Patient Active Problem List   Diagnosis Date Noted  . Stable angina ([place]) 04/03/2018  . Right groin pain 06/22/2016  . Acute bronchitis 05/01/2016  . Diastasis recti 10/29/2015  . Rectal bleeding 06/10/2014  . AP (abdominal pain) 12/29/2013  . Crohn's disease ([place]) 12/18/2013   Pura Spice, PT, DPT # [id number] 01/05/2019, 12:39 PM  [facility] [facility] [address] [place], [place], [address] Phone: [phone]   Fax:  [phone]  Name: Joanna Ortiz MRN: [id number] Date of Birth: 07-Jul-1945

## 2019-01-07 ENCOUNTER — Encounter: Payer: Federal, State, Local not specified - PPO | Admitting: Physical Therapy

## 2019-01-12 ENCOUNTER — Ambulatory Visit: Payer: Medicare Other | Admitting: Physical Therapy

## 2019-01-17 IMAGING — CT CT HEAD W/O CM
3 of 4 series · 16 of 47 positions shown, 19 images · non-contrast
Comparison: Brain MRI and head CT 09/04/2014.

CLINICAL DATA: Left temporal and occipital headache beginning
today.

EXAM:
CT HEAD WITHOUT CONTRAST
TECHNIQUE: Contiguous axial images were obtained from the base of the skull
through the vertex without intravenous contrast.

[Series 2: head wo · axial · 0.39mm/px · z∈[-126,-6]mm · 10 of 29 slices shown, 13 images]
[im 3/29  brain]
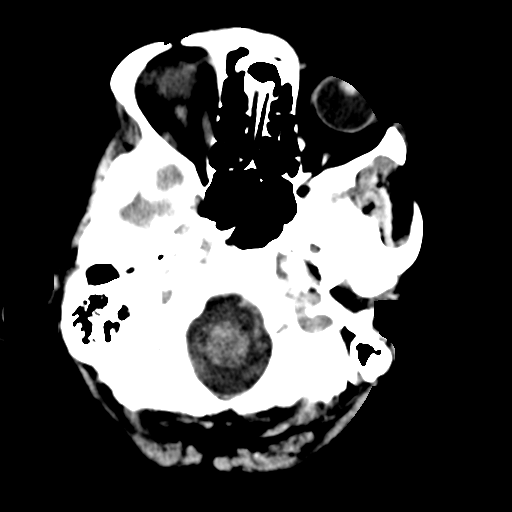
[im 3/29  bone]
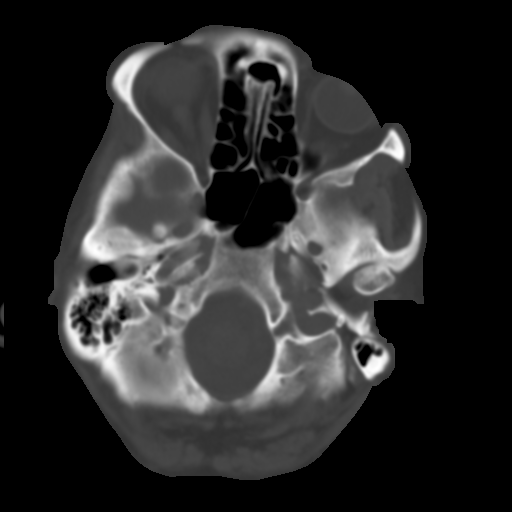
[im 5/29  brain]
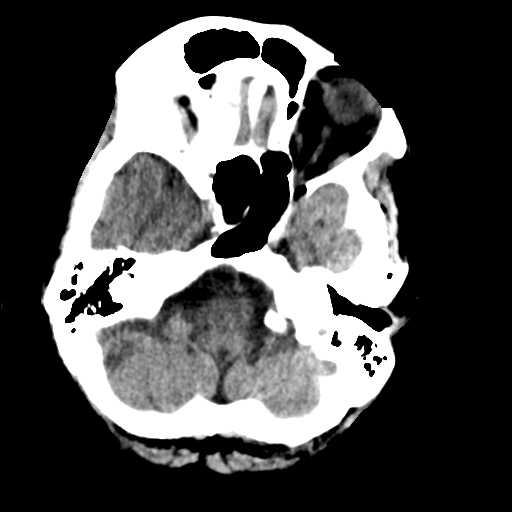
[im 9/29  brain]
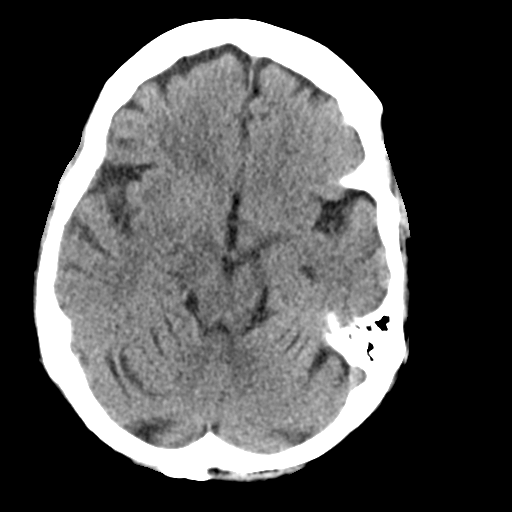
[im 11/29  brain]
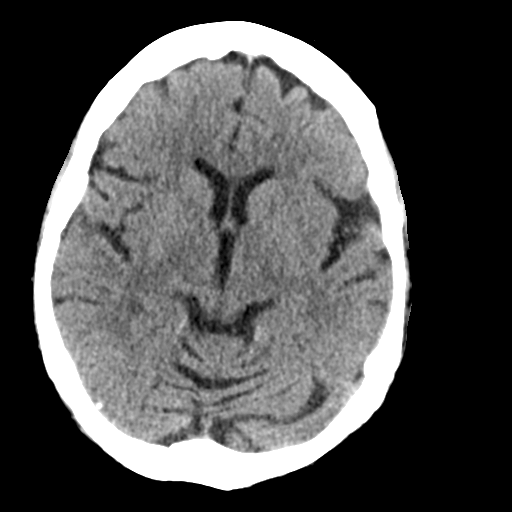
[im 13/29  brain]
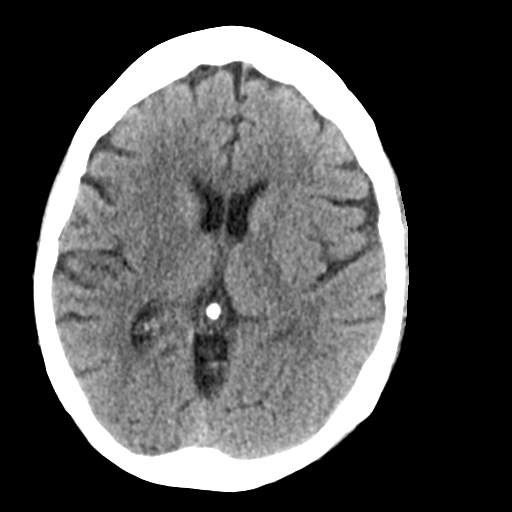
[im 13/29  bone]
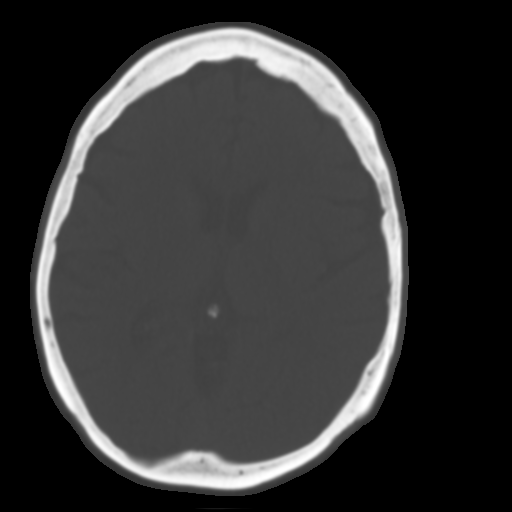
[im 17/29  brain]
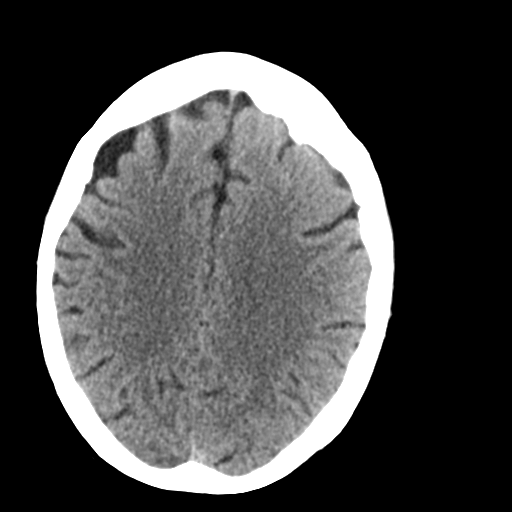
[im 19/29  brain]
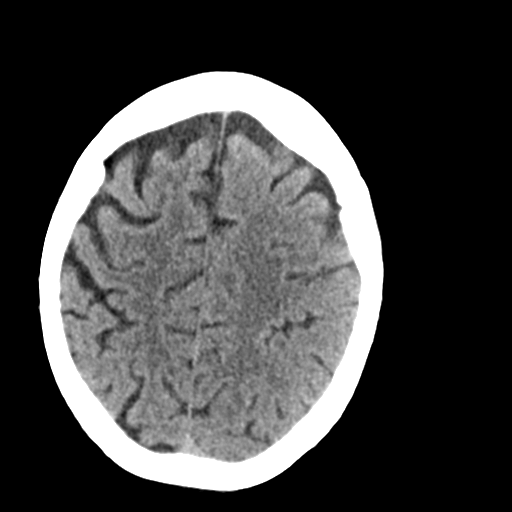
[im 21/29  brain]
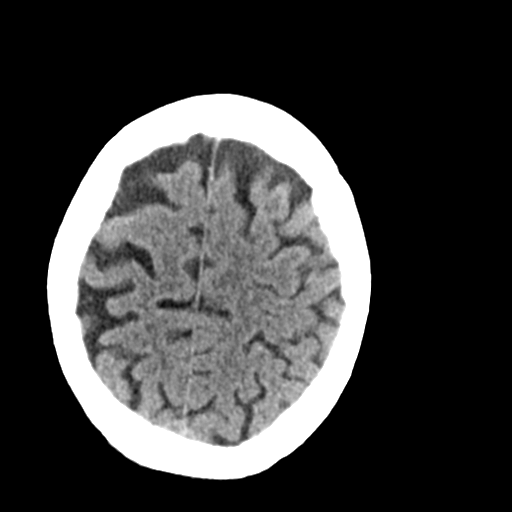
[im 25/29  brain]
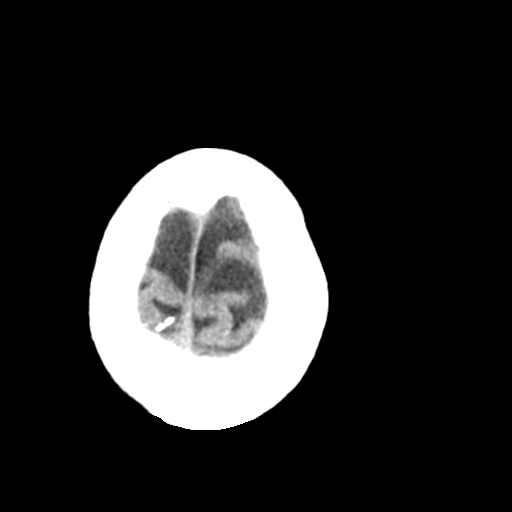
[im 25/29  bone]
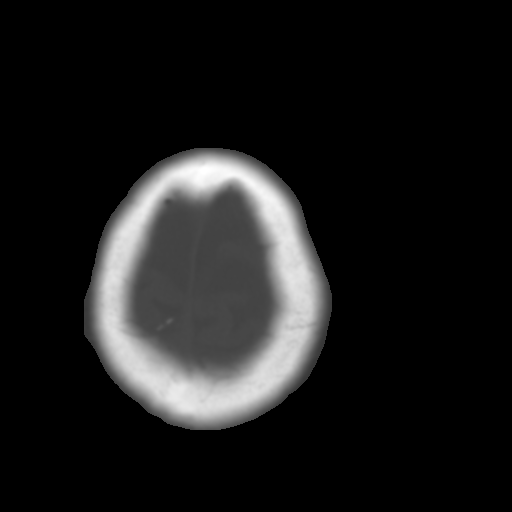
[im 27/29  brain]
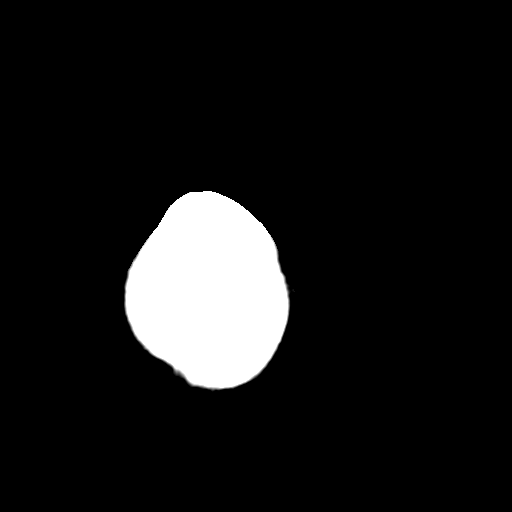

[Series 4: coronal soft tissue · coronal · 0.34mm/px · 3 of 63 slices shown]
[im 21/63  brain]
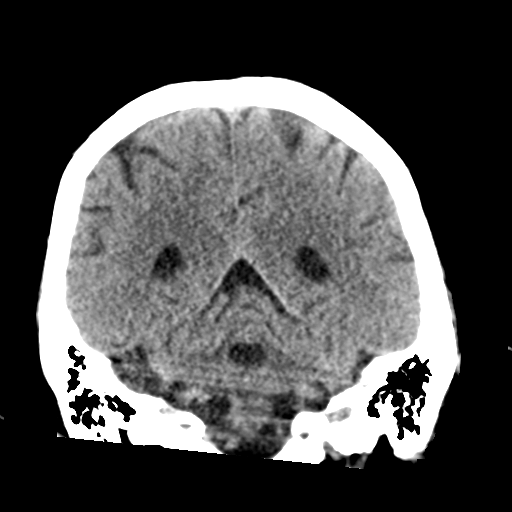
[im 28/63  brain]
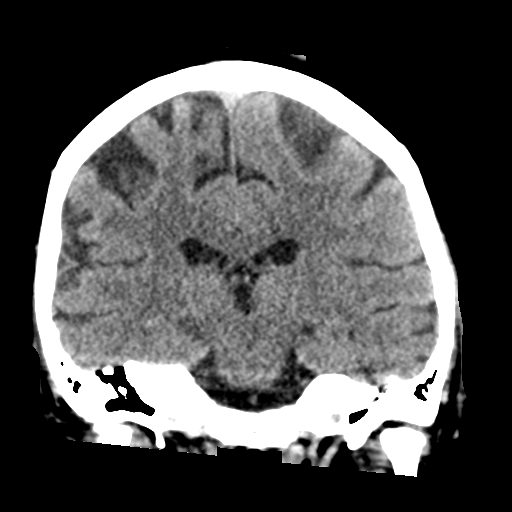
[im 35/63  brain]
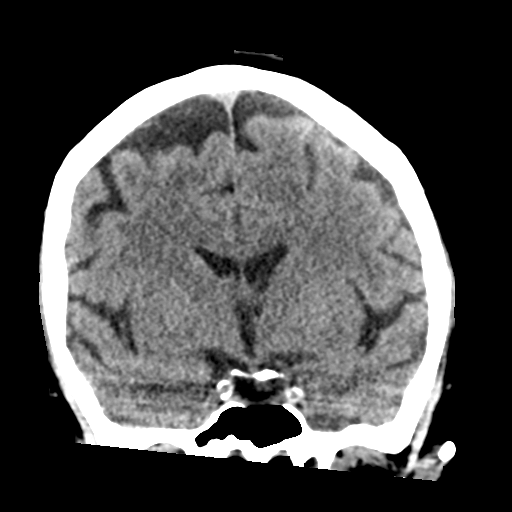

[Series 5: sagittal soft tissue · sagittal · 0.32mm/px · 3 of 49 slices shown]
[im 17/49  brain]
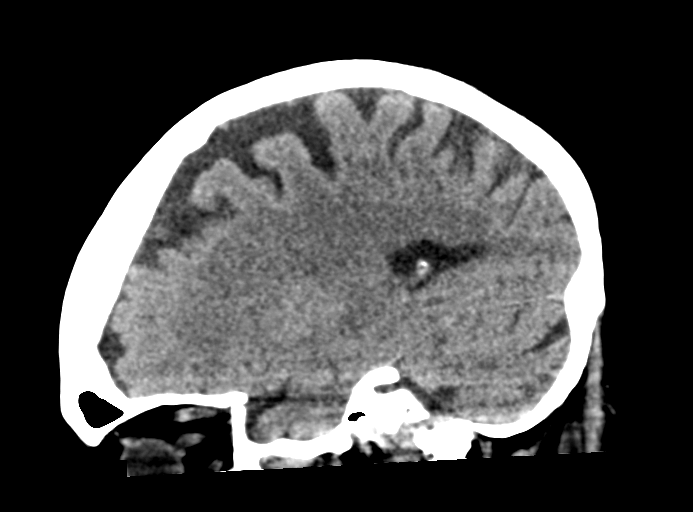
[im 25/49  brain]
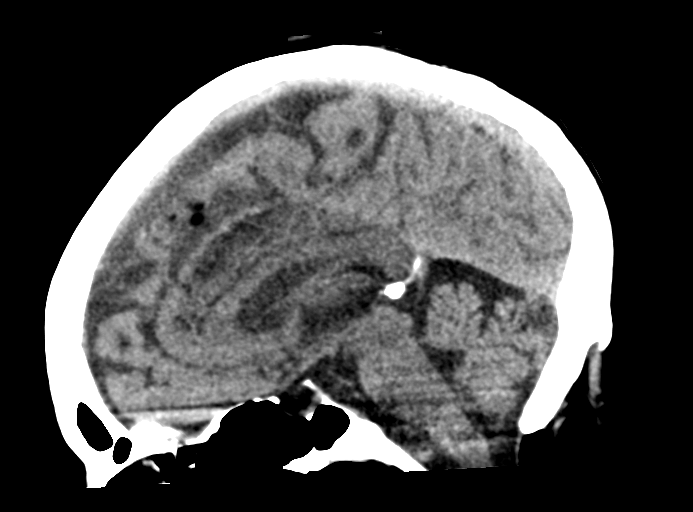
[im 33/49  brain]
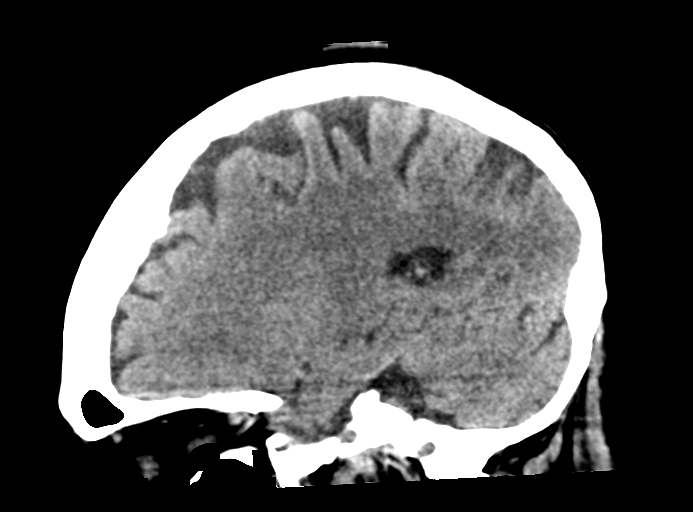

[16 of 47 positions shown; findings below may reference images not displayed]

FINDINGS: Brain: There is some cortical atrophy. No evidence of acute
abnormality including hemorrhage, infarct, mass lesion, mass effect,
midline shift or abnormal extra-axial fluid collections identified.
No hydrocephalus or pneumocephalus.

Vascular: Atherosclerosis noted.

Skull: Intact.

Sinuses/Orbits: Negative.

Other: None.
IMPRESSION: No acute abnormality.

Mild atrophy.

Atherosclerosis.

## 2019-01-19 ENCOUNTER — Encounter: Payer: Federal, State, Local not specified - PPO | Admitting: Physical Therapy

## 2019-01-20 ENCOUNTER — Ambulatory Visit: Payer: Federal, State, Local not specified - PPO | Attending: Orthopedic Surgery | Admitting: Physical Therapy

## 2019-01-20 ENCOUNTER — Other Ambulatory Visit: Payer: Self-pay

## 2019-01-20 ENCOUNTER — Encounter: Payer: Self-pay | Admitting: Physical Therapy

## 2019-01-20 DIAGNOSIS — M2560 Stiffness of unspecified joint, not elsewhere classified: Secondary | ICD-10-CM | POA: Insufficient documentation

## 2019-01-20 DIAGNOSIS — Z9889 Other specified postprocedural states: Secondary | ICD-10-CM | POA: Insufficient documentation

## 2019-01-20 DIAGNOSIS — M25511 Pain in right shoulder: Secondary | ICD-10-CM | POA: Diagnosis present

## 2019-01-20 DIAGNOSIS — R293 Abnormal posture: Secondary | ICD-10-CM | POA: Diagnosis present

## 2019-01-20 DIAGNOSIS — M6281 Muscle weakness (generalized): Secondary | ICD-10-CM | POA: Insufficient documentation

## 2019-01-20 DIAGNOSIS — M19011 Primary osteoarthritis, right shoulder: Secondary | ICD-10-CM | POA: Diagnosis present

## 2019-01-20 NOTE — Therapy (Signed)
[facility] [address] [place], [place], [address] Phone: [phone]   Fax:  [phone]  Physical Therapy Treatment  Patient Details  Name: Joanna Ortiz MRN: [id number] Date of Birth: 03-19-1945 Referring Provider (PT): Dr. Lennox Solders, MD   Encounter Date: 01/20/2019  PT End of Session - 01/20/19 0755    Visit Number  12    Number of Visits  16    Date for PT Re-Evaluation  01/28/19    Authorization - Visit Number  3    Authorization - Number of Visits  10    PT Start Time  0807    PT Stop Time  0852    PT Time Calculation (min)  45 min    Activity Tolerance  Patient tolerated treatment well;Patient limited by pain    Behavior During Therapy  [facility] for tasks assessed/performed       Past Medical History:  Diagnosis Date  . Anemia   . Arthritis   . Collagen vascular disease ([place])   . Coronary artery disease   . Crohn disease ([place])   . Diabetes mellitus without complication (HCC)    diet controlled  . Hyperlipidemia   . Hypothyroidism   . Pneumonia    3/18  . Thyroid disease    goiter    Past Surgical History:  Procedure Laterality Date  . ABDOMINAL HYSTERECTOMY  1987  . APPENDECTOMY    . BREAST BIOPSY Bilateral    neg  . CATARACT EXTRACTION W/PHACO Right 11/06/2016   Procedure: CATARACT EXTRACTION PHACO AND INTRAOCULAR LENS PLACEMENT (IOC);  Surgeon: Birder Robson, MD;  Location: [facility] ORS;  Service: Ophthalmology;  Laterality: Right;  [place] 00:42.6 AP% 16.7 CDE 7.14 Fluid Pack Lot # [id number]  . CATARACT EXTRACTION W/PHACO Left 12/25/2016   Procedure: CATARACT EXTRACTION PHACO AND INTRAOCULAR LENS PLACEMENT (IOC);  Surgeon: Birder Robson, MD;  Location: [facility] ORS;  Service: Ophthalmology;  Laterality: Left;  [place] 00:32 AP% 13.7 CDE 4.40 Fluid pack lot # [id number]  . COLON SURGERY  06/06/2011   Resection of terminal ileum and previous ileocolonic anastomosis for recurrent Crohn's disease. 3 focal  strictures identified.  . COLONOSCOPY  2013  . JOINT REPLACEMENT Left    SHOULDER  . LEFT HEART CATH AND CORONARY ANGIOGRAPHY Left 04/04/2018   Procedure: LEFT HEART CATH AND CORONARY ANGIOGRAPHY;  Surgeon: Corey Skains, MD;  Location: [place] [facility];  Service: Cardiovascular;  Laterality: Left;  . SHOULDER SURGERY Right 11/20/2018   Procedure: R SHOULDER ARTHROSCOPY: biceps tenomyotomy, distal claviculectomy, acromioplasty, posterior capsule release, debridement, decrompression of subacromial space, coracoacromial release, bursectomy  . SMALL INTESTINE SURGERY  2012  . THYROID SURGERY  1992  . TONSILLECTOMY      There were no vitals filed for this visit.  Subjective Assessment - 01/20/19 0755    Subjective  Pt. had f/u with Dr. Jeannie Fend and wants pt. to continue with exercises.  Pt. reports 1/10 R shoulder pain prior to tx. session and reports stiffness in shoulder joint.    Pertinent History  Pt hx of progressive R shoulder pain and stiffness.  Received steroid injections in June that provided significant pain relief.  However, after a visit with her chiropractor for some left-sided neck pain in July, she reported the return of R shoulder pain with increased "catching" and dull aching (no trauma reported).  Pt was dx with a labral tear of the long head of biceps tendon of the R UE.  Pt  is presenting to therapy s/p R shoulder arthroscopy on 11/20/2018 (biceps tenomyotomy, debridement, subacromial decompression, capsule release, bursectomy).  Pt c/c L shoulder constant, aching pain (best: 2/10 with ice, rest and meds; worst: 7/10 with using arm, reaching across body; worse in the morning).  Pt hx of L TSA several years ago, R UE arthritis, CAD, hyperlipidemia, DM2, Crohn's disease.    Limitations  House hold activities;Writing;Lifting    Patient Stated Goals  Increase L UE ROM and decrease pain with ADLs    Currently in Pain?  Yes    Pain Score  1     Pain Location  Shoulder    Pain  Orientation  Right    Pain Descriptors / Indicators  Aching        There.ex.:  B UBE8mn. F/b Nautilus: handles lat. Pull downs 30#/ tricep extension 20#/ sh. Adduction 10#/ scap. Retraction 30#/ bicep curls 10# 20x each. Issued RTB ex. (see handouts).  Sh. Extension/ scap. Retraction/ bicep curls/ tricep ext./ horizontal abduction (modified)   Seated R shoulder A/AROM press outs/ sh. Abduction 10x2 with PT assist.     PT Short Term Goals - 01/02/19 0957      PT SHORT TERM GOAL #1   Title  Pt will demonstrate >100 degrees of PROM flexion/ abduction of R UE to demonstrate increased ROM with decreased guarding.    Baseline  R UE PROM flexion: 95, abduction: 85 with significant painful guarding 12/01/2018.  On 11/11, R sh. flexion/ abduction PROM grossly 100 deg. (pain limited)    Time  4    Period  Weeks    Status  Achieved    Target Date  12/31/18        PT Long Term Goals - 01/02/19 0958      PT LONG TERM GOAL #1   Title  Pt will increase FOTO score to 56 to demonstrate increased functional mobility.    Baseline  FOTO score 32 12/01/2018    Time  4    Period  Weeks    Status  On-going    Target Date  01/28/19      PT LONG TERM GOAL #2   Title  Pt will demonstrate [facility] to demonstrate increased UE mobility.    Baseline  R UE PROM grossly limited 2/2 pain (flexion: 95, abd: 85, ER: 20) 12/01/2018    Time  4    Period  Weeks    Status  Not Met    Target Date  01/28/19      PT LONG TERM GOAL #3   Title  Pt will report <4/10 R shoulder pain at worst with movement to indicate decreased pain with activity.    Baseline  7/10 worst R shoulder pain with R UE movement 12/01/2018.  On 11/11, persistent 4/10 R sh. pain with increase pain during overhead reaching    Time  4    Period  Weeks    Status  Partially Met    Target Date  01/28/19      PT LONG TERM GOAL #4   Title  Pt will be able to reach across body without pain for bathing  and dressing.    Baseline  Horizontal abduction limited and significantly painful 12/01/2018    Time  4    Period  Weeks    Status  Partially Met    Target Date  01/28/19  Plan - 01/20/19 0755    Clinical Impression Statement  R shoulder joint stiffness noted with overhead/ functional reaching during tx.  Pt. issued a resisted ther.ex. program to focus on posterior deltoid/ scapular/ biceps and triceps strengthening.  Pt. will complete resisted ther.ex. 3-4x/ week and R sh. ROM/ stretches daily.    Personal Factors and Comorbidities  Age;Comorbidity 3+    Comorbidities  CAD, hyperlipidemia, DM2, Crohn's disease    Examination-Activity Limitations  Bed Mobility;Carry;Bathing;Dressing;Hygiene/Grooming;Lift;Reach Overhead    Stability/Clinical Decision Making  Evolving/Moderate complexity    Clinical Decision Making  Moderate    Rehab Potential  Good    PT Frequency  2x / week    PT Duration  4 weeks    PT Treatment/Interventions  Moist Heat;Cryotherapy;Electrical Stimulation;Functional mobility training;Therapeutic activities;Therapeutic exercise;Manual techniques;Patient/family education;Passive range of motion;ADLs/Self Care Home Management;Ultrasound;Neuromuscular re-education    PT Next Visit Plan  Progress R sh. AROM/ strengthening.   Check schedule next tx. session/ determine Recert vs. Discharge.    PT Home Exercise Plan  see HEP    Consulted and Agree with Plan of Care  Patient       Patient will benefit from skilled therapeutic intervention in order to improve the following deficits and impairments:  Improper body mechanics, Pain, Postural dysfunction, Decreased mobility, Decreased activity tolerance, Decreased endurance, Decreased range of motion, Decreased strength, Hypomobility, Impaired UE functional use, Impaired flexibility  Visit Diagnosis: S/P right shoulder surgery  Joint stiffness  Muscle weakness (generalized)  Abnormal posture  Acute pain of  right shoulder  Arthritis of right shoulder region     Problem List Patient Active Problem List   Diagnosis Date Noted  . Stable angina ([place]) 04/03/2018  . Right groin pain 06/22/2016  . Acute bronchitis 05/01/2016  . Diastasis recti 10/29/2015  . Rectal bleeding 06/10/2014  . AP (abdominal pain) 12/29/2013  . Crohn's disease ([place]) 12/18/2013   Pura Spice, PT, DPT # [phone] 01/20/2019, 1:35 PM  [facility] [address] [place], [place], [address] Phone: [phone]   Fax:  [phone]  Name: NAKIYA RALLIS MRN: [id number] Date of Birth: July 30, 1945

## 2019-01-20 NOTE — Patient Instructions (Signed)
Access Code: [email or web address]  URL: [email or web address]  Date: 01/20/2019  Prepared by: Dorcas Carrow   Exercises  Standing Single Arm Elbow Flexion with Resistance - 20 reps - 1 sets - 1x daily - 3x weekly  Scapular Retraction with Resistance - 20 reps - 1 sets - 1x daily - 3x weekly  Standing Tricep Extensions with Resistance - 20 reps - 1 sets - 1x daily - 3x weekly  Seated Shoulder Horizontal Abduction with Resistance - 20 reps - 1 sets - 1x daily - 3x weekly

## 2019-01-21 ENCOUNTER — Encounter: Payer: Federal, State, Local not specified - PPO | Admitting: Physical Therapy

## 2019-01-26 ENCOUNTER — Other Ambulatory Visit: Payer: Self-pay

## 2019-01-26 ENCOUNTER — Encounter: Payer: Self-pay | Admitting: Physical Therapy

## 2019-01-26 ENCOUNTER — Ambulatory Visit: Payer: Federal, State, Local not specified - PPO | Admitting: Physical Therapy

## 2019-01-26 DIAGNOSIS — Z9889 Other specified postprocedural states: Secondary | ICD-10-CM | POA: Diagnosis not present

## 2019-01-26 DIAGNOSIS — M6281 Muscle weakness (generalized): Secondary | ICD-10-CM

## 2019-01-26 DIAGNOSIS — M25511 Pain in right shoulder: Secondary | ICD-10-CM

## 2019-01-26 DIAGNOSIS — R293 Abnormal posture: Secondary | ICD-10-CM

## 2019-01-26 DIAGNOSIS — M2560 Stiffness of unspecified joint, not elsewhere classified: Secondary | ICD-10-CM

## 2019-01-26 DIAGNOSIS — M19011 Primary osteoarthritis, right shoulder: Secondary | ICD-10-CM

## 2019-01-26 NOTE — Therapy (Signed)
[facility] [address]. [place], [place], [address] Phone: [phone]   Fax:  [phone]  Physical Therapy Treatment  Patient Details  Name: Joanna Ortiz MRN: [id number] Date of Birth: Oct 27, 1945 Referring Provider (PT): Dr. Lennox Solders, MD   Encounter Date: 01/26/2019  PT End of Session - 01/26/19 1027    Visit Number  13    Number of Visits  16    Date for PT Re-Evaluation  01/28/19    Authorization - Visit Number  4    Authorization - Number of Visits  10    PT Start Time  1024    PT Stop Time  1110    PT Time Calculation (min)  46 min    Activity Tolerance  Patient tolerated treatment well;Patient limited by pain    Behavior During Therapy  [facility] for tasks assessed/performed       Past Medical History:  Diagnosis Date  . Anemia   . Arthritis   . Collagen vascular disease ([place])   . Coronary artery disease   . Crohn disease ([place])   . Diabetes mellitus without complication (HCC)    diet controlled  . Hyperlipidemia   . Hypothyroidism   . Pneumonia    3/18  . Thyroid disease    goiter    Past Surgical History:  Procedure Laterality Date  . ABDOMINAL HYSTERECTOMY  1987  . APPENDECTOMY    . BREAST BIOPSY Bilateral    neg  . CATARACT EXTRACTION W/PHACO Right 11/06/2016   Procedure: CATARACT EXTRACTION PHACO AND INTRAOCULAR LENS PLACEMENT (IOC);  Surgeon: Birder Robson, MD;  Location: [facility] ORS;  Service: Ophthalmology;  Laterality: Right;  [place] 00:42.6 AP% 16.7 CDE 7.14 Fluid Pack Lot # [id number]  . CATARACT EXTRACTION W/PHACO Left 12/25/2016   Procedure: CATARACT EXTRACTION PHACO AND INTRAOCULAR LENS PLACEMENT (IOC);  Surgeon: Birder Robson, MD;  Location: [facility] OR[facility];  Service: Ophthalmology;  Laterality: Left;  [place] 00:32 AP% 13.7 CDE 4.40 Fluid pack lot # [id number] H  . COLON SURGERY  06/06/2011   Resection of terminal ileum and previous ileocolonic anastomosis for recurrent Crohn's disease. 3 focal  strictures identified.  . COLONOSCOPY  2013  . JOINT REPLACEMENT Left    SHOULDER  . LEFT HEART CATH AND CORONARY ANGIOGRAPHY Left 04/04/2018   Procedure: LEFT HEART CATH AND CORONARY ANGIOGRAPHY;  Surgeon: Corey Skains, MD;  Location: [place] [facility];  Service: Cardiovascular;  Laterality: Left;  . SHOULDER SURGERY Right 11/20/2018   Procedure: R SHOULDER ARTHROSCOPY: biceps tenomyotomy, distal claviculectomy, acromioplasty, posterior capsule release, debridement, decrompression of subacromial space, coracoacromial release, bursectomy  . SMALL INTESTINE SURGERY  2012  . THYROID SURGERY  1992  . TONSILLECTOMY      There were no vitals filed for this visit.  Subjective Assessment - 01/26/19 1028    Subjective  Pt. reports achiness in R shoulder, not pain this morning.  Pt. doing HEP with no issues and last complete strengthening ex. on Sunday.    Pertinent History  Pt hx of progressive R shoulder pain and stiffness.  Received steroid injections in June that provided significant pain relief.  However, after a visit with her chiropractor for some left-sided neck pain in July, she reported the return of R shoulder pain with increased "catching" and dull aching (no trauma reported).  Pt was dx with a labral tear of the long head of biceps tendon of the R UE.  Pt is presenting to therapy s/p R  shoulder arthroscopy on 11/20/2018 (biceps tenomyotomy, debridement, subacromial decompression, capsule release, bursectomy).  Pt c/c L shoulder constant, aching pain (best: 2/10 with ice, rest and meds; worst: 7/10 with using arm, reaching across body; worse in the morning).  Pt hx of L TSA several years ago, R UE arthritis, CAD, hyperlipidemia, DM2, Crohn's disease.    Limitations  House hold activities;Writing;Lifting    Patient Stated Goals  Increase L UE ROM and decrease pain with ADLs    Currently in Pain?  No/denies         There.ex.:  B UBE70mn. F/b.   Warm-up Standing R shoulder AROM  (all planes).   Nautilus: handles lat. Pull downs 30#/ tricep extension 20#/ sh. Adduction 10#/scap. Retraction 30# 20x each. Discussed RTB ex. (see handouts).  Seated 2# sh. Punches/ bicep curls/ ER 20x 2nd shelf overhead reaching 10x2 with cones  Manual tx.:  Seated R shoulder A/AROM press outs/ sh. Abduction 10x2 with PT assist. Supine R shoulder AA/PROM (all planes) as tolerated. STM to R shoulder/biceps/ triceps       PT Short Term Goals - 01/02/19 0957      PT SHORT TERM GOAL #1   Title  Pt will demonstrate >100 degrees of PROM flexion/ abduction of R UE to demonstrate increased ROM with decreased guarding.    Baseline  R UE PROM flexion: 95, abduction: 85 with significant painful guarding 12/01/2018.  On 11/11, R sh. flexion/ abduction PROM grossly 100 deg. (pain limited)    Time  4    Period  Weeks    Status  Achieved    Target Date  12/31/18        PT Long Term Goals - 01/02/19 0958      PT LONG TERM GOAL #1   Title  Pt will increase FOTO score to 56 to demonstrate increased functional mobility.    Baseline  FOTO score 32 12/01/2018    Time  4    Period  Weeks    Status  On-going    Target Date  01/28/19      PT LONG TERM GOAL #2   Title  Pt will demonstrate R UE A/AROM to [facility] compared to L UE to demonstrate increased UE mobility.    Baseline  R UE PROM grossly limited 2/2 pain (flexion: 95, abd: 85, ER: 20) 12/01/2018    Time  4    Period  Weeks    Status  Not Met    Target Date  01/28/19      PT LONG TERM GOAL #3   Title  Pt will report <4/10 R shoulder pain at worst with movement to indicate decreased pain with activity.    Baseline  7/10 worst R shoulder pain with R UE movement 12/01/2018.  On 11/11, persistent 4/10 R sh. pain with increase pain during overhead reaching    Time  4    Period  Weeks    Status  Partially Met    Target Date  01/28/19      PT LONG TERM GOAL #4   Title  Pt will be able to reach across body without pain for bathing  and dressing.    Baseline  Horizontal abduction limited and significantly painful 12/01/2018    Time  4    Period  Weeks    Status  Partially Met    Target Date  01/28/19         Plan - 01/26/19 1029    Clinical  Impression Statement  Pt. able to reach overhead to 2nd shelf today (74") with no compensatory movement patterns.  R shoulder strengthening progressing well in available range.  R shoulder flexion/ abduction remains limited as compared to L.  Minimal c/o R shoulder/ proximal biceps tenderness and pt. able to roll onto R shoulder when going from supine to sitting.  Pt. instructed to avoid any strengthening ex. prior to next tx. session on Wednesday.    Personal Factors and Comorbidities  Age;Comorbidity 3+    Comorbidities  CAD, hyperlipidemia, DM2, Crohn's disease    Examination-Activity Limitations  Bed Mobility;Carry;Bathing;Dressing;Hygiene/Grooming;Lift;Reach Overhead    Stability/Clinical Decision Making  Evolving/Moderate complexity    Clinical Decision Making  Moderate    Rehab Potential  Good    PT Frequency  2x / week    PT Duration  4 weeks    PT Treatment/Interventions  Moist Heat;Cryotherapy;Electrical Stimulation;Functional mobility training;Therapeutic activities;Therapeutic exercise;Manual techniques;Patient/family education;Passive range of motion;ADLs/Self Care Home Management;Ultrasound;Neuromuscular re-education    PT Next Visit Plan  Progress R sh. AROM/ strengthening.   Check schedule next tx. session/ determine Recert vs. Discharge.  Reassess ROM/ MMT    PT Home Exercise Plan  see HEP    Consulted and Agree with Plan of Care  Patient       Patient will benefit from skilled therapeutic intervention in order to improve the following deficits and impairments:  Improper body mechanics, Pain, Postural dysfunction, Decreased mobility, Decreased activity tolerance, Decreased endurance, Decreased range of motion, Decreased strength, Hypomobility, Impaired UE  functional use, Impaired flexibility  Visit Diagnosis: S/P right shoulder surgery  Joint stiffness  Muscle weakness (generalized)  Abnormal posture  Acute pain of right shoulder  Arthritis of right shoulder region     Problem List Patient Active Problem List   Diagnosis Date Noted  . Stable angina ([place]) 04/03/2018  . Right groin pain 06/22/2016  . Acute bronchitis 05/01/2016  . Diastasis recti 10/29/2015  . Rectal bleeding 06/10/2014  . AP (abdominal pain) 12/29/2013  . Crohn's disease ([place]) 12/18/2013   Pura Spice, PT, DPT # [phone] 01/26/2019, 6:51 PM  [place] [facility] [address] [place], [place], [address] Phone: [phone]   Fax:  [phone]  Name: Joanna Ortiz MRN: [id number] Date of Birth: 07-Jul-1945

## 2019-01-28 ENCOUNTER — Ambulatory Visit: Payer: Federal, State, Local not specified - PPO | Admitting: Physical Therapy

## 2019-01-28 ENCOUNTER — Other Ambulatory Visit: Payer: Self-pay

## 2019-01-28 ENCOUNTER — Encounter: Payer: Self-pay | Admitting: Physical Therapy

## 2019-01-28 DIAGNOSIS — M2560 Stiffness of unspecified joint, not elsewhere classified: Secondary | ICD-10-CM

## 2019-01-28 DIAGNOSIS — M6281 Muscle weakness (generalized): Secondary | ICD-10-CM

## 2019-01-28 DIAGNOSIS — Z9889 Other specified postprocedural states: Secondary | ICD-10-CM

## 2019-01-28 DIAGNOSIS — M19011 Primary osteoarthritis, right shoulder: Secondary | ICD-10-CM

## 2019-01-28 DIAGNOSIS — M25511 Pain in right shoulder: Secondary | ICD-10-CM

## 2019-01-28 DIAGNOSIS — R293 Abnormal posture: Secondary | ICD-10-CM

## 2019-01-28 NOTE — Therapy (Signed)
[facility] [address]. [place], [place], [address] Phone: [phone]   Fax:  [phone]  Physical Therapy Treatment  Patient Details  Name: Joanna Ortiz MRN: [id number] Date of Birth: 1945/08/23 Referring Provider (PT): Dr. Lennox Solders, MD   Encounter Date: 01/28/2019  PT End of Session - 01/31/19 1556    Visit Number  14    Number of Visits  16    Date for PT Re-Evaluation  01/28/19    Authorization - Visit Number  5    Authorization - Number of Visits  10    PT Start Time  1027    PT Stop Time  1120    PT Time Calculation (min)  53 min    Activity Tolerance  Patient tolerated treatment well;Patient limited by pain    Behavior During Therapy  [facility] for tasks assessed/performed       Past Medical History:  Diagnosis Date  . Anemia   . Arthritis   . Collagen vascular disease ([place])   . Coronary artery disease   . Crohn disease ([place])   . Diabetes mellitus without complication (HCC)    diet controlled  . Hyperlipidemia   . Hypothyroidism   . Pneumonia    3/18  . Thyroid disease    goiter    Past Surgical History:  Procedure Laterality Date  . ABDOMINAL HYSTERECTOMY  1987  . APPENDECTOMY    . BREAST BIOPSY Bilateral    neg  . CATARACT EXTRACTION W/PHACO Right 11/06/2016   Procedure: CATARACT EXTRACTION PHACO AND INTRAOCULAR LENS PLACEMENT (IOC);  Surgeon: Birder Robson, MD;  Location: [facility];  Service: Ophthalmology;  Laterality: Right;  [place] 00:42.6 AP% 16.7 CDE 7.14 Fluid Pack Lot # [id number]  . CATARACT EXTRACTION W/PHACO Left 12/25/2016   Procedure: CATARACT EXTRACTION PHACO AND INTRAOCULAR LENS PLACEMENT (IOC);  Surgeon: Birder Robson, MD;  Location: [facility];  Service: Ophthalmology;  Laterality: Left;  [place] 00:32 AP% 13.7 CDE 4.40 Fluid pack lot # [id number]  . COLON SURGERY  06/06/2011   Resection of terminal ileum and previous ileocolonic anastomosis for recurrent Crohn's disease. 3 focal  strictures identified.  . COLONOSCOPY  2013  . JOINT REPLACEMENT Left    SHOULDER  . LEFT HEART CATH AND CORONARY ANGIOGRAPHY Left 04/04/2018   Procedure: LEFT HEART CATH AND CORONARY ANGIOGRAPHY;  Surgeon: Corey Skains, MD;  Location: [facility];  Service: Cardiovascular;  Laterality: Left;  . SHOULDER SURGERY Right 11/20/2018   Procedure: R SHOULDER ARTHROSCOPY: biceps tenomyotomy, distal claviculectomy, acromioplasty, posterior capsule release, debridement, decrompression of subacromial space, coracoacromial release, bursectomy  . SMALL INTESTINE SURGERY  2012  . THYROID SURGERY  1992  . TONSILLECTOMY      There were no vitals filed for this visit.  Subjective Assessment - 01/31/19 1545    Subjective  Pt. reports having some nausea this morning and taking Phenergan.  Pt. did not have any Zofran.  Pt. states she may be dealing with a UTI.  No new shoulder issues reported.    Pertinent History  Pt hx of progressive R shoulder pain and stiffness.  Received steroid injections in June that provided significant pain relief.  However, after a visit with her chiropractor for some left-sided neck pain in July, she reported the return of R shoulder pain with increased "catching" and dull aching (no trauma reported).  Pt was dx with a labral tear of the long head of biceps tendon of the R  UE.  Pt is presenting to therapy s/p R shoulder arthroscopy on 11/20/2018 (biceps tenomyotomy, debridement, subacromial decompression, capsule release, bursectomy).  Pt c/c L shoulder constant, aching pain (best: 2/10 with ice, rest and meds; worst: 7/10 with using arm, reaching across body; worse in the morning).  Pt hx of L TSA several years ago, R UE arthritis, CAD, hyperlipidemia, DM2, Crohn's disease.    Limitations  House hold activities;Writing;Lifting    Patient Stated Goals  Increase L UE ROM and decrease pain with ADLs    Currently in Pain?  Yes    Pain Score  1     Pain Location  Shoulder     Pain Orientation  Right          There.ex.:  B [id number]. F/b.   Warm-up Standing R shoulder AROM (all planes).   Seated/standing wand ex. With mirror feedback.   Nautilus:handleslat. Pull downs 40#/ tricep extension 20#/ sh. Adduction 20#/scap. Retraction 30#/ IR and ER 10# 20x each. Discussed resisted HEP/ daily tasks  Manual tx.:  Seated R shoulder A/AROM press outs/ sh. Abduction 10x2 with PT assist. Seated STM to B UT/R deltoid/ biceps/ triceps musculature.       PT Short Term Goals - 01/02/19 0957      PT SHORT TERM GOAL #1   Title  Pt will demonstrate >100 degrees of PROM flexion/ abduction of R UE to demonstrate increased ROM with decreased guarding.    Baseline  R UE PROM flexion: 95, abduction: 85 with significant painful guarding 12/01/2018.  On 11/11, R sh. flexion/ abduction PROM grossly 100 deg. (pain limited)    Time  4    Period  Weeks    Status  Achieved    Target Date  12/31/18        PT Long Term Goals - 01/02/19 0958      PT LONG TERM GOAL #1   Title  Pt will increase FOTO score to 56 to demonstrate increased functional mobility.    Baseline  FOTO score 32 12/01/2018    Time  4    Period  Weeks    Status  On-going    Target Date  01/28/19      PT LONG TERM GOAL #2   Title  Pt will demonstrate R UE [facility] to L UE to demonstrate increased UE mobility.    Baseline  R UE PROM grossly limited 2/2 pain (flexion: 95, abd: 85, ER: 20) 12/01/2018    Time  4    Period  Weeks    Status  Not Met    Target Date  01/28/19      PT LONG TERM GOAL #3   Title  Pt will report <4/10 R shoulder pain at worst with movement to indicate decreased pain with activity.    Baseline  7/10 worst R shoulder pain with R UE movement 12/01/2018.  On 11/11, persistent 4/10 R sh. pain with increase pain during overhead reaching    Time  4    Period  Weeks    Status  Partially Met    Target Date  01/28/19      PT LONG TERM GOAL #4   Title  Pt  will be able to reach across body without pain for bathing and dressing.    Baseline  Horizontal abduction limited and significantly painful 12/01/2018    Time  4    Period  Weeks    Status  Partially Met  Target Date  01/28/19          Plan - 01/31/19 1557    Clinical Impression Statement  PT focus on R shoulder stability in available range of motion.  No worsening in nausea or discomfort as tx. progresses.  Pt. will continue with daily ROM/ stretches of R UE and complete resisted ther.ex. 3x/week.  Pt. benefits from recovery days to decrease inflammation/ discomfort.    Personal Factors and Comorbidities  Age;Comorbidity 3+    Comorbidities  CAD, hyperlipidemia, DM2, Crohn's disease    Examination-Activity Limitations  Bed Mobility;Carry;Bathing;Dressing;Hygiene/Grooming;Lift;Reach Overhead    Stability/Clinical Decision Making  Evolving/Moderate complexity    Clinical Decision Making  Moderate    Rehab Potential  Good    PT Frequency  2x / week    PT Duration  4 weeks    PT Treatment/Interventions  Moist Heat;Cryotherapy;Electrical Stimulation;Functional mobility training;Therapeutic activities;Therapeutic exercise;Manual techniques;Patient/family education;Passive range of motion;ADLs/Self Care Home Management;Ultrasound;Neuromuscular re-education    PT Next Visit Plan  Progress R sh. AROM/ strengthening.   Check schedule next tx. session/ determine Recert vs. Discharge.  Reassess ROM/ MMT    PT Home Exercise Plan  see HEP    Consulted and Agree with Plan of Care  Patient       Patient will benefit from skilled therapeutic intervention in order to improve the following deficits and impairments:  Improper body mechanics, Pain, Postural dysfunction, Decreased mobility, Decreased activity tolerance, Decreased endurance, Decreased range of motion, Decreased strength, Hypomobility, Impaired UE functional use, Impaired flexibility  Visit Diagnosis: S/P right shoulder surgery  Joint  stiffness  Muscle weakness (generalized)  Abnormal posture  Acute pain of right shoulder  Arthritis of right shoulder region     Problem List Patient Active Problem List   Diagnosis Date Noted  . Stable angina ([place]) 04/03/2018  . Right groin pain 06/22/2016  . Acute bronchitis 05/01/2016  . Diastasis recti 10/29/2015  . Rectal bleeding 06/10/2014  . AP (abdominal pain) 12/29/2013  . Crohn's disease ([place]) 12/18/2013   Pura Spice, PT, DPT # [phone] 01/31/2019, 4:01 PM  [facility] [address] [place], [place], [address] Phone: [phone]   Fax:  [phone]  Name: Joanna Ortiz MRN: [id number] Date of Birth: 07-07-1945

## 2019-02-02 ENCOUNTER — Ambulatory Visit: Payer: Federal, State, Local not specified - PPO | Admitting: Physical Therapy

## 2019-02-02 ENCOUNTER — Encounter: Payer: Self-pay | Admitting: Physical Therapy

## 2019-02-02 ENCOUNTER — Other Ambulatory Visit: Payer: Self-pay

## 2019-02-02 DIAGNOSIS — Z9889 Other specified postprocedural states: Secondary | ICD-10-CM | POA: Diagnosis not present

## 2019-02-02 DIAGNOSIS — M6281 Muscle weakness (generalized): Secondary | ICD-10-CM

## 2019-02-02 DIAGNOSIS — M25511 Pain in right shoulder: Secondary | ICD-10-CM

## 2019-02-02 DIAGNOSIS — M19011 Primary osteoarthritis, right shoulder: Secondary | ICD-10-CM

## 2019-02-02 DIAGNOSIS — R293 Abnormal posture: Secondary | ICD-10-CM

## 2019-02-02 DIAGNOSIS — M2560 Stiffness of unspecified joint, not elsewhere classified: Secondary | ICD-10-CM

## 2019-02-02 NOTE — Therapy (Signed)
[place] [address] [place], [place], [address] Phone: [phone]   Fax:  [phone]  Physical Therapy Treatment  Patient Details  Name: Joanna Ortiz MRN: [id number] Date of Birth: 06/15/1945 Referring Provider (PT): Dr. Alex Creighton, MD   Encounter Date: 02/02/2019  PT End of Session - 02/02/19 1235    Visit Number  15    Number of Visits  23    Date for PT Re-Evaluation  03/02/19    Authorization - Visit Number  1    Authorization - Number of Visits  10    PT Start Time  1022    PT Stop Time  1110    PT Time Calculation (min)  48 min    Activity Tolerance  Patient tolerated treatment well;Patient limited by pain    Behavior During Therapy  WFL for tasks assessed/performed       Past Medical History:  Diagnosis Date  . Anemia   . Arthritis   . Collagen vascular disease (HCC)   . Coronary artery disease   . Crohn disease (HCC)   . Diabetes mellitus without complication (HCC)    diet controlled  . Hyperlipidemia   . Hypothyroidism   . Pneumonia    3/18  . Thyroid disease    goiter    Past Surgical History:  Procedure Laterality Date  . ABDOMINAL HYSTERECTOMY  1987  . APPENDECTOMY    . BREAST BIOPSY Bilateral    neg  . CATARACT EXTRACTION W/PHACO Right 11/06/2016   Procedure: CATARACT EXTRACTION PHACO AND INTRAOCULAR LENS PLACEMENT (IOC);  Surgeon: Porfilio, William, MD;  Location: [facility];  Service: Ophthalmology;  Laterality: Right;  [place] 00:42.6 AP% 16.7 CDE 7.14 Fluid Pack Lot # [id number]  . CATARACT EXTRACTION W/PHACO Left 12/25/2016   Procedure: CATARACT EXTRACTION PHACO AND INTRAOCULAR LENS PLACEMENT (IOC);  Surgeon: Porfilio, William, MD;  Location: [facility];  Service: Ophthalmology;  Laterality: Left;  US 00:32 AP% 13.7 CDE 4.40 Fluid pack lot # [id number]  . COLON SURGERY  06/06/2011   Resection of terminal ileum and previous ileocolonic anastomosis for recurrent Crohn's disease. 3 focal  strictures identified.  . COLONOSCOPY  2013  . JOINT REPLACEMENT Left    SHOULDER  . LEFT HEART CATH AND CORONARY ANGIOGRAPHY Left 04/04/2018   Procedure: LEFT HEART CATH AND CORONARY ANGIOGRAPHY;  Surgeon: Kowalski, Bruce J, MD;  Location: [facility];  Service: Cardiovascular;  Laterality: Left;  . SHOULDER SURGERY Right 11/20/2018   Procedure: R SHOULDER ARTHROSCOPY: biceps tenomyotomy, distal claviculectomy, acromioplasty, posterior capsule release, debridement, decrompression of subacromial space, coracoacromial release, bursectomy  . SMALL INTESTINE SURGERY  2012  . THYROID SURGERY  1992  . TONSILLECTOMY      There were no vitals filed for this visit.  Subjective Assessment - 02/02/19 1038    Subjective  Pt. reports no R shoulder pain but has fatigue in R UE.  Pt. states she has a cold today.    Pertinent History  Pt hx of progressive R shoulder pain and stiffness.  Received steroid injections in June that provided significant pain relief.  However, after a visit with her chiropractor for some left-sided neck pain in July, she reported the return of R shoulder pain with increased "catching" and dull aching (no trauma reported).  Pt was dx with a labral tear of the long head of biceps tendon of the R UE.  Pt is presenting to therapy s/p R shoulder arthroscopy on 11/20/2018 (  biceps tenomyotomy, debridement, subacromial decompression, capsule release, bursectomy).  Pt c/c L shoulder constant, aching pain (best: 2/10 with ice, rest and meds; worst: 7/10 with using arm, reaching across body; worse in the morning).  Pt hx of L TSA several years ago, R UE arthritis, CAD, hyperlipidemia, DM2, Crohn's disease.    Limitations  House hold activities;Writing;Lifting    Patient Stated Goals  Increase L UE ROM and decrease pain with ADLs    Currently in Pain?  No/denies        Difficulty with supine R shoulder horizontal abduction/ adduction.   [facility] PT Assessment - 02/02/19 0001       Assessment   Medical Diagnosis  R shoulder arthroscopy bicep tendon repair and debridement    Referring Provider (PT)  Dr. Lennox Solders, MD    Onset Date/Surgical Date  11/20/18      Prior Function   Level of Independence  Independent      Cognition   Overall Cognitive Status  Within Functional Limits for tasks assessed         There.ex.:  B UBE73mn. F/b. Warm-up Wall ladder: shoulder flexion 10x (marked increase noted with sticker comparison).   Supine R shoulder AROM (all planes) Supine R shoulder manual isometrics all planes/ rhythmic stabs (mod. Resistance).  Standing cone reaching at 74" shelf (cuing to prevent compensatory movement patterns).  Nautilus:handleslat. Pull downs 40#/ tricep extension 20#/ sh. Adduction 20#/scap. Retraction 30# 20x each.  Manual tx.:  Supine/ seated R shoulder A/AROM press outs/ sh. Abduction 10x2 with PT assist. Supine/ seated STM to B UT/R deltoid/ biceps/ triceps musculature.           PT Short Term Goals - 02/02/19 1240      PT SHORT TERM GOAL #1   Title  Pt will demonstrate >100 degrees of PROM flexion/ abduction of R UE to demonstrate increased ROM with decreased guarding.    Baseline  R UE PROM flexion: 95, abduction: 85 with significant painful guarding 12/01/2018.  On 11/11, R sh. flexion/ abduction PROM grossly 100 deg. (pain limited)    Time  4    Period  Weeks    Status  Achieved    Target Date  12/31/18        PT Long Term Goals - 02/02/19 1241      PT LONG TERM GOAL #1   Title  Pt will increase FOTO score to 56 to demonstrate increased functional mobility.    Baseline  FOTO score 32 12/01/2018    Time  4    Period  Weeks    Status  Not Met    Target Date  03/02/19      PT LONG TERM GOAL #2   Title  Pt will demonstrate R UE A/AROM to [facility] compared to L UE to demonstrate increased UE mobility.    Baseline  R UE PROM grossly limited 2/2 pain (flexion: 95, abd: 85, ER: 20) 12/01/2018    Time  4     Period  Weeks    Status  Not Met    Target Date  03/02/19      PT LONG TERM GOAL #3   Title  Pt will report <4/10 R shoulder pain at worst with movement to indicate decreased pain with activity.    Baseline  7/10 worst R shoulder pain with R UE movement 12/01/2018.  On 11/11, persistent 4/10 R sh. pain with increase pain during overhead reaching    Time  4    Period  Weeks    Status  Partially Met    Target Date  03/02/19      PT LONG TERM GOAL #4   Title  Pt will be able to reach across body without pain for bathing and dressing.    Baseline  Horizontal abduction limited and significantly painful 12/01/2018    Time  4    Period  Weeks    Status  Achieved    Target Date  02/02/19            Plan - 02/02/19 1237    Clinical Impression Statement  Pt. reports no R shoulder pain at rest and increase R shoulder discomfort/ fatigue with overhead reaching/ abduction.  Supine R shoulder AROM >100 deg. but remains pain limited/ guarded.  Moderate R anterior deltoid/ prox. biceps discomfort with resisted ther.ex./ Nautilus.  PT encouraging pt. to increase arm swing with walking and use R shoulder more with daily tasks. Pt. has progressed to more seated/ standing A/AROM ther.ex. (see handouts). See updated goals. Pt. will continue with current HEP and benefit from skilled PT services to increase R sh. ROM/ strength/ pain mgmt.    Personal Factors and Comorbidities  Age;Comorbidity 3+    Comorbidities  CAD, hyperlipidemia, DM2, Crohn's disease    Examination-Activity Limitations  Bed Mobility;Carry;Bathing;Dressing;Hygiene/Grooming;Lift;Reach Overhead    Stability/Clinical Decision Making  Evolving/Moderate complexity    Clinical Decision Making  Moderate    Rehab Potential  Good    PT Frequency  2x / week    PT Duration  4 weeks    PT Treatment/Interventions  Moist Heat;Cryotherapy;Electrical Stimulation;Functional mobility training;Therapeutic activities;Therapeutic exercise;Manual  techniques;Patient/family education;Passive range of motion;ADLs/Self Care Home Management;Ultrasound;Neuromuscular re-education    PT Next Visit Plan  Progress R sh. AROM/ strengthening.   Determine Recert vs. Discharge.    PT Home Exercise Plan  see HEP    Consulted and Agree with Plan of Care  Patient       Patient will benefit from skilled therapeutic intervention in order to improve the following deficits and impairments:  Improper body mechanics, Pain, Postural dysfunction, Decreased mobility, Decreased activity tolerance, Decreased endurance, Decreased range of motion, Decreased strength, Hypomobility, Impaired UE functional use, Impaired flexibility  Visit Diagnosis: S/P right shoulder surgery  Joint stiffness  Muscle weakness (generalized)  Abnormal posture  Acute pain of right shoulder  Arthritis of right shoulder region     Problem List Patient Active Problem List   Diagnosis Date Noted  . Stable angina (HCC) 04/03/2018  . Right groin pain 06/22/2016  . Acute bronchitis 05/01/2016  . Diastasis recti 10/29/2015  . Rectal bleeding 06/10/2014  . AP (abdominal pain) 12/29/2013  . Crohn's disease (HCC) 12/18/2013    C , PT, DPT # [id number] 02/02/2019, 12:43 PM  [place] [facility] [address] [place], [place], [address] Phone: [phone]   Fax:  [phone]  Name: Joanna Ortiz MRN: [id number] Date of Birth: 09/09/1945   

## 2019-02-04 ENCOUNTER — Ambulatory Visit: Payer: Federal, State, Local not specified - PPO | Admitting: Physical Therapy

## 2019-02-04 ENCOUNTER — Other Ambulatory Visit: Payer: Self-pay

## 2019-02-04 ENCOUNTER — Encounter: Payer: Self-pay | Admitting: Physical Therapy

## 2019-02-04 DIAGNOSIS — R293 Abnormal posture: Secondary | ICD-10-CM

## 2019-02-04 DIAGNOSIS — Z9889 Other specified postprocedural states: Secondary | ICD-10-CM

## 2019-02-04 DIAGNOSIS — M2560 Stiffness of unspecified joint, not elsewhere classified: Secondary | ICD-10-CM

## 2019-02-04 DIAGNOSIS — M19011 Primary osteoarthritis, right shoulder: Secondary | ICD-10-CM

## 2019-02-04 DIAGNOSIS — M6281 Muscle weakness (generalized): Secondary | ICD-10-CM

## 2019-02-04 DIAGNOSIS — M25511 Pain in right shoulder: Secondary | ICD-10-CM

## 2019-02-06 NOTE — Therapy (Addendum)
[facility] [address]. [place], [place], [address] Phone: [phone]   Fax:  [phone]  Physical Therapy Treatment  Patient Details  Name: Joanna Ortiz MRN: [id number] Date of Birth: May 11, 1945 Referring Provider (PT): Dr. Lennox Solders, MD   Encounter Date: 02/04/2019    Treatment: 79 of 23.  Recert date: 8/32/5498 1025 to 1110     Past Medical History:  Diagnosis Date  . Anemia   . Arthritis   . Collagen vascular disease ([place])   . Coronary artery disease   . Crohn disease ([place])   . Diabetes mellitus without complication (HCC)    diet controlled  . Hyperlipidemia   . Hypothyroidism   . Pneumonia    3/18  . Thyroid disease    goiter    Past Surgical History:  Procedure Laterality Date  . ABDOMINAL HYSTERECTOMY  1987  . APPENDECTOMY    . BREAST BIOPSY Bilateral    neg  . CATARACT EXTRACTION W/PHACO Right 11/06/2016   Procedure: CATARACT EXTRACTION PHACO AND INTRAOCULAR LENS PLACEMENT (IOC);  Surgeon: Birder Robson, MD;  Location: [facility];  Service: Ophthalmology;  Laterality: Right;  [place] 00:42.6 AP% 16.7 CDE 7.14 Fluid Pack Lot # [id number]  . CATARACT EXTRACTION W/PHACO Left 12/25/2016   Procedure: CATARACT EXTRACTION PHACO AND INTRAOCULAR LENS PLACEMENT (IOC);  Surgeon: Birder Robson, MD;  Location: [facility];  Service: Ophthalmology;  Laterality: Left;  [place] 00:32 AP% 13.7 CDE 4.40 Fluid pack lot # [id number]  . COLON SURGERY  06/06/2011   Resection of terminal ileum and previous ileocolonic anastomosis for recurrent Crohn's disease. 3 focal strictures identified.  . COLONOSCOPY  2013  . JOINT REPLACEMENT Left    SHOULDER  . LEFT HEART CATH AND CORONARY ANGIOGRAPHY Left 04/04/2018   Procedure: LEFT HEART CATH AND CORONARY ANGIOGRAPHY;  Surgeon: Corey Skains, MD;  Location: [place] [facility];  Service: Cardiovascular;  Laterality: Left;  . SHOULDER SURGERY Right 11/20/2018   Procedure: R  SHOULDER ARTHROSCOPY: biceps tenomyotomy, distal claviculectomy, acromioplasty, posterior capsule release, debridement, decrompression of subacromial space, coracoacromial release, bursectomy  . SMALL INTESTINE SURGERY  2012  . THYROID SURGERY  1992  . TONSILLECTOMY      There were no vitals filed for this visit.    Pt. states she is doing okay.       There.ex.:  B UBE9mn. F/b. Warm-up/ discussed HEP Seated/supine R shoulder AROM (all planes) Supine R shoulder manual isometrics all planes/ rhythmic stabs (mod. Resistance).  Standing cone reaching at 74" shelf (cuing to prevent compensatory movement patterns). Nautilus:handleslat. Pull downs40#/ tricep extension 20#/ sh. Adduction20#/scap. Retraction 30# 20x each. Waist level box lift (11#) and B carrying around PT clinic (no pain in sh.).   Manual tx.:  Supine/ seated R shoulder A/AROM press outs/ sh. Abduction 10x2 with PT assist. Supine/ seated STM to B UT/R deltoid/ biceps/ triceps musculature.    PT Short Term Goals - 02/02/19 1240      PT SHORT TERM GOAL #1   Title  Pt will demonstrate >100 degrees of PROM flexion/ abduction of R UE to demonstrate increased ROM with decreased guarding.    Baseline  R UE PROM flexion: 95, abduction: 85 with significant painful guarding 12/01/2018.  On 11/11, R sh. flexion/ abduction PROM grossly 100 deg. (pain limited)    Time  4    Period  Weeks    Status  Achieved    Target Date  12/31/18  PT Long Term Goals - 02/02/19 1241      PT LONG TERM GOAL #1   Title  Pt will increase FOTO score to 56 to demonstrate increased functional mobility.    Baseline  FOTO score 32 12/01/2018    Time  4    Period  Weeks    Status  Not Met    Target Date  03/02/19      PT LONG TERM GOAL #2   Title  Pt will demonstrate R UE A/AROM to [facility] as compared to L UE to demonstrate increased UE mobility.    Baseline  R UE PROM grossly limited 2/2 pain (flexion: 95, abd: 85, ER:  20) 12/01/2018    Time  4    Period  Weeks    Status  Not Met    Target Date  03/02/19      PT LONG TERM GOAL #3   Title  Pt will report <4/10 R shoulder pain at worst with movement to indicate decreased pain with activity.    Baseline  7/10 worst R shoulder pain with R UE movement 12/01/2018.  On 11/11, persistent 4/10 R sh. pain with increase pain during overhead reaching    Time  4    Period  Weeks    Status  Partially Met    Target Date  03/02/19      PT LONG TERM GOAL #4   Title  Pt will be able to reach across body without pain for bathing and dressing.    Baseline  Horizontal abduction limited and significantly painful 12/01/2018    Time  4    Period  Weeks    Status  Achieved    Target Date  02/02/19          Plan - 02/08/19 0730    Clinical Impression Statement  Good mechanics with B box carrying (11#) around PT clinic.  Pt. continues to work hard with R shoulder ROM/ strengthening ex. program.  Pt. benefits from PT assist/ cuing and guidance with R sh. strengthening progression.  R UT activation noted with shoulder flexion/ abduction.  Pt. has decrease overall pain during tx. session.    Personal Factors and Comorbidities  Age;Comorbidity 3+    Comorbidities  CAD, hyperlipidemia, DM2, Crohn's disease    Examination-Activity Limitations  Bed Mobility;Carry;Bathing;Dressing;Hygiene/Grooming;Lift;Reach Overhead    Stability/Clinical Decision Making  Evolving/Moderate complexity    Clinical Decision Making  Moderate    Rehab Potential  Good    PT Frequency  2x / week    PT Duration  4 weeks    PT Treatment/Interventions  Moist Heat;Cryotherapy;Electrical Stimulation;Functional mobility training;Therapeutic activities;Therapeutic exercise;Manual techniques;Patient/family education;Passive range of motion;ADLs/Self Care Home Management;Ultrasound;Neuromuscular re-education    PT Next Visit Plan  Progress R sh. AROM/ strengthening.  Decrease tx. frequency to 1x/week    PT  Home Exercise Plan  see HEP    Consulted and Agree with Plan of Care  Patient       Patient will benefit from skilled therapeutic intervention in order to improve the following deficits and impairments:  Improper body mechanics, Pain, Postural dysfunction, Decreased mobility, Decreased activity tolerance, Decreased endurance, Decreased range of motion, Decreased strength, Hypomobility, Impaired UE functional use, Impaired flexibility  Visit Diagnosis: S/P right shoulder surgery  Joint stiffness  Muscle weakness (generalized)  Abnormal posture  Acute pain of right shoulder  Arthritis of right shoulder region     Problem List Patient Active Problem List   Diagnosis Date Noted  .  Stable angina ([place]) 04/03/2018  . Right groin pain 06/22/2016  . Acute bronchitis 05/01/2016  . Diastasis recti 10/29/2015  . Rectal bleeding 06/10/2014  . AP (abdominal pain) 12/29/2013  . Crohn's disease ([place]) 12/18/2013   Pura Spice, PT, DPT # [phone] 02/08/2019, 7:36 AM  [place] [facility] [address] [place], [place], [address] Phone: [phone]   Fax:  [phone]  Name: Joanna Ortiz MRN: [id number] Date of Birth: 1945/03/17

## 2019-02-11 ENCOUNTER — Other Ambulatory Visit: Payer: Self-pay

## 2019-02-11 ENCOUNTER — Ambulatory Visit: Payer: Federal, State, Local not specified - PPO | Admitting: Physical Therapy

## 2019-02-11 ENCOUNTER — Other Ambulatory Visit
Admission: RE | Admit: 2019-02-11 | Discharge: 2019-02-11 | Disposition: A | Discharge status: Home / Self Care | Payer: Medicare Other | Attending: Otolaryngology | Admitting: Otolaryngology

## 2019-02-11 ENCOUNTER — Encounter: Payer: Self-pay | Admitting: Physical Therapy

## 2019-02-11 DIAGNOSIS — M6281 Muscle weakness (generalized): Secondary | ICD-10-CM

## 2019-02-11 DIAGNOSIS — M19011 Primary osteoarthritis, right shoulder: Secondary | ICD-10-CM

## 2019-02-11 DIAGNOSIS — Z9889 Other specified postprocedural states: Secondary | ICD-10-CM | POA: Diagnosis not present

## 2019-02-11 DIAGNOSIS — R293 Abnormal posture: Secondary | ICD-10-CM

## 2019-02-11 DIAGNOSIS — M2560 Stiffness of unspecified joint, not elsewhere classified: Secondary | ICD-10-CM

## 2019-02-11 DIAGNOSIS — E890 Postprocedural hypothyroidism: Secondary | ICD-10-CM | POA: Insufficient documentation

## 2019-02-11 DIAGNOSIS — M25511 Pain in right shoulder: Secondary | ICD-10-CM

## 2019-02-11 LAB — TSH: TSH: 6.477 u[IU]/mL — ABNORMAL HIGH (ref 0.350–4.500)

## 2019-02-11 LAB — T4, FREE: Free T4: 0.94 ng/dL (ref 0.61–1.12)

## 2019-02-11 NOTE — Therapy (Signed)
[place] [facility] [address] [place], [place], [address] Phone: [phone]   Fax:  [phone]  Physical Therapy Treatment  Patient Details  Name: Joanna Ortiz MRN: [id number] Date of Birth: 02-29-1945 Referring Provider (PT): Dr. Lennox Solders, MD   Encounter Date: 02/11/2019  PT End of Session - 02/11/19 1112    Visit Number  17    Number of Visits  23    Date for PT Re-Evaluation  03/02/19    Authorization - Visit Number  3    Authorization - Number of Visits  10    PT Start Time  1108    PT Stop Time  1200    PT Time Calculation (min)  52 min    Activity Tolerance  Patient tolerated treatment well;Patient limited by pain    Behavior During Therapy  [facility] for tasks assessed/performed       Past Medical History:  Diagnosis Date  . Anemia   . Arthritis   . Collagen vascular disease ([place])   . Coronary artery disease   . Crohn disease ([place])   . Diabetes mellitus without complication (HCC)    diet controlled  . Hyperlipidemia   . Hypothyroidism   . Pneumonia    3/18  . Thyroid disease    goiter    Past Surgical History:  Procedure Laterality Date  . ABDOMINAL HYSTERECTOMY  1987  . APPENDECTOMY    . BREAST BIOPSY Bilateral    neg  . CATARACT EXTRACTION W/PHACO Right 11/06/2016   Procedure: CATARACT EXTRACTION PHACO AND INTRAOCULAR LENS PLACEMENT (IOC);  Surgeon: Birder Robson, MD;  Location: [facility];  Service: Ophthalmology;  Laterality: Right;  [place] 00:42.6 AP% 16.7 CDE 7.14 Fluid Pack Lot # [id number]  . CATARACT EXTRACTION W/PHACO Left 12/25/2016   Procedure: CATARACT EXTRACTION PHACO AND INTRAOCULAR LENS PLACEMENT (IOC);  Surgeon: Birder Robson, MD;  Location: [facility];  Service: Ophthalmology;  Laterality: Left;  [place] 00:32 AP% 13.7 CDE 4.40 Fluid pack lot # [id number] H  . COLON SURGERY  06/06/2011   Resection of terminal ileum and previous ileocolonic anastomosis for recurrent Crohn's disease. 3 focal  strictures identified.  . COLONOSCOPY  2013  . JOINT REPLACEMENT Left    SHOULDER  . LEFT HEART CATH AND CORONARY ANGIOGRAPHY Left 04/04/2018   Procedure: LEFT HEART CATH AND CORONARY ANGIOGRAPHY;  Surgeon: Corey Skains, MD;  Location: [place] [facility];  Service: Cardiovascular;  Laterality: Left;  . SHOULDER SURGERY Right 11/20/2018   Procedure: R SHOULDER ARTHROSCOPY: biceps tenomyotomy, distal claviculectomy, acromioplasty, posterior capsule release, debridement, decrompression of subacromial space, coracoacromial release, bursectomy  . SMALL INTESTINE SURGERY  2012  . THYROID SURGERY  1992  . TONSILLECTOMY      There were no vitals filed for this visit.  Subjective Assessment - 02/11/19 1110    Subjective  Pt. reports no R shoulder pain.   Pt. states she fatigues easily with repetitive tasks at home/ ther.ex.    Pertinent History  Pt hx of progressive R shoulder pain and stiffness.  Received steroid injections in June that provided significant pain relief.  However, after a visit with her chiropractor for some left-sided neck pain in July, she reported the return of R shoulder pain with increased "catching" and dull aching (no trauma reported).  Pt was dx with a labral tear of the long head of biceps tendon of the R UE.  Pt is presenting to therapy s/p R shoulder arthroscopy on 11/20/2018 (biceps  tenomyotomy, debridement, subacromial decompression, capsule release, bursectomy).  Pt c/c L shoulder constant, aching pain (best: 2/10 with ice, rest and meds; worst: 7/10 with using arm, reaching across body; worse in the morning).  Pt hx of L TSA several years ago, R UE arthritis, CAD, hyperlipidemia, DM2, Crohn's disease.    Limitations  House hold activities;Writing;Lifting    Patient Stated Goals  Increase L UE ROM and decrease pain with ADLs    Currently in Pain?  No/denies         There.ex.:  B UBE47mn. F/b. Warm-up/ discussed R sh. With household tasks Standing B  shoulder flexion with ball 10x (short overhead holds). Supine R shoulder/elbow manual isometrics all planes/ rhythmic stabs (mod. Resistance). Nautilus:handleslat. Pull downs40#/ tricep extension 20#/ sh. Abduction 20#/ scap. Retraction 30# 20x each. Reviewed HEP/ household tasks.    Manual tx.:  Supine/ seated R shoulder A/AROM press outs/ sh. Abduction 12x with PT assist. Gentle oscillations during ROM/ stretches.   Supine/ seated STM to B UT/R deltoid/ biceps/ triceps musculature.      PT Short Term Goals - 02/02/19 1240      PT SHORT TERM GOAL #1   Title  Pt will demonstrate >100 degrees of PROM flexion/ abduction of R UE to demonstrate increased ROM with decreased guarding.    Baseline  R UE PROM flexion: 95, abduction: 85 with significant painful guarding 12/01/2018.  On 11/11, R sh. flexion/ abduction PROM grossly 100 deg. (pain limited)    Time  4    Period  Weeks    Status  Achieved    Target Date  12/31/18        PT Long Term Goals - 02/02/19 1241      PT LONG TERM GOAL #1   Title  Pt will increase FOTO score to 56 to demonstrate increased functional mobility.    Baseline  FOTO score 32 12/01/2018    Time  4    Period  Weeks    Status  Not Met    Target Date  03/02/19      PT LONG TERM GOAL #2   Title  Pt will demonstrate [facility] UE [facility] to [facility] to demonstrate increased UE mobility.    Baseline  R UE PROM grossly limited 2/2 pain (flexion: 95, abd: 85, ER: 20) 12/01/2018    Time  4    Period  Weeks    Status  Not Met    Target Date  03/02/19      PT LONG TERM GOAL #3   Title  Pt will report <4/10 R shoulder pain at worst with movement to indicate decreased pain with activity.    Baseline  7/10 worst R shoulder pain with R UE movement 12/01/2018.  On 11/11, persistent 4/10 R sh. pain with increase pain during overhead reaching    Time  4    Period  Weeks    Status  Partially Met    Target Date  03/02/19      PT LONG TERM GOAL #4    Title  Pt will be able to reach across body without pain for bathing and dressing.    Baseline  Horizontal abduction limited and significantly painful 12/01/2018    Time  4    Period  Weeks    Status  Achieved    Target Date  02/02/19            Plan - 02/11/19 1112  Clinical Impression Statement  Decrease R UT compensatory movement patterns with standing R shoulder flexion/ Nautilus ex.  Pt. remains pain limited with R shoulder AA/PROM with supine flexion/ horizontal abduction.  Pt. making slow but consistent progress with R shoulder resistive ex.    Personal Factors and Comorbidities  Age;Comorbidity 3+    Comorbidities  CAD, hyperlipidemia, DM2, Crohn's disease    Examination-Activity Limitations  Bed Mobility;Carry;Bathing;Dressing;Hygiene/Grooming;Lift;Reach Overhead    Stability/Clinical Decision Making  Evolving/Moderate complexity    Clinical Decision Making  Moderate    Rehab Potential  Good    PT Frequency  2x / week    PT Duration  4 weeks    PT Treatment/Interventions  Moist Heat;Cryotherapy;Electrical Stimulation;Functional mobility training;Therapeutic activities;Therapeutic exercise;Manual techniques;Patient/family education;Passive range of motion;ADLs/Self Care Home Management;Ultrasound;Neuromuscular re-education    PT Next Visit Plan  Progress R sh. AROM/ strengthening.  Decrease tx. frequency to 1x/week    PT Home Exercise Plan  see HEP    Consulted and Agree with Plan of Care  Patient       Patient will benefit from skilled therapeutic intervention in order to improve the following deficits and impairments:  Improper body mechanics, Pain, Postural dysfunction, Decreased mobility, Decreased activity tolerance, Decreased endurance, Decreased range of motion, Decreased strength, Hypomobility, Impaired UE functional use, Impaired flexibility  Visit Diagnosis: S/P right shoulder surgery  Joint stiffness  Muscle weakness (generalized)  Abnormal  posture  Acute pain of right shoulder  Arthritis of right shoulder region     Problem List Patient Active Problem List   Diagnosis Date Noted  . Stable angina ([place]) 04/03/2018  . Right groin pain 06/22/2016  . Acute bronchitis 05/01/2016  . Diastasis recti 10/29/2015  . Rectal bleeding 06/10/2014  . AP (abdominal pain) 12/29/2013  . Crohn's disease ([place]) 12/18/2013   Pura Spice, PT, DPT # [phone] 02/11/2019, 12:16 PM  [place] [facility] [address] [place], [place], [address] Phone: [phone]   Fax:  [phone]  Name: YANET BALLIET MRN: [id number] Date of Birth: 03-13-1945

## 2019-02-12 LAB — T3, FREE: T3, Free: 2.9 pg/mL (ref 2.0–4.4)

## 2019-02-18 ENCOUNTER — Ambulatory Visit: Payer: Federal, State, Local not specified - PPO | Admitting: Physical Therapy

## 2019-02-25 ENCOUNTER — Ambulatory Visit: Payer: Federal, State, Local not specified - PPO | Attending: Orthopedic Surgery | Admitting: Physical Therapy

## 2019-02-25 ENCOUNTER — Encounter: Payer: Self-pay | Admitting: Physical Therapy

## 2019-02-25 ENCOUNTER — Other Ambulatory Visit: Payer: Self-pay

## 2019-02-25 DIAGNOSIS — M6281 Muscle weakness (generalized): Secondary | ICD-10-CM | POA: Diagnosis present

## 2019-02-25 DIAGNOSIS — M19011 Primary osteoarthritis, right shoulder: Secondary | ICD-10-CM | POA: Insufficient documentation

## 2019-02-25 DIAGNOSIS — Z9889 Other specified postprocedural states: Secondary | ICD-10-CM | POA: Diagnosis present

## 2019-02-25 DIAGNOSIS — M25511 Pain in right shoulder: Secondary | ICD-10-CM

## 2019-02-25 DIAGNOSIS — M2560 Stiffness of unspecified joint, not elsewhere classified: Secondary | ICD-10-CM | POA: Diagnosis present

## 2019-02-25 DIAGNOSIS — R293 Abnormal posture: Secondary | ICD-10-CM | POA: Diagnosis present

## 2019-02-25 NOTE — Therapy (Signed)
[place] [facility] [address] [place], [place], [address] Phone: [phone]   Fax:  [phone]  Physical Therapy Treatment  Patient Details  Name: Joanna Ortiz MRN: [id number] Date of Birth: [birth year] Referring Provider (PT): Dr. Lennox Solders, MD   Encounter Date: 02/25/2019  PT End of Session - 02/25/19 1032    Visit Number  18    Number of Visits  23    Date for PT Re-Evaluation  03/02/19    Authorization - Visit Number  4    Authorization - Number of Visits  10    PT Start Time  0913    PT Stop Time  1002    PT Time Calculation (min)  49 min    Activity Tolerance  Patient tolerated treatment well;Patient limited by pain    Behavior During Therapy  [facility] for tasks assessed/performed       Past Medical History:  Diagnosis Date  . Anemia   . Arthritis   . Collagen vascular disease ([place])   . Coronary artery disease   . Crohn disease ([place])   . Diabetes mellitus without complication (HCC)    diet controlled  . Hyperlipidemia   . Hypothyroidism   . Pneumonia    3/18  . Thyroid disease    goiter    Past Surgical History:  Procedure Laterality Date  . ABDOMINAL HYSTERECTOMY  1987  . APPENDECTOMY    . BREAST BIOPSY Bilateral    neg  . CATARACT EXTRACTION W/PHACO Right 11/06/2016   Procedure: CATARACT EXTRACTION PHACO AND INTRAOCULAR LENS PLACEMENT (IOC);  Surgeon: Birder Robson, MD;  Location: [facility];  Service: Ophthalmology;  Laterality: Right;  [place] 00:42.6 AP% 16.7 CDE 7.14 Fluid Pack Lot # [id number]  . CATARACT EXTRACTION W/PHACO Left 12/25/2016   Procedure: CATARACT EXTRACTION PHACO AND INTRAOCULAR LENS PLACEMENT (IOC);  Surgeon: Birder Robson, MD;  Location: [facility];  Service: Ophthalmology;  Laterality: Left;  [place] 00:32 AP% 13.7 CDE 4.40 Fluid pack lot # [id number]  . COLON SURGERY  06/06/2011   Resection of terminal ileum and previous ileocolonic anastomosis for recurrent Crohn's disease. 3 focal  strictures identified.  . COLONOSCOPY  2013  . JOINT REPLACEMENT Left    SHOULDER  . LEFT HEART CATH AND CORONARY ANGIOGRAPHY Left 04/04/2018   Procedure: LEFT HEART CATH AND CORONARY ANGIOGRAPHY;  Surgeon: Corey Skains, MD;  Location: [place] [facility];  Service: Cardiovascular;  Laterality: Left;  . SHOULDER SURGERY Right 11/20/2018   Procedure: R SHOULDER ARTHROSCOPY: biceps tenomyotomy, distal claviculectomy, acromioplasty, posterior capsule release, debridement, decrompression of subacromial space, coracoacromial release, bursectomy  . SMALL INTESTINE SURGERY  2012  . THYROID SURGERY  1992  . TONSILLECTOMY      There were no vitals filed for this visit.  Subjective Assessment - 02/25/19 1024    Subjective  Pt. states she has been compliant with HEP.  Pt. states R shoulder is slowly getting better.  No new complaints reported.    Pertinent History  Pt hx of progressive R shoulder pain and stiffness.  Received steroid injections in June that provided significant pain relief.  However, after a visit with her chiropractor for some left-sided neck pain in July, she reported the return of R shoulder pain with increased "catching" and dull aching (no trauma reported).  Pt was dx with a labral tear of the long head of biceps tendon of the R UE.  Pt is presenting to therapy s/p R shoulder arthroscopy  on 11/20/2018 (biceps tenomyotomy, debridement, subacromial decompression, capsule release, bursectomy).  Pt c/c L shoulder constant, aching pain (best: 2/10 with ice, rest and meds; worst: 7/10 with using arm, reaching across body; worse in the morning).  Pt hx of L TSA several years ago, R UE arthritis, CAD, hyperlipidemia, DM2, Crohn's disease.    Limitations  House hold activities;Writing;Lifting    Patient Stated Goals  Increase L UE ROM and decrease pain with ADLs    Currently in Pain?  No/denies          There.ex.:  B UBE52mn. F/b. Warm-up/ discussed R sh. With household  tasks Standing B shoulder flexion with ball 10x (short overhead holds). Nautilus:handleslat. Pull downs40#/ tricep extension 30#/ sh. Abduction 30#/ scap. Retraction 30#/ single handle shoulder flexion 20x each. Supine wt. Wand press-ups/ shoulder flexion 20x. Supine R shoulder AROM flexion/ horizontal abduction (tolerable end-range)  Reassessed goals.      PT Short Term Goals - 02/25/19 1039      PT SHORT TERM GOAL #1   Title  Pt will demonstrate >100 degrees of PROM flexion/ abduction of R UE to demonstrate increased ROM with decreased guarding.    Baseline  R UE PROM flexion: 95, abduction: 85 with significant painful guarding 12/01/2018.  On 11/11, R sh. flexion/ abduction PROM grossly 100 deg. (pain limited)    Time  4    Period  Weeks    Status  Achieved    Target Date  12/31/18        PT Long Term Goals - 02/25/19 1039      PT LONG TERM GOAL #1   Title  Pt will increase FOTO score to 56 to demonstrate increased functional mobility.    Baseline  FOTO score 32 12/01/2018    Time  4    Period  Weeks    Status  Not Met    Target Date  02/25/19      PT LONG TERM GOAL #2   Title  Pt will demonstrate R UE [facility] to L UE to demonstrate increased UE mobility.    Baseline  R UE PROM grossly limited 2/2 pain (flexion: 95, abd: 85, ER: 20) 12/01/2018.  On 1/6: R shoulder WFL with overhead reaching    Time  4    Period  Weeks    Status  Achieved    Target Date  02/25/19      PT LONG TERM GOAL #3   Title  Pt will report <4/10 R shoulder pain at worst with movement to indicate decreased pain with activity.    Baseline  7/10 worst R shoulder pain with R UE movement 12/01/2018.  On 11/11, persistent 4/10 R sh. pain with increase pain during overhead reaching    Time  4    Period  Weeks    Status  Achieved    Target Date  02/25/19      PT LONG TERM GOAL #4   Title  Pt will be able to reach across body without pain for bathing and dressing.    Baseline   Horizontal abduction limited and significantly painful 12/01/2018.  1/6: pt. able to wash L shoulder/ side    Time  4    Period  Weeks    Status  Achieved    Target Date  02/02/19         Plan - 02/25/19 1033    Clinical Impression Statement  Pt. has worked hard with skilled  PT services over past couple months.  Pt. presents with functional R shoulder AROM with daily household tasks/ ADL.  Pt. understands current progressive HEP and instructed to contact PT if any regression or change in symptoms.  Discharge from PT at this time.    Personal Factors and Comorbidities  Age;Comorbidity 3+    Comorbidities  CAD, hyperlipidemia, DM2, Crohn's disease    Examination-Activity Limitations  Bed Mobility;Carry;Bathing;Dressing;Hygiene/Grooming;Lift;Reach Overhead    Stability/Clinical Decision Making  Evolving/Moderate complexity    Clinical Decision Making  Moderate    Rehab Potential  Good    PT Frequency  2x / week    PT Duration  4 weeks    PT Treatment/Interventions  Moist Heat;Cryotherapy;Electrical Stimulation;Functional mobility training;Therapeutic activities;Therapeutic exercise;Manual techniques;Patient/family education;Passive range of motion;ADLs/Self Care Home Management;Ultrasound;Neuromuscular re-education    PT Next Visit Plan  Discharge    PT Home Exercise Plan  see HEP    Consulted and Agree with Plan of Care  Patient       Patient will benefit from skilled therapeutic intervention in order to improve the following deficits and impairments:  Improper body mechanics, Pain, Postural dysfunction, Decreased mobility, Decreased activity tolerance, Decreased endurance, Decreased range of motion, Decreased strength, Hypomobility, Impaired UE functional use, Impaired flexibility  Visit Diagnosis: S/P right shoulder surgery  Joint stiffness  Muscle weakness (generalized)  Abnormal posture  Acute pain of right shoulder  Arthritis of right shoulder region     Problem  List Patient Active Problem List   Diagnosis Date Noted  . Stable angina ([place]) 04/03/2018  . Right groin pain 06/22/2016  . Acute bronchitis 05/01/2016  . Diastasis recti 10/29/2015  . Rectal bleeding 06/10/2014  . AP (abdominal pain) 12/29/2013  . Crohn's disease ([place]) 12/18/2013   Pura Spice, PT, DPT # [phone] 02/25/2019, 10:42 AM  [facility] [address] [place], [place], [address] Phone: [phone]   Fax:  [phone]  Name: Joanna Ortiz MRN: [id number] Date of Birth: 1945/12/11

## 2019-04-24 DIAGNOSIS — K56600 Partial intestinal obstruction, unspecified as to cause: Secondary | ICD-10-CM | POA: Insufficient documentation

## 2019-04-27 ENCOUNTER — Encounter: Payer: Self-pay | Admitting: *Deleted

## 2019-04-27 ENCOUNTER — Other Ambulatory Visit: Payer: Self-pay

## 2019-04-27 DIAGNOSIS — I2510 Atherosclerotic heart disease of native coronary artery without angina pectoris: Secondary | ICD-10-CM | POA: Diagnosis not present

## 2019-04-27 DIAGNOSIS — E1165 Type 2 diabetes mellitus with hyperglycemia: Secondary | ICD-10-CM | POA: Diagnosis not present

## 2019-04-27 DIAGNOSIS — R739 Hyperglycemia, unspecified: Secondary | ICD-10-CM | POA: Diagnosis present

## 2019-04-27 DIAGNOSIS — Z96612 Presence of left artificial shoulder joint: Secondary | ICD-10-CM | POA: Diagnosis not present

## 2019-04-27 DIAGNOSIS — Z794 Long term (current) use of insulin: Secondary | ICD-10-CM | POA: Insufficient documentation

## 2019-04-27 DIAGNOSIS — Z79899 Other long term (current) drug therapy: Secondary | ICD-10-CM | POA: Insufficient documentation

## 2019-04-27 DIAGNOSIS — E039 Hypothyroidism, unspecified: Secondary | ICD-10-CM | POA: Diagnosis not present

## 2019-04-27 DIAGNOSIS — Z87891 Personal history of nicotine dependence: Secondary | ICD-10-CM | POA: Diagnosis not present

## 2019-04-27 LAB — GLUCOSE, CAPILLARY: Glucose-Capillary: 331 mg/dL — ABNORMAL HIGH (ref 70–99)

## 2019-04-27 NOTE — ED Notes (Signed)
fsbs 331 in triage.

## 2019-04-27 NOTE — ED Triage Notes (Signed)
Pt to triage via wheelchair.  Pt was discharged from [place] today. Pt is prednisone and reports elevated blood sugars. Pt states she is jittery. Pt alert

## 2019-04-28 ENCOUNTER — Emergency Department
Admission: EM | Admit: 2019-04-28 | Discharge: 2019-04-28 | Disposition: A | Discharge status: Home / Self Care | Payer: Federal, State, Local not specified - PPO | Attending: Emergency Medicine | Admitting: Emergency Medicine

## 2019-04-28 DIAGNOSIS — R739 Hyperglycemia, unspecified: Secondary | ICD-10-CM

## 2019-04-28 LAB — BASIC METABOLIC PANEL
Anion gap: 13 (ref 5–15)
BUN: 25 mg/dL — ABNORMAL HIGH (ref 8–23)
CO2: 24 mmol/L (ref 22–32)
Calcium: 8.6 mg/dL — ABNORMAL LOW (ref 8.9–10.3)
Chloride: 99 mmol/L (ref 98–111)
Creatinine, Ser: 0.92 mg/dL (ref 0.44–1.00)
GFR calc Af Amer: >60 mL/min (ref >60)
GFR calc non Af Amer: >60 mL/min (ref >60)
Glucose, Bld: 348 mg/dL — ABNORMAL HIGH (ref 70–99)
Potassium: 3.7 mmol/L (ref 3.5–5.1)
Sodium: 136 mmol/L (ref 135–145)

## 2019-04-28 LAB — URINALYSIS, COMPLETE (UACMP) WITH MICROSCOPIC
Bacteria, UA: NONE SEEN
Bilirubin Urine: NEGATIVE
Glucose, UA: NEGATIVE mg/dL
Hgb urine dipstick: NEGATIVE
Ketones, ur: NEGATIVE mg/dL
Nitrite: NEGATIVE
Protein, ur: NEGATIVE mg/dL
Specific Gravity, Urine: 1.01 (ref 1.005–1.030)
pH: 6 (ref 5.0–8.0)

## 2019-04-28 LAB — CBC
HCT: 40.7 % (ref 36.0–46.0)
Hemoglobin: 13.1 g/dL (ref 12.0–15.0)
MCH: 29 pg (ref 26.0–34.0)
MCHC: 32.2 g/dL (ref 30.0–36.0)
MCV: 90 fL (ref 80.0–100.0)
Platelets: 222 10*3/uL (ref 150–400)
RBC: 4.52 MIL/uL (ref 3.87–5.11)
RDW: 14.4 % (ref 11.5–15.5)
WBC: 10.1 10*3/uL (ref 4.0–10.5)
nRBC: 0 % (ref 0.0–0.2)

## 2019-04-28 LAB — GLUCOSE, CAPILLARY: Glucose-Capillary: 248 mg/dL — ABNORMAL HIGH (ref 70–99)

## 2019-04-28 MED ORDER — DEXTROSE 50 % IV SOLN
12.50 | INTRAVENOUS | Status: DC
Start: ? — End: 2019-04-28

## 2019-04-28 MED ORDER — METFORMIN HCL 500 MG PO TABS
500.0000 mg | ORAL_TABLET | Freq: Once | ORAL | Status: AC
  Administered 2019-04-28: 500 mg via ORAL
  Filled 2019-04-28: qty 1

## 2019-04-28 MED ORDER — ENOXAPARIN SODIUM 40 MG/0.4ML ~~LOC~~ SOLN
40.00 | SUBCUTANEOUS | Status: DC
Start: 2019-04-28 — End: 2019-04-28

## 2019-04-28 MED ORDER — THYROID 30 MG PO TABS
30.00 | ORAL_TABLET | ORAL | Status: DC
Start: 2019-04-28 — End: 2019-04-28

## 2019-04-28 MED ORDER — INSULIN LISPRO 100 UNIT/ML ~~LOC~~ SOLN
0.00 | SUBCUTANEOUS | Status: DC
Start: 2019-04-27 — End: 2019-04-28

## 2019-04-28 MED ORDER — LEVOTHYROXINE SODIUM 75 MCG PO TABS
75.00 | ORAL_TABLET | ORAL | Status: DC
Start: 2019-04-28 — End: 2019-04-28

## 2019-04-28 MED ORDER — PROCHLORPERAZINE EDISYLATE 10 MG/2ML IJ SOLN
10.00 | INTRAMUSCULAR | Status: DC
Start: ? — End: 2019-04-28

## 2019-04-28 MED ORDER — ACETAMINOPHEN 325 MG PO TABS
650.00 | ORAL_TABLET | ORAL | Status: DC
Start: ? — End: 2019-04-28

## 2019-04-28 MED ORDER — PREDNISONE 20 MG PO TABS
40.00 | ORAL_TABLET | ORAL | Status: DC
Start: 2019-04-28 — End: 2019-04-28

## 2019-04-28 MED ORDER — SCOPOLAMINE 1 MG/3DAYS TD PT72
1.00 | MEDICATED_PATCH | TRANSDERMAL | Status: DC
Start: 2019-04-30 — End: 2019-04-28

## 2019-04-28 MED ORDER — LIDOCAINE HCL 1 % IJ SOLN
0.50 | INTRAMUSCULAR | Status: DC
Start: ? — End: 2019-04-28

## 2019-04-28 MED ORDER — LORATADINE 10 MG PO TABS
10.00 | ORAL_TABLET | ORAL | Status: DC
Start: 2019-04-28 — End: 2019-04-28

## 2019-04-28 MED ORDER — CALCIUM CARBONATE-VITAMIN D 600-400 MG-UNIT PO TABS
1.00 | ORAL_TABLET | ORAL | Status: DC
Start: 2019-04-28 — End: 2019-04-28

## 2019-04-28 MED ORDER — ONDANSETRON HCL 4 MG/2ML IJ SOLN
4.00 | INTRAMUSCULAR | Status: DC
Start: ? — End: 2019-04-28

## 2019-04-28 MED ORDER — SIMETHICONE 80 MG PO CHEW
80.00 | CHEWABLE_TABLET | ORAL | Status: DC
Start: ? — End: 2019-04-28

## 2019-04-28 MED ORDER — ALBUTEROL SULFATE HFA 108 (90 BASE) MCG/ACT IN AERS
2.00 | INHALATION_SPRAY | RESPIRATORY_TRACT | Status: DC
Start: ? — End: 2019-04-28

## 2019-04-28 MED ORDER — GLUCAGON (RDNA) 1 MG IJ KIT
1.00 | PACK | INTRAMUSCULAR | Status: DC
Start: ? — End: 2019-04-28

## 2019-04-28 MED ORDER — MELATONIN 3 MG PO TABS
3.00 | ORAL_TABLET | ORAL | Status: DC
Start: ? — End: 2019-04-28

## 2019-04-28 MED ORDER — FLUTICASONE PROPIONATE 50 MCG/ACT NA SUSP
2.00 | NASAL | Status: DC
Start: 2019-04-28 — End: 2019-04-28

## 2019-04-28 NOTE — ED Provider Notes (Signed)
[facility] Emergency Department Provider Note  ____________________________________________   First MD Initiated Contact with Patient 04/28/19 0209     (approximate)  I have reviewed the triage vital signs and the nursing notes.   HISTORY  Chief Complaint Hyperglycemia   HPI Joanna Ortiz is a 74 y.o. female with below list of previous medical conditions including Crohn's disease for which patient was recently admitted to [facility] discharged home on a prednisone taper presents to the emergency department secondary to hyperglycemia.  Patient states that she was hyperglycemic before discharge from [place] today at which point she was given 2 units of insulin.  Patient states that her glucose at home read high and that she felt "jittery" which prompted her visit to the emergency department tonight.  On arrival to the ED patient's glucose was 331.        Past Medical History:  Diagnosis Date  . Anemia   . Arthritis   . Collagen vascular disease ([place])   . Coronary artery disease   . Crohn disease ([place])   . Diabetes mellitus without complication (HCC)    diet controlled  . Hyperlipidemia   . Hypothyroidism   . Pneumonia    3/18  . Thyroid disease    goiter    Patient Active Problem List   Diagnosis Date Noted  . Stable angina ([place]) 04/03/2018  . Right groin pain 06/22/2016  . Acute bronchitis 05/01/2016  . Diastasis recti 10/29/2015  . Rectal bleeding 06/10/2014  . AP (abdominal pain) 12/29/2013  . Crohn's disease ([place]) 12/18/2013    Past Surgical History:  Procedure Laterality Date  . ABDOMINAL HYSTERECTOMY  1987  . APPENDECTOMY    . BREAST BIOPSY Bilateral    neg  . CATARACT EXTRACTION W/PHACO Right 11/06/2016   Procedure: CATARACT EXTRACTION PHACO AND INTRAOCULAR LENS PLACEMENT (IOC);  Surgeon: Birder Robson, MD;  Location: [facility] ORS;  Service: Ophthalmology;  Laterality: Right;  [place] 00:42.6 AP% 16.7 CDE 7.14 Fluid  Pack Lot # [id number]  . CATARACT EXTRACTION W/PHACO Left 12/25/2016   Procedure: CATARACT EXTRACTION PHACO AND INTRAOCULAR LENS PLACEMENT (IOC);  Surgeon: Birder Robson, MD;  Location: [facility] ORS;  Service: Ophthalmology;  Laterality: Left;  [place] 00:32 AP% 13.7 CDE 4.40 Fluid pack lot # [id number]  . COLON SURGERY  06/06/2011   Resection of terminal ileum and previous ileocolonic anastomosis for recurrent Crohn's disease. 3 focal strictures identified.  . COLONOSCOPY  2013  . JOINT REPLACEMENT Left    SHOULDER  . LEFT HEART CATH AND CORONARY ANGIOGRAPHY Left 04/04/2018   Procedure: LEFT HEART CATH AND CORONARY ANGIOGRAPHY;  Surgeon: Corey Skains, MD;  Location: [facility];  Service: Cardiovascular;  Laterality: Left;  . SHOULDER SURGERY Right 11/20/2018   Procedure: R SHOULDER ARTHROSCOPY: biceps tenomyotomy, distal claviculectomy, acromioplasty, posterior capsule release, debridement, decrompression of subacromial space, coracoacromial release, bursectomy  . SMALL INTESTINE SURGERY  2012  . THYROID SURGERY  1992  . TONSILLECTOMY      Prior to Admission medications   Medication Sig Start Date End Date Taking? Authorizing Provider  acetaminophen (TYLENOL) 500 MG tablet Take 1,000 mg by mouth every 8 (eight) hours as needed for mild pain or moderate pain.    [provider]  Adalimumab 40 MG/0.4ML PSKT Inject 40 mg into the skin every 14 (fourteen) days.  10/03/17   [provider]  Ascorbic Acid (VITAMIN C) 1000 MG tablet Take 1,000 mg by mouth 3 (three) times a  week.     [provider]  blood glucose meter kit and supplies KIT Dispense based on patient and insurance preference. Use up to four times daily as directed. 02/26/18   Coral Spikes, DO  cholestyramine Lucrezia Starch) 4 G packet Take 4 g by mouth 2 (two) times daily as needed (stomach problems).     [provider]  CINNAMON PO Take 1,200 mg by mouth daily.    [provider]    cyanocobalamin (,VITAMIN B-12,) 1000 MCG/ML injection Inject 1,000 mcg into the muscle every 30 (thirty) days.    [provider]  Insulin Glargine (LANTUS) 100 UNIT/ML Solostar Pen Inject 10 Units into the skin daily. Patient not taking: Reported on 03/31/2018 02/26/18   Coral Spikes, DO  Insulin Pen Needle 32G X 4 MM MISC Use as directed to administer insulin. 02/26/18   Coral Spikes, DO  magnesium oxide (MAG-OX) 400 MG tablet Take 400 mg by mouth daily.    [provider]  metFORMIN (GLUCOPHAGE) 500 MG tablet Take 1 tablet (500 mg total) by mouth 2 (two) times daily with a meal. 02/26/18 04/27/18  Thersa Salt G, DO  ondansetron (ZOFRAN-ODT) 4 MG disintegrating tablet Take 4 mg by mouth every 8 (eight) hours as needed for nausea or vomiting.  11/16/15   [provider]  SYNTHROID 100 MCG tablet Take 100 mcg by mouth daily before breakfast.  04/27/14   [provider]  THERATEARS 0.25 % SOLN Place 1 drop into both eyes 3 (three) times daily as needed (dry/irritated eyes.).    [provider]  thyroid (ARMOUR) 30 MG tablet Take 30 mg by mouth daily before breakfast.     [provider]  Turmeric 500 MG CAPS Take 500 mg by mouth daily.    [provider]    Allergies Erythromycin, Hydromorphone, Cefuroxime, Ciprocinonide [fluocinolone], Codeine, Floxin [ofloxacin], Keflex [cephalexin], Levofloxacin, Sulfa antibiotics, and Ciprofloxacin  Family History  Problem Relation Age of Onset  . Breast cancer Sister 22    Social History Social History   Tobacco Use  . Smoking status: Former Smoker    Packs/day: 1.00    Years: 12.00    Pack years: 12.00    Types: Cigarettes  . Smokeless tobacco: Never Used  Substance Use Topics  . Alcohol use: No  . Drug use: No    Review of Systems Constitutional: No fever/chills Eyes: No visual changes. ENT: No sore throat. Cardiovascular: Denies chest pain. Respiratory: Denies shortness of  breath. Gastrointestinal: No abdominal pain.  No nausea, no vomiting.  No diarrhea.  No constipation. Genitourinary: Negative for dysuria. Musculoskeletal: Negative for neck pain.  Negative for back pain. Integumentary: Negative for rash. Neurological: Negative for headaches, focal weakness or numbness.  ____________________________________________   PHYSICAL EXAM:  VITAL SIGNS: ED Triage Vitals  Enc Vitals Group     BP 04/27/19 2346 (!) 161/79     Pulse Rate 04/27/19 2346 84     Resp 04/27/19 2346 20     Temp --      Temp Source 04/27/19 2346 Oral     SpO2 04/27/19 2346 99 %     Weight 04/27/19 2347 81.6 kg (180 lb)     Height 04/27/19 2347 1.727 m ('5\' 8"'$ )     Head Circumference --      Peak Flow --      Pain Score 04/27/19 2347 0     Pain Loc --      Pain  Edu? --      Excl. in [place]? --     Constitutional: Alert and oriented.  Eyes: Conjunctivae are normal.  Mouth/Throat: Patient is wearing a mask. Neck: No stridor.  No meningeal signs.   Cardiovascular: Normal rate, regular rhythm. Good peripheral circulation. Grossly normal heart sounds. Respiratory: Normal respiratory effort.  No retractions. Gastrointestinal: Soft and nontender. No distention.  Musculoskeletal: No lower extremity tenderness nor edema. No gross deformities of extremities. Neurologic:  Normal speech and language. No gross focal neurologic deficits are appreciated.  Skin:  Skin is warm, dry and intact. Psychiatric: Mood and affect are normal. Speech and behavior are normal.  ____________________________________________   LABS (all labs ordered are listed, but only abnormal results are displayed)  Labs Reviewed  BASIC METABOLIC PANEL - Abnormal; Notable for the following components:      Result Value   Glucose, Bld 348 (*)    BUN 25 (*)    Calcium 8.6 (*)    All other components within normal limits  URINALYSIS, COMPLETE (UACMP) WITH MICROSCOPIC - Abnormal; Notable for the following components:    Leukocytes,Ua TRACE (*)    All other components within normal limits  GLUCOSE, CAPILLARY - Abnormal; Notable for the following components:   Glucose-Capillary 331 (*)    All other components within normal limits  GLUCOSE, CAPILLARY - Abnormal; Notable for the following components:   Glucose-Capillary 248 (*)    All other components within normal limits  CBC  CBG MONITORING, ED  CBG MONITORING, ED  CBG MONITORING, ED     Procedures   ____________________________________________   INITIAL IMPRESSION / MDM / ASSESSMENT AND PLAN / ED COURSE  As part of my medical decision making, I reviewed the following data within the electronic MEDICAL RECORD NUMBER   74 year old female presented with above-stated history and physical exam secondary to hyperglycemia induced by prednisone.  Patient's glucose was 348 on laboratory data now 248.  Patient will be given Metformin 500 mg and advised to take Metformin 500 mg twice daily for the duration of time that she will be on prednisone.  Patient also  has appointment with primary care provider today  ____________________________________________  FINAL CLINICAL IMPRESSION(S) / ED DIAGNOSES  Final diagnoses:  Hyperglycemia     MEDICATIONS GIVEN DURING THIS VISIT:  Medications - No data to display   ED Discharge Orders    None      *Please note:  Joanna Ortiz was evaluated in Emergency Department on 04/28/2019 for the symptoms described in the history of present illness. She was evaluated in the context of the global COVID-19 pandemic, which necessitated consideration that the patient might be at risk for infection with the SARS-CoV-2 virus that causes COVID-19. Institutional protocols and algorithms that pertain to the evaluation of patients at risk for COVID-19 are in a state of rapid change based on information released by regulatory bodies including the CDC and federal and state organizations. These policies and algorithms were followed  during the patient's care in the ED.  Some ED evaluations and interventions may be delayed as a result of limited staffing during the pandemic.*  Note:  This document was prepared using Dragon voice recognition software and may include unintentional dictation errors.   Gregor Hams, MD 04/28/19 [phone]

## 2019-05-04 ENCOUNTER — Other Ambulatory Visit: Payer: Self-pay | Admitting: Family Medicine

## 2019-06-09 ENCOUNTER — Other Ambulatory Visit: Payer: Self-pay | Admitting: Family Medicine

## 2019-08-13 ENCOUNTER — Other Ambulatory Visit: Payer: Self-pay

## 2019-08-13 ENCOUNTER — Other Ambulatory Visit
Admission: RE | Admit: 2019-08-13 | Discharge: 2019-08-13 | Disposition: A | Discharge status: Home / Self Care | Payer: Federal, State, Local not specified - PPO | Attending: Otolaryngology | Admitting: Otolaryngology

## 2019-08-13 DIAGNOSIS — E890 Postprocedural hypothyroidism: Secondary | ICD-10-CM | POA: Insufficient documentation

## 2019-08-13 LAB — T4, FREE: Free T4: 0.8 ng/dL (ref 0.61–1.12)

## 2019-08-13 LAB — TSH: TSH: 2.188 u[IU]/mL (ref 0.350–4.500)

## 2019-08-14 LAB — T3, FREE: T3, Free: 2.9 pg/mL (ref 2.0–4.4)

## 2019-09-16 ENCOUNTER — Other Ambulatory Visit: Payer: Self-pay | Admitting: Family Medicine

## 2019-09-16 DIAGNOSIS — Z780 Asymptomatic menopausal state: Secondary | ICD-10-CM

## 2019-10-08 ENCOUNTER — Other Ambulatory Visit: Payer: Self-pay

## 2019-10-08 ENCOUNTER — Ambulatory Visit
Admission: RE | Admit: 2019-10-08 | Discharge: 2019-10-08 | Disposition: A | Discharge status: Home / Self Care | Payer: Federal, State, Local not specified - PPO | Source: Ambulatory Visit | Attending: Internal Medicine | Admitting: Internal Medicine

## 2019-10-08 VITALS — BP 132/93 | HR 108 | Temp 99.0°F | Resp 20 | Ht 68.0 in | Wt 174.0 lb

## 2019-10-08 DIAGNOSIS — N3000 Acute cystitis without hematuria: Secondary | ICD-10-CM

## 2019-10-08 DIAGNOSIS — N390 Urinary tract infection, site not specified: Secondary | ICD-10-CM | POA: Diagnosis not present

## 2019-10-08 DIAGNOSIS — R509 Fever, unspecified: Secondary | ICD-10-CM | POA: Diagnosis not present

## 2019-10-08 DIAGNOSIS — R0981 Nasal congestion: Secondary | ICD-10-CM

## 2019-10-08 DIAGNOSIS — B349 Viral infection, unspecified: Secondary | ICD-10-CM | POA: Diagnosis not present

## 2019-10-08 LAB — URINALYSIS, COMPLETE (UACMP) WITH MICROSCOPIC
Bilirubin Urine: NEGATIVE
Glucose, UA: NEGATIVE mg/dL
Hgb urine dipstick: NEGATIVE
Ketones, ur: NEGATIVE mg/dL
Nitrite: POSITIVE — AB
Protein, ur: NEGATIVE mg/dL
RBC / HPF: NONE SEEN RBC/hpf (ref 0–5)
Specific Gravity, Urine: 1.02 (ref 1.005–1.030)
pH: 5 (ref 5.0–8.0)

## 2019-10-08 MED ORDER — NITROFURANTOIN MONOHYD MACRO 100 MG PO CAPS: 100.0000 mg | ORAL_CAPSULE | Freq: Two times a day (BID) | ORAL | 0 refills | Status: DC

## 2019-10-08 NOTE — ED Provider Notes (Signed)
[facility]    CSN: [id number] Arrival date & time: 10/08/19  1458      History   Chief Complaint Chief Complaint  Patient presents with   Urinary Tract Infection   Fever   Nasal Congestion    HPI Joanna Ortiz is a 74 y.o. female comes to the urgent care with complaints of dysuria, urgency, frequency of a few days duration.  Patient denies any abdominal pain, flank pain, nausea or vomiting.  No dizziness or near syncopal episodes.  Patient also planes of urinary incontinence which is new for her.  Patient started experiencing nasal congestion, nonproductive cough, postnasal drainage, chills with no fever.  Patient was tested for Covid in the community and the test results are pending.  She denies any nausea, vomiting or diarrhea.  Patient has a history of Crohn's disease on Humira.  She is not vaccinated against COVID-19 virus.  She has multiple drug allergies and is very concerned that she may have a severe allergic reaction to the COVID-19 vaccine.Marland Kitchen   HPI  Past Medical History:  Diagnosis Date   Anemia    Arthritis    Collagen vascular disease ([place])    Coronary artery disease    Crohn disease ([place])    Diabetes mellitus without complication ([place])    diet controlled   Hyperlipidemia    Hypothyroidism    Pneumonia    3/18   Thyroid disease    goiter    Patient Active Problem List   Diagnosis Date Noted   Stable angina ([place]) 04/03/2018   Right groin pain 06/22/2016   Acute bronchitis 05/01/2016   Diastasis recti 10/29/2015   Rectal bleeding 06/10/2014   AP (abdominal pain) 12/29/2013   Crohn's disease ([place]) 12/18/2013    Past Surgical History:  Procedure Laterality Date   ABDOMINAL HYSTERECTOMY  1987   APPENDECTOMY     BREAST BIOPSY Bilateral    neg   CATARACT EXTRACTION W/PHACO Right 11/06/2016   Procedure: CATARACT EXTRACTION PHACO AND INTRAOCULAR LENS PLACEMENT ([place]);  Surgeon: Birder Robson, MD;  Location: [facility]  ORS;  Service: Ophthalmology;  Laterality: Right;  [place] 00:42.6 AP% 16.7 CDE 7.14 Fluid Pack Lot # [id number]   CATARACT EXTRACTION W/PHACO Left 12/25/2016   Procedure: CATARACT EXTRACTION PHACO AND INTRAOCULAR LENS PLACEMENT (IOC);  Surgeon: Birder Robson, MD;  Location: [facility] ORS;  Service: Ophthalmology;  Laterality: Left;  [place] 00:32 AP% 13.7 CDE 4.40 Fluid pack lot # [id number]   COLON SURGERY  06/06/2011   Resection of terminal ileum and previous ileocolonic anastomosis for recurrent Crohn's disease. 3 focal strictures identified.   COLONOSCOPY  2013   JOINT REPLACEMENT Left    SHOULDER   LEFT HEART CATH AND CORONARY ANGIOGRAPHY Left 04/04/2018   Procedure: LEFT HEART CATH AND CORONARY ANGIOGRAPHY;  Surgeon: Corey Skains, MD;  Location: [facility];  Service: Cardiovascular;  Laterality: Left;   SHOULDER SURGERY Right 11/20/2018   Procedure: R SHOULDER ARTHROSCOPY: biceps tenomyotomy, distal claviculectomy, acromioplasty, posterior capsule release, debridement, decrompression of subacromial space, coracoacromial release, bursectomy   SMALL INTESTINE SURGERY  2012   THYROID SURGERY  1992   TONSILLECTOMY      OB History    Gravida  6   Para  4   Term      Preterm      AB  2   Living  4     SAB  2   TAB      Ectopic  Multiple      Live Births           Obstetric Comments  1st Menstrual Cycle:  14  1st Pregnancy: 19         Home Medications    Prior to Admission medications   Medication Sig Start Date End Date Taking? Authorizing Provider  acetaminophen (TYLENOL) 500 MG tablet Take 1,000 mg by mouth every 8 (eight) hours as needed for mild pain or moderate pain.   Yes [provider]  Adalimumab 40 MG/0.4ML PSKT Inject 40 mg into the skin every 14 (fourteen) days.  10/03/17  Yes [provider]  Ascorbic Acid (VITAMIN C) 1000 MG tablet Take 1,000 mg by mouth 3 (three) times a week.    Yes [provider]    blood glucose meter kit and supplies KIT Dispense based on patient and insurance preference. Use up to four times daily as directed. 02/26/18  Yes Cook, Jayce G, DO  cholestyramine Lucrezia Starch) 4 G packet Take 4 g by mouth 2 (two) times daily as needed (stomach problems).    Yes [provider]  CINNAMON PO Take 1,200 mg by mouth daily.   Yes [provider]  cyanocobalamin (,VITAMIN B-12,) 1000 MCG/ML injection Inject 1,000 mcg into the muscle every 30 (thirty) days.   Yes [provider]  Insulin Glargine (LANTUS) 100 UNIT/ML Solostar Pen Inject 10 Units into the skin daily. 02/26/18  Yes Cook, Jayce G, DO  Insulin Pen Needle 32G X 4 MM MISC Use as directed to administer insulin. 02/26/18  Yes Cook, Jayce G, DO  magnesium oxide (MAG-OX) 400 MG tablet Take 400 mg by mouth daily.   Yes [provider]  ondansetron (ZOFRAN-ODT) 4 MG disintegrating tablet Take 4 mg by mouth every 8 (eight) hours as needed for nausea or vomiting.  11/16/15  Yes [provider]  SYNTHROID 100 MCG tablet Take 100 mcg by mouth daily before breakfast.  04/27/14  Yes [provider]  THERATEARS 0.25 % SOLN Place 1 drop into both eyes 3 (three) times daily as needed (dry/irritated eyes.).   Yes [provider]  thyroid (ARMOUR) 30 MG tablet Take 30 mg by mouth daily before breakfast.    Yes [provider]  Turmeric 500 MG CAPS Take 500 mg by mouth daily.   Yes [provider]  metFORMIN (GLUCOPHAGE) 500 MG tablet Take 1 tablet (500 mg total) by mouth 2 (two) times daily with a meal. 02/26/18 04/27/18  Coral Spikes, DO  nitrofurantoin, macrocrystal-monohydrate, (MACROBID) 100 MG capsule Take 1 capsule (100 mg total) by mouth 2 (two) times daily. 10/08/19   Cleta Heatley, Myrene Galas, MD    Family History Family History  Problem Relation Age of Onset   Breast cancer Sister 31    Social History Social History   Tobacco Use   Smoking status: Former Smoker     Packs/day: 1.00    Years: 12.00    Pack years: 12.00    Types: Cigarettes   Smokeless tobacco: Never Used  Scientific laboratory technician Use: Never used  Substance Use Topics   Alcohol use: No   Drug use: No     Allergies   Erythromycin, Hydromorphone, Cefuroxime, Ciprocinonide [fluocinolone], Codeine, Floxin [ofloxacin], Keflex [cephalexin], Levofloxacin, Sulfa antibiotics, and Ciprofloxacin   Review of Systems Review of Systems  Constitutional: Positive for chills and diaphoresis.  HENT: Positive for congestion and sinus pressure. Negative for ear discharge, ear pain, sore throat and  voice change.   Respiratory: Negative.   Gastrointestinal: Negative.   Genitourinary: Positive for dysuria, frequency and urgency. Negative for vaginal discharge.  Musculoskeletal: Positive for arthralgias and myalgias.  Neurological: Negative.      Physical Exam Triage Vital Signs ED Triage Vitals  Enc Vitals Group     BP 10/08/19 1512 (!) 132/93     Pulse Rate 10/08/19 1512 (!) 108     Resp 10/08/19 1512 20     Temp 10/08/19 1512 99 F (37.2 C)     Temp Source 10/08/19 1512 Oral     SpO2 10/08/19 1512 97 %     Weight 10/08/19 1513 174 lb (78.9 kg)     Height 10/08/19 1513 $RemoveBefor'5\' 8"'YNVPqgrdtSdy$  (1.727 m)     Head Circumference --      Peak Flow --      Pain Score 10/08/19 1512 5     Pain Loc --      Pain Edu? --      Excl. in [place]? --    No data found.  Updated Vital Signs BP (!) 132/93 (BP Location: Right Arm)    Pulse (!) 108    Temp 99 F (37.2 C) (Oral)    Resp 20    Ht $R'5\' 8"'OA$  (1.727 m)    Wt 78.9 kg    SpO2 97%    BMI 26.46 kg/m   Visual Acuity Right Eye Distance:   Left Eye Distance:   Bilateral Distance:    Right Eye Near:   Left Eye Near:    Bilateral Near:     Physical Exam Vitals and nursing note reviewed.  Constitutional:      General: She is in acute distress.     Appearance: She is ill-appearing.  Cardiovascular:     Rate and Rhythm: Normal rate and regular rhythm.      Pulses: Normal pulses.     Heart sounds: Normal heart sounds.  Pulmonary:     Effort: Pulmonary effort is normal. No respiratory distress.     Breath sounds: Normal breath sounds. No wheezing or rales.  Abdominal:     General: Bowel sounds are normal.     Palpations: Abdomen is soft.  Musculoskeletal:        General: Normal range of motion.  Skin:    Capillary Refill: Capillary refill takes less than 2 seconds.  Neurological:     Mental Status: She is alert.      UC Treatments / Results  Labs (all labs ordered are listed, but only abnormal results are displayed) Labs Reviewed  URINALYSIS, COMPLETE (UACMP) WITH MICROSCOPIC - Abnormal; Notable for the following components:      Result Value   APPearance HAZY (*)    Nitrite POSITIVE (*)    Leukocytes,Ua SMALL (*)    Bacteria, UA MANY (*)    All other components within normal limits    EKG   Radiology No results found.  Procedures Procedures (including critical care time)  Medications Ordered in UC Medications - No data to display  Initial Impression / Assessment and Plan / UC Course  I have reviewed the triage vital signs and the nursing notes.  Pertinent labs & imaging results that were available during my care of the patient were reviewed by me and considered in my medical decision making (see chart for details).    1.  Acute cystitis without hematuria: Urine culture sent Point-of-care urinalysis is significant for leukocyte esterase, nitrite, many bacteria, WBC 21-40  Nitrofurantoin 100 mg twice daily x5 days If cultures require changing antibiotics-we will call the patient Return precautions given  2.  Viral pharyngitis: COVID-19 PCR test is pending Patient is advised to self quarantine If patient is positive she will qualify for monoclonal antibody infusions. Patient is advised to call the urgent care if her COVID-19 test is positive. Final Clinical Impressions(s) / UC Diagnoses   Final diagnoses:   Acute cystitis without hematuria  Viral illness     Discharge Instructions     Quarantine until covid -19 test is available Push oral fluids  If symptoms worsen,please return to urgent care to be re-evaluated If your covid-19 test is positive ,please give [place] a call so we can connect you to our monoclonal antibody infusion clinic.   ED Prescriptions    Medication Sig Dispense Auth. Provider   nitrofurantoin, macrocrystal-monohydrate, (MACROBID) 100 MG capsule Take 1 capsule (100 mg total) by mouth 2 (two) times daily. 10 capsule Charlee Whitebread, Myrene Galas, MD     PDMP not reviewed this encounter.   Chase Picket, MD 10/08/19 [phone]

## 2019-10-08 NOTE — ED Triage Notes (Signed)
Patient in today w/ c/o UTI sx, H/A, nausea, sinus congestion and drainage, and cough. Patient was tested earlier today @ another facility for COVID and was negative. Sx onset approx. 3 days ago. Patient denies loss of taste or smell, vomiting.

## 2019-10-08 NOTE — Discharge Instructions (Signed)
Quarantine until covid -19 test is available Push oral fluids  If symptoms worsen,please return to urgent care to be re-evaluated If your covid-19 test is positive ,please give [place] a call so we can connect you to our monoclonal antibody infusion clinic.

## 2019-10-09 ENCOUNTER — Encounter: Payer: Self-pay | Admitting: Hospice and Palliative Medicine

## 2019-10-09 ENCOUNTER — Other Ambulatory Visit: Payer: Self-pay | Admitting: Hospice and Palliative Medicine

## 2019-10-09 ENCOUNTER — Ambulatory Visit (HOSPITAL_COMMUNITY)
Admission: RE | Admit: 2019-10-09 | Discharge: 2019-10-09 | Disposition: A | Discharge status: Home / Self Care | Payer: Federal, State, Local not specified - PPO | Source: Ambulatory Visit | Attending: Pulmonary Disease | Admitting: Pulmonary Disease

## 2019-10-09 DIAGNOSIS — U071 COVID-19: Secondary | ICD-10-CM | POA: Diagnosis not present

## 2019-10-09 MED ORDER — ALBUTEROL SULFATE HFA 108 (90 BASE) MCG/ACT IN AERS: 2.0000 | INHALATION_SPRAY | Freq: Once | RESPIRATORY_TRACT | Status: DC | PRN

## 2019-10-09 MED ORDER — DIPHENHYDRAMINE HCL 50 MG/ML IJ SOLN: 50.0000 mg | Freq: Once | INTRAMUSCULAR | Status: DC | PRN

## 2019-10-09 MED ORDER — METHYLPREDNISOLONE SODIUM SUCC 125 MG IJ SOLR: 125.0000 mg | Freq: Once | INTRAMUSCULAR | Status: DC | PRN

## 2019-10-09 MED ORDER — SODIUM CHLORIDE 0.9 % IV SOLN: INTRAVENOUS | Status: DC | PRN

## 2019-10-09 MED ORDER — FAMOTIDINE IN NACL 20-0.9 MG/50ML-% IV SOLN: 20.0000 mg | Freq: Once | INTRAVENOUS | Status: DC | PRN

## 2019-10-09 MED ORDER — SODIUM CHLORIDE 0.9 % IV SOLN
1200.0000 mg | Freq: Once | INTRAVENOUS | Status: AC
  Administered 2019-10-09: 1200 mg via INTRAVENOUS
  Filled 2019-10-09: qty 10

## 2019-10-09 MED ORDER — EPINEPHRINE 0.3 MG/0.3ML IJ SOAJ: 0.3000 mg | Freq: Once | INTRAMUSCULAR | Status: DC | PRN

## 2019-10-09 NOTE — Progress Notes (Signed)
I connected by phone with   Joanna Ortiz on 10/09/2019 to discuss the potential use of an new treatment for mild to moderate COVID-19 viral infection in non-hospitalized patients.   This patient is a age/sex that meets the FDA criteria for Emergency Use Authorization of casirivimab\imdevimab.  Has a (+) direct SARS-CoV-2 viral test result 1. Has mild or moderate COVID-19  2. Is ? 75 years of age and weighs ? 40 kg 3. Is NOT hospitalized due to COVID-19 4. Is NOT requiring oxygen therapy or requiring an increase in baseline oxygen flow rate due to COVID-19 5. Is within 10 days of symptom onset 6. Has at least one of the high risk factor(s) for progression to severe COVID-19 and/or hospitalization as defined in EUA. ? Specific high risk criteria : Crohn's disease on immunosuppressants, age: 79   Symptom onset  : 8/18 with cough, congestion, headache, fever/chills, myalgias, and fatigue.  Seen yesterday in urgent care with rapid test positive.   I have spoken and communicated the following to the patient or parent/caregiver:   1. FDA has authorized the emergency use of casirivimab\imdevimab for the treatment of mild to moderate COVID-19 in adults and pediatric patients with positive results of direct SARS-CoV-2 viral testing who are 63 years of age and older weighing at least 40 kg, and who are at high risk for progressing to severe COVID-19 and/or hospitalization.   2. The significant known and potential risks and benefits of casirivimab\imdevimab, and the extent to which such potential risks and benefits are unknown.   3. Information on available alternative treatments and the risks and benefits of those alternatives, including clinical trials.   4. Patients treated with casirivimab\imdevimab should continue to self-isolate and use infection control measures (e.g., wear mask, isolate, social distance, avoid sharing personal items, clean and disinfect high touch surfaces, and frequent  handwashing) according to CDC guidelines.    5. The patient or parent/caregiver has the option to accept or refuse casirivimab\imdevimab .   After reviewing this information with the patient, The patient agreed to proceed with receiving casirivimab\imdevimab infusion and will be provided a copy of the Fact sheet prior to receiving the infusion.Altha Harm, PhD, NP-C [phone] ([place])

## 2019-10-09 NOTE — Discharge Instructions (Signed)

## 2019-10-11 LAB — URINE CULTURE: Culture: >=100000 — AB

## 2019-10-12 ENCOUNTER — Telehealth (HOSPITAL_COMMUNITY): Payer: Self-pay | Admitting: Emergency Medicine

## 2019-10-12 MED ORDER — AMPICILLIN 500 MG PO CAPS: 500.0000 mg | ORAL_CAPSULE | Freq: Four times a day (QID) | ORAL | 0 refills | Status: AC

## 2019-10-12 NOTE — Progress Notes (Signed)
Patient sent home on Macrobid.  Will need to change antibiotic, patient allergic to other two protocol antibiotics.  Will reach out to provider for further guidance.     Attempted to call patient x 1 to review, no answer, LVM

## 2019-10-20 ENCOUNTER — Ambulatory Visit (INDEPENDENT_AMBULATORY_CARE_PROVIDER_SITE_OTHER): Payer: Federal, State, Local not specified - PPO

## 2019-10-20 ENCOUNTER — Other Ambulatory Visit: Payer: Self-pay

## 2019-10-20 ENCOUNTER — Ambulatory Visit
Admission: RE | Admit: 2019-10-20 | Discharge: 2019-10-20 | Disposition: A | Discharge status: Home / Self Care | Payer: Federal, State, Local not specified - PPO | Source: Ambulatory Visit | Attending: Emergency Medicine | Admitting: Emergency Medicine

## 2019-10-20 VITALS — BP 144/77 | HR 90 | Temp 98.6°F | Resp 18 | Ht 68.0 in | Wt 174.0 lb

## 2019-10-20 DIAGNOSIS — U071 COVID-19: Secondary | ICD-10-CM

## 2019-10-20 DIAGNOSIS — R0602 Shortness of breath: Secondary | ICD-10-CM | POA: Diagnosis not present

## 2019-10-20 DIAGNOSIS — R05 Cough: Secondary | ICD-10-CM

## 2019-10-20 DIAGNOSIS — J0100 Acute maxillary sinusitis, unspecified: Secondary | ICD-10-CM | POA: Diagnosis not present

## 2019-10-20 DIAGNOSIS — R059 Cough, unspecified: Secondary | ICD-10-CM

## 2019-10-20 MED ORDER — AEROCHAMBER PLUS MISC: 2 refills | Status: DC

## 2019-10-20 MED ORDER — AMOXICILLIN-POT CLAVULANATE 875-125 MG PO TABS: 1.0000 | ORAL_TABLET | Freq: Two times a day (BID) | ORAL | 0 refills | Status: DC

## 2019-10-20 NOTE — ED Triage Notes (Addendum)
Patient in today with continued cough and sob from covid. Patient tested positive on 10/07/19. Patient has been using an Albuterol inhaler, Mucinex, Tylenol, Tessalon Perles and Nasocort without relief. Patient states she had the antibody infusion at [facility] on 10/09/19.

## 2019-10-20 NOTE — ED Provider Notes (Signed)
HPI  SUBJECTIVE:  Joanna Ortiz is a 74 y.o. female who presents with cough productive of dark yellowish-brown material for the past 11 days.  She states that she "cannot get rid of this cold".  Symptoms started on 8/4.  She reports nasal congestion, thick rhinorrhea, postnasal drip, wheezing starting today.  No fevers, sinus pain or pressure, facial swelling, upper dental pain, headaches, body aches, sore throat no shortness of breath, chest pain.  She states that her oxygen saturation at home has been "okay".  She is concerned because her husband who is also Covid positive was diagnosed with pneumonia yesterday.  She has tried Nasacort, albuterol, Mucinex D, vitamin C, D, Tessalon.  She does not have a spacer at home.  She has been using the albuterol every 6 hours.  No aggravating or alleviating factors.  Patient was seen here in 8/19 for URI-like symptoms.  Covid test came back positive on 8/20 and she received acasirivimab\imdevimab infusion.  Contacted her PMDs office on 8/24 for weakness and fatigue, 911 was called.  Patient decided to not go to the ED.  Patient has a past medical history of Crohn's disease -has not taken Humira since beginning of August and is no longer on the prednisone.  She has a history of diabetes on insulin sliding scale, pneumonia.  No history of hypertension, pulmonary disease, smoking, chronic kidney disease.  VOZ:DGUYQI, Rubbie Battiest, MD   Past Medical History:  Diagnosis Date  . Anemia   . Arthritis   . Collagen vascular disease ([place])   . Coronary artery disease   . Crohn disease ([place])   . Diabetes mellitus without complication (HCC)    diet controlled  . Hyperlipidemia   . Hypothyroidism   . Pneumonia    3/18  . Thyroid disease    goiter    Past Surgical History:  Procedure Laterality Date  . ABDOMINAL HYSTERECTOMY  1987  . APPENDECTOMY    . BREAST BIOPSY Bilateral    neg  . CATARACT EXTRACTION W/PHACO Right 11/06/2016   Procedure: CATARACT  EXTRACTION PHACO AND INTRAOCULAR LENS PLACEMENT (IOC);  Surgeon: Birder Robson, MD;  Location: [facility];  Service: Ophthalmology;  Laterality: Right;  [place] 00:42.6 AP% 16.7 CDE 7.14 Fluid Pack Lot # [id number]  . CATARACT EXTRACTION W/PHACO Left 12/25/2016   Procedure: CATARACT EXTRACTION PHACO AND INTRAOCULAR LENS PLACEMENT (IOC);  Surgeon: Birder Robson, MD;  Location: [facility];  Service: Ophthalmology;  Laterality: Left;  [place] 00:32 AP% 13.7 CDE 4.40 Fluid pack lot # [id number]  . COLON SURGERY  06/06/2011   Resection of terminal ileum and previous ileocolonic anastomosis for recurrent Crohn's disease. 3 focal strictures identified.  . COLONOSCOPY  2013  . JOINT REPLACEMENT Left    SHOULDER  . LEFT HEART CATH AND CORONARY ANGIOGRAPHY Left 04/04/2018   Procedure: LEFT HEART CATH AND CORONARY ANGIOGRAPHY;  Surgeon: Corey Skains, MD;  Location: [facility];  Service: Cardiovascular;  Laterality: Left;  . SHOULDER SURGERY Right 11/20/2018   Procedure: R SHOULDER ARTHROSCOPY: biceps tenomyotomy, distal claviculectomy, acromioplasty, posterior capsule release, debridement, decrompression of subacromial space, coracoacromial release, bursectomy  . SMALL INTESTINE SURGERY  2012  . THYROID SURGERY  1992  . TONSILLECTOMY      Family History  Problem Relation Age of Onset  . Breast cancer Sister 72  . Crohn's disease Mother   . Heart attack Father 36    Social History   Tobacco Use  . Smoking status: Former Smoker  Packs/day: 1.00    Years: 12.00    Pack years: 12.00    Types: Cigarettes    Quit date: 10/19/1969    Years since quitting: 50.0  . Smokeless tobacco: Never Used  Vaping Use  . Vaping Use: Never used  Substance Use Topics  . Alcohol use: No  . Drug use: No    No current facility-administered medications for this encounter.  Current Outpatient Medications:  .  acetaminophen (TYLENOL) 500 MG tablet, Take 1,000 mg by mouth every 8 (eight) hours as  needed for mild pain or moderate pain., Disp: , Rfl:  .  Adalimumab 40 MG/0.4ML PSKT, Inject 40 mg into the skin every 14 (fourteen) days. , Disp: , Rfl:  .  Ascorbic Acid (VITAMIN C) 1000 MG tablet, Take 1,000 mg by mouth 3 (three) times a week. , Disp: , Rfl:  .  cholestyramine (QUESTRAN) 4 G packet, Take 4 g by mouth 2 (two) times daily as needed (stomach problems). , Disp: , Rfl: 12 .  CINNAMON PO, Take 1,200 mg by mouth daily., Disp: , Rfl:  .  cyanocobalamin (,VITAMIN B-12,) 1000 MCG/ML injection, Inject 1,000 mcg into the muscle every 30 (thirty) days., Disp: , Rfl:  .  Insulin Glargine (LANTUS) 100 UNIT/ML Solostar Pen, Inject 10 Units into the skin daily., Disp: 15 mL, Rfl: 1 .  magnesium oxide (MAG-OX) 400 MG tablet, Take 400 mg by mouth daily., Disp: , Rfl:  .  metFORMIN (GLUCOPHAGE) 500 MG tablet, Take 1 tablet (500 mg total) by mouth 2 (two) times daily with a meal., Disp: 60 tablet, Rfl: 1 .  ondansetron (ZOFRAN-ODT) 4 MG disintegrating tablet, Take 4 mg by mouth every 8 (eight) hours as needed for nausea or vomiting. , Disp: , Rfl:  .  SYNTHROID 100 MCG tablet, Take 100 mcg by mouth daily before breakfast. , Disp: , Rfl: 11 .  THERATEARS 0.25 % SOLN, Place 1 drop into both eyes 3 (three) times daily as needed (dry/irritated eyes.)., Disp: , Rfl:  .  thyroid (ARMOUR) 30 MG tablet, Take 30 mg by mouth daily before breakfast. , Disp: , Rfl:  .  Turmeric 500 MG CAPS, Take 500 mg by mouth daily., Disp: , Rfl:  .  albuterol (VENTOLIN HFA) 108 (90 Base) MCG/ACT inhaler, Inhale 1-2 puffs into the lungs every 4 (four) hours as needed for wheezing or shortness of breath., Disp: 1 each, Rfl: 0 .  amoxicillin-clavulanate (AUGMENTIN) 875-125 MG tablet, Take 1 tablet by mouth 2 (two) times daily. X 7 days, Disp: 14 tablet, Rfl: 0 .  blood glucose meter kit and supplies KIT, Dispense based on patient and insurance preference. Use up to four times daily as directed., Disp: 1 each, Rfl: 0 .  Insulin  Pen Needle 32G X 4 MM MISC, Use as directed to administer insulin., Disp: 100 each, Rfl: 1 .  Spacer/Aero-Holding Chambers (AEROCHAMBER PLUS) inhaler, Use as instructed, Disp: 1 each, Rfl: 2  Allergies  Allergen Reactions  . Erythromycin Hives  . Hydromorphone Hives  . Cefuroxime Nausea And Vomiting  . Ciprocinonide [Fluocinolone]     Muscle pain   . Codeine     Other reaction(s): Vomiting  . Floxin [Ofloxacin] Nausea And Vomiting  . Keflex [Cephalexin] Nausea And Vomiting  . Levofloxacin Nausea And Vomiting  . Sulfa Antibiotics Nausea And Vomiting  . Ciprofloxacin Nausea And Vomiting     ROS  As noted in HPI.   Physical Exam  BP (!) 144/77 (BP Location: Left  Arm)   Pulse 90   Temp 98.6 F (37 C) (Oral)   Resp 18   Ht $R'5\' 8"'gB$  (1.727 m)   Wt 78.9 kg   SpO2 99%   BMI 26.46 kg/m   Constitutional: Well developed, well nourished, no acute distress Eyes:  EOMI, conjunctiva normal bilaterally HENT: Normocephalic, atraumatic,mucus membranes moist. erythematous turbinates.  Positive purulent nasal congestion.  Positive maxillary sinus tenderness.  Positive cobblestoning and postnasal drip. Respiratory: Normal inspiratory effort, lungs clear bilaterally, good air movement.  Positive lateral chest wall tenderness Cardiovascular: Normal rate regular rhythm, no murmurs rubs or gallops. GI: nondistended skin: No rash, skin intact Musculoskeletal: no deformities Neurologic: Alert & oriented x 3, no focal neuro deficits Psychiatric: Speech and behavior appropriate   ED Course   Medications - No data to display  Orders Placed This Encounter  Procedures  . DG Chest 2 View    COVID + r/o PNA    Standing Status:   Standing    Number of Occurrences:   1    Order Specific Question:   Symptom/Reason for Exam    Answer:   Cough [355.2.ICD-9-CM]    No results found for this or any previous visit (from the past 24 hour(s)). No results found.  ED Clinical Impression  1. Acute  non-recurrent maxillary sinusitis   2. Cough   3. COVID-19 virus infection      ED Assessment/Plan  Previous records reviewed.  As noted in HPI.  will check chest x-ray.  Presentation consistent with a sinusitis most likely from Covid infection.  Plan to send home on Augmentin for 7 days.  She is to use her albuterol inhaler every 4-6 hours with a spacer.  Will prescribe spacer.  Saline nasal irrigation, continue Nasonex, Mucinex D.  She is to use the pulse ox that she already has at home, she is to go to the ER if it is consistently below 90%.  Discussed with her that the cough may linger for several more weeks.  Reviewed imaging independently.  No lobar consolidation.  See radiology report for full details.  Plan as above.  Discussed imaging, MDM, treatment plan, and plan for follow-up with patient. Discussed sn/sx that should prompt return to the ED. patient agrees with plan.   Meds ordered this encounter  Medications  . amoxicillin-clavulanate (AUGMENTIN) 875-125 MG tablet    Sig: Take 1 tablet by mouth 2 (two) times daily. X 7 days    Dispense:  14 tablet    Refill:  0  . Spacer/Aero-Holding Chambers (AEROCHAMBER PLUS) inhaler    Sig: Use as instructed    Dispense:  1 each    Refill:  2    *This clinic note was created using Dragon dictation software. Therefore, there may be occasional mistakes despite careful proofreading.   ?    Melynda Ripple, MD 10/23/19 [phone]

## 2019-10-20 NOTE — Discharge Instructions (Addendum)
Your x-ray was negative for pneumonia, so I am sending you home only on the Augmentin.  2 puffs with your albuterol inhaler using your spacer every 4 hours for 2 days, then every 6 hours for 2 days, then as needed.  Continue Nasonex, Mucinex D, Tessalon.  Start saline nasal irrigation with a Milta Deiters med rinse and distilled water as often as you want.  Use your pulse oximeter.  Go to the ER if it is consistently below 90%.

## 2019-10-21 ENCOUNTER — Telehealth: Payer: Self-pay | Admitting: Emergency Medicine

## 2019-10-21 MED ORDER — ALBUTEROL SULFATE HFA 108 (90 BASE) MCG/ACT IN AERS: 1.0000 | INHALATION_SPRAY | RESPIRATORY_TRACT | 0 refills | Status: DC | PRN

## 2019-10-21 NOTE — Telephone Encounter (Signed)
Forgot to prescribe albuterol inhaler yesterday during visit.  We will E prescribe one today pharmacy on record.  Melynda Ripple, MD.

## 2019-12-30 DIAGNOSIS — R778 Other specified abnormalities of plasma proteins: Secondary | ICD-10-CM | POA: Insufficient documentation

## 2020-01-07 DIAGNOSIS — G629 Polyneuropathy, unspecified: Secondary | ICD-10-CM | POA: Insufficient documentation

## 2020-04-15 DIAGNOSIS — R200 Anesthesia of skin: Secondary | ICD-10-CM | POA: Insufficient documentation

## 2020-06-27 ENCOUNTER — Encounter: Payer: Self-pay | Admitting: Emergency Medicine

## 2020-06-27 ENCOUNTER — Ambulatory Visit: Payer: Self-pay

## 2020-06-27 ENCOUNTER — Ambulatory Visit
Admission: EM | Admit: 2020-06-27 | Discharge: 2020-06-27 | Disposition: A | Discharge status: Home / Self Care | Payer: Federal, State, Local not specified - PPO | Attending: Physician Assistant | Admitting: Physician Assistant

## 2020-06-27 ENCOUNTER — Other Ambulatory Visit: Payer: Self-pay

## 2020-06-27 DIAGNOSIS — K5090 Crohn's disease, unspecified, without complications: Secondary | ICD-10-CM | POA: Insufficient documentation

## 2020-06-27 DIAGNOSIS — E785 Hyperlipidemia, unspecified: Secondary | ICD-10-CM | POA: Insufficient documentation

## 2020-06-27 DIAGNOSIS — Z20822 Contact with and (suspected) exposure to covid-19: Secondary | ICD-10-CM | POA: Insufficient documentation

## 2020-06-27 DIAGNOSIS — Z794 Long term (current) use of insulin: Secondary | ICD-10-CM | POA: Diagnosis not present

## 2020-06-27 DIAGNOSIS — Z885 Allergy status to narcotic agent status: Secondary | ICD-10-CM | POA: Diagnosis not present

## 2020-06-27 DIAGNOSIS — I25118 Atherosclerotic heart disease of native coronary artery with other forms of angina pectoris: Secondary | ICD-10-CM | POA: Diagnosis not present

## 2020-06-27 DIAGNOSIS — Z881 Allergy status to other antibiotic agents status: Secondary | ICD-10-CM | POA: Insufficient documentation

## 2020-06-27 DIAGNOSIS — Z8616 Personal history of COVID-19: Secondary | ICD-10-CM | POA: Insufficient documentation

## 2020-06-27 DIAGNOSIS — Z882 Allergy status to sulfonamides status: Secondary | ICD-10-CM | POA: Diagnosis not present

## 2020-06-27 DIAGNOSIS — J029 Acute pharyngitis, unspecified: Secondary | ICD-10-CM

## 2020-06-27 DIAGNOSIS — J069 Acute upper respiratory infection, unspecified: Secondary | ICD-10-CM

## 2020-06-27 DIAGNOSIS — E119 Type 2 diabetes mellitus without complications: Secondary | ICD-10-CM | POA: Insufficient documentation

## 2020-06-27 DIAGNOSIS — Z7984 Long term (current) use of oral hypoglycemic drugs: Secondary | ICD-10-CM | POA: Insufficient documentation

## 2020-06-27 DIAGNOSIS — Z28310 Unvaccinated for covid-19: Secondary | ICD-10-CM | POA: Diagnosis not present

## 2020-06-27 DIAGNOSIS — R059 Cough, unspecified: Secondary | ICD-10-CM | POA: Diagnosis present

## 2020-06-27 DIAGNOSIS — Z79899 Other long term (current) drug therapy: Secondary | ICD-10-CM | POA: Diagnosis not present

## 2020-06-27 DIAGNOSIS — Z87891 Personal history of nicotine dependence: Secondary | ICD-10-CM | POA: Insufficient documentation

## 2020-06-27 DIAGNOSIS — Z79890 Hormone replacement therapy (postmenopausal): Secondary | ICD-10-CM | POA: Diagnosis not present

## 2020-06-27 LAB — GROUP A STREP BY PCR: Group A Strep by PCR: NOT DETECTED

## 2020-06-27 MED ORDER — IPRATROPIUM BROMIDE 0.06 % NA SOLN: 2.0000 | Freq: Four times a day (QID) | NASAL | 12 refills | Status: DC

## 2020-06-27 MED ORDER — PROMETHAZINE-DM 6.25-15 MG/5ML PO SYRP: 5.0000 mL | ORAL_SOLUTION | Freq: Every evening | ORAL | 0 refills | Status: AC

## 2020-06-27 MED ORDER — LIDOCAINE VISCOUS HCL 2 % MT SOLN: 15.0000 mL | OROMUCOSAL | 0 refills | Status: AC | PRN

## 2020-06-27 MED ORDER — BENZONATATE 200 MG PO CAPS: 200.0000 mg | ORAL_CAPSULE | Freq: Three times a day (TID) | ORAL | 0 refills | Status: AC | PRN

## 2020-06-27 NOTE — Discharge Instructions (Signed)
Negative strep test.  URI/COLD SYMPTOMS: Your exam today is consistent with a viral illness. Antibiotics are not indicated at this time. Use medications as directed, including tessalon perles for cough during the day and cough syrup (promethazine DM at night), nasal saline, and nasal decongestants (atrovent spray). Your symptoms should improve over the next few days and resolve within 7-10 days. Increase rest and fluids. F/u if symptoms worsen or predominate such as sore throat, ear pain, productive cough, shortness of breath, or if you develop high fevers or worsening fatigue over the next several days.    You have received COVID testing today either for positive exposure, concerning symptoms that could be related to COVID infection, screening purposes, or re-testing after confirmed positive.  Your test obtained today checks for active viral infection in the last 1-2 weeks. If your test is negative now, you can still test positive later. So, if you do develop symptoms you should either get re-tested and/or isolate x 5 days and then strict mask use x 5 days (unvaccinated) or mask use x 10 days (vaccinated). Please follow CDC guidelines.  While Rapid antigen tests come back in 15-20 minutes, send out PCR/molecular test results typically come back within 1-3 days. In the mean time, if you are symptomatic, assume this could be a positive test and treat/monitor yourself as if you do have COVID.   We will call with test results if positive. Please download the MyChart app and set up a profile to access test results.   If symptomatic, go home and rest. Push fluids. Take Tylenol as needed for discomfort. Gargle warm salt water. Throat lozenges. Take Mucinex DM or Robitussin for cough. Humidifier in bedroom to ease coughing. Warm showers. Also review the COVID handout for more information.  COVID-19 INFECTION: The incubation period of COVID-19 is approximately 14 days after exposure, with most symptoms  developing in roughly 4-5 days. Symptoms may range in severity from mild to critically severe. Roughly 80% of those infected will have mild symptoms. People of any age may become infected with COVID-19 and have the ability to transmit the virus. The most common symptoms include: fever, fatigue, cough, body aches, headaches, sore throat, nasal congestion, shortness of breath, nausea, vomiting, diarrhea, changes in smell and/or taste.    COURSE OF ILLNESS Some patients may begin with mild disease which can progress quickly into critical symptoms. If your symptoms are worsening please call ahead to the Emergency Department and proceed there for further treatment. Recovery time appears to be roughly 1-2 weeks for mild symptoms and 3-6 weeks for severe disease.   GO IMMEDIATELY TO ER FOR FEVER YOU ARE UNABLE TO GET DOWN WITH TYLENOL, BREATHING PROBLEMS, CHEST PAIN, FATIGUE, LETHARGY, INABILITY TO EAT OR DRINK, ETC  QUARANTINE AND ISOLATION: To help decrease the spread of COVID-19 please remain isolated if you have COVID infection or are highly suspected to have COVID infection. This means -stay home and isolate to one room in the home if you live with others. Do not share a bed or bathroom with others while ill, sanitize and wipe down all countertops and keep common areas clean and disinfected. Stay home for 5 days. If you have no symptoms or your symptoms are resolving after 5 days, you can leave your house. Continue to wear a mask around others for 5 additional days. If you have been in close contact (within 6 feet) of someone diagnosed with COVID 19, you are advised to quarantine in your home for 14 days  as symptoms can develop anywhere from 2-14 days after exposure to the virus. If you develop symptoms, you  must isolate.  Most current guidelines for COVID after exposure -unvaccinated: isolate 5 days and strict mask use x 5 days. Test on day 5 is possible -vaccinated: wear mask x 10 days if symptoms do not  develop -You do not necessarily need to be tested for COVID if you have + exposure and  develop symptoms. Just isolate at home x10 days from symptom onset During this global pandemic, CDC advises to practice social distancing, try to stay at least 14ft away from others at all times. Wear a face covering. Wash and sanitize your hands regularly and avoid going anywhere that is not necessary.  KEEP IN MIND THAT THE COVID TEST IS NOT 100% ACCURATE AND YOU SHOULD STILL DO EVERYTHING TO PREVENT POTENTIAL SPREAD OF VIRUS TO OTHERS (WEAR MASK, WEAR GLOVES, [place] HANDS AND SANITIZE REGULARLY). IF INITIAL TEST IS NEGATIVE, THIS MAY NOT MEAN YOU ARE DEFINITELY NEGATIVE. MOST ACCURATE TESTING IS DONE 5-7 DAYS AFTER EXPOSURE.   It is not advised by CDC to get re-tested after receiving a positive COVID test since you can still test positive for weeks to months after you have already cleared the virus.   *If you have not been vaccinated for COVID, I strongly suggest you consider getting vaccinated as long as there are no contraindications.

## 2020-06-27 NOTE — ED Triage Notes (Signed)
Pt is present today with a sore throat and a cough. Pt states that her sx started Wednesday. Pt is currently taking antibiotics for abdominal pain due to Crohns diease.

## 2020-06-27 NOTE — ED Provider Notes (Signed)
[facility]    CSN: [id number] Arrival date & time: 06/27/20  1110      History   Chief Complaint Chief Complaint  Patient presents with  . Cough  . Sore Throat    HPI Joanna Ortiz is a 75 y.o. female presenting for approximately 5-day history of sore throat, nasal congestion, cough and headaches.  Also admits to fatigue and occasional shortness of breath.  No chest pain.  Denies any body aches.  Patient denies any fevers, sweats, chills, chest pain, abdominal pain, nausea/vomiting or diarrhea.  She says that her grandchildren have been sick with similar symptoms a couple weeks ago but she denies any known COVID exposure.  She has not been vaccinated for COVID-19 and does not plan to be.  She says that she has a history of COVID-19 in 2020.  She believes she also had in 2021 but was never tested.  Patient has currently been drinking whiskey and lemon juice for her sore throat and taking over-the-counter antihistamines without relief.  She denies any worsening or improvement in her symptoms since onset.  She does have past medical history of type 2 diabetes, Crohn's disease on immunosuppressive therapy, collagen vascular disease, CAD, hyperlipidemia, and hypothyroidism.  No history of COPD or asthma.  She says she does have an inhaler though and has used that when she is felt short of breath and it is helped.  She has no other concerns today.  HPI  Past Medical History:  Diagnosis Date  . Anemia   . Arthritis   . Collagen vascular disease ([place])   . Coronary artery disease   . Crohn disease ([place])   . Diabetes mellitus without complication (HCC)    diet controlled  . Hyperlipidemia   . Hypothyroidism   . Pneumonia    3/18  . Thyroid disease    goiter    Patient Active Problem List   Diagnosis Date Noted  . Stable angina ([place]) 04/03/2018  . Right groin pain 06/22/2016  . Acute bronchitis 05/01/2016  . Diastasis recti 10/29/2015  . Rectal bleeding 06/10/2014  .  AP (abdominal pain) 12/29/2013  . Crohn's disease ([place]) 12/18/2013    Past Surgical History:  Procedure Laterality Date  . ABDOMINAL HYSTERECTOMY  1987  . APPENDECTOMY    . BREAST BIOPSY Bilateral    neg  . CATARACT EXTRACTION W/PHACO Right 11/06/2016   Procedure: CATARACT EXTRACTION PHACO AND INTRAOCULAR LENS PLACEMENT (IOC);  Surgeon: Birder Robson, MD;  Location: [facility];  Service: Ophthalmology;  Laterality: Right;  [place] 00:42.6 AP% 16.7 CDE 7.14 Fluid Pack Lot # [id number]  . CATARACT EXTRACTION W/PHACO Left 12/25/2016   Procedure: CATARACT EXTRACTION PHACO AND INTRAOCULAR LENS PLACEMENT (IOC);  Surgeon: Birder Robson, MD;  Location: [facility];  Service: Ophthalmology;  Laterality: Left;  [place] 00:32 AP% 13.7 CDE 4.40 Fluid pack lot # [id number]  . COLON SURGERY  06/06/2011   Resection of terminal ileum and previous ileocolonic anastomosis for recurrent Crohn's disease. 3 focal strictures identified.  . COLONOSCOPY  2013  . JOINT REPLACEMENT Left    SHOULDER  . LEFT HEART CATH AND CORONARY ANGIOGRAPHY Left 04/04/2018   Procedure: LEFT HEART CATH AND CORONARY ANGIOGRAPHY;  Surgeon: Corey Skains, MD;  Location: [facility];  Service: Cardiovascular;  Laterality: Left;  . SHOULDER SURGERY Right 11/20/2018   Procedure: R SHOULDER ARTHROSCOPY: biceps tenomyotomy, distal claviculectomy, acromioplasty, posterior capsule release, debridement, decrompression of subacromial space, coracoacromial release, bursectomy  . SMALL INTESTINE  SURGERY  2012  . THYROID SURGERY  1992  . TONSILLECTOMY      OB History    Gravida  6   Para  4   Term      Preterm      AB  2   Living  4     SAB  2   IAB      Ectopic      Multiple      Live Births           Obstetric Comments  1st Menstrual Cycle:  14  1st Pregnancy: 19         Home Medications    Prior to Admission medications   Medication Sig Start Date End Date Taking? Authorizing Provider  benzonatate  (TESSALON) 200 MG capsule Take 1 capsule (200 mg total) by mouth 3 (three) times daily as needed for up to 10 days for cough. 06/27/20 07/07/20 Yes Laurene Footman B, PA-C  ipratropium (ATROVENT) 0.06 % nasal spray Place 2 sprays into both nostrils 4 (four) times daily. 06/27/20  Yes Laurene Footman B, PA-C  lidocaine (XYLOCAINE) 2 % solution Use as directed 15 mLs in the mouth or throat every 3 (three) hours as needed for up to 5 days for mouth pain (swish and spit). 06/27/20 07/02/20 Yes Danton Clap, PA-C  promethazine-dextromethorphan (PROMETHAZINE-DM) 6.25-15 MG/5ML syrup Take 5 mLs by mouth at bedtime for 10 days. 06/27/20 07/07/20 Yes Danton Clap, PA-C  acetaminophen (TYLENOL) 500 MG tablet Take 1,000 mg by mouth every 8 (eight) hours as needed for mild pain or moderate pain.    [provider]  Adalimumab 40 MG/0.4ML PSKT Inject 40 mg into the skin every 14 (fourteen) days.  10/03/17   [provider]  albuterol (VENTOLIN HFA) 108 (90 Base) MCG/ACT inhaler Inhale 1-2 puffs into the lungs every 4 (four) hours as needed for wheezing or shortness of breath. 10/21/19   Melynda Ripple, MD  amoxicillin-clavulanate (AUGMENTIN) 875-125 MG tablet Take 1 tablet by mouth 2 (two) times daily. X 7 days 10/20/19   Melynda Ripple, MD  Ascorbic Acid (VITAMIN C) 1000 MG tablet Take 1,000 mg by mouth 3 (three) times a week.     [provider]  blood glucose meter kit and supplies KIT Dispense based on patient and insurance preference. Use up to four times daily as directed. 02/26/18   Coral Spikes, DO  cholestyramine Lucrezia Starch) 4 G packet Take 4 g by mouth 2 (two) times daily as needed (stomach problems).     [provider]  CINNAMON PO Take 1,200 mg by mouth daily.    [provider]  cyanocobalamin (,VITAMIN B-12,) 1000 MCG/ML injection Inject 1,000 mcg into the muscle every 30 (thirty) days.    [provider]  Insulin Glargine (LANTUS) 100 UNIT/ML Solostar Pen  Inject 10 Units into the skin daily. 02/26/18   Coral Spikes, DO  Insulin Pen Needle 32G X 4 MM MISC Use as directed to administer insulin. 02/26/18   Coral Spikes, DO  magnesium oxide (MAG-OX) 400 MG tablet Take 400 mg by mouth daily.    [provider]  metFORMIN (GLUCOPHAGE) 500 MG tablet Take 1 tablet (500 mg total) by mouth 2 (two) times daily with a meal. 02/26/18 10/20/19  Thersa Salt G, DO  ondansetron (ZOFRAN-ODT) 4 MG disintegrating tablet Take 4 mg by mouth every 8 (eight) hours as needed for nausea or vomiting.  11/16/15   [provider]  Spacer/Aero-Holding Chambers (AEROCHAMBER PLUS) inhaler Use as instructed 10/20/19   Melynda Ripple, MD  SYNTHROID 100 MCG tablet Take 100 mcg by mouth daily before breakfast.  04/27/14   [provider]  THERATEARS 0.25 % SOLN Place 1 drop into both eyes 3 (three) times daily as needed (dry/irritated eyes.).    [provider]  thyroid (ARMOUR) 30 MG tablet Take 30 mg by mouth daily before breakfast.     [provider]  Turmeric 500 MG CAPS Take 500 mg by mouth daily.    [provider]  XIFAXAN 550 MG TABS tablet Take 550 mg by mouth 3 (three) times daily. 06/22/20   [provider]    Family History Family History  Problem Relation Age of Onset  . Breast cancer Sister 83  . Crohn's disease Mother   . Heart attack Father 39    Social History Social History   Tobacco Use  . Smoking status: Former Smoker    Packs/day: 1.00    Years: 12.00    Pack years: 12.00    Types: Cigarettes    Quit date: 10/19/1969    Years since quitting: 50.7  . Smokeless tobacco: Never Used  Vaping Use  . Vaping Use: Never used  Substance Use Topics  . Alcohol use: No  . Drug use: No     Allergies   Erythromycin, Hydromorphone, Cefuroxime, Ciprocinonide [fluocinolone], Codeine, Floxin [ofloxacin], Keflex [cephalexin], Levofloxacin, Sulfa antibiotics, and Ciprofloxacin   Review of Systems Review  of Systems  Constitutional: Positive for fatigue. Negative for chills, diaphoresis and fever.  HENT: Positive for congestion, rhinorrhea and sore throat. Negative for ear pain, sinus pressure and sinus pain.   Respiratory: Positive for cough and shortness of breath. Negative for wheezing.   Cardiovascular: Negative for chest pain.  Gastrointestinal: Negative for abdominal pain, nausea and vomiting.  Musculoskeletal: Negative for arthralgias and myalgias.  Skin: Negative for rash.  Neurological: Negative for weakness and headaches.  Hematological: Negative for adenopathy.     Physical Exam Triage Vital Signs ED Triage Vitals  Enc Vitals Group     BP 06/27/20 1129 128/88     Pulse Rate 06/27/20 1129 92     Resp 06/27/20 1129 18     Temp 06/27/20 1129 98 F (36.7 C)     Temp Source 06/27/20 1129 Oral     SpO2 06/27/20 1129 100 %     Weight --      Height --      Head Circumference --      Peak Flow --      Pain Score 06/27/20 1127 3     Pain Loc --      Pain Edu? --      Excl. in [place]? --    No data found.  Updated Vital Signs BP 128/88 (BP Location: Left Arm)   Pulse 92   Temp 98 F (36.7 C) (Oral)   Resp 18   SpO2 100%       Physical Exam Vitals and nursing note reviewed.  Constitutional:      General: She is not in acute distress.    Appearance: Normal appearance. She is not ill-appearing or toxic-appearing.  HENT:     Head: Normocephalic and atraumatic.     Right Ear: Tympanic membrane, ear canal and external ear normal.     Left Ear: Tympanic membrane, ear canal and external ear normal.     Nose: Congestion and rhinorrhea present.  Mouth/Throat:     Mouth: Mucous membranes are moist.     Pharynx: Oropharynx is clear. Posterior oropharyngeal erythema (mild) present.  Eyes:     General: No scleral icterus.       Right eye: No discharge.        Left eye: No discharge.     Conjunctiva/sclera: Conjunctivae normal.  Cardiovascular:     Rate and Rhythm:  Normal rate and regular rhythm.     Heart sounds: Normal heart sounds.  Pulmonary:     Effort: Pulmonary effort is normal. No respiratory distress.     Breath sounds: Normal breath sounds. No wheezing, rhonchi or rales.  Musculoskeletal:     Cervical back: Neck supple.  Skin:    General: Skin is dry.  Neurological:     General: No focal deficit present.     Mental Status: She is alert. Mental status is at baseline.     Motor: No weakness.     Gait: Gait normal.  Psychiatric:        Mood and Affect: Mood normal.        Behavior: Behavior normal.        Thought Content: Thought content normal.      UC Treatments / Results  Labs (all labs ordered are listed, but only abnormal results are displayed) Labs Reviewed  GROUP A STREP BY PCR  SARS CORONAVIRUS 2 (TAT 6-24 HRS)    EKG   Radiology No results found.  Procedures Procedures (including critical care time)  Medications Ordered in UC Medications - No data to display  Initial Impression / Assessment and Plan / UC Course  I have reviewed the triage vital signs and the nursing notes.  Pertinent labs & imaging results that were available during my care of the patient were reviewed by me and considered in my medical decision making (see chart for details).   75 year old female presenting for 5-day history of cough, sore throat and congestion.  All vital signs are normal and stable.  Exam significant for mild nasal congestion and rhinorrhea as well as mild posterior pharyngeal erythema.  Her chest is clear to auscultation and heart regular rate and rhythm.  Molecular strep test obtained.  COVID test obtained.  Current CDC guidelines, isolation protocol and ED precautions reviewed with patient.  Advised patient she likely has a viral illness and supportive care encouraged at this time with increasing rest and fluids.  I have sent benzonatate for her to take during the day and Promethazine DM for at night.  Also sent  Atrovent nasal spray and viscous lidocaine.  Advised to follow-up as needed for any worsening symptoms or if not better after 7 to 10 days.   Final Clinical Impressions(s) / UC Diagnoses   Final diagnoses:  Viral upper respiratory tract infection  Cough  Sore throat     Discharge Instructions     Negative strep test.  URI/COLD SYMPTOMS: Your exam today is consistent with a viral illness. Antibiotics are not indicated at this time. Use medications as directed, including tessalon perles for cough during the day and cough syrup (promethazine DM at night), nasal saline, and nasal decongestants (atrovent spray). Your symptoms should improve over the next few days and resolve within 7-10 days. Increase rest and fluids. F/u if symptoms worsen or predominate such as sore throat, ear pain, productive cough, shortness of breath, or if you develop high fevers or worsening fatigue over the next several days.    You have received  COVID testing today either for positive exposure, concerning symptoms that could be related to COVID infection, screening purposes, or re-testing after confirmed positive.  Your test obtained today checks for active viral infection in the last 1-2 weeks. If your test is negative now, you can still test positive later. So, if you do develop symptoms you should either get re-tested and/or isolate x 5 days and then strict mask use x 5 days (unvaccinated) or mask use x 10 days (vaccinated). Please follow CDC guidelines.  While Rapid antigen tests come back in 15-20 minutes, send out PCR/molecular test results typically come back within 1-3 days. In the mean time, if you are symptomatic, assume this could be a positive test and treat/monitor yourself as if you do have COVID.   We will call with test results if positive. Please download the MyChart app and set up a profile to access test results.   If symptomatic, go home and rest. Push fluids. Take Tylenol as needed for discomfort.  Gargle warm salt water. Throat lozenges. Take Mucinex DM or Robitussin for cough. Humidifier in bedroom to ease coughing. Warm showers. Also review the COVID handout for more information.  COVID-19 INFECTION: The incubation period of COVID-19 is approximately 14 days after exposure, with most symptoms developing in roughly 4-5 days. Symptoms may range in severity from mild to critically severe. Roughly 80% of those infected will have mild symptoms. People of any age may become infected with COVID-19 and have the ability to transmit the virus. The most common symptoms include: fever, fatigue, cough, body aches, headaches, sore throat, nasal congestion, shortness of breath, nausea, vomiting, diarrhea, changes in smell and/or taste.    COURSE OF ILLNESS Some patients may begin with mild disease which can progress quickly into critical symptoms. If your symptoms are worsening please call ahead to the Emergency Department and proceed there for further treatment. Recovery time appears to be roughly 1-2 weeks for mild symptoms and 3-6 weeks for severe disease.   GO IMMEDIATELY TO ER FOR FEVER YOU ARE UNABLE TO GET DOWN WITH TYLENOL, BREATHING PROBLEMS, CHEST PAIN, FATIGUE, LETHARGY, INABILITY TO EAT OR DRINK, ETC  QUARANTINE AND ISOLATION: To help decrease the spread of COVID-19 please remain isolated if you have COVID infection or are highly suspected to have COVID infection. This means -stay home and isolate to one room in the home if you live with others. Do not share a bed or bathroom with others while ill, sanitize and wipe down all countertops and keep common areas clean and disinfected. Stay home for 5 days. If you have no symptoms or your symptoms are resolving after 5 days, you can leave your house. Continue to wear a mask around others for 5 additional days. If you have been in close contact (within 6 feet) of someone diagnosed with COVID 19, you are advised to quarantine in your home for 14 days as  symptoms can develop anywhere from 2-14 days after exposure to the virus. If you develop symptoms, you  must isolate.  Most current guidelines for COVID after exposure -unvaccinated: isolate 5 days and strict mask use x 5 days. Test on day 5 is possible -vaccinated: wear mask x 10 days if symptoms do not develop -You do not necessarily need to be tested for COVID if you have + exposure and  develop symptoms. Just isolate at home x10 days from symptom onset During this global pandemic, CDC advises to practice social distancing, try to stay at least 60f away  from others at all times. Wear a face covering. Wash and sanitize your hands regularly and avoid going anywhere that is not necessary.  KEEP IN MIND THAT THE COVID TEST IS NOT 100% ACCURATE AND YOU SHOULD STILL DO EVERYTHING TO PREVENT POTENTIAL SPREAD OF VIRUS TO OTHERS (WEAR MASK, WEAR GLOVES, [place] HANDS AND SANITIZE REGULARLY). IF INITIAL TEST IS NEGATIVE, THIS MAY NOT MEAN YOU ARE DEFINITELY NEGATIVE. MOST ACCURATE TESTING IS DONE 5-7 DAYS AFTER EXPOSURE.   It is not advised by CDC to get re-tested after receiving a positive COVID test since you can still test positive for weeks to months after you have already cleared the virus.   *If you have not been vaccinated for COVID, I strongly suggest you consider getting vaccinated as long as there are no contraindications.      ED Prescriptions    Medication Sig Dispense Auth. Provider   benzonatate (TESSALON) 200 MG capsule Take 1 capsule (200 mg total) by mouth 3 (three) times daily as needed for up to 10 days for cough. 20 capsule Danton Clap, PA-C   promethazine-dextromethorphan (PROMETHAZINE-DM) 6.25-15 MG/5ML syrup Take 5 mLs by mouth at bedtime for 10 days. 50 mL Laurene Footman B, PA-C   lidocaine (XYLOCAINE) 2 % solution Use as directed 15 mLs in the mouth or throat every 3 (three) hours as needed for up to 5 days for mouth pain (swish and spit). 100 mL Laurene Footman B, PA-C    ipratropium (ATROVENT) 0.06 % nasal spray Place 2 sprays into both nostrils 4 (four) times daily. 15 mL Danton Clap, PA-C     PDMP not reviewed this encounter.   Danton Clap, PA-C 06/27/20 1223

## 2020-06-28 LAB — SARS CORONAVIRUS 2 (TAT 6-24 HRS): SARS Coronavirus 2: NEGATIVE

## 2020-07-14 ENCOUNTER — Ambulatory Visit
Admission: RE | Admit: 2020-07-14 | Discharge: 2020-07-14 | Disposition: A | Discharge status: Home / Self Care | Payer: Federal, State, Local not specified - PPO | Source: Ambulatory Visit | Attending: Family Medicine | Admitting: Family Medicine

## 2020-07-14 ENCOUNTER — Other Ambulatory Visit: Payer: Self-pay

## 2020-07-14 VITALS — BP 111/68 | HR 93 | Temp 97.6°F | Resp 18

## 2020-07-14 DIAGNOSIS — N3000 Acute cystitis without hematuria: Secondary | ICD-10-CM

## 2020-07-14 LAB — URINALYSIS, COMPLETE (UACMP) WITH MICROSCOPIC
Bilirubin Urine: NEGATIVE
Glucose, UA: NEGATIVE mg/dL
Ketones, ur: NEGATIVE mg/dL
Nitrite: NEGATIVE
Protein, ur: NEGATIVE mg/dL
Specific Gravity, Urine: 1.015 (ref 1.005–1.030)
WBC, UA: >50 WBC/hpf (ref 0–5)
pH: 5 (ref 5.0–8.0)

## 2020-07-14 MED ORDER — AMPICILLIN 500 MG PO CAPS: 500.0000 mg | ORAL_CAPSULE | Freq: Four times a day (QID) | ORAL | 0 refills | Status: AC

## 2020-07-14 NOTE — ED Provider Notes (Signed)
[facility]    CSN: [id number] Arrival date & time: 07/14/20  1543      History   Chief Complaint Chief Complaint  Patient presents with  . Dysuria   HPI   75 year old female presents with urinary symptoms.  2-day history of symptoms.  She reports mild incontinence, urgency and frequency with mild dysuria.  She reports some back pain as well.  No significant abdominal pain.  No fever.  No relieving factors.  She is concerned that she has UTI.  She has multiple allergies.  Last urine culture grew E. coli which was resistant to [place].  No other reported symptoms.  No other complaints.  Past Medical History:  Diagnosis Date  . Anemia   . Arthritis   . Collagen vascular disease ([place])   . Coronary artery disease   . Crohn disease ([place])   . Diabetes mellitus without complication (HCC)    diet controlled  . Hyperlipidemia   . Hypothyroidism   . Pneumonia    3/18  . Thyroid disease    goiter    Patient Active Problem List   Diagnosis Date Noted  . Stable angina ([place]) 04/03/2018  . Right groin pain 06/22/2016  . Acute bronchitis 05/01/2016  . Diastasis recti 10/29/2015  . Rectal bleeding 06/10/2014  . AP (abdominal pain) 12/29/2013  . Crohn's disease ([place]) 12/18/2013    Past Surgical History:  Procedure Laterality Date  . ABDOMINAL HYSTERECTOMY  1987  . APPENDECTOMY    . BREAST BIOPSY Bilateral    neg  . CATARACT EXTRACTION W/PHACO Right 11/06/2016   Procedure: CATARACT EXTRACTION PHACO AND INTRAOCULAR LENS PLACEMENT (IOC);  Surgeon: Birder Robson, MD;  Location: [facility];  Service: Ophthalmology;  Laterality: Right;  [place] 00:42.6 AP% 16.7 CDE 7.14 Fluid Pack Lot # [id number]  . CATARACT EXTRACTION W/PHACO Left 12/25/2016   Procedure: CATARACT EXTRACTION PHACO AND INTRAOCULAR LENS PLACEMENT (IOC);  Surgeon: Birder Robson, MD;  Location: [facility];  Service: Ophthalmology;  Laterality: Left;  [place] 00:32 AP% 13.7 CDE 4.40 Fluid pack lot # [id number]   . COLON SURGERY  06/06/2011   Resection of terminal ileum and previous ileocolonic anastomosis for recurrent Crohn's disease. 3 focal strictures identified.  . COLONOSCOPY  2013  . JOINT REPLACEMENT Left    SHOULDER  . LEFT HEART CATH AND CORONARY ANGIOGRAPHY Left 04/04/2018   Procedure: LEFT HEART CATH AND CORONARY ANGIOGRAPHY;  Surgeon: Corey Skains, MD;  Location: [facility];  Service: Cardiovascular;  Laterality: Left;  . SHOULDER SURGERY Right 11/20/2018   Procedure: R SHOULDER ARTHROSCOPY: biceps tenomyotomy, distal claviculectomy, acromioplasty, posterior capsule release, debridement, decrompression of subacromial space, coracoacromial release, bursectomy  . SMALL INTESTINE SURGERY  2012  . THYROID SURGERY  1992  . TONSILLECTOMY      OB History    Gravida  6   Para  4   Term      Preterm      AB  2   Living  4     SAB  2   IAB      Ectopic      Multiple      Live Births           Obstetric Comments  1st Menstrual Cycle:  14  1st Pregnancy: 19         Home Medications    Prior to Admission medications   Medication Sig Start Date End Date Taking? Authorizing Provider  ampicillin (PRINCIPEN) 500  MG capsule Take 1 capsule (500 mg total) by mouth 4 (four) times daily for 7 days. 07/14/20 07/21/20 Yes Minette Manders G, DO  acetaminophen (TYLENOL) 500 MG tablet Take 1,000 mg by mouth every 8 (eight) hours as needed for mild pain or moderate pain.    [provider]  Adalimumab 40 MG/0.4ML PSKT Inject 40 mg into the skin every 14 (fourteen) days.  10/03/17   [provider]  albuterol (VENTOLIN HFA) 108 (90 Base) MCG/ACT inhaler Inhale 1-2 puffs into the lungs every 4 (four) hours as needed for wheezing or shortness of breath. 10/21/19   Melynda Ripple, MD  Ascorbic Acid (VITAMIN C) 1000 MG tablet Take 1,000 mg by mouth 3 (three) times a week.     [provider]  blood glucose meter kit and supplies KIT Dispense based on  patient and insurance preference. Use up to four times daily as directed. 02/26/18   Coral Spikes, DO  cholestyramine Lucrezia Starch) 4 G packet Take 4 g by mouth 2 (two) times daily as needed (stomach problems).     [provider]  CINNAMON PO Take 1,200 mg by mouth daily.    [provider]  cyanocobalamin (,VITAMIN B-12,) 1000 MCG/ML injection Inject 1,000 mcg into the muscle every 30 (thirty) days.    [provider]  Insulin Glargine (LANTUS) 100 UNIT/ML Solostar Pen Inject 10 Units into the skin daily. 02/26/18   Coral Spikes, DO  Insulin Pen Needle 32G X 4 MM MISC Use as directed to administer insulin. 02/26/18   Thersa Salt G, DO  ipratropium (ATROVENT) 0.06 % nasal spray Place 2 sprays into both nostrils 4 (four) times daily. 06/27/20   Laurene Footman B, PA-C  magnesium oxide (MAG-OX) 400 MG tablet Take 400 mg by mouth daily.    [provider]  metFORMIN (GLUCOPHAGE) 500 MG tablet Take 1 tablet (500 mg total) by mouth 2 (two) times daily with a meal. 02/26/18 10/20/19  Coral Spikes, DO  ondansetron (ZOFRAN-ODT) 4 MG disintegrating tablet Take 4 mg by mouth every 8 (eight) hours as needed for nausea or vomiting.  11/16/15   [provider]  Spacer/Aero-Holding Chambers (AEROCHAMBER PLUS) inhaler Use as instructed 10/20/19   Melynda Ripple, MD  SYNTHROID 100 MCG tablet Take 100 mcg by mouth daily before breakfast.  04/27/14   [provider]  THERATEARS 0.25 % SOLN Place 1 drop into both eyes 3 (three) times daily as needed (dry/irritated eyes.).    [provider]  thyroid (ARMOUR) 30 MG tablet Take 30 mg by mouth daily before breakfast.     [provider]  Turmeric 500 MG CAPS Take 500 mg by mouth daily.    [provider]  XIFAXAN 550 MG TABS tablet Take 550 mg by mouth 3 (three) times daily. 06/22/20   [provider]    Family History Family History  Problem Relation Age of Onset  . Breast cancer Sister 73   . Crohn's disease Mother   . Heart attack Father 41    Social History Social History   Tobacco Use  . Smoking status: Former Smoker    Packs/day: 1.00    Years: 12.00    Pack years: 12.00    Types: Cigarettes    Quit date: 10/19/1969    Years since quitting: 50.7  . Smokeless tobacco: Never Used  Vaping Use  . Vaping Use: Never used  Substance Use Topics  . Alcohol use: No  .  Drug use: No     Allergies   Erythromycin, Hydromorphone, Cefuroxime, Ciprocinonide [fluocinolone], Codeine, Floxin [ofloxacin], Keflex [cephalexin], Levofloxacin, Sulfa antibiotics, and Ciprofloxacin   Review of Systems Review of Systems  Constitutional: Negative for fever.  Genitourinary: Positive for dysuria, frequency and urgency.   Physical Exam Triage Vital Signs ED Triage Vitals  Enc Vitals Group     BP 07/14/20 1559 111/68     Pulse Rate 07/14/20 1559 93     Resp 07/14/20 1559 18     Temp 07/14/20 1559 97.6 F (36.4 C)     Temp Source 07/14/20 1559 Oral     SpO2 07/14/20 1559 100 %     Weight --      Height --      Head Circumference --      Peak Flow --      Pain Score 07/14/20 1557 0     Pain Loc --      Pain Edu? --      Excl. in [place]? --    Updated Vital Signs BP 111/68 (BP Location: Left Arm)   Pulse 93   Temp 97.6 F (36.4 C) (Oral)   Resp 18   SpO2 100%   Visual Acuity Right Eye Distance:   Left Eye Distance:   Bilateral Distance:    Right Eye Near:   Left Eye Near:    Bilateral Near:     Physical Exam Vitals and nursing note reviewed.  Constitutional:      General: She is not in acute distress.    Appearance: Normal appearance. She is not ill-appearing.  HENT:     Head: Normocephalic and atraumatic.  Eyes:     General:        Right eye: No discharge.        Left eye: No discharge.     Conjunctiva/sclera: Conjunctivae normal.  Cardiovascular:     Rate and Rhythm: Normal rate and regular rhythm.  Pulmonary:     Effort: Pulmonary effort is normal.  No respiratory distress.  Abdominal:     General: There is no distension.     Palpations: Abdomen is soft.     Tenderness: There is no abdominal tenderness.  Neurological:     Mental Status: She is alert.  Psychiatric:        Mood and Affect: Mood normal.        Behavior: Behavior normal.    UC Treatments / Results  Labs (all labs ordered are listed, but only abnormal results are displayed) Labs Reviewed  URINALYSIS, COMPLETE (UACMP) WITH MICROSCOPIC - Abnormal; Notable for the following components:      Result Value   APPearance HAZY (*)    Hgb urine dipstick TRACE (*)    Leukocytes,Ua SMALL (*)    Bacteria, UA MANY (*)    All other components within normal limits  URINE CULTURE    EKG   Radiology No results found.  Procedures Procedures (including critical care time)  Medications Ordered in UC Medications - No data to display  Initial Impression / Assessment and Plan / UC Course  I have reviewed the triage vital signs and the nursing notes.  Pertinent labs & imaging results that were available during my care of the patient were reviewed by me and considered in my medical decision making (see chart for details).    75 year old female presents with UTI.  Given prior culture results with resistant to [place] and her multiple allergies, I am placing her  on ampicillin which has worked in the recent past.  Sending culture.  Final Clinical Impressions(s) / UC Diagnoses   Final diagnoses:  Acute cystitis without hematuria   Discharge Instructions   None    ED Prescriptions    Medication Sig Dispense Auth. Provider   ampicillin (PRINCIPEN) 500 MG capsule Take 1 capsule (500 mg total) by mouth 4 (four) times daily for 7 days. 28 capsule Thersa Salt G, DO     PDMP not reviewed this encounter.   Coral Spikes, [place] 07/14/20 1647

## 2020-07-14 NOTE — ED Triage Notes (Signed)
Pt presents with dysuria, mild incontinence, fatigue, mild backache x 2 days.  Hx of sepsis r/t UTI.

## 2020-07-17 LAB — URINE CULTURE: Culture: >=100000 — AB

## 2020-08-10 ENCOUNTER — Other Ambulatory Visit
Admission: RE | Admit: 2020-08-10 | Discharge: 2020-08-10 | Disposition: A | Discharge status: Home / Self Care | Payer: Federal, State, Local not specified - PPO | Attending: Otolaryngology | Admitting: Otolaryngology

## 2020-08-10 ENCOUNTER — Other Ambulatory Visit: Payer: Self-pay

## 2020-08-10 DIAGNOSIS — E890 Postprocedural hypothyroidism: Secondary | ICD-10-CM | POA: Diagnosis present

## 2020-08-10 LAB — TSH: TSH: 4.241 u[IU]/mL (ref 0.350–4.500)

## 2020-08-10 LAB — T4, FREE: Free T4: 1.12 ng/dL (ref 0.61–1.12)

## 2020-08-11 LAB — T3, FREE: T3, Free: 2.4 pg/mL (ref 2.0–4.4)

## 2020-08-24 ENCOUNTER — Ambulatory Visit: Payer: Self-pay

## 2020-08-24 ENCOUNTER — Other Ambulatory Visit: Payer: Self-pay

## 2020-08-24 ENCOUNTER — Ambulatory Visit
Admission: EM | Admit: 2020-08-24 | Discharge: 2020-08-24 | Disposition: A | Discharge status: Home / Self Care | Payer: Federal, State, Local not specified - PPO | Attending: Emergency Medicine | Admitting: Emergency Medicine

## 2020-08-24 DIAGNOSIS — N390 Urinary tract infection, site not specified: Secondary | ICD-10-CM | POA: Diagnosis not present

## 2020-08-24 LAB — POCT URINALYSIS DIP (DEVICE)
Bilirubin Urine: NEGATIVE
Glucose, UA: NEGATIVE mg/dL
Ketones, ur: NEGATIVE mg/dL
Nitrite: NEGATIVE
Protein, ur: NEGATIVE mg/dL
Specific Gravity, Urine: 1.02 (ref 1.005–1.030)
Urobilinogen, UA: 0.2 mg/dL (ref 0.0–1.0)
pH: 5.5 (ref 5.0–8.0)

## 2020-08-24 MED ORDER — PHENAZOPYRIDINE HCL 200 MG PO TABS: 200.0000 mg | ORAL_TABLET | Freq: Three times a day (TID) | ORAL | 0 refills | Status: DC

## 2020-08-24 MED ORDER — NITROFURANTOIN MONOHYD MACRO 100 MG PO CAPS: 100.0000 mg | ORAL_CAPSULE | Freq: Two times a day (BID) | ORAL | 0 refills | Status: DC

## 2020-08-24 NOTE — Discharge Instructions (Addendum)

## 2020-08-24 NOTE — ED Provider Notes (Signed)
[facility]    CSN: [id number] Arrival date & time: 08/24/20  9518      History   Chief Complaint Chief Complaint  Patient presents with   Dysuria    HPI Joanna Ortiz is a 75 y.o. female.   HPI  75 year old female here for evaluation of urinary complaints.  Patient reports that she has had increased urinary frequency and urgency with painful urination for last 2 to 3 days.  Today the symptoms became worse in that she had an episode of incontinence.  She has not had a fever, seen any blood in her urine, had any abdominal pain, or had any cloudy urine.  Past Medical History:  Diagnosis Date   Anemia    Arthritis    Collagen vascular disease ([place])    Coronary artery disease    Crohn disease ([place])    Diabetes mellitus without complication ([place])    diet controlled   Hyperlipidemia    Hypothyroidism    Pneumonia    3/18   Thyroid disease    goiter    Patient Active Problem List   Diagnosis Date Noted   Stable angina ([place]) 04/03/2018   Right groin pain 06/22/2016   Acute bronchitis 05/01/2016   Diastasis recti 10/29/2015   Rectal bleeding 06/10/2014   AP (abdominal pain) 12/29/2013   Crohn's disease ([place]) 12/18/2013    Past Surgical History:  Procedure Laterality Date   ABDOMINAL HYSTERECTOMY  1987   APPENDECTOMY     BREAST BIOPSY Bilateral    neg   CATARACT EXTRACTION W/PHACO Right 11/06/2016   Procedure: CATARACT EXTRACTION PHACO AND INTRAOCULAR LENS PLACEMENT ([place]);  Surgeon: Birder Robson, MD;  Location: [facility];  Service: Ophthalmology;  Laterality: Right;  [place] 00:42.6 AP% 16.7 CDE 7.14 Fluid Pack Lot # [id number]   CATARACT EXTRACTION W/PHACO Left 12/25/2016   Procedure: CATARACT EXTRACTION PHACO AND INTRAOCULAR LENS PLACEMENT (IOC);  Surgeon: Birder Robson, MD;  Location: [facility];  Service: Ophthalmology;  Laterality: Left;  [place] 00:32 AP% 13.7 CDE 4.40 Fluid pack lot # [id number]   COLON SURGERY  06/06/2011   Resection of terminal  ileum and previous ileocolonic anastomosis for recurrent Crohn's disease. 3 focal strictures identified.   COLONOSCOPY  2013   JOINT REPLACEMENT Left    SHOULDER   LEFT HEART CATH AND CORONARY ANGIOGRAPHY Left 04/04/2018   Procedure: LEFT HEART CATH AND CORONARY ANGIOGRAPHY;  Surgeon: Corey Skains, MD;  Location: [facility];  Service: Cardiovascular;  Laterality: Left;   SHOULDER SURGERY Right 11/20/2018   Procedure: R SHOULDER ARTHROSCOPY: biceps tenomyotomy, distal claviculectomy, acromioplasty, posterior capsule release, debridement, decrompression of subacromial space, coracoacromial release, bursectomy   SMALL INTESTINE SURGERY  2012   THYROID SURGERY  1992   TONSILLECTOMY      OB History     Gravida  6   Para  4   Term      Preterm      AB  2   Living  4      SAB  2   IAB      Ectopic      Multiple      Live Births           Obstetric Comments  1st Menstrual Cycle:  14  1st Pregnancy: 19          Home Medications    Prior to Admission medications   Medication Sig Start Date End Date Taking? Authorizing Provider  acetaminophen (TYLENOL)  500 MG tablet Take 1,000 mg by mouth every 8 (eight) hours as needed for mild pain or moderate pain.   Yes [provider]  Adalimumab 40 MG/0.4ML PSKT Inject 40 mg into the skin every 14 (fourteen) days.  10/03/17  Yes [provider]  albuterol (VENTOLIN HFA) 108 (90 Base) MCG/ACT inhaler Inhale 1-2 puffs into the lungs every 4 (four) hours as needed for wheezing or shortness of breath. 10/21/19  Yes Melynda Ripple, MD  Ascorbic Acid (VITAMIN C) 1000 MG tablet Take 1,000 mg by mouth 3 (three) times a week.    Yes [provider]  blood glucose meter kit and supplies KIT Dispense based on patient and insurance preference. Use up to four times daily as directed. 02/26/18  Yes Cook, Jayce G, DO  cholestyramine Lucrezia Starch) 4 G packet Take 4 g by mouth 2 (two) times daily as needed  (stomach problems).    Yes [provider]  CINNAMON PO Take 1,200 mg by mouth daily.   Yes [provider]  cyanocobalamin (,VITAMIN B-12,) 1000 MCG/ML injection Inject 1,000 mcg into the muscle every 30 (thirty) days.   Yes [provider]  Insulin Glargine (LANTUS) 100 UNIT/ML Solostar Pen Inject 10 Units into the skin daily. 02/26/18  Yes Cook, Jayce G, DO  Insulin Pen Needle 32G X 4 MM MISC Use as directed to administer insulin. 02/26/18  Yes Cook, Jayce G, DO  ipratropium (ATROVENT) 0.06 % nasal spray Place 2 sprays into both nostrils 4 (four) times daily. 06/27/20  Yes Laurene Footman B, PA-C  magnesium oxide (MAG-OX) 400 MG tablet Take 400 mg by mouth daily.   Yes [provider]  metFORMIN (GLUCOPHAGE) 500 MG tablet Take 1 tablet (500 mg total) by mouth 2 (two) times daily with a meal. 02/26/18 08/24/20 Yes Cook, Jayce G, DO  nitrofurantoin, macrocrystal-monohydrate, (MACROBID) 100 MG capsule Take 1 capsule (100 mg total) by mouth 2 (two) times daily. 08/24/20  Yes Margarette [place], NP  ondansetron (ZOFRAN-ODT) 4 MG disintegrating tablet Take 4 mg by mouth every 8 (eight) hours as needed for nausea or vomiting.  11/16/15  Yes [provider]  phenazopyridine (PYRIDIUM) 200 MG tablet Take 1 tablet (200 mg total) by mouth 3 (three) times daily. 08/24/20  Yes Margarette [place], NP  Spacer/Aero-Holding Chambers (AEROCHAMBER PLUS) inhaler Use as instructed 10/20/19  Yes Melynda Ripple, MD  SYNTHROID 100 MCG tablet Take 100 mcg by mouth daily before breakfast.  04/27/14  Yes [provider]  THERATEARS 0.25 % SOLN Place 1 drop into both eyes 3 (three) times daily as needed (dry/irritated eyes.).   Yes [provider]  thyroid (ARMOUR) 30 MG tablet Take 30 mg by mouth daily before breakfast.    Yes [provider]  Turmeric 500 MG CAPS Take 500 mg by mouth daily.   Yes [provider]  XIFAXAN 550 MG TABS tablet Take 550 mg by mouth 3 (three)  times daily. 06/22/20  Yes [provider]    Family History Family History  Problem Relation Age of Onset   Breast cancer Sister 74   Crohn's disease Mother    Heart attack Father 35    Social History Social History   Tobacco Use   Smoking status: Former    Packs/day: 1.00    Years: 12.00    Pack years: 12.00    Types: Cigarettes    Quit date: 10/19/1969    Years since quitting: 50.8   Smokeless tobacco: Never  Vaping Use   Vaping Use: Never used  Substance Use Topics   Alcohol use: No   Drug use: No     Allergies   Erythromycin, Hydromorphone, Cefuroxime, Ciprocinonide [fluocinolone], Codeine, Floxin [ofloxacin], Keflex [cephalexin], Levofloxacin, Sulfa antibiotics, and Ciprofloxacin   Review of Systems Review of Systems  Constitutional:  Negative for fever.  Gastrointestinal:  Negative for abdominal pain.  Genitourinary:  Positive for dysuria, frequency and urgency. Negative for hematuria.  Musculoskeletal:  Positive for back pain.    Physical Exam Triage Vital Signs ED Triage Vitals  Enc Vitals Group     BP      Pulse      Resp      Temp      Temp src      SpO2      Weight      Height      Head Circumference      Peak Flow      Pain Score      Pain Loc      Pain Edu?      Excl. in [place]?    No data found.  Updated Vital Signs BP (!) 142/68 (BP Location: Left Arm)   Pulse 77   Temp 98 F (36.7 C) (Oral)   Resp 17   Ht _0  (1.727 m)   Wt 170 lb (77.1 kg)   BMI 25.85 kg/m   Visual Acuity Right Eye Distance:   Left Eye Distance:   Bilateral Distance:    Right Eye Near:   Left Eye Near:    Bilateral Near:     Physical Exam Vitals and nursing note reviewed.  Constitutional:      General: She is not in acute distress.    Appearance: Normal appearance. She is normal weight. She is not ill-appearing.  Cardiovascular:     Rate and Rhythm: Normal rate and regular rhythm.     Pulses: Normal pulses.     Heart sounds: Normal heart  sounds. No murmur heard.   No gallop.  Pulmonary:     Effort: Pulmonary effort is normal.     Breath sounds: Normal breath sounds. No wheezing, rhonchi or rales.  Abdominal:     Tenderness: There is no right CVA tenderness or left CVA tenderness.  Skin:    General: Skin is warm and dry.     Capillary Refill: Capillary refill takes less than 2 seconds.     Findings: No rash.  Neurological:     General: No focal deficit present.     Mental Status: She is alert and oriented to person, place, and time.  Psychiatric:        Mood and Affect: Mood normal.        Behavior: Behavior normal.        Thought Content: Thought content normal.        Judgment: Judgment normal.     UC Treatments / Results  Labs (all labs ordered are listed, but only abnormal results are displayed) Labs Reviewed  POCT URINALYSIS DIP (DEVICE) - Abnormal; Notable for the following components:      Result Value   Hgb urine dipstick SMALL (*)    Leukocytes,Ua MODERATE (*)    All other components within normal limits  URINE CULTURE  POCT URINALYSIS DIPSTICK, ED / UC    EKG   Radiology No results found.  Procedures Procedures (including critical care time)  Medications Ordered in UC Medications - No data to display  Initial Impression / Assessment and Plan / UC Course  I have reviewed the triage vital signs and the nursing notes.  Pertinent labs & imaging results that were available during my care of the patient were reviewed by me and considered in my medical decision making (see chart for details).  Patient is a very pleasant 75 year old female here for evaluation of urinary complaints as outlined in HPI above.  Patient's physical exam reveals a benign cardiopulmonary exam.  Abdomen soft and nontender.  No CVA tenderness on exam.  Urinalysis shows large leukocytes and small hemoglobin.  We will send urine for culture.  Patient has a significant number of antibiotic and medication allergies so we will  treat with Macrobid twice daily for 5 days as well as give Pyridium to help with urinary discomfort.  Patient vies that if the culture grows out an bacteria that the Macrobid will not treat we will have to have a discussion about what antibiotic to use next.  Patient verbalizes understanding of same.   Final Clinical Impressions(s) / UC Diagnoses   Final diagnoses:  Lower urinary tract infectious disease     Discharge Instructions      Take the Macrobid twice daily for 5 days with food for treatment of urinary tract infection.  Use the Pyridium every 8 hours as needed for urinary discomfort.  This will turn your urine a bright red-orange.  Increase your oral fluid intake so that you increase your urine production and or flushing your urinary system.  Take an over-the-counter probiotic, such as Culturelle-Align-Activia, 1 hour after each dose of antibiotic to prevent diarrhea or yeast infections from forming.  We will culture urine and change the antibiotics if necessary.  Return for reevaluation, or see your primary care provider, for any new or worsening symptoms.      ED Prescriptions     Medication Sig Dispense Auth. Provider   nitrofurantoin, macrocrystal-monohydrate, (MACROBID) 100 MG capsule Take 1 capsule (100 mg total) by mouth 2 (two) times daily. 10 capsule Margarette Canada, NP   phenazopyridine (PYRIDIUM) 200 MG tablet Take 1 tablet (200 mg total) by mouth 3 (three) times daily. 6 tablet Margarette Canada, NP      PDMP not reviewed this encounter.   Margarette Canada, NP 08/24/20 1914

## 2020-08-24 NOTE — ED Triage Notes (Signed)
Patient complains of urinary frequency, urgency and dysuria x 2-3 days, worsening symptoms today.

## 2020-08-27 LAB — URINE CULTURE
Culture: 30000 — AB
Special Requests: NORMAL

## 2020-08-30 ENCOUNTER — Telehealth (HOSPITAL_COMMUNITY): Payer: Self-pay | Admitting: Emergency Medicine

## 2020-08-30 MED ORDER — AMPICILLIN 500 MG PO CAPS: 500.0000 mg | ORAL_CAPSULE | Freq: Two times a day (BID) | ORAL | 0 refills | Status: AC

## 2020-10-04 ENCOUNTER — Emergency Department: Payer: Federal, State, Local not specified - PPO

## 2020-10-04 ENCOUNTER — Other Ambulatory Visit: Payer: Self-pay

## 2020-10-04 ENCOUNTER — Emergency Department
Admission: EM | Admit: 2020-10-04 | Discharge: 2020-10-04 | Disposition: A | Discharge status: Home / Self Care | Payer: Federal, State, Local not specified - PPO | Attending: Emergency Medicine | Admitting: Emergency Medicine

## 2020-10-04 DIAGNOSIS — R42 Dizziness and giddiness: Secondary | ICD-10-CM | POA: Insufficient documentation

## 2020-10-04 DIAGNOSIS — Z20822 Contact with and (suspected) exposure to covid-19: Secondary | ICD-10-CM | POA: Diagnosis not present

## 2020-10-04 DIAGNOSIS — Z96612 Presence of left artificial shoulder joint: Secondary | ICD-10-CM | POA: Insufficient documentation

## 2020-10-04 DIAGNOSIS — Z7984 Long term (current) use of oral hypoglycemic drugs: Secondary | ICD-10-CM | POA: Insufficient documentation

## 2020-10-04 DIAGNOSIS — I2510 Atherosclerotic heart disease of native coronary artery without angina pectoris: Secondary | ICD-10-CM | POA: Diagnosis not present

## 2020-10-04 DIAGNOSIS — E860 Dehydration: Secondary | ICD-10-CM

## 2020-10-04 DIAGNOSIS — Z794 Long term (current) use of insulin: Secondary | ICD-10-CM | POA: Diagnosis not present

## 2020-10-04 DIAGNOSIS — E119 Type 2 diabetes mellitus without complications: Secondary | ICD-10-CM | POA: Diagnosis not present

## 2020-10-04 DIAGNOSIS — Z87891 Personal history of nicotine dependence: Secondary | ICD-10-CM | POA: Insufficient documentation

## 2020-10-04 DIAGNOSIS — Z79899 Other long term (current) drug therapy: Secondary | ICD-10-CM | POA: Insufficient documentation

## 2020-10-04 DIAGNOSIS — E039 Hypothyroidism, unspecified: Secondary | ICD-10-CM | POA: Insufficient documentation

## 2020-10-04 LAB — CBC
HCT: 38.4 % (ref 36.0–46.0)
Hemoglobin: 12.7 g/dL (ref 12.0–15.0)
MCH: 28.3 pg (ref 26.0–34.0)
MCHC: 33.1 g/dL (ref 30.0–36.0)
MCV: 85.7 fL (ref 80.0–100.0)
Platelets: 219 10*3/uL (ref 150–400)
RBC: 4.48 MIL/uL (ref 3.87–5.11)
RDW: 15.3 % (ref 11.5–15.5)
WBC: 9.7 10*3/uL (ref 4.0–10.5)
nRBC: 0 % (ref 0.0–0.2)

## 2020-10-04 LAB — BASIC METABOLIC PANEL
Anion gap: 11 (ref 5–15)
BUN: 12 mg/dL (ref 8–23)
CO2: 20 mmol/L — ABNORMAL LOW (ref 22–32)
Calcium: 8.7 mg/dL — ABNORMAL LOW (ref 8.9–10.3)
Chloride: 104 mmol/L (ref 98–111)
Creatinine, Ser: 0.87 mg/dL (ref 0.44–1.00)
GFR, Estimated: >60 mL/min (ref >60)
Glucose, Bld: 111 mg/dL — ABNORMAL HIGH (ref 70–99)
Potassium: 3.6 mmol/L (ref 3.5–5.1)
Sodium: 135 mmol/L (ref 135–145)

## 2020-10-04 LAB — TROPONIN I (HIGH SENSITIVITY): Troponin I (High Sensitivity): 5 ng/L (ref <18)

## 2020-10-04 LAB — RESP PANEL BY RT-PCR (FLU A&B, COVID) ARPGX2
Influenza A by PCR: NEGATIVE
Influenza B by PCR: NEGATIVE
SARS Coronavirus 2 by RT PCR: NEGATIVE

## 2020-10-04 MED ORDER — SODIUM CHLORIDE 0.9 % IV SOLN
1000.0000 mL | Freq: Once | INTRAVENOUS | Status: AC
  Administered 2020-10-04: 1000 mL via INTRAVENOUS

## 2020-10-04 NOTE — ED Provider Notes (Signed)
[facility] Emergency Department Provider Note   ____________________________________________    I have reviewed the triage vital signs and the nursing notes.   HISTORY  Chief Complaint Dizziness, loose stool    HPI Joanna Ortiz is a 75 y.o. female with history of Crohn's disease, diabetes who presents with complaints of mild dizziness.  Patient reports he has been feeling fatigued today, when she stood up she briefly had an episode of dizziness and lightheadedness.  She sat down and is feeling better but still feels somewhat fatigued.  No abdominal pain.  She reports diarrhea that is chronic for her given her Crohn's disease.  No dysuria or cough or nasal congestion reported.  No fevers or body aches  Past Medical History:  Diagnosis Date   Anemia    Arthritis    Collagen vascular disease ([place])    Coronary artery disease    Crohn disease ([place])    Diabetes mellitus without complication ([place])    diet controlled   Hyperlipidemia    Hypothyroidism    Pneumonia    3/18   Thyroid disease    goiter    Patient Active Problem List   Diagnosis Date Noted   Stable angina ([place]) 04/03/2018   Right groin pain 06/22/2016   Acute bronchitis 05/01/2016   Diastasis recti 10/29/2015   Rectal bleeding 06/10/2014   AP (abdominal pain) 12/29/2013   Crohn's disease ([place]) 12/18/2013    Past Surgical History:  Procedure Laterality Date   ABDOMINAL HYSTERECTOMY  1987   APPENDECTOMY     BREAST BIOPSY Bilateral    neg   CATARACT EXTRACTION W/PHACO Right 11/06/2016   Procedure: CATARACT EXTRACTION PHACO AND INTRAOCULAR LENS PLACEMENT ([place]);  Surgeon: Birder Robson, MD;  Location: [facility] ORS;  Service: Ophthalmology;  Laterality: Right;  [place] 00:42.6 AP% 16.7 CDE 7.14 Fluid Pack Lot # [id number]   CATARACT EXTRACTION W/PHACO Left 12/25/2016   Procedure: CATARACT EXTRACTION PHACO AND INTRAOCULAR LENS PLACEMENT (IOC);  Surgeon: Birder Robson, MD;   Location: [facility] ORS;  Service: Ophthalmology;  Laterality: Left;  [place] 00:32 AP% 13.7 CDE 4.40 Fluid pack lot # [id number]   COLON SURGERY  06/06/2011   Resection of terminal ileum and previous ileocolonic anastomosis for recurrent Crohn's disease. 3 focal strictures identified.   COLONOSCOPY  2013   JOINT REPLACEMENT Left    SHOULDER   LEFT HEART CATH AND CORONARY ANGIOGRAPHY Left 04/04/2018   Procedure: LEFT HEART CATH AND CORONARY ANGIOGRAPHY;  Surgeon: Corey Skains, MD;  Location: [place] [facility];  Service: Cardiovascular;  Laterality: Left;   SHOULDER SURGERY Right 11/20/2018   Procedure: R SHOULDER ARTHROSCOPY: biceps tenomyotomy, distal claviculectomy, acromioplasty, posterior capsule release, debridement, decrompression of subacromial space, coracoacromial release, bursectomy   SMALL INTESTINE SURGERY  2012   THYROID SURGERY  1992   TONSILLECTOMY      Prior to Admission medications   Medication Sig Start Date End Date Taking? Authorizing Provider  acetaminophen (TYLENOL) 500 MG tablet Take 1,000 mg by mouth every 8 (eight) hours as needed for mild pain or moderate pain.    [provider]  Adalimumab 40 MG/0.4ML PSKT Inject 40 mg into the skin every 14 (fourteen) days.  10/03/17   [provider]  albuterol (VENTOLIN HFA) 108 (90 Base) MCG/ACT inhaler Inhale 1-2 puffs into the lungs every 4 (four) hours as needed for wheezing or shortness of breath. 10/21/19   Melynda Ripple, MD  Ascorbic Acid (VITAMIN C) 1000 MG tablet  Take 1,000 mg by mouth 3 (three) times a week.     [provider]  blood glucose meter kit and supplies KIT Dispense based on patient and insurance preference. Use up to four times daily as directed. 02/26/18   Coral Spikes, DO  cholestyramine Lucrezia Starch) 4 G packet Take 4 g by mouth 2 (two) times daily as needed (stomach problems).     [provider]  CINNAMON PO Take 1,200 mg by mouth daily.    [provider]   cyanocobalamin (,VITAMIN B-12,) 1000 MCG/ML injection Inject 1,000 mcg into the muscle every 30 (thirty) days.    [provider]  Insulin Glargine (LANTUS) 100 UNIT/ML Solostar Pen Inject 10 Units into the skin daily. 02/26/18   Coral Spikes, DO  Insulin Pen Needle 32G X 4 MM MISC Use as directed to administer insulin. 02/26/18   Thersa Salt G, DO  ipratropium (ATROVENT) 0.06 % nasal spray Place 2 sprays into both nostrils 4 (four) times daily. 06/27/20   Laurene Footman B, PA-C  magnesium oxide (MAG-OX) 400 MG tablet Take 400 mg by mouth daily.    [provider]  metFORMIN (GLUCOPHAGE) 500 MG tablet Take 1 tablet (500 mg total) by mouth 2 (two) times daily with a meal. 02/26/18 08/24/20  Thersa Salt G, DO  ondansetron (ZOFRAN-ODT) 4 MG disintegrating tablet Take 4 mg by mouth every 8 (eight) hours as needed for nausea or vomiting.  11/16/15   [provider]  phenazopyridine (PYRIDIUM) 200 MG tablet Take 1 tablet (200 mg total) by mouth 3 (three) times daily. 08/24/20   Margarette Canada, NP  Spacer/Aero-Holding Josiah Lobo (AEROCHAMBER PLUS) inhaler Use as instructed 10/20/19   Melynda Ripple, MD  SYNTHROID 100 MCG tablet Take 100 mcg by mouth daily before breakfast.  04/27/14   [provider]  THERATEARS 0.25 % SOLN Place 1 drop into both eyes 3 (three) times daily as needed (dry/irritated eyes.).    [provider]  thyroid (ARMOUR) 30 MG tablet Take 30 mg by mouth daily before breakfast.     [provider]  Turmeric 500 MG CAPS Take 500 mg by mouth daily.    [provider]  XIFAXAN 550 MG TABS tablet Take 550 mg by mouth 3 (three) times daily. 06/22/20   [provider]     Allergies Erythromycin, Hydromorphone, Cefuroxime, Ciprocinonide [fluocinolone], Codeine, Floxin [ofloxacin], Keflex [cephalexin], Levofloxacin, Sulfa antibiotics, and Ciprofloxacin  Family History  Problem Relation Age of Onset   Breast cancer Sister 85   Crohn's  disease Mother    Heart attack Father 33    Social History Social History   Tobacco Use   Smoking status: Former    Packs/day: 1.00    Years: 12.00    Pack years: 12.00    Types: Cigarettes    Quit date: 10/19/1969    Years since quitting: 50.9   Smokeless tobacco: Never  Vaping Use   Vaping Use: Never used  Substance Use Topics   Alcohol use: No   Drug use: No    Review of Systems  Constitutional: No fever/chills Eyes: No visual changes.  ENT: No sore throat. Cardiovascular: Denies chest pain.  No palpitations reported to me Respiratory: Denies shortness of breath. Gastrointestinal: No abdominal pain.  As above Genitourinary: Negative for dysuria. Musculoskeletal: Negative for back pain. Skin: Negative for rash. Neurological: Negative for headaches or weakness   ____________________________________________   PHYSICAL EXAM:  VITAL SIGNS: ED Triage Vitals  Enc  Vitals Group     BP 10/04/20 1432 (!) 145/82     Pulse Rate 10/04/20 1432 88     Resp 10/04/20 1432 20     Temp 10/04/20 1432 98.2 F (36.8 C)     Temp Source 10/04/20 1432 Oral     SpO2 10/04/20 1432 100 %     Weight 10/04/20 1432 77.1 kg (170 lb)     Height 10/04/20 1432 1.702 m ($Remove'5\' 7"'ljQObDX$ )     Head Circumference --      Peak Flow --      Pain Score 10/04/20 1440 5     Pain Loc --      Pain Edu? --      Excl. in [place]? --     Constitutional: Alert and oriented. No acute distress. Pleasant and interactive  Nose: No congestion/rhinnorhea. Mouth/Throat: Mucous membranes are dry Neck:  Painless ROM Cardiovascular: Normal rate, regular rhythm. Good peripheral circulation. Respiratory: Normal respiratory effort.  No retractions. Lungs CTAB. Gastrointestinal: Soft and nontender. No distention.   Genitourinary: deferred Musculoskeletal: No lower extremity tenderness nor edema.  Warm and well perfused Neurologic:  Normal speech and language. No gross focal neurologic deficits are appreciated.  Skin:   Skin is warm, dry and intact. No rash noted. Psychiatric: Mood and affect are normal. Speech and behavior are normal.  ____________________________________________   LABS (all labs ordered are listed, but only abnormal results are displayed)  Labs Reviewed  BASIC METABOLIC PANEL - Abnormal; Notable for the following components:      Result Value   CO2 20 (*)    Glucose, Bld 111 (*)    Calcium 8.7 (*)    All other components within normal limits  RESP PANEL BY RT-PCR (FLU A&B, COVID) ARPGX2  CBC  TROPONIN I (HIGH SENSITIVITY)   ____________________________________________  EKG  ED ECG REPORT I, Lavonia Drafts, the attending physician, personally viewed and interpreted this ECG.  Date: 10/04/2020  Rhythm: normal sinus rhythm QRS Axis: normal Intervals: normal ST/T Wave abnormalities: normal Narrative Interpretation: PVCs noted  ____________________________________________  RADIOLOGY  Chest x-ray reviewed by me, no acute abnormality ____________________________________________   PROCEDURES  Procedure(s) performed: No  Procedures   Critical Care performed: No ____________________________________________   INITIAL IMPRESSION / ASSESSMENT AND PLAN / ED COURSE  Pertinent labs & imaging results that were available during my care of the patient were reviewed by me and considered in my medical decision making (see chart for details).   Patient overall well-appearing and in no acute distress, vital signs are reassuring, exam is unremarkable.  Normal heart rate, EKG unremarkable does demonstrate occasional PVCs.  Lab work is reassuring, normal white blood cell count, normal high sensitive troponin, will send COVID test, give IV fluids and reevaluate  Patient feeling much better after IV fluids, she is ambulating well without any dizziness, COVID swab pending, proper for discharge at this time    ____________________________________________   FINAL CLINICAL  IMPRESSION(S) / ED DIAGNOSES  Final diagnoses:  Dehydration        Note:  This document was prepared using Dragon voice recognition software and may include unintentional dictation errors.    Lavonia Drafts, MD 10/04/20 2011

## 2020-10-04 NOTE — ED Triage Notes (Signed)
Pt reports that she felt like her heart was racing and she has been having diarrhea last night. A week before she went to the ED on her Vacation in [place], they tested her for C diff and COVID it was negative. They did an CT and she did not have a blockage. She does have a history of Crohns. She just got her medication for it but has not taken it yet. She states that she is starting to feel tired.

## 2020-10-04 NOTE — ED Notes (Signed)
Patient resting on stretcher, husband at bedside, provided with water.

## 2020-10-12 ENCOUNTER — Other Ambulatory Visit: Payer: Self-pay | Admitting: Gastroenterology

## 2020-10-12 DIAGNOSIS — K50019 Crohn's disease of small intestine with unspecified complications: Secondary | ICD-10-CM

## 2020-11-01 ENCOUNTER — Other Ambulatory Visit: Payer: Self-pay

## 2020-11-01 ENCOUNTER — Ambulatory Visit
Admission: RE | Admit: 2020-11-01 | Discharge: 2020-11-01 | Disposition: A | Discharge status: Home / Self Care | Payer: Federal, State, Local not specified - PPO | Source: Ambulatory Visit | Attending: Gastroenterology | Admitting: Gastroenterology

## 2020-11-01 DIAGNOSIS — K50019 Crohn's disease of small intestine with unspecified complications: Secondary | ICD-10-CM | POA: Insufficient documentation

## 2020-11-01 MED ORDER — IOHEXOL 350 MG/ML SOLN
100.0000 mL | Freq: Once | INTRAVENOUS | Status: AC | PRN
  Administered 2020-11-01: 100 mL via INTRAVENOUS

## 2020-11-07 ENCOUNTER — Other Ambulatory Visit
Admission: RE | Admit: 2020-11-07 | Discharge: 2020-11-07 | Disposition: A | Discharge status: Home / Self Care | Payer: Federal, State, Local not specified - PPO | Attending: Otolaryngology | Admitting: Otolaryngology

## 2020-11-07 DIAGNOSIS — E890 Postprocedural hypothyroidism: Secondary | ICD-10-CM | POA: Diagnosis present

## 2020-11-07 LAB — TSH: TSH: 0.591 u[IU]/mL (ref 0.350–4.500)

## 2020-11-07 LAB — T4, FREE: Free T4: 0.75 ng/dL (ref 0.61–1.12)

## 2020-11-08 LAB — T3, FREE: T3, Free: 2.2 pg/mL (ref 2.0–4.4)

## 2020-12-31 ENCOUNTER — Encounter: Payer: Self-pay | Admitting: Licensed Clinical Social Worker

## 2020-12-31 ENCOUNTER — Ambulatory Visit
Admission: EM | Admit: 2020-12-31 | Discharge: 2020-12-31 | Disposition: A | Discharge status: Home / Self Care | Payer: Federal, State, Local not specified - PPO | Attending: Emergency Medicine | Admitting: Emergency Medicine

## 2020-12-31 DIAGNOSIS — N3001 Acute cystitis with hematuria: Secondary | ICD-10-CM | POA: Diagnosis not present

## 2020-12-31 LAB — URINALYSIS, COMPLETE (UACMP) WITH MICROSCOPIC
Bilirubin Urine: NEGATIVE
Glucose, UA: NEGATIVE mg/dL
Ketones, ur: NEGATIVE mg/dL
Leukocytes,Ua: NEGATIVE
Nitrite: NEGATIVE
Protein, ur: NEGATIVE mg/dL
Specific Gravity, Urine: 1.02 (ref 1.005–1.030)
pH: 5 (ref 5.0–8.0)

## 2020-12-31 MED ORDER — AMOXICILLIN-POT CLAVULANATE 875-125 MG PO TABS: 1.0000 | ORAL_TABLET | Freq: Two times a day (BID) | ORAL | 0 refills | Status: AC

## 2020-12-31 MED ORDER — PHENAZOPYRIDINE HCL 200 MG PO TABS: 200.0000 mg | ORAL_TABLET | Freq: Three times a day (TID) | ORAL | 0 refills | Status: DC | PRN

## 2020-12-31 NOTE — ED Triage Notes (Signed)
Pt c/o urinary frequency and pain with urination. Sxs x 1 week.

## 2020-12-31 NOTE — ED Provider Notes (Signed)
HPI  SUBJECTIVE:  Joanna Ortiz is a 75 y.o. female who presents with 1 week of dysuria, urgency, frequency, urinating small amounts at a time, body aches and occasional intermittent bilateral low back pain starting recently.  No cloudy or odorous urine, hematuria, vaginal odor, itching, discharge, bleeding.  She states that she has been having a lot of accidents recently with her Crohn's.  No fevers, abdominal, pelvic pain.  She has been taking Tylenol, pushing fluids with improvement in her symptoms.  No aggravating factors.  She states that she is not sexually active.  She has a past medical history of Crohn's, COVID, UTI, pyelonephritis/urosepsis that required ICU admission, remote history of yeast vaginitis, GI bleed, and diabetes.  She is currently on prednisone and states that her glucose has been elevated recently because of this.  No history of nephrolithiasis, chronic kidney disease, hypertension, BV.  JAS:[id number], Rubbie Battiest, MD   Past Medical History:  Diagnosis Date   Anemia    Arthritis    Collagen vascular disease ([place])    Coronary artery disease    Crohn disease ([place])    Diabetes mellitus without complication ([place])    diet controlled   Hyperlipidemia    Hypothyroidism    Pneumonia    3/18   Thyroid disease    goiter    Past Surgical History:  Procedure Laterality Date   ABDOMINAL HYSTERECTOMY  1987   APPENDECTOMY     BREAST BIOPSY Bilateral    neg   CATARACT EXTRACTION W/PHACO Right 11/06/2016   Procedure: CATARACT EXTRACTION PHACO AND INTRAOCULAR LENS PLACEMENT ([place]);  Surgeon: Birder Robson, MD;  Location: [facility] ORS;  Service: Ophthalmology;  Laterality: Right;  [place] 00:42.6 AP% 16.7 CDE 7.14 Fluid Pack Lot # [id number]   CATARACT EXTRACTION W/PHACO Left 12/25/2016   Procedure: CATARACT EXTRACTION PHACO AND INTRAOCULAR LENS PLACEMENT (IOC);  Surgeon: Birder Robson, MD;  Location: [facility] ORS;  Service: Ophthalmology;  Laterality: Left;  [place] 00:32 AP% 13.7 CDE  4.40 Fluid pack lot # [id number]   COLON SURGERY  06/06/2011   Resection of terminal ileum and previous ileocolonic anastomosis for recurrent Crohn's disease. 3 focal strictures identified.   COLONOSCOPY  2013   JOINT REPLACEMENT Left    SHOULDER   LEFT HEART CATH AND CORONARY ANGIOGRAPHY Left 04/04/2018   Procedure: LEFT HEART CATH AND CORONARY ANGIOGRAPHY;  Surgeon: Corey Skains, MD;  Location: [place] [facility];  Service: Cardiovascular;  Laterality: Left;   SHOULDER SURGERY Right 11/20/2018   Procedure: R SHOULDER ARTHROSCOPY: biceps tenomyotomy, distal claviculectomy, acromioplasty, posterior capsule release, debridement, decrompression of subacromial space, coracoacromial release, bursectomy   SMALL INTESTINE SURGERY  2012   THYROID SURGERY  1992   TONSILLECTOMY      Family History  Problem Relation Age of Onset   Breast cancer Sister 58   Crohn's disease Mother    Heart attack Father 82    Social History   Tobacco Use   Smoking status: Former    Packs/day: 1.00    Years: 12.00    Pack years: 12.00    Types: Cigarettes    Quit date: 10/19/1969    Years since quitting: 51.2   Smokeless tobacco: Never  Vaping Use   Vaping Use: Never used  Substance Use Topics   Alcohol use: No   Drug use: No    No current facility-administered medications for this encounter.  Current Outpatient Medications:    acetaminophen (TYLENOL) 500 MG tablet, Take 1,000 mg by mouth  every 8 (eight) hours as needed for mild pain or moderate pain., Disp: , Rfl:    Adalimumab 40 MG/0.4ML PSKT, Inject 40 mg into the skin every 14 (fourteen) days. , Disp: , Rfl:    albuterol (VENTOLIN HFA) 108 (90 Base) MCG/ACT inhaler, Inhale 1-2 puffs into the lungs every 4 (four) hours as needed for wheezing or shortness of breath., Disp: 1 each, Rfl: 0   amoxicillin-clavulanate (AUGMENTIN) 875-125 MG tablet, Take 1 tablet by mouth 2 (two) times daily for 10 days., Disp: 20 tablet, Rfl: 0   Ascorbic Acid  (VITAMIN C) 1000 MG tablet, Take 1,000 mg by mouth 3 (three) times a week. , Disp: , Rfl:    blood glucose meter kit and supplies KIT, Dispense based on patient and insurance preference. Use up to four times daily as directed., Disp: 1 each, Rfl: 0   cholestyramine (QUESTRAN) 4 G packet, Take 4 g by mouth 2 (two) times daily as needed (stomach problems). , Disp: , Rfl: 12   CINNAMON PO, Take 1,200 mg by mouth daily., Disp: , Rfl:    cyanocobalamin (,VITAMIN B-12,) 1000 MCG/ML injection, Inject 1,000 mcg into the muscle every 30 (thirty) days., Disp: , Rfl:    Insulin Glargine (LANTUS) 100 UNIT/ML Solostar Pen, Inject 10 Units into the skin daily., Disp: 15 mL, Rfl: 1   Insulin Pen Needle 32G X 4 MM MISC, Use as directed to administer insulin., Disp: 100 each, Rfl: 1   ipratropium (ATROVENT) 0.06 % nasal spray, Place 2 sprays into both nostrils 4 (four) times daily., Disp: 15 mL, Rfl: 12   magnesium oxide (MAG-OX) 400 MG tablet, Take 400 mg by mouth daily., Disp: , Rfl:    ondansetron (ZOFRAN-ODT) 4 MG disintegrating tablet, Take 4 mg by mouth every 8 (eight) hours as needed for nausea or vomiting. , Disp: , Rfl:    phenazopyridine (PYRIDIUM) 200 MG tablet, Take 1 tablet (200 mg total) by mouth 3 (three) times daily as needed for pain., Disp: 6 tablet, Rfl: 0   Spacer/Aero-Holding Chambers (AEROCHAMBER PLUS) inhaler, Use as instructed, Disp: 1 each, Rfl: 2   SYNTHROID 100 MCG tablet, Take 100 mcg by mouth daily before breakfast. , Disp: , Rfl: 11   THERATEARS 0.25 % SOLN, Place 1 drop into both eyes 3 (three) times daily as needed (dry/irritated eyes.)., Disp: , Rfl:    thyroid (ARMOUR) 30 MG tablet, Take 30 mg by mouth daily before breakfast. , Disp: , Rfl:    Turmeric 500 MG CAPS, Take 500 mg by mouth daily., Disp: , Rfl:    XIFAXAN 550 MG TABS tablet, Take 550 mg by mouth 3 (three) times daily., Disp: , Rfl:    metFORMIN (GLUCOPHAGE) 500 MG tablet, Take 1 tablet (500 mg total) by mouth 2 (two)  times daily with a meal., Disp: 60 tablet, Rfl: 1  Allergies  Allergen Reactions   Erythromycin Hives   Hydromorphone Hives   Cefuroxime Nausea And Vomiting   Ciprocinonide [Fluocinolone]     Muscle pain    Codeine     Other reaction(s): Vomiting   Floxin [Ofloxacin] Nausea And Vomiting   Keflex [Cephalexin] Nausea And Vomiting   Levofloxacin Nausea And Vomiting   Sulfa Antibiotics Nausea And Vomiting   Ciprofloxacin Nausea And Vomiting     ROS  As noted in HPI.   Physical Exam  BP 135/70 (BP Location: Left Arm)   Pulse 81   Temp 97.9 F (36.6 C) (Oral)   Resp 16  Ht '5\' 7"'$  (1.702 m)   Wt 76.7 kg   SpO2 100%   BMI 26.47 kg/m   Constitutional: Well developed, well nourished, no acute distress Eyes:  EOMI, conjunctiva normal bilaterally HENT: Normocephalic, atraumatic,mucus membranes moist Respiratory: Normal inspiratory effort Cardiovascular: Normal rate GI: nondistended soft.  Positive suprapubic tenderness.  No flank tenderness. Back: No CVAT. skin: No rash, skin intact Musculoskeletal: no deformities Neurologic: Alert & oriented x 3, no focal neuro deficits Psychiatric: Speech and behavior appropriate   ED Course   Medications - No data to display  Orders Placed This Encounter  Procedures   Urine Culture    Standing Status:   Standing    Number of Occurrences:   1    Order Specific Question:   Indication    Answer:   Dysuria   Urinalysis, Complete w Microscopic Urine, Clean Catch    Standing Status:   Standing    Number of Occurrences:   1    Results for orders placed or performed during the hospital encounter of 12/31/20 (from the past 24 hour(s))  Urinalysis, Complete w Microscopic Urine, Clean Catch     Status: Abnormal   Collection Time: 12/31/20  9:39 AM  Result Value Ref Range   Color, Urine YELLOW YELLOW   APPearance CLEAR CLEAR   Specific Gravity, Urine 1.020 1.005 - 1.030   pH 5.0 5.0 - 8.0   Glucose, UA NEGATIVE NEGATIVE mg/dL    Hgb urine dipstick TRACE (A) NEGATIVE   Bilirubin Urine NEGATIVE NEGATIVE   Ketones, ur NEGATIVE NEGATIVE mg/dL   Protein, ur NEGATIVE NEGATIVE mg/dL   Nitrite NEGATIVE NEGATIVE   Leukocytes,Ua NEGATIVE NEGATIVE   Squamous Epithelial / LPF 0-5 0 - 5   WBC, UA 6-10 0 - 5 WBC/hpf   RBC / HPF 6-10 0 - 5 RBC/hpf   Bacteria, UA FEW (A) NONE SEEN   No results found.  ED Clinical Impression  1. Acute cystitis with hematuria      ED Assessment/Plan  Last urine culture grew E. coli that was resistant to Macrobid.  Otherwise pansensitive.  H&P concerning for a complicated urinary tract infection.  Doubt gynecologic infection.  She is not sexually active nor has any vaginal complaints.  UA positive for few bacteria and trace hematuria. this is a clean-catch.  will send off for culture to confirm antibiotic choice.  Creatinine clearance 67 mL/min.  Do not need to renally dose Pyridium or Augmentin.  Patient unable to tolerate Keflex, Cipro and Bactrim.  She states that she can tolerate amoxicillin.  Will send home with Augmentin 875 mg twice daily for 10 days, Pyridium.  Deferring Rocephin as she has no fevers, has normal vitals here, no CVAT that would be concerning for pyelonephritis.    Discussed labs, MDM, treatment plan, and plan for follow-up with patient. Discussed sn/sx that should prompt return to the ED. patient agrees with plan.   Meds ordered this encounter  Medications   phenazopyridine (PYRIDIUM) 200 MG tablet    Sig: Take 1 tablet (200 mg total) by mouth 3 (three) times daily as needed for pain.    Dispense:  6 tablet    Refill:  0   amoxicillin-clavulanate (AUGMENTIN) 875-125 MG tablet    Sig: Take 1 tablet by mouth 2 (two) times daily for 10 days.    Dispense:  20 tablet    Refill:  0      *This clinic note was created using [employer]. Therefore, there  may be occasional mistakes despite careful proofreading.  ?    Melynda Ripple, MD 01/02/21  [phone]

## 2020-12-31 NOTE — Discharge Instructions (Addendum)
Continue drinking extra fluids, taking 1000 mg of Tylenol 3-4 times a day as needed for pain.  Pyridium will turn your urine orange, but will help with the urinary symptoms.  Finish the Augmentin, even if you feel better, unless a provider tells you to stop

## 2021-01-02 LAB — URINE CULTURE: Culture: 70000 — AB

## 2021-01-28 ENCOUNTER — Ambulatory Visit
Admission: EM | Admit: 2021-01-28 | Discharge: 2021-01-28 | Disposition: A | Discharge status: Home / Self Care | Payer: Federal, State, Local not specified - PPO | Attending: Physician Assistant | Admitting: Physician Assistant

## 2021-01-28 ENCOUNTER — Encounter: Payer: Self-pay | Admitting: Licensed Clinical Social Worker

## 2021-01-28 ENCOUNTER — Other Ambulatory Visit: Payer: Self-pay

## 2021-01-28 DIAGNOSIS — I10 Essential (primary) hypertension: Secondary | ICD-10-CM | POA: Diagnosis not present

## 2021-01-28 DIAGNOSIS — R519 Headache, unspecified: Secondary | ICD-10-CM

## 2021-01-28 DIAGNOSIS — K0889 Other specified disorders of teeth and supporting structures: Secondary | ICD-10-CM | POA: Insufficient documentation

## 2021-01-28 DIAGNOSIS — R197 Diarrhea, unspecified: Secondary | ICD-10-CM | POA: Diagnosis not present

## 2021-01-28 LAB — C DIFFICILE QUICK SCREEN W PCR REFLEX
C Diff antigen: NEGATIVE
C Diff interpretation: NOT DETECTED
C Diff toxin: NEGATIVE

## 2021-01-28 MED ORDER — LIDOCAINE VISCOUS HCL 2 % MT SOLN: 15.0000 mL | OROMUCOSAL | 0 refills | Status: DC | PRN

## 2021-01-28 MED ORDER — TRAMADOL HCL 50 MG PO TABS: 50.0000 mg | ORAL_TABLET | Freq: Three times a day (TID) | ORAL | 0 refills | Status: AC | PRN

## 2021-01-28 NOTE — ED Provider Notes (Signed)
[facility]    CSN: [id number] Arrival date & time: 01/28/21  0944      History   Chief Complaint Chief Complaint  Patient presents with   Mouth Lesions    HPI Joanna Ortiz is a 75 y.o. female presenting for abdominal pain.  Patient says she had her lower teeth pulled 2 weeks ago.  Reports the false teeth she has been wearing seem to rub the gums and because of sore.  She says she contacted her dentist a few days ago and advised that she had sores in her mouth.  States she was told they could not see her for over a week.  She says they did call in clindamycin in case there is a possible infection.  She took a couple doses of clindamycin and then started to have significant diarrhea.  Reports over 20 episodes of diarrhea in the past 24 hours.  Diarrhea started sometime yesterday.  Patient does have a history of Crohn's and reports she gets flareups every now and then but generally has about 5-10 episodes with a flareup and never as many as 20.  Admits to a little bit of abdominal cramping but denies pain.  No associated fevers.  She has been fatigued and had some headaches over the past couple of days.  Patient does have history of C. difficile.  Other medical history is significant for diet-controlled diabetes and collagen vascular disease.  HPI  Past Medical History:  Diagnosis Date   Anemia    Arthritis    Collagen vascular disease ([place])    Coronary artery disease    Crohn disease ([place])    Diabetes mellitus without complication ([place])    diet controlled   Hyperlipidemia    Hypothyroidism    Pneumonia    3/18   Thyroid disease    goiter    Patient Active Problem List   Diagnosis Date Noted   Stable angina ([place]) 04/03/2018   Right groin pain 06/22/2016   Acute bronchitis 05/01/2016   Diastasis recti 10/29/2015   Rectal bleeding 06/10/2014   AP (abdominal pain) 12/29/2013   Crohn's disease ([place]) 12/18/2013    Past Surgical History:  Procedure  Laterality Date   ABDOMINAL HYSTERECTOMY  1987   APPENDECTOMY     BREAST BIOPSY Bilateral    neg   CATARACT EXTRACTION W/PHACO Right 11/06/2016   Procedure: CATARACT EXTRACTION PHACO AND INTRAOCULAR LENS PLACEMENT ([place]);  Surgeon: Birder Robson, MD;  Location: [facility];  Service: Ophthalmology;  Laterality: Right;  [place] 00:42.6 AP% 16.7 CDE 7.14 Fluid Pack Lot # [id number]   CATARACT EXTRACTION W/PHACO Left 12/25/2016   Procedure: CATARACT EXTRACTION PHACO AND INTRAOCULAR LENS PLACEMENT ([facility]);  Surgeon: Birder Robson, MD;  Location: [facility];  Service: Ophthalmology;  Laterality: Left;  [place] 00:32 AP% 13.7 CDE 4.40 Fluid pack lot # [id number]   COLON SURGERY  06/06/2011   Resection of terminal ileum and previous ileocolonic anastomosis for recurrent Crohn's disease. 3 focal strictures identified.   COLONOSCOPY  2013   JOINT REPLACEMENT Left    SHOULDER   LEFT HEART CATH AND CORONARY ANGIOGRAPHY Left 04/04/2018   Procedure: LEFT HEART CATH AND CORONARY ANGIOGRAPHY;  Surgeon: Corey Skains, MD;  Location: [place] [facility];  Service: Cardiovascular;  Laterality: Left;   SHOULDER SURGERY Right 11/20/2018   Procedure: R SHOULDER ARTHROSCOPY: biceps tenomyotomy, distal claviculectomy, acromioplasty, posterior capsule release, debridement, decrompression of subacromial space, coracoacromial release, bursectomy   SMALL INTESTINE SURGERY  2012  THYROID SURGERY  1992   TONSILLECTOMY      OB History     Gravida  6   Para  4   Term      Preterm      AB  2   Living  4      SAB  2   IAB      Ectopic      Multiple      Live Births           Obstetric Comments  1st Menstrual Cycle:  14  1st Pregnancy: 19          Home Medications    Prior to Admission medications   Medication Sig Start Date End Date Taking? Authorizing Provider  lidocaine (XYLOCAINE) 2 % solution Use as directed 15 mLs in the mouth or throat every 3 (three) hours as needed for mouth pain.  01/28/21  Yes Laurene Footman B, PA-C  traMADol (ULTRAM) 50 MG tablet Take 1 tablet (50 mg total) by mouth every 8 (eight) hours as needed for up to 5 days for severe pain. 01/28/21 02/02/21 Yes Danton Clap, PA-C  acetaminophen (TYLENOL) 500 MG tablet Take 1,000 mg by mouth every 8 (eight) hours as needed for mild pain or moderate pain.    [provider]  Adalimumab 40 MG/0.4ML PSKT Inject 40 mg into the skin every 14 (fourteen) days.  10/03/17   [provider]  albuterol (VENTOLIN HFA) 108 (90 Base) MCG/ACT inhaler Inhale 1-2 puffs into the lungs every 4 (four) hours as needed for wheezing or shortness of breath. 10/21/19   Melynda Ripple, MD  Ascorbic Acid (VITAMIN C) 1000 MG tablet Take 1,000 mg by mouth 3 (three) times a week.     [provider]  blood glucose meter kit and supplies KIT Dispense based on patient and insurance preference. Use up to four times daily as directed. 02/26/18   Coral Spikes, DO  cholestyramine Lucrezia Starch) 4 G packet Take 4 g by mouth 2 (two) times daily as needed (stomach problems).     [provider]  CINNAMON PO Take 1,200 mg by mouth daily.    [provider]  cyanocobalamin (,VITAMIN B-12,) 1000 MCG/ML injection Inject 1,000 mcg into the muscle every 30 (thirty) days.    [provider]  Insulin Glargine (LANTUS) 100 UNIT/ML Solostar Pen Inject 10 Units into the skin daily. 02/26/18   Coral Spikes, DO  Insulin Pen Needle 32G X 4 MM MISC Use as directed to administer insulin. 02/26/18   Thersa Salt G, DO  ipratropium (ATROVENT) 0.06 % nasal spray Place 2 sprays into both nostrils 4 (four) times daily. 06/27/20   Laurene Footman B, PA-C  magnesium oxide (MAG-OX) 400 MG tablet Take 400 mg by mouth daily.    [provider]  metFORMIN (GLUCOPHAGE) 500 MG tablet Take 1 tablet (500 mg total) by mouth 2 (two) times daily with a meal. 02/26/18 08/24/20  Thersa Salt G, DO  ondansetron (ZOFRAN-ODT) 4 MG disintegrating  tablet Take 4 mg by mouth every 8 (eight) hours as needed for nausea or vomiting.  11/16/15   [provider]  phenazopyridine (PYRIDIUM) 200 MG tablet Take 1 tablet (200 mg total) by mouth 3 (three) times daily as needed for pain. 12/31/20   Melynda Ripple, MD  Spacer/Aero-Holding Chambers (AEROCHAMBER PLUS) inhaler Use as instructed 10/20/19   Melynda Ripple, MD  SYNTHROID 100 MCG tablet Take 100 mcg by mouth daily before  breakfast.  04/27/14   [provider]  THERATEARS 0.25 % SOLN Place 1 drop into both eyes 3 (three) times daily as needed (dry/irritated eyes.).    [provider]  thyroid (ARMOUR) 30 MG tablet Take 30 mg by mouth daily before breakfast.     [provider]  Turmeric 500 MG CAPS Take 500 mg by mouth daily.    [provider]  XIFAXAN 550 MG TABS tablet Take 550 mg by mouth 3 (three) times daily. 06/22/20   [provider]    Family History Family History  Problem Relation Age of Onset   Breast cancer Sister 78   Crohn's disease Mother    Heart attack Father 35    Social History Social History   Tobacco Use   Smoking status: Former    Packs/day: 1.00    Years: 12.00    Pack years: 12.00    Types: Cigarettes    Quit date: 10/19/1969    Years since quitting: 51.3   Smokeless tobacco: Never  Vaping Use   Vaping Use: Never used  Substance Use Topics   Alcohol use: No   Drug use: No     Allergies   Erythromycin, Hydromorphone, Cefuroxime, Ciprocinonide [fluocinolone], Codeine, Floxin [ofloxacin], Keflex [cephalexin], Levofloxacin, Sulfa antibiotics, and Ciprofloxacin   Review of Systems Review of Systems  Constitutional:  Positive for fatigue. Negative for fever.  HENT:  Positive for dental problem. Negative for congestion, facial swelling and rhinorrhea.   Eyes:  Negative for visual disturbance.  Respiratory:  Negative for cough and shortness of breath.   Cardiovascular:  Negative for chest pain.   Gastrointestinal:  Positive for diarrhea. Negative for abdominal pain, blood in stool, nausea and vomiting.  Neurological:  Positive for headaches. Negative for dizziness, weakness and numbness.  Hematological:  Negative for adenopathy.  Psychiatric/Behavioral:  Negative for confusion.     Physical Exam Triage Vital Signs ED Triage Vitals  Enc Vitals Group     BP 01/28/21 1045 (!) 180/83     Pulse Rate 01/28/21 1045 91     Resp 01/28/21 1045 16     Temp 01/28/21 1045 97.8 F (36.6 C)     Temp Source 01/28/21 1045 Oral     SpO2 01/28/21 1045 100 %     Weight 01/28/21 1044 169 lb 1.5 oz (76.7 kg)     Height 01/28/21 1044 $RemoveBefor'5\' 7"'NfhknMvxijtD$  (1.702 m)     Head Circumference --      Peak Flow --      Pain Score 01/28/21 1043 10     Pain Loc --      Pain Edu? --      Excl. in [place]? --    No data found.  Updated Vital Signs BP (!) 155/88 (BP Location: Left Arm)   Pulse 91   Temp 97.8 F (36.6 C) (Oral)   Resp 16   Ht $R'5\' 7"'RJ$  (1.702 m)   Wt 169 lb 1.5 oz (76.7 kg)   SpO2 100%   BMI 26.48 kg/m      Physical Exam Vitals and nursing note reviewed.  Constitutional:      General: She is not in acute distress.    Appearance: Normal appearance. She is not ill-appearing or toxic-appearing.  HENT:     Head: Normocephalic and atraumatic.     Nose: Nose normal.     Mouth/Throat:     Mouth: Mucous membranes are moist.     Pharynx: Oropharynx is clear.  Comments: Teeth of lower jaw removed. Gingiva mildly swollen but not erythematous. Diffusely tender. Small area of ulceration that appears to be healing.  Eyes:     General: No scleral icterus.       Right eye: No discharge.        Left eye: No discharge.     Conjunctiva/sclera: Conjunctivae normal.  Cardiovascular:     Rate and Rhythm: Normal rate and regular rhythm.     Heart sounds: Normal heart sounds.  Pulmonary:     Effort: Pulmonary effort is normal. No respiratory distress.     Breath sounds: Normal breath sounds.  Abdominal:      General: Bowel sounds are normal.     Palpations: Abdomen is soft.     Tenderness: There is no abdominal tenderness.  Musculoskeletal:     Cervical back: Neck supple.  Skin:    General: Skin is dry.  Neurological:     General: No focal deficit present.     Mental Status: She is alert. Mental status is at baseline.     Motor: No weakness.     Gait: Gait normal.  Psychiatric:        Mood and Affect: Mood normal.        Behavior: Behavior normal.        Thought Content: Thought content normal.     UC Treatments / Results  Labs (all labs ordered are listed, but only abnormal results are displayed) Labs Reviewed  C DIFFICILE QUICK SCREEN W PCR REFLEX      EKG   Radiology No results found.  Procedures Procedures (including critical care time)  Medications Ordered in UC Medications - No data to display  Initial Impression / Assessment and Plan / UC Course  I have reviewed the triage vital signs and the nursing notes.  Pertinent labs & imaging results that were available during my care of the patient were reviewed by me and considered in my medical decision making (see chart for details).   75 y/o female presenting for dental pain following removal of teeth a couple of weeks ago.  Patient also has a mouth lesion.  Additionally she complains of diarrhea after taking clindamycin.  Patient has history of Crohn's and also have C. difficile.  Vitals are stable today although her blood pressure was initially very high at 180/83.  Recheck is 155/88.  She is afebrile.  She is overall well-appearing.  On exam she has no evidence of dental infection but she does have some swelling where her teeth have been removed on the lower jaw.  The area is also tender and she has a small superficial ulceration.  Sent viscous lidocaine to pharmacy as well as short supply of tramadol.  Patient says she has taken this before and done well with it.  She does have appointment to see her dentist in 6  days.  Advised her to keep appointment.  C. difficile test ordered on patient stool collected.  Advised her we will contact her with result.  Results of C. difficile test are negative.  Patient notified of result.  Advised her to discontinue the clindamycin as there is no sign of infection anyway and I do not want her to get an infection.  Advised increasing fluid intake and following up with GI specialist if the diarrhea does not slow down.  Advised going to ED for any fevers, worsening of the headache or any neurological signs/symptoms, signs of dehydration or uncontrollable diarrhea or weakness.  Otherwise, make appointment with PCP for this coming week as well.   Final Clinical Impressions(s) / UC Diagnoses   Final diagnoses:  Pain, dental  Diarrhea, unspecified type  Acute nonintractable headache, unspecified headache type  Essential hypertension     Discharge Instructions      -We will call with the results of your C. difficile testing.  It should be today or early tomorrow. - Do not take any more of the clindamycin. - I have sent in lidocaine that you can rub on the area to help with pain as well as a oral pain medication if needed. - Your blood pressure is very high in the clinic today.  Make sure you take your medications as directed and follow-up with your PCP if it is over 130/80 consistently.  Also if your headache does not improve or worsens or you start to experience increased dizziness, nausea with vomiting, numbness, weakness or tingling or have speech issues or balance problems you should go to the emergency room immediately.  Preferable to call 911.     ED Prescriptions     Medication Sig Dispense Auth. Provider   lidocaine (XYLOCAINE) 2 % solution Use as directed 15 mLs in the mouth or throat every 3 (three) hours as needed for mouth pain. 100 mL Laurene Footman B, PA-C   traMADol (ULTRAM) 50 MG tablet Take 1 tablet (50 mg total) by mouth every 8 (eight) hours as  needed for up to 5 days for severe pain. 15 tablet Danton Clap, PA-C      I have reviewed the PDMP during this encounter.   Danton Clap, PA-C 01/28/21 1358

## 2021-01-28 NOTE — ED Triage Notes (Signed)
Pt c/o mouth pain and infection. Pt got a rx for clindamycin possible tooth infection. Pt has Chron's disease and has diarrhea from the antibiotic. Pt is also c/o of headache that is a 10 in pain.

## 2021-01-28 NOTE — Discharge Instructions (Addendum)
-  We will call with the results of your C. difficile testing.  It should be today or early tomorrow. - Do not take any more of the clindamycin. - I have sent in lidocaine that you can rub on the area to help with pain as well as a oral pain medication if needed. - Your blood pressure is very high in the clinic today.  Make sure you take your medications as directed and follow-up with your PCP if it is over 130/80 consistently.  Also if your headache does not improve or worsens or you start to experience increased dizziness, nausea with vomiting, numbness, weakness or tingling or have speech issues or balance problems you should go to the emergency room immediately.  Preferable to call 911.

## 2021-01-30 ENCOUNTER — Other Ambulatory Visit
Admission: RE | Admit: 2021-01-30 | Discharge: 2021-01-30 | Disposition: A | Discharge status: Home / Self Care | Payer: Federal, State, Local not specified - PPO | Attending: Ophthalmology | Admitting: Ophthalmology

## 2021-01-30 DIAGNOSIS — R519 Headache, unspecified: Secondary | ICD-10-CM | POA: Diagnosis not present

## 2021-01-30 DIAGNOSIS — H5710 Ocular pain, unspecified eye: Secondary | ICD-10-CM | POA: Diagnosis not present

## 2021-01-30 LAB — CBC WITH DIFFERENTIAL/PLATELET
Abs Immature Granulocytes: 0.04 10*3/uL (ref 0.00–0.07)
Basophils Absolute: 0 10*3/uL (ref 0.0–0.1)
Basophils Relative: 0 %
Eosinophils Absolute: 0.1 10*3/uL (ref 0.0–0.5)
Eosinophils Relative: 1 %
HCT: 35 % — ABNORMAL LOW (ref 36.0–46.0)
Hemoglobin: 11.7 g/dL — ABNORMAL LOW (ref 12.0–15.0)
Immature Granulocytes: 0 %
Lymphocytes Relative: 35 %
Lymphs Abs: 3.2 10*3/uL (ref 0.7–4.0)
MCH: 29.2 pg (ref 26.0–34.0)
MCHC: 33.4 g/dL (ref 30.0–36.0)
MCV: 87.3 fL (ref 80.0–100.0)
Monocytes Absolute: 0.6 10*3/uL (ref 0.1–1.0)
Monocytes Relative: 7 %
Neutro Abs: 5.2 10*3/uL (ref 1.7–7.7)
Neutrophils Relative %: 57 %
Platelets: 179 10*3/uL (ref 150–400)
RBC: 4.01 MIL/uL (ref 3.87–5.11)
RDW: 15.3 % (ref 11.5–15.5)
WBC: 9.1 10*3/uL (ref 4.0–10.5)
nRBC: 0 % (ref 0.0–0.2)

## 2021-01-30 LAB — SEDIMENTATION RATE: Sed Rate: 37 mm/hr — ABNORMAL HIGH (ref 0–30)

## 2021-01-31 LAB — C-REACTIVE PROTEIN: CRP: 1.1 mg/dL — ABNORMAL HIGH (ref <1.0)

## 2021-02-01 ENCOUNTER — Emergency Department: Payer: Federal, State, Local not specified - PPO

## 2021-02-01 ENCOUNTER — Other Ambulatory Visit: Payer: Self-pay

## 2021-02-01 ENCOUNTER — Emergency Department
Admission: EM | Admit: 2021-02-01 | Discharge: 2021-02-01 | Disposition: A | Discharge status: Home / Self Care | Payer: Federal, State, Local not specified - PPO | Attending: Emergency Medicine | Admitting: Emergency Medicine

## 2021-02-01 DIAGNOSIS — I2510 Atherosclerotic heart disease of native coronary artery without angina pectoris: Secondary | ICD-10-CM | POA: Diagnosis not present

## 2021-02-01 DIAGNOSIS — Z87891 Personal history of nicotine dependence: Secondary | ICD-10-CM | POA: Insufficient documentation

## 2021-02-01 DIAGNOSIS — Z794 Long term (current) use of insulin: Secondary | ICD-10-CM | POA: Diagnosis not present

## 2021-02-01 DIAGNOSIS — R42 Dizziness and giddiness: Secondary | ICD-10-CM | POA: Insufficient documentation

## 2021-02-01 DIAGNOSIS — E039 Hypothyroidism, unspecified: Secondary | ICD-10-CM | POA: Insufficient documentation

## 2021-02-01 DIAGNOSIS — Z79899 Other long term (current) drug therapy: Secondary | ICD-10-CM | POA: Insufficient documentation

## 2021-02-01 DIAGNOSIS — Z7984 Long term (current) use of oral hypoglycemic drugs: Secondary | ICD-10-CM | POA: Insufficient documentation

## 2021-02-01 DIAGNOSIS — E119 Type 2 diabetes mellitus without complications: Secondary | ICD-10-CM | POA: Diagnosis not present

## 2021-02-01 DIAGNOSIS — R519 Headache, unspecified: Secondary | ICD-10-CM | POA: Diagnosis present

## 2021-02-01 DIAGNOSIS — Z96612 Presence of left artificial shoulder joint: Secondary | ICD-10-CM | POA: Insufficient documentation

## 2021-02-01 LAB — COMPREHENSIVE METABOLIC PANEL
ALT: 14 U/L (ref 0–44)
AST: 17 U/L (ref 15–41)
Albumin: 3.3 g/dL — ABNORMAL LOW (ref 3.5–5.0)
Alkaline Phosphatase: 78 U/L (ref 38–126)
Anion gap: 7 (ref 5–15)
BUN: 18 mg/dL (ref 8–23)
CO2: 23 mmol/L (ref 22–32)
Calcium: 8.3 mg/dL — ABNORMAL LOW (ref 8.9–10.3)
Chloride: 109 mmol/L (ref 98–111)
Creatinine, Ser: 0.7 mg/dL (ref 0.44–1.00)
GFR, Estimated: >60 mL/min (ref >60)
Glucose, Bld: 142 mg/dL — ABNORMAL HIGH (ref 70–99)
Potassium: 3.6 mmol/L (ref 3.5–5.1)
Sodium: 139 mmol/L (ref 135–145)
Total Bilirubin: 0.5 mg/dL (ref 0.3–1.2)
Total Protein: 6.7 g/dL (ref 6.5–8.1)

## 2021-02-01 LAB — CBC
HCT: 34.8 % — ABNORMAL LOW (ref 36.0–46.0)
Hemoglobin: 11.5 g/dL — ABNORMAL LOW (ref 12.0–15.0)
MCH: 29.1 pg (ref 26.0–34.0)
MCHC: 33 g/dL (ref 30.0–36.0)
MCV: 88.1 fL (ref 80.0–100.0)
Platelets: 176 10*3/uL (ref 150–400)
RBC: 3.95 MIL/uL (ref 3.87–5.11)
RDW: 15.4 % (ref 11.5–15.5)
WBC: 8.9 10*3/uL (ref 4.0–10.5)
nRBC: 0 % (ref 0.0–0.2)

## 2021-02-01 LAB — DIFFERENTIAL
Abs Immature Granulocytes: 0.03 10*3/uL (ref 0.00–0.07)
Basophils Absolute: 0 10*3/uL (ref 0.0–0.1)
Basophils Relative: 0 %
Eosinophils Absolute: 0.1 10*3/uL (ref 0.0–0.5)
Eosinophils Relative: 1 %
Immature Granulocytes: 0 %
Lymphocytes Relative: 32 %
Lymphs Abs: 2.8 10*3/uL (ref 0.7–4.0)
Monocytes Absolute: 0.7 10*3/uL (ref 0.1–1.0)
Monocytes Relative: 8 %
Neutro Abs: 5.3 10*3/uL (ref 1.7–7.7)
Neutrophils Relative %: 59 %

## 2021-02-01 LAB — APTT: aPTT: 30 seconds (ref 24–36)

## 2021-02-01 LAB — PROTIME-INR
INR: 1 (ref 0.8–1.2)
Prothrombin Time: 12.9 seconds (ref 11.4–15.2)

## 2021-02-01 MED ORDER — IOHEXOL 350 MG/ML SOLN
75.0000 mL | Freq: Once | INTRAVENOUS | Status: AC | PRN
  Administered 2021-02-01: 19:00:00 75 mL via INTRAVENOUS
  Filled 2021-02-01: qty 75

## 2021-02-01 MED ORDER — KETOROLAC TROMETHAMINE 30 MG/ML IJ SOLN
15.0000 mg | Freq: Once | INTRAMUSCULAR | Status: AC
  Administered 2021-02-01: 21:00:00 15 mg via INTRAVENOUS
  Filled 2021-02-01: qty 1

## 2021-02-01 MED ORDER — DIPHENHYDRAMINE HCL 50 MG/ML IJ SOLN
25.0000 mg | Freq: Once | INTRAMUSCULAR | Status: AC
  Administered 2021-02-01: 21:00:00 25 mg via INTRAVENOUS
  Filled 2021-02-01: qty 1

## 2021-02-01 MED ORDER — SODIUM CHLORIDE 0.9% FLUSH: 3.0000 mL | Freq: Once | INTRAVENOUS | Status: DC

## 2021-02-01 MED ORDER — PROMETHAZINE HCL 25 MG PO TABS
12.5000 mg | ORAL_TABLET | Freq: Once | ORAL | Status: AC
  Administered 2021-02-01: 21:00:00 12.5 mg via ORAL
  Filled 2021-02-01: qty 1

## 2021-02-01 MED ORDER — BUTALBITAL-APAP-CAFFEINE 50-325-40 MG PO TABS: 1.0000 | ORAL_TABLET | Freq: Four times a day (QID) | ORAL | 0 refills | Status: DC | PRN

## 2021-02-01 NOTE — ED Triage Notes (Signed)
Pt comes with c/o dizziness and headache that started two hours ago. Pt states she took medications with little relief.  Pt states headaches for over 3 weeks. Pt states no blurry vision. Pt states she just feels uneven.

## 2021-02-01 NOTE — ED Notes (Signed)
This RN called & updated Ria Comment in [place] about PIV access.

## 2021-02-01 NOTE — ED Provider Notes (Signed)
[facility] Emergency Department Provider Note  ____________________________________________  Time seen: Approximately 4:25 PM  I have reviewed the triage vital signs and the nursing notes.   HISTORY  Chief Complaint Dizziness    HPI Joanna Ortiz is a 75 y.o. female who presents the emergency department complaining of new persistent daily headache to the left temporal region x3 weeks.  Patient does have some mild vertigo from her ears, states that her dizziness has been significantly increased.  She has seen multiple providers for her headache including ENT, urgent care and ophthalmology.  Patient was placed on tramadol from urgent care for headaches.  Ophthalmologist states that there was no symptoms being caused by her eyes.  ENT advised that patient has what appears to be eustachian tube dysfunction which is a chronic issue.  No unilateral weakness.  No difficulty formulating thoughts or words.  The headache is worsening and patient is becoming more dizzy.  Patient presents to the ED for evaluation at this time.  No history of headache syndromes such as migraines.  Has a history of collagen vascular disease, coronary artery disease, Crohn's, diabetes, hypothyroidism.  It appears from the medical record, patient has had ESR and CRP from ophthalmology which is reassuring with no overt evidence of temporal arteritis.  Also on review of medical record, it appears that patient urgent care visit was primarily in regards to dental pain from her dentures, and diarrhea.  There does not appear to be any discussion revolving around the headache from her urgent care visit.       Past Medical History:  Diagnosis Date   Anemia    Arthritis    Collagen vascular disease ([place])    Coronary artery disease    Crohn disease ([place])    Diabetes mellitus without complication ([place])    diet controlled   Hyperlipidemia    Hypothyroidism    Pneumonia    3/18   Thyroid disease     goiter    Patient Active Problem List   Diagnosis Date Noted   Stable angina ([place]) 04/03/2018   Right groin pain 06/22/2016   Acute bronchitis 05/01/2016   Diastasis recti 10/29/2015   Rectal bleeding 06/10/2014   AP (abdominal pain) 12/29/2013   Crohn's disease ([place]) 12/18/2013    Past Surgical History:  Procedure Laterality Date   ABDOMINAL HYSTERECTOMY  1987   APPENDECTOMY     BREAST BIOPSY Bilateral    neg   CATARACT EXTRACTION W/PHACO Right 11/06/2016   Procedure: CATARACT EXTRACTION PHACO AND INTRAOCULAR LENS PLACEMENT ([place]);  Surgeon: Birder Robson, MD;  Location: [facility] ORS;  Service: Ophthalmology;  Laterality: Right;  [place] 00:42.6 AP% 16.7 CDE 7.14 Fluid Pack Lot # [id number]   CATARACT EXTRACTION W/PHACO Left 12/25/2016   Procedure: CATARACT EXTRACTION PHACO AND INTRAOCULAR LENS PLACEMENT (I[facility]);  Surgeon: Birder Robson, MD;  Location: [facility] ORS;  Service: Ophthalmology;  Laterality: Left;  [place] 00:32 AP% 13.7 CDE 4.40 Fluid pack lot # [id number]   COLON SURGERY  06/06/2011   Resection of terminal ileum and previous ileocolonic anastomosis for recurrent Crohn's disease. 3 focal strictures identified.   COLONOSCOPY  2013   JOINT REPLACEMENT Left    SHOULDER   LEFT HEART CATH AND CORONARY ANGIOGRAPHY Left 04/04/2018   Procedure: LEFT HEART CATH AND CORONARY ANGIOGRAPHY;  Surgeon: Corey Skains, MD;  Location: [facility];  Service: Cardiovascular;  Laterality: Left;   SHOULDER SURGERY Right 11/20/2018   Procedure: R SHOULDER ARTHROSCOPY: biceps tenomyotomy,  distal claviculectomy, acromioplasty, posterior capsule release, debridement, decrompression of subacromial space, coracoacromial release, bursectomy   SMALL INTESTINE SURGERY  2012   THYROID SURGERY  1992   TONSILLECTOMY      Prior to Admission medications   Medication Sig Start Date End Date Taking? Authorizing Provider  butalbital-acetaminophen-caffeine (FIORICET) 50-325-40 MG tablet Take 1 tablet by  mouth every 6 (six) hours as needed for headache. 02/01/21 02/01/22 Yes Palin Tristan, Charline Bills, PA-C  acetaminophen (TYLENOL) 500 MG tablet Take 1,000 mg by mouth every 8 (eight) hours as needed for mild pain or moderate pain.    [provider]  Adalimumab 40 MG/0.4ML PSKT Inject 40 mg into the skin every 14 (fourteen) days.  10/03/17   [provider]  albuterol (VENTOLIN HFA) 108 (90 Base) MCG/ACT inhaler Inhale 1-2 puffs into the lungs every 4 (four) hours as needed for wheezing or shortness of breath. 10/21/19   Melynda Ripple, MD  Ascorbic Acid (VITAMIN C) 1000 MG tablet Take 1,000 mg by mouth 3 (three) times a week.     [provider]  blood glucose meter kit and supplies KIT Dispense based on patient and insurance preference. Use up to four times daily as directed. 02/26/18   Coral Spikes, DO  cholestyramine Lucrezia Starch) 4 G packet Take 4 g by mouth 2 (two) times daily as needed (stomach problems).     [provider]  CINNAMON PO Take 1,200 mg by mouth daily.    [provider]  cyanocobalamin (,VITAMIN B-12,) 1000 MCG/ML injection Inject 1,000 mcg into the muscle every 30 (thirty) days.    [provider]  Insulin Glargine (LANTUS) 100 UNIT/ML Solostar Pen Inject 10 Units into the skin daily. 02/26/18   Coral Spikes, DO  Insulin Pen Needle 32G X 4 MM MISC Use as directed to administer insulin. 02/26/18   Thersa Salt G, DO  ipratropium (ATROVENT) 0.06 % nasal spray Place 2 sprays into both nostrils 4 (four) times daily. 06/27/20   Laurene Footman B, PA-C  lidocaine (XYLOCAINE) 2 % solution Use as directed 15 mLs in the mouth or throat every 3 (three) hours as needed for mouth pain. 01/28/21   Laurene Footman B, PA-C  magnesium oxide (MAG-OX) 400 MG tablet Take 400 mg by mouth daily.    [provider]  metFORMIN (GLUCOPHAGE) 500 MG tablet Take 1 tablet (500 mg total) by mouth 2 (two) times daily with a meal. 02/26/18 08/24/20  Thersa Salt G, DO   ondansetron (ZOFRAN-ODT) 4 MG disintegrating tablet Take 4 mg by mouth every 8 (eight) hours as needed for nausea or vomiting.  11/16/15   [provider]  phenazopyridine (PYRIDIUM) 200 MG tablet Take 1 tablet (200 mg total) by mouth 3 (three) times daily as needed for pain. 12/31/20   Melynda Ripple, MD  Spacer/Aero-Holding Chambers (AEROCHAMBER PLUS) inhaler Use as instructed 10/20/19   Melynda Ripple, MD  SYNTHROID 100 MCG tablet Take 100 mcg by mouth daily before breakfast.  04/27/14   [provider]  THERATEARS 0.25 % SOLN Place 1 drop into both eyes 3 (three) times daily as needed (dry/irritated eyes.).    [provider]  thyroid (ARMOUR) 30 MG tablet Take 30 mg by mouth daily before breakfast.     [provider]  traMADol (ULTRAM) 50 MG tablet Take 1 tablet (50 mg total) by mouth every 8 (eight) hours as needed for up to 5 days for severe pain. 01/28/21 02/02/21  Danton Clap,  PA-C  Turmeric 500 MG CAPS Take 500 mg by mouth daily.    [provider]  XIFAXAN 550 MG TABS tablet Take 550 mg by mouth 3 (three) times daily. 06/22/20   [provider]    Allergies Erythromycin, Hydromorphone, Cefuroxime, Ciprocinonide [fluocinolone], Codeine, Floxin [ofloxacin], Keflex [cephalexin], Levofloxacin, Sulfa antibiotics, and Ciprofloxacin  Family History  Problem Relation Age of Onset   Breast cancer Sister 72   Crohn's disease Mother    Heart attack Father 69    Social History Social History   Tobacco Use   Smoking status: Former    Packs/day: 1.00    Years: 12.00    Pack years: 12.00    Types: Cigarettes    Quit date: 10/19/1969    Years since quitting: 51.3   Smokeless tobacco: Never  Vaping Use   Vaping Use: Never used  Substance Use Topics   Alcohol use: No   Drug use: No     Review of Systems  Constitutional: No fever/chills Eyes: No visual changes. No discharge ENT: No upper respiratory  complaints. Cardiovascular: no chest pain. Respiratory: no cough. No SOB. Gastrointestinal: No abdominal pain.  No nausea, no vomiting.  No diarrhea.  No constipation. Musculoskeletal: Negative for musculoskeletal pain. Skin: Negative for rash, abrasions, lacerations, ecchymosis. Neurological: Left temporal headache, denies focal weakness or numbness.  10 System ROS otherwise negative.  ____________________________________________   PHYSICAL EXAM:  VITAL SIGNS: ED Triage Vitals  Enc Vitals Group     BP 02/01/21 1231 (!) 142/67     Pulse Rate 02/01/21 1231 73     Resp 02/01/21 1231 19     Temp 02/01/21 1231 98.3 F (36.8 C)     Temp Source 02/01/21 1231 Oral     SpO2 02/01/21 1231 95 %     Weight --      Height --      Head Circumference --      Peak Flow --      Pain Score 02/01/21 1229 8     Pain Loc --      Pain Edu? --      Excl. in [place]? --      Constitutional: Alert and oriented. Well appearing and in no acute distress. Eyes: Conjunctivae are normal. PERRL. EOMI. Head: Atraumatic. ENT:      Ears:       Nose: No congestion/rhinnorhea.      Mouth/Throat: Mucous membranes are moist.  Neck: No stridor.  Neck is supple full range of motion.  No tenderness.  No bruits on auscultation. Hematological/Lymphatic/Immunilogical: No cervical lymphadenopathy. Cardiovascular: Normal rate, regular rhythm. Normal S1 and S2.  Good peripheral circulation. Respiratory: Normal respiratory effort without tachypnea or retractions. Lungs CTAB. Good air entry to the bases with no decreased or absent breath sounds. Musculoskeletal: Full range of motion to all extremities. No gross deformities appreciated. Neurologic:  Normal speech and language. No gross focal neurologic deficits are appreciated.  Cranial nerves II through XII grossly intact.  Negative Romberg's and pronator drift.  Equal grip strength in the upper extremities. Skin:  Skin is warm, dry and intact. No rash  noted. Psychiatric: Mood and affect are normal. Speech and behavior are normal. Patient exhibits appropriate insight and judgement.   ____________________________________________   LABS (all labs ordered are listed, but only abnormal results are displayed)  Labs Reviewed  CBC - Abnormal; Notable for the following components:      Result Value   Hemoglobin 11.5 (*)  HCT 34.8 (*)    All other components within normal limits  COMPREHENSIVE METABOLIC PANEL - Abnormal; Notable for the following components:   Glucose, Bld 142 (*)    Calcium 8.3 (*)    Albumin 3.3 (*)    All other components within normal limits  PROTIME-INR  APTT  DIFFERENTIAL  CBG MONITORING, ED  I-STAT CREATININE, ED   ____________________________________________  EKG   ____________________________________________  RADIOLOGY I personally viewed and evaluated these images as part of my medical decision making, as well as reviewing the written report by the radiologist.  ED Provider Interpretation: CT head without revealed no acute findings.  CT angio head and neck reveal CT angio revealed no concerning findings in the head or neck  CT ANGIO HEAD NECK W WO CM  Result Date: 02/01/2021 CLINICAL DATA:  Dizziness, persistent or recurrent, daily headaches, left temporal pain EXAM: CT ANGIOGRAPHY HEAD AND NECK TECHNIQUE: Multidetector CT imaging of the head and neck was performed using the standard protocol during bolus administration of intravenous contrast. Multiplanar CT image reconstructions and MIPs were obtained to evaluate the vascular anatomy. Carotid stenosis measurements (when applicable) are obtained utilizing NASCET criteria, using the distal internal carotid diameter as the denominator. CONTRAST:  19mL OMNIPAQUE IOHEXOL 350 MG/ML SOLN COMPARISON:  No prior CTA, correlation is made with CT head 02/01/2021 and CT 12/10/2013 FINDINGS: CT HEAD FINDINGS For noncontrast findings, please see same day CT head. CTA  NECK FINDINGS Aortic arch: Standard branching. Imaged portion shows no evidence of aneurysm or dissection. No significant stenosis of the major arch vessel origins. Right carotid system: No evidence of dissection, stenosis (50% or greater) or occlusion. Calcifications at the bifurcation and in the proximal right ICA are not significant. Left carotid system: No evidence of dissection, stenosis (50% or greater) or occlusion. Calcifications at the bifurcation and in the proximal left ICA are not significant. Vertebral arteries: No evidence of dissection, stenosis (50% or greater) or occlusion. Skeleton: No acute osseous abnormality. Other neck: Negative. Upper chest: Negative. Review of the MIP images confirms the above findings CTA HEAD FINDINGS Anterior circulation: Both internal carotid arteries are patent to the termini, without stenosis or other abnormality. Hypoplastic right A1. Normal left A1. Normal anterior communicating artery. Anterior cerebral arteries are patent to their distal aspects. No M1 stenosis or occlusion. Normal MCA bifurcations. Distal MCA branches perfused and symmetric. Posterior circulation: Vertebral arteries patent to the vertebrobasilar junction without stenosis. Posterior inferior cerebral arteries patent bilaterally. Basilar patent to its distal aspect. Superior cerebellar arteries patent bilaterally. PCAs perfused to their distal aspects without stenosis. The right posterior communicating artery is patent. The left posterior communicating artery is not visualized. Venous sinuses: As permitted by contrast timing, patent. Anatomic variants: None significant Review of the MIP images confirms the above findings IMPRESSION: 1. No intracranial large vessel occlusion or significant stenosis. 2. No hemodynamically significant stenosis in the neck. Electronically Signed   By: Merilyn Baba M.D.   On: 02/01/2021 19:18   CT HEAD WO CONTRAST  Result Date: 02/01/2021 CLINICAL DATA:  Provided  history: Dizziness/headache. EXAM: CT HEAD WITHOUT CONTRAST TECHNIQUE: Contiguous axial images were obtained from the base of the skull through the vertex without intravenous contrast. COMPARISON:  Prior head CT examinations 03/12/2016 and earlier. Brain MRI 09/04/2014. FINDINGS: Brain: Mild generalized cerebral and cerebellar atrophy. Minimal chronic small vessel ischemic changes within the cerebral white matter were better appreciated on the prior brain MRI of 09/04/2014. Partially empty sella turcica. There is no  acute intracranial hemorrhage. No demarcated cortical infarct. No extra-axial fluid collection. No evidence of an intracranial mass. No midline shift. Vascular: No hyperdense vessel. Atherosclerotic calcifications. Skull: Normal. Negative for fracture or focal lesion. Sinuses/Orbits: Visualized orbits show no acute finding. No significant paranasal sinus disease at the imaged levels. IMPRESSION: No evidence of acute intracranial abnormality. Minimal chronic small-vessel ischemic changes within the cerebral white matter. Mild generalized parenchymal atrophy. Electronically Signed   By: Kellie Simmering D.O.   On: 02/01/2021 12:59    ____________________________________________    PROCEDURES  Procedure(s) performed:    Procedures    Medications  sodium chloride flush (NS) 0.9 % injection 3 mL (3 mLs Intravenous Not Given 02/01/21 1901)  iohexol (OMNIPAQUE) 350 MG/ML injection 75 mL (75 mLs Intravenous Contrast Given 02/01/21 1855)  ketorolac (TORADOL) 30 MG/ML injection 15 mg (15 mg Intravenous Given 02/01/21 2051)  promethazine (PHENERGAN) tablet 12.5 mg (12.5 mg Oral Given 02/01/21 2050)  diphenhydrAMINE (BENADRYL) injection 25 mg (25 mg Intravenous Given 02/01/21 2052)     ____________________________________________   INITIAL IMPRESSION / ASSESSMENT AND PLAN / ED COURSE  Pertinent labs & imaging results that were available during my care of the patient were reviewed by me and  considered in my medical decision making (see chart for details).  Review of the  [place]RS was performed in accordance of the [place] prior to dispensing any controlled drugs.           Patient's diagnosis is consistent with intractable headache.  Patient presents emergency department with a headache x3 weeks with dizziness.  She does have a known history of vertigo and sees ENT for same.  With a headache she saw her primary care, reportedly urgent care and her ophthalmologist.  Given the location of headache, she has had labs to rule out temporal arteritis and her ESR and CRP do not meet criteria for temporal arteritis.  It appears that urgent care visit was actually in regards to other condition other than her headache.  At this time patient had labs, imaging which is reassuring.  Patient had negative CT scan without contrast but given the dizziness plus the new persistent headache I felt that imaging with angio was warranted.  This revealed no acute findings and patient will be treated symptomatically with medications.  She is agreeable with this plan.  Follow-up with neurology if she continues to have these persistent headaches.  Concerning signs and symptoms are discussed with the patient and her husband..  Patient is given ED precautions to return to the ED for any worsening or new symptoms.     ____________________________________________  FINAL CLINICAL IMPRESSION(S) / ED DIAGNOSES  Final diagnoses:  Acute intractable headache, unspecified headache type      NEW MEDICATIONS STARTED DURING THIS VISIT:  ED Discharge Orders          Ordered    butalbital-acetaminophen-caffeine (FIORICET) 50-325-40 MG tablet  Every 6 hours PRN        02/01/21 2012                This chart was dictated using voice recognition software/Dragon. Despite best efforts to proofread, errors can occur which can change the meaning. Any change was purely unintentional.    Darletta Moll,  PA-C 02/01/21 2336    Vladimir Crofts, MD 02/01/21 2337

## 2021-02-28 DIAGNOSIS — I6523 Occlusion and stenosis of bilateral carotid arteries: Secondary | ICD-10-CM | POA: Insufficient documentation

## 2021-04-12 ENCOUNTER — Other Ambulatory Visit
Admission: RE | Admit: 2021-04-12 | Discharge: 2021-04-12 | Disposition: A | Discharge status: Home / Self Care | Payer: Federal, State, Local not specified - PPO | Attending: Otolaryngology | Admitting: Otolaryngology

## 2021-04-12 DIAGNOSIS — E890 Postprocedural hypothyroidism: Secondary | ICD-10-CM | POA: Insufficient documentation

## 2021-04-12 LAB — TSH: TSH: 7.364 u[IU]/mL — ABNORMAL HIGH (ref 0.350–4.500)

## 2021-04-13 LAB — T3: T3, Total: 131 ng/dL (ref 71–180)

## 2021-04-13 LAB — T4: T4, Total: 6 ug/dL (ref 4.5–12.0)

## 2021-04-26 DIAGNOSIS — M0600 Rheumatoid arthritis without rheumatoid factor, unspecified site: Secondary | ICD-10-CM | POA: Insufficient documentation

## 2021-07-27 ENCOUNTER — Encounter: Payer: Self-pay | Admitting: *Deleted

## 2021-07-28 ENCOUNTER — Encounter: Payer: Self-pay | Admitting: *Deleted

## 2021-07-28 ENCOUNTER — Encounter
Admission: RE | Disposition: A | Discharge status: Home / Self Care | Payer: Self-pay | Source: Home / Self Care | Attending: *Deleted

## 2021-07-28 ENCOUNTER — Ambulatory Visit
Admission: RE | Admit: 2021-07-28 | Discharge: 2021-07-28 | Disposition: A | Discharge status: Home / Self Care | Payer: Federal, State, Local not specified - PPO | Attending: Gastroenterology | Admitting: Gastroenterology

## 2021-07-28 ENCOUNTER — Ambulatory Visit: Payer: Federal, State, Local not specified - PPO | Admitting: Anesthesiology

## 2021-07-28 DIAGNOSIS — Z980 Intestinal bypass and anastomosis status: Secondary | ICD-10-CM | POA: Diagnosis not present

## 2021-07-28 DIAGNOSIS — E039 Hypothyroidism, unspecified: Secondary | ICD-10-CM | POA: Insufficient documentation

## 2021-07-28 DIAGNOSIS — Z794 Long term (current) use of insulin: Secondary | ICD-10-CM | POA: Insufficient documentation

## 2021-07-28 DIAGNOSIS — E119 Type 2 diabetes mellitus without complications: Secondary | ICD-10-CM | POA: Insufficient documentation

## 2021-07-28 DIAGNOSIS — E669 Obesity, unspecified: Secondary | ICD-10-CM | POA: Insufficient documentation

## 2021-07-28 DIAGNOSIS — E785 Hyperlipidemia, unspecified: Secondary | ICD-10-CM | POA: Insufficient documentation

## 2021-07-28 DIAGNOSIS — I444 Left anterior fascicular block: Secondary | ICD-10-CM | POA: Diagnosis not present

## 2021-07-28 DIAGNOSIS — K5730 Diverticulosis of large intestine without perforation or abscess without bleeding: Secondary | ICD-10-CM | POA: Diagnosis not present

## 2021-07-28 DIAGNOSIS — I6523 Occlusion and stenosis of bilateral carotid arteries: Secondary | ICD-10-CM | POA: Insufficient documentation

## 2021-07-28 DIAGNOSIS — D122 Benign neoplasm of ascending colon: Secondary | ICD-10-CM | POA: Diagnosis not present

## 2021-07-28 DIAGNOSIS — K640 First degree hemorrhoids: Secondary | ICD-10-CM | POA: Insufficient documentation

## 2021-07-28 DIAGNOSIS — I2510 Atherosclerotic heart disease of native coronary artery without angina pectoris: Secondary | ICD-10-CM | POA: Diagnosis not present

## 2021-07-28 DIAGNOSIS — M069 Rheumatoid arthritis, unspecified: Secondary | ICD-10-CM | POA: Insufficient documentation

## 2021-07-28 DIAGNOSIS — R197 Diarrhea, unspecified: Secondary | ICD-10-CM | POA: Diagnosis not present

## 2021-07-28 DIAGNOSIS — K5000 Crohn's disease of small intestine without complications: Secondary | ICD-10-CM | POA: Insufficient documentation

## 2021-07-28 DIAGNOSIS — Z87891 Personal history of nicotine dependence: Secondary | ICD-10-CM | POA: Insufficient documentation

## 2021-07-28 DIAGNOSIS — K633 Ulcer of intestine: Secondary | ICD-10-CM | POA: Diagnosis not present

## 2021-07-28 DIAGNOSIS — Z6825 Body mass index (BMI) 25.0-25.9, adult: Secondary | ICD-10-CM | POA: Insufficient documentation

## 2021-07-28 DIAGNOSIS — K219 Gastro-esophageal reflux disease without esophagitis: Secondary | ICD-10-CM | POA: Insufficient documentation

## 2021-07-28 HISTORY — PX: COLONOSCOPY WITH PROPOFOL: SHX5780

## 2021-07-28 HISTORY — DX: Gastro-esophageal reflux disease without esophagitis: K21.9

## 2021-07-28 HISTORY — DX: Ankylosing spondylitis of unspecified sites in spine: M45.9

## 2021-07-28 HISTORY — DX: Other specified postprocedural states: Z98.890

## 2021-07-28 HISTORY — DX: Rheumatoid arthritis, unspecified: M06.9

## 2021-07-28 HISTORY — DX: Occlusion and stenosis of bilateral carotid arteries: I65.23

## 2021-07-28 HISTORY — DX: Nausea with vomiting, unspecified: R11.2

## 2021-07-28 SURGERY — COLONOSCOPY WITH PROPOFOL
Anesthesia: General

## 2021-07-28 MED ORDER — SODIUM CHLORIDE 0.9 % IV SOLN
INTRAVENOUS | Status: DC
Start: 2021-07-28 — End: 2021-07-28

## 2021-07-28 MED ORDER — PROPOFOL 500 MG/50ML IV EMUL
INTRAVENOUS | Status: DC | PRN
  Administered 2021-07-28: 100 ug/kg/min via INTRAVENOUS

## 2021-07-28 MED ORDER — PROPOFOL 10 MG/ML IV BOLUS
INTRAVENOUS | Status: DC | PRN
  Administered 2021-07-28: 40 mg via INTRAVENOUS
  Administered 2021-07-28 (×2): 30 mg via INTRAVENOUS

## 2021-07-28 MED ORDER — PROPOFOL 1000 MG/100ML IV EMUL
INTRAVENOUS | Status: AC
  Filled 2021-07-28: qty 100

## 2021-07-28 NOTE — Anesthesia Postprocedure Evaluation (Signed)
Anesthesia Post Note  Patient: Joanna Ortiz  Procedure(s) Performed: COLONOSCOPY WITH PROPOFOL  Patient location during evaluation: Endoscopy Anesthesia Type: General Level of consciousness: awake and alert Pain management: pain level controlled Vital Signs Assessment: post-procedure vital signs reviewed and stable Respiratory status: spontaneous breathing, nonlabored ventilation and respiratory function stable Cardiovascular status: blood pressure returned to baseline and stable Postop Assessment: no apparent nausea or vomiting Anesthetic complications: no   No notable events documented.   Last Vitals:  Vitals:   07/28/21 0833 07/28/21 0843  BP: 136/71 (!) 155/80  Pulse: 68 65  Resp: 15 14  Temp:    SpO2: 100% 100%    Last Pain:  Vitals:   07/28/21 0843  TempSrc:   PainSc: 0-No pain                 [place] Ouch

## 2021-07-28 NOTE — Op Note (Signed)
[facility] Patient Name: Joanna Ortiz Procedure Date: 07/28/2021 7:46 AM MRN: [id number] Account #: [id number] Date of Birth: 07/29/1945 Admit Type: Outpatient Age: 76 Room: [facility] ENDO ROOM 3 Gender: Female Note Status: Finalized Instrument Name: Jasper Riling [id number] Procedure:             Colonoscopy Indications:           Disease activity assessment of Crohn's disease of the                         small bowel Providers:             Andrey Farmer MD, MD Medicines:             Monitored Anesthesia Care Complications:         No immediate complications. Estimated blood loss:                         Minimal. Procedure:             Pre-Anesthesia Assessment:                        - Prior to the procedure, a History and Physical was                         performed, and patient medications and allergies were                         reviewed. The patient is competent. The risks and                         benefits of the procedure and the sedation options and                         risks were discussed with the patient. All questions                         were answered and informed consent was obtained.                         Patient identification and proposed procedure were                         verified by the physician, the nurse, the                         anesthesiologist, the anesthetist and the technician                         in the endoscopy suite. Mental Status Examination:                         alert and oriented. Airway Examination: normal                         oropharyngeal airway and neck mobility. Respiratory                         Examination: clear to auscultation. CV Examination:  normal. Prophylactic Antibiotics: The patient does not                         require prophylactic antibiotics. Prior                         Anticoagulants: The patient has taken no previous                          anticoagulant or antiplatelet agents. ASA Grade                         Assessment: III - A patient with severe systemic                         disease. After reviewing the risks and benefits, the                         patient was deemed in satisfactory condition to                         undergo the procedure. The anesthesia plan was to use                         monitored anesthesia care (MAC). Immediately prior to                         administration of medications, the patient was                         re-assessed for adequacy to receive sedatives. The                         heart rate, respiratory rate, oxygen saturations,                         blood pressure, adequacy of pulmonary ventilation, and                         response to care were monitored throughout the                         procedure. The physical status of the patient was                         re-assessed after the procedure.                        After obtaining informed consent, the colonoscope was                         passed under direct vision. Throughout the procedure,                         the patient's blood pressure, pulse, and oxygen                         saturations were monitored continuously. The  Colonoscope was introduced through the anus and                         advanced to the the ileocolonic anastomosis. The                         colonoscopy was performed without difficulty. The                         patient tolerated the procedure well. The quality of                         the bowel preparation was good. Findings:      The perianal and digital rectal examinations were normal.      There was evidence of a prior end-to-side ileo-colonic anastomosis in       the ascending colon. This was patent and was characterized by       ulceration. Biopsies were taken with a cold forceps for histology.       Estimated blood loss was minimal.      Very  minimal inflammation characterized by one erosion was found as one       patch surrounded by normal mucosa in the terminal ileum. The entire       colon was spared. This was graded as Rutgeerts Score i1 (five or fewer       aphthous lesions). Biopsies were taken with a cold forceps for       histology. Estimated blood loss was minimal.      A less than 1 mm polyp was found in the ascending colon. The polyp was       sessile. The polyp was removed with a cold biopsy forceps. Resection and       retrieval were complete. Estimated blood loss was minimal.      Normal mucosa was found in the entire colon. Biopsies for histology were       taken with a cold forceps from the entire colon for evaluation of       microscopic colitis. Estimated blood loss was minimal.      A few small-mouthed diverticula were found in the sigmoid colon and       descending colon.      Internal hemorrhoids were found during retroflexion. The hemorrhoids       were Grade I (internal hemorrhoids that do not prolapse).      The exam was otherwise without abnormality on direct and retroflexion       views. Impression:            - Patent end-to-side ileo-colonic anastomosis,                         characterized by ulceration. Biopsied.                        - Inflammation was found in the terminal ileum. This                         was graded as Rutgeerts Score i1 (five or fewer                         aphthous lesions). Biopsied.                        -  One less than 1 mm polyp in the ascending colon,                         removed with a cold biopsy forceps. Resected and                         retrieved.                        - Normal mucosa in the entire examined colon. Biopsied.                        - Diverticulosis in the sigmoid colon and in the                         descending colon.                        - Internal hemorrhoids.                        - The examination was otherwise normal on direct  and                         retroflexion views. Recommendation:        - Discharge patient to home.                        - Resume previous diet.                        - Continue present medications.                        - Await pathology results.                        - Repeat colonoscopy for surveillance based on                         pathology results.                        - Return to referring physician as previously                         scheduled. Procedure Code(s):     --- Professional ---                        [phone], Colonoscopy, flexible; with biopsy, single or                         multiple Diagnosis Code(s):     --- Professional ---                        Z98.0, Intestinal bypass and anastomosis status                        K52.9, Noninfective gastroenteritis and colitis,  unspecified                        K63.5, Polyp of colon                        K64.0, First degree hemorrhoids                        K50.00, Crohn's disease of small intestine without                         complications                        K57.30, Diverticulosis of large intestine without                         perforation or abscess without bleeding CPT copyright 2019 American Medical Association. All rights reserved. The codes documented in this report are preliminary and upon coder review may  be revised to meet current compliance requirements. Andrey Farmer MD, MD 07/28/2021 8:29:41 AM Number of Addenda: 0 Note Initiated On: 07/28/2021 7:46 AM Scope Withdrawal Time: 0 hours 11 minutes 49 seconds  Total Procedure Duration: 0 hours 16 minutes 5 seconds  Estimated Blood Loss:  Estimated blood loss was minimal.      [facility]

## 2021-07-28 NOTE — Interval H&P Note (Signed)
History and Physical Interval Note:  07/28/2021 7:45 AM  Joanna Ortiz  has presented today for surgery, with the diagnosis of Crohns disease.  The various methods of treatment have been discussed with the patient and family. After consideration of risks, benefits and other options for treatment, the patient has consented to  Procedure(s) with comments: COLONOSCOPY WITH PROPOFOL (N/A) - IDDM as a surgical intervention.  The patient's history has been reviewed, patient examined, no change in status, stable for surgery.  I have reviewed the patient's chart and labs.  Questions were answered to the patient's satisfaction.     Lesly Rubenstein  Ok to proceed with colonoscopy

## 2021-07-28 NOTE — Anesthesia Preprocedure Evaluation (Addendum)
Anesthesia Evaluation  Patient identified by MRN, date of birth, ID band Patient awake    Reviewed: Allergy & Precautions, NPO status , Patient's Chart, lab work & pertinent test results  History of Anesthesia Complications (+) PONV and history of anesthetic complications  Airway Mallampati: III  TM Distance: >3 FB Neck ROM: full    Dental  (+) Edentulous Lower, Upper Dentures   Pulmonary neg pulmonary ROS, former smoker,    Pulmonary exam normal        Cardiovascular Exercise Tolerance: Poor + CAD   + Peripheral Edema dizziness  Bilateral carotid artery stenosis  Collagen vascular disease   LEFT ANTERIOR FASCICULAR BLOCK    Neuro/Psych  Neuromuscular disease negative psych ROS   GI/Hepatic Neg liver ROS, GERD  Controlled,Crohn disease   Endo/Other  diabetes, Well Controlled, Type 2, Insulin DependentHypothyroidism   Renal/GU negative Renal ROS  negative genitourinary   Musculoskeletal  (+) Arthritis , Rheumatoid disorders,  Ankylosing spondylitis   Abdominal (+) + obese,   Peds  Hematology negative hematology ROS (+)   Anesthesia Other Findings Past Medical History: No date: Anemia No date: Ankylosing spondylitis (HCC) No date: Arthritis No date: Bilateral carotid artery stenosis No date: Collagen vascular disease (HCC) No date: Coronary artery disease No date: Crohn disease ([place]) No date: Diabetes mellitus without complication (HCC)     Comment:  diet controlled No date: GERD (gastroesophageal reflux disease) No date: Hyperlipidemia No date: Hypothyroidism No date: Pneumonia     Comment:  3/18 No date: PONV (postoperative nausea and vomiting) No date: Rheumatoid arthritis ([place]) No date: Thyroid disease     Comment:  goiter  Past Surgical History: 1987: ABDOMINAL HYSTERECTOMY No date: APPENDECTOMY No date: BREAST BIOPSY; Bilateral     Comment:  neg 11/06/2016: CATARACT EXTRACTION W/PHACO;  Right     Comment:  Procedure: CATARACT EXTRACTION PHACO AND INTRAOCULAR               LENS PLACEMENT (IOC);  Surgeon: Birder Robson, MD;                Location: [facility] ORS;  Service: Ophthalmology;  Laterality:              Right;  [place] 00:42.6 AP% 16.7 CDE 7.14 Fluid Pack Lot #               [id number] 12/25/2016: CATARACT EXTRACTION W/PHACO; Left     Comment:  Procedure: CATARACT EXTRACTION PHACO AND INTRAOCULAR               LENS PLACEMENT (IOC);  Surgeon: Birder Robson, MD;                Location: [facility] ORS;  Service: Ophthalmology;  Laterality:              Left;  [place] 00:32 AP% 13.7 CDE 4.40 Fluid pack lot #               [id number] 06/06/2011: COLON SURGERY     Comment:  Resection of terminal ileum and previous ileocolonic               anastomosis for recurrent Crohn's disease. 3 focal               strictures identified. 2013: COLONOSCOPY No date: JOINT REPLACEMENT; Left     Comment:  SHOULDER 04/04/2018: LEFT HEART CATH AND CORONARY ANGIOGRAPHY; Left     Comment:  Procedure: LEFT HEART CATH AND CORONARY ANGIOGRAPHY;  Surgeon: Corey Skains, MD;  Location: [place]               CV LAB;  Service: Cardiovascular;  Laterality: Left; 11/20/2018: SHOULDER SURGERY; Right     Comment:  Procedure: R SHOULDER ARTHROSCOPY: biceps tenomyotomy,               distal claviculectomy, acromioplasty, posterior capsule               release, debridement, decrompression of subacromial               space, coracoacromial release, bursectomy 2012: SMALL INTESTINE SURGERY 1992: THYROID SURGERY No date: TONSILLECTOMY  BMI    Body Mass Index: 25.85 kg/m      Reproductive/Obstetrics negative OB ROS                            Anesthesia Physical Anesthesia Plan  ASA: 3  Anesthesia Plan: General   Post-op Pain Management:    Induction: Intravenous  PONV Risk Score and Plan: 4 or greater and Propofol infusion and TIVA  Airway  Management Planned: Natural Airway  Additional Equipment:   Intra-op Plan:   Post-operative Plan:   Informed Consent: I have reviewed the patients History and Physical, chart, labs and discussed the procedure including the risks, benefits and alternatives for the proposed anesthesia with the patient or authorized representative who has indicated his/her understanding and acceptance.     Dental Advisory Given  Plan Discussed with: Anesthesiologist, CRNA and Surgeon  Anesthesia Plan Comments:         Anesthesia Quick Evaluation

## 2021-07-28 NOTE — H&P (Signed)
Outpatient short stay form Pre-procedure 07/28/2021  Lesly Rubenstein, MD  Primary Physician: Sharyne Peach, MD  Reason for visit:  Crohns disease assessment   History of present illness:    76 y/o lady with history of crohns disease with ileocectomy on remicade here for disease assessment. Has been having severe diarrhea. No blood thinners. No family history of GI malignancies. CTE in September was unremarkable.    Current Facility-Administered Medications:    0.9 %  sodium chloride infusion, , Intravenous, Continuous, Ronin Rehfeldt, Hilton Cork, MD, Last Rate: 20 mL/hr at 07/28/21 0716, New Bag at 07/28/21 0716  Medications Prior to Admission  Medication Sig Dispense Refill Last Dose   albuterol (VENTOLIN HFA) 108 (90 Base) MCG/ACT inhaler Inhale 1-2 puffs into the lungs every 4 (four) hours as needed for wheezing or shortness of breath. 1 each 0 Past Month   Ascorbic Acid (VITAMIN C) 1000 MG tablet Take 1,000 mg by mouth 3 (three) times a week.    Past Week   aspirin EC 81 MG tablet Take 81 mg by mouth daily. Swallow whole.   Past Month   butalbital-acetaminophen-caffeine (FIORICET) 50-325-40 MG tablet Take 1 tablet by mouth every 6 (six) hours as needed for headache. 20 tablet 0 Past Month   cholestyramine (QUESTRAN) 4 G packet Take 4 g by mouth 2 (two) times daily as needed (stomach problems).   12 Past Week   CINNAMON PO Take 1,200 mg by mouth daily.   Past Week   cyanocobalamin (,VITAMIN B-12,) 1000 MCG/ML injection Inject 1,000 mcg into the muscle every 30 (thirty) days.   Past Week   loratadine (CLARITIN) 10 MG tablet Take 10 mg by mouth daily.   07/27/2021   magnesium oxide (MAG-OX) 400 MG tablet Take 400 mg by mouth daily.   Past Week   SYNTHROID 100 MCG tablet Take 100 mcg by mouth daily before breakfast.   11 07/28/2021   thyroid (ARMOUR) 30 MG tablet Take 30 mg by mouth daily before breakfast.    07/28/2021   Turmeric 500 MG CAPS Take 500 mg by mouth daily.   Past Week   XIFAXAN  550 MG TABS tablet Take 550 mg by mouth 3 (three) times daily.   07/27/2021   acetaminophen (TYLENOL) 500 MG tablet Take 1,000 mg by mouth every 8 (eight) hours as needed for mild pain or moderate pain.      Adalimumab 40 MG/0.4ML PSKT Inject 40 mg into the skin every 14 (fourteen) days.       blood glucose meter kit and supplies KIT Dispense based on patient and insurance preference. Use up to four times daily as directed. 1 each 0    Insulin Glargine (LANTUS) 100 UNIT/ML Solostar Pen Inject 10 Units into the skin daily. 15 mL 1    Insulin Pen Needle 32G X 4 MM MISC Use as directed to administer insulin. 100 each 1    ipratropium (ATROVENT) 0.06 % nasal spray Place 2 sprays into both nostrils 4 (four) times daily. 15 mL 12    lidocaine (XYLOCAINE) 2 % solution Use as directed 15 mLs in the mouth or throat every 3 (three) hours as needed for mouth pain. 100 mL 0    metFORMIN (GLUCOPHAGE) 500 MG tablet Take 1 tablet (500 mg total) by mouth 2 (two) times daily with a meal. 60 tablet 1    ondansetron (ZOFRAN-ODT) 4 MG disintegrating tablet Take 4 mg by mouth every 8 (eight) hours as needed for nausea or  vomiting.       phenazopyridine (PYRIDIUM) 200 MG tablet Take 1 tablet (200 mg total) by mouth 3 (three) times daily as needed for pain. 6 tablet 0    Spacer/Aero-Holding Chambers (AEROCHAMBER PLUS) inhaler Use as instructed 1 each 2    THERATEARS 0.25 % SOLN Place 1 drop into both eyes 3 (three) times daily as needed (dry/irritated eyes.).        Allergies  Allergen Reactions   Erythromycin Hives   Hydromorphone Hives   Cefuroxime Nausea And Vomiting   Ciprocinonide [Fluocinolone]     Muscle pain    Codeine     Other reaction(s): Vomiting   Floxin [Ofloxacin] Nausea And Vomiting   Keflex [Cephalexin] Nausea And Vomiting   Levofloxacin Nausea And Vomiting   Sulfa Antibiotics Nausea And Vomiting   Ciprofloxacin Nausea And Vomiting     Past Medical History:  Diagnosis Date   Anemia     Ankylosing spondylitis (HCC)    Arthritis    Bilateral carotid artery stenosis    Collagen vascular disease (HCC)    Coronary artery disease    Crohn disease ([place])    Diabetes mellitus without complication ([place])    diet controlled   GERD (gastroesophageal reflux disease)    Hyperlipidemia    Hypothyroidism    Pneumonia    3/18   PONV (postoperative nausea and vomiting)    Rheumatoid arthritis (HCC)    Thyroid disease    goiter    Review of systems:  Otherwise negative.    Physical Exam  Gen: Alert, oriented. Appears stated age.  HEENT: PERRLA. Lungs: No respiratory distress CV: RRR Abd: soft, benign, no masses Ext: No edema    Planned procedures: Proceed with colonoscopy. The patient understands the nature of the planned procedure, indications, risks, alternatives and potential complications including but not limited to bleeding, infection, perforation, damage to internal organs and possible oversedation/side effects from anesthesia. The patient agrees and gives consent to proceed.  Please refer to procedure notes for findings, recommendations and patient disposition/instructions.     Lesly Rubenstein, MD [facility]enter[facility]

## 2021-07-28 NOTE — Transfer of Care (Signed)
Immediate Anesthesia Transfer of Care Note  Patient: XITLALIC MASLIN  Procedure(s) Performed: COLONOSCOPY WITH PROPOFOL  Patient Location: PACU  Anesthesia Type:General  Level of Consciousness: drowsy  Airway & Oxygen Therapy: Patient Spontanous Breathing and Patient connected to nasal cannula oxygen  Post-op Assessment: Report given to RN, Post -op Vital signs reviewed and stable and Patient moving all extremities  Post vital signs: Reviewed and stable  Last Vitals:  Vitals Value Taken Time  BP 105/62 07/28/21 0812  Temp    Pulse 73 07/28/21 0813  Resp 19 07/28/21 0813  SpO2 98 % 07/28/21 0813  Vitals shown include unvalidated device data.  Last Pain:  Vitals:   07/28/21 0659  TempSrc: Temporal  PainSc: 0-No pain         Complications: No notable events documented.

## 2021-07-31 ENCOUNTER — Encounter: Payer: Self-pay | Admitting: Gastroenterology

## 2021-07-31 LAB — SURGICAL PATHOLOGY

## 2021-08-18 ENCOUNTER — Observation Stay
Admission: EM | Admit: 2021-08-18 | Discharge: 2021-08-20 | Disposition: A | Discharge status: Home / Self Care | Payer: Federal, State, Local not specified - PPO | Attending: Internal Medicine | Admitting: Internal Medicine

## 2021-08-18 ENCOUNTER — Emergency Department: Payer: Federal, State, Local not specified - PPO

## 2021-08-18 ENCOUNTER — Other Ambulatory Visit: Payer: Self-pay

## 2021-08-18 ENCOUNTER — Observation Stay: Payer: Federal, State, Local not specified - PPO

## 2021-08-18 DIAGNOSIS — Z79899 Other long term (current) drug therapy: Secondary | ICD-10-CM | POA: Diagnosis not present

## 2021-08-18 DIAGNOSIS — E039 Hypothyroidism, unspecified: Secondary | ICD-10-CM | POA: Insufficient documentation

## 2021-08-18 DIAGNOSIS — K5090 Crohn's disease, unspecified, without complications: Secondary | ICD-10-CM | POA: Diagnosis present

## 2021-08-18 DIAGNOSIS — I10 Essential (primary) hypertension: Secondary | ICD-10-CM | POA: Diagnosis not present

## 2021-08-18 DIAGNOSIS — E119 Type 2 diabetes mellitus without complications: Secondary | ICD-10-CM

## 2021-08-18 DIAGNOSIS — Z7982 Long term (current) use of aspirin: Secondary | ICD-10-CM | POA: Diagnosis not present

## 2021-08-18 DIAGNOSIS — I2510 Atherosclerotic heart disease of native coronary artery without angina pectoris: Secondary | ICD-10-CM | POA: Insufficient documentation

## 2021-08-18 DIAGNOSIS — E876 Hypokalemia: Secondary | ICD-10-CM | POA: Diagnosis not present

## 2021-08-18 DIAGNOSIS — Z794 Long term (current) use of insulin: Secondary | ICD-10-CM | POA: Diagnosis not present

## 2021-08-18 DIAGNOSIS — R471 Dysarthria and anarthria: Secondary | ICD-10-CM | POA: Diagnosis not present

## 2021-08-18 DIAGNOSIS — R519 Headache, unspecified: Secondary | ICD-10-CM | POA: Diagnosis not present

## 2021-08-18 DIAGNOSIS — Z87891 Personal history of nicotine dependence: Secondary | ICD-10-CM | POA: Insufficient documentation

## 2021-08-18 DIAGNOSIS — R4701 Aphasia: Secondary | ICD-10-CM

## 2021-08-18 LAB — CBC
HCT: 34.5 % — ABNORMAL LOW (ref 36.0–46.0)
Hemoglobin: 11 g/dL — ABNORMAL LOW (ref 12.0–15.0)
MCH: 28.3 pg (ref 26.0–34.0)
MCHC: 31.9 g/dL (ref 30.0–36.0)
MCV: 88.7 fL (ref 80.0–100.0)
Platelets: 186 10*3/uL (ref 150–400)
RBC: 3.89 MIL/uL (ref 3.87–5.11)
RDW: 14.8 % (ref 11.5–15.5)
WBC: 8.1 10*3/uL (ref 4.0–10.5)
nRBC: 0 % (ref 0.0–0.2)

## 2021-08-18 LAB — URINALYSIS, ROUTINE W REFLEX MICROSCOPIC
Bilirubin Urine: NEGATIVE
Glucose, UA: NEGATIVE mg/dL
Hgb urine dipstick: NEGATIVE
Ketones, ur: NEGATIVE mg/dL
Leukocytes,Ua: NEGATIVE
Nitrite: NEGATIVE
Protein, ur: NEGATIVE mg/dL
Specific Gravity, Urine: 1.004 — ABNORMAL LOW (ref 1.005–1.030)
pH: 6 (ref 5.0–8.0)

## 2021-08-18 LAB — COMPREHENSIVE METABOLIC PANEL
ALT: 25 U/L (ref 0–44)
AST: 26 U/L (ref 15–41)
Albumin: 3.3 g/dL — ABNORMAL LOW (ref 3.5–5.0)
Alkaline Phosphatase: 87 U/L (ref 38–126)
Anion gap: 8 (ref 5–15)
BUN: 16 mg/dL (ref 8–23)
CO2: 25 mmol/L (ref 22–32)
Calcium: 7.5 mg/dL — ABNORMAL LOW (ref 8.9–10.3)
Chloride: 110 mmol/L (ref 98–111)
Creatinine, Ser: 0.85 mg/dL (ref 0.44–1.00)
GFR, Estimated: >60 mL/min (ref >60)
Glucose, Bld: 152 mg/dL — ABNORMAL HIGH (ref 70–99)
Potassium: 3.1 mmol/L — ABNORMAL LOW (ref 3.5–5.1)
Sodium: 143 mmol/L (ref 135–145)
Total Bilirubin: 0.4 mg/dL (ref 0.3–1.2)
Total Protein: 6.6 g/dL (ref 6.5–8.1)

## 2021-08-18 LAB — DIFFERENTIAL
Abs Immature Granulocytes: 0.02 10*3/uL (ref 0.00–0.07)
Basophils Absolute: 0 10*3/uL (ref 0.0–0.1)
Basophils Relative: 1 %
Eosinophils Absolute: 0.2 10*3/uL (ref 0.0–0.5)
Eosinophils Relative: 3 %
Immature Granulocytes: 0 %
Lymphocytes Relative: 31 %
Lymphs Abs: 2.5 10*3/uL (ref 0.7–4.0)
Monocytes Absolute: 0.5 10*3/uL (ref 0.1–1.0)
Monocytes Relative: 6 %
Neutro Abs: 4.8 10*3/uL (ref 1.7–7.7)
Neutrophils Relative %: 59 %

## 2021-08-18 MED ORDER — PROMETHAZINE HCL 25 MG/ML IJ SOLN
6.2500 mg | Freq: Once | INTRAMUSCULAR | Status: AC
  Administered 2021-08-18: 6.25 mg via INTRAMUSCULAR
  Filled 2021-08-18: qty 1

## 2021-08-18 MED ORDER — FOLIC ACID 1 MG PO TABS
1.0000 mg | ORAL_TABLET | Freq: Every day | ORAL | Status: DC
  Administered 2021-08-19: 1 mg via ORAL
  Filled 2021-08-18: qty 1

## 2021-08-18 MED ORDER — BUDESONIDE 3 MG PO CPEP
9.0000 mg | ORAL_CAPSULE | Freq: Every day | ORAL | Status: DC
  Administered 2021-08-19 – 2021-08-20 (×2): 9 mg via ORAL
  Filled 2021-08-18 (×2): qty 3

## 2021-08-18 MED ORDER — ALBUTEROL SULFATE HFA 108 (90 BASE) MCG/ACT IN AERS
1.0000 | INHALATION_SPRAY | RESPIRATORY_TRACT | Status: DC | PRN
Start: 2021-08-18 — End: 2021-08-19

## 2021-08-18 MED ORDER — CHOLESTYRAMINE LIGHT 4 G PO PACK
1.0000 | PACK | Freq: Every day | ORAL | Status: DC
  Administered 2021-08-19 – 2021-08-20 (×2): 1 via ORAL
  Filled 2021-08-18 (×2): qty 1

## 2021-08-18 MED ORDER — SENNOSIDES-DOCUSATE SODIUM 8.6-50 MG PO TABS: 1.0000 | ORAL_TABLET | Freq: Every evening | ORAL | Status: DC | PRN

## 2021-08-18 MED ORDER — ENOXAPARIN SODIUM 40 MG/0.4ML IJ SOSY
40.0000 mg | PREFILLED_SYRINGE | INTRAMUSCULAR | Status: DC
  Administered 2021-08-19 – 2021-08-20 (×2): 40 mg via SUBCUTANEOUS
  Filled 2021-08-18 (×2): qty 0.4

## 2021-08-18 MED ORDER — SODIUM CHLORIDE 0.9% FLUSH
3.0000 mL | Freq: Once | INTRAVENOUS | Status: AC
  Administered 2021-08-18: 3 mL via INTRAVENOUS

## 2021-08-18 MED ORDER — ACETAMINOPHEN 650 MG RE SUPP: 650.0000 mg | RECTAL | Status: DC | PRN

## 2021-08-18 MED ORDER — INSULIN GLARGINE-YFGN 100 UNIT/ML ~~LOC~~ SOLN
10.0000 [IU] | Freq: Every day | SUBCUTANEOUS | Status: DC
  Filled 2021-08-18: qty 0.1

## 2021-08-18 MED ORDER — LEVOTHYROXINE SODIUM 100 MCG PO TABS
100.0000 ug | ORAL_TABLET | Freq: Every day | ORAL | Status: DC
  Administered 2021-08-19 – 2021-08-20 (×2): 100 ug via ORAL
  Filled 2021-08-18: qty 1
  Filled 2021-08-18: qty 2

## 2021-08-18 MED ORDER — ACETAMINOPHEN 160 MG/5ML PO SOLN: 650.0000 mg | ORAL | Status: DC | PRN

## 2021-08-18 MED ORDER — ACETAMINOPHEN 325 MG PO TABS
650.0000 mg | ORAL_TABLET | ORAL | Status: DC | PRN
  Administered 2021-08-19 – 2021-08-20 (×3): 650 mg via ORAL
  Filled 2021-08-18 (×3): qty 2

## 2021-08-18 MED ORDER — RIFAXIMIN 550 MG PO TABS
550.0000 mg | ORAL_TABLET | Freq: Three times a day (TID) | ORAL | Status: DC
  Administered 2021-08-19 – 2021-08-20 (×5): 550 mg via ORAL
  Filled 2021-08-18 (×6): qty 1

## 2021-08-18 MED ORDER — MORPHINE SULFATE (PF) 2 MG/ML IV SOLN
2.0000 mg | Freq: Once | INTRAVENOUS | Status: AC
  Administered 2021-08-18: 2 mg via INTRAVENOUS
  Filled 2021-08-18: qty 1

## 2021-08-18 MED ORDER — CYANOCOBALAMIN 1000 MCG/ML IJ SOLN
1000.0000 ug | INTRAMUSCULAR | Status: DC
Start: 2021-09-07 — End: 2021-08-20

## 2021-08-18 MED ORDER — ASPIRIN 81 MG PO CHEW
324.0000 mg | CHEWABLE_TABLET | Freq: Once | ORAL | Status: AC
  Administered 2021-08-18: 324 mg via ORAL
  Filled 2021-08-18: qty 4

## 2021-08-18 MED ORDER — POLYVINYL ALCOHOL 1.4 % OP SOLN
1.0000 [drp] | Freq: Three times a day (TID) | OPHTHALMIC | Status: DC | PRN
Start: 2021-08-18 — End: 2021-08-20

## 2021-08-18 MED ORDER — LORATADINE 10 MG PO TABS
10.0000 mg | ORAL_TABLET | Freq: Every day | ORAL | Status: DC
  Administered 2021-08-19 – 2021-08-20 (×2): 10 mg via ORAL
  Filled 2021-08-18 (×2): qty 1

## 2021-08-18 MED ORDER — LORAZEPAM 1 MG PO TABS
1.0000 mg | ORAL_TABLET | Freq: Once | ORAL | Status: AC
Start: 2021-08-19 — End: 2021-08-19
  Administered 2021-08-19: 1 mg via ORAL
  Filled 2021-08-18: qty 1

## 2021-08-18 MED ORDER — TURMERIC 500 MG PO CAPS: 500.0000 mg | ORAL_CAPSULE | Freq: Every day | ORAL | Status: DC

## 2021-08-18 MED ORDER — MAGNESIUM OXIDE 400 MG PO TABS
400.0000 mg | ORAL_TABLET | Freq: Every day | ORAL | Status: DC
  Administered 2021-08-19: 400 mg via ORAL
  Filled 2021-08-18: qty 1

## 2021-08-18 MED ORDER — THYROID 30 MG PO TABS
30.0000 mg | ORAL_TABLET | Freq: Every day | ORAL | Status: DC
  Administered 2021-08-19 – 2021-08-20 (×2): 30 mg via ORAL
  Filled 2021-08-18 (×2): qty 1

## 2021-08-18 MED ORDER — INSULIN ASPART 100 UNIT/ML IJ SOLN
0.0000 [IU] | Freq: Three times a day (TID) | INTRAMUSCULAR | Status: DC
  Administered 2021-08-19: 2 [IU] via SUBCUTANEOUS
  Administered 2021-08-20: 3 [IU] via SUBCUTANEOUS
  Filled 2021-08-18 (×2): qty 1

## 2021-08-18 MED ORDER — SODIUM CHLORIDE 0.9 % IV SOLN: INTRAVENOUS | Status: DC

## 2021-08-18 MED ORDER — KETOROLAC TROMETHAMINE 15 MG/ML IJ SOLN
15.0000 mg | Freq: Four times a day (QID) | INTRAMUSCULAR | Status: DC | PRN
Start: 2021-08-18 — End: 2021-08-20

## 2021-08-18 NOTE — Assessment & Plan Note (Signed)
Patient reported an episode at 11AM of dysarthria leading to presentation to [facility]-ED for evaluation. Symptoms had resolved by time she was evaluated. Tele-neuro consult obtained. Patient had non-focal exam. Neuro recommended MRI brai w/wo contrast.  Plan obs admit  MRI brain w/wo contrast

## 2021-08-18 NOTE — Consult Note (Signed)
[facility] Tele[facility]urology Consult Services   Patient Name:   Joanna Ortiz, Joanna Ortiz Date of Birth:   [birth year] Identification Number:   MRN - [id number] Date of Service:   08/18/2021 20:22:19  Diagnosis:       R51.9 - Headache, unspecified  Impression:      76 year old female who presents with 2 days of severe headache as well as an episode of word finding difficulty. Given patient's age recommend to get MRI brain w/wo contrast.  Our recommendations are outlined below.  Recommendations:        Stroke/Telemetry Floor       Neuro Checks       Bedside Swallow Eval       DVT Prophylaxis       IV Fluids, Normal Saline       Head of Bed 30 Degrees       Euglycemia and Avoid Hyperthermia (PRN Acetaminophen)       Initiate or continue Aspirin 81 MG daily  Per facility request will defer further work up, management, and referrals to inpatient service, inclusive of inpatient neurology consult.  Sign Out:       Discussed with Emergency Department Provider    ------------------------------------------------------------------------------  Advanced Imaging: Advanced Imaging Deferred because:  Non-disabling symptoms as verified by the patient; no cortical signs so not consistent with LVO   Metrics: Last Known Well: Unknown [facility] Notification Time: 08/18/2021 20:22:06 Arrival Time: 08/18/2021 14:50:00 Stamp Time: 08/18/2021 20:22:19 Initial Response Time: 08/18/2021 20:26:11 Symptoms: TIA vs Headache. Initial patient interaction: 08/18/2021 20:27:27 NIHSS Assessment Completed: 08/18/2021 20:28:20 Patient is not a candidate for Thrombolytic. Thrombolytic Medical Decision: 08/18/2021 20:28:00 Patient was not deemed candidate for Thrombolytic because of following reasons: Last Well Known Above 4.5 Hours. No disabling symptoms.  CT head showed no acute hemorrhage or acute core infarct.  Primary Provider Notified of Diagnostic Impression and Management  Plan on: 08/18/2021 20:48:11    ------------------------------------------------------------------------------  History of Present Illness: Patient is a 76 year old Female.  Patient was brought by private transportation with symptoms of TIA vs Headache. 76 year old female who presents with 2 days of severe headache with an episode of word finding difficulty. Patient had an episode of nonsense speech around 11am this morning and symptoms lasted approximately 1 hour. On exam in the ED patient did not have any focal weakness, numbness or trouble speaking.   Past Medical History:      Hypertension      Diabetes Mellitus      There is no history of Migraine Headaches  Medications:  No Anticoagulant use  No Antiplatelet use Reviewed EMR for current medications  Allergies:  Reviewed  Social History: Smoking: No  Family History:  There is no family history of premature cerebrovascular disease pertinent to this consultation  ROS : 14 Points Review of Systems was performed and was negative except mentioned in HPI.  Past Surgical History: There Is No Surgical History Contributory To Today's Visit    Examination: BP(171/72), Pulse(65), 1A: Level of Consciousness - Alert; keenly responsive + 0 1B: Ask Month and Age - Both Questions Right + 0 1C: Blink Eyes & Squeeze Hands - Performs Both Tasks + 0 2: Test Horizontal Extraocular Movements - Normal + 0 3: Test Visual Fields - No Visual Loss + 0 4: Test Facial Palsy (Use Grimace if Obtunded) - Normal symmetry + 0 5A: Test Left Arm Motor Drift - No Drift for 10 Seconds + 0 5B: Test Right Arm Motor  Drift - No Drift for 10 Seconds + 0 6A: Test Left Leg Motor Drift - No Drift for 5 Seconds + 0 6B: Test Right Leg Motor Drift - No Drift for 5 Seconds + 0 7: Test Limb Ataxia (FNF/Heel-Shin) - No Ataxia + 0 8: Test Sensation - Normal; No sensory loss + 0 9: Test Language/Aphasia - Normal; No aphasia + 0 10: Test Dysarthria - Normal +  0 11: Test Extinction/Inattention - No abnormality + 0  NIHSS Score: 0   Pre-Morbid Modified Rankin Scale: 1 Points = No significant disability despite symptoms; able to carry out all usual duties and activities   Patient/Family was informed the Neurology Consult would occur via TeleHealth consult by way of interactive audio and video telecommunications and consented to receiving care in this manner.   Patient is being evaluated for possible acute neurologic impairment and high probability of imminent or life-threatening deterioration. I spent total of 30 minutes providing care to this patient, including time for face to face visit via telemedicine, review of medical records, imaging studies and discussion of findings with providers, the patient and/or family.   Dr Tsosie Billing   [facility] [phone]   Case [id number]

## 2021-08-18 NOTE — ED Provider Notes (Signed)
[facility] Provider Note   Event Date/Time   First MD Initiated Contact with Patient 08/18/21 1948     (approximate)  History   Aphasia   HPI  Joanna Ortiz is a 76 y.o. female history of previous headaches last seen for headache in about December timeframe diabetes hypertension hyperlipidemia hypothyroidism pneumonia  Yesterday experienced a moderate to severe whole head headache.  It improved, but some headache also today but at 11 AM along with a moderate headache she notes she had trouble forming words this lasted for a brief period of time and resolved by the time she got to the emergency room  She reports now she just has a moderate 7 out of 10 count of throbbing headache over both temporal regions.  No eye pain.  No vision changes  She reports in the past Phenergan helped relieve this.  She has never had an issue though where she had difficulty speaking  No numbness or weakness in the arms or legs.  No facial droop no chest pain or trouble breathing     Physical Exam   Triage Vital Signs: ED Triage Vitals  Enc Vitals Group     BP 08/18/21 1503 (!) 123/96     Pulse Rate 08/18/21 1503 72     Resp 08/18/21 1503 18     Temp 08/18/21 1503 98 F (36.7 C)     Temp Source 08/18/21 1503 Oral     SpO2 08/18/21 1503 100 %     Weight 08/18/21 1504 169 lb 12.1 oz (77 kg)     Height 08/18/21 1504 _0  (1.727 m)     Head Circumference --      Peak Flow --      Pain Score 08/18/21 1503 8     Pain Loc --      Pain Edu? --      Excl. in [place]? --     Most recent vital signs: Vitals:   08/18/21 1930 08/18/21 2035  BP: (!) 149/64 (!) 171/72  Pulse: 62 66  Resp: 17 14  Temp:    SpO2: 98% 99%     General: Awake, no distress.  CV:  Good peripheral perfusion. Resp:  Normal effort.  Abd:  No distention.  Other:  Alert fully oriented speaks with clear speech.  [place] accent.  Moves all extremities well no deficits no facial droop.  Denies any  acute neurologic symptoms at this time other than a moderate 7 out of 10 headache.  No distinct temporal artery tenderness, and has normal temporal artery pulsations bilaterally.  The anterior eyes appear normal without injection.  Denies eye pain.  Extraocular movements normal.   ED Results / Procedures / Treatments   Labs (all labs ordered are listed, but only abnormal results are displayed) Labs Reviewed  CBC - Abnormal; Notable for the following components:      Result Value   Hemoglobin 11.0 (*)    HCT 34.5 (*)    All other components within normal limits  COMPREHENSIVE METABOLIC PANEL - Abnormal; Notable for the following components:   Potassium 3.1 (*)    Glucose, Bld 152 (*)    Calcium 7.5 (*)    Albumin 3.3 (*)    All other components within normal limits  DIFFERENTIAL  SEDIMENTATION RATE  C-REACTIVE PROTEIN  URINALYSIS, ROUTINE W REFLEX MICROSCOPIC  PROTIME-INR  APTT  ETHANOL  CBG MONITORING, ED     EKG  Interpreted by me 1510  heart rate 70 QRS 100 QTc 460 normal sinus rhythm no acute ischemia   RADIOLOGY  CT head interpreted by me as negative for acute gross hemorrhage  CT HEAD WO CONTRAST  Result Date: 08/18/2021 CLINICAL DATA:  Stroke, follow up EXAM: CT HEAD WITHOUT CONTRAST TECHNIQUE: Contiguous axial images were obtained from the base of the skull through the vertex without intravenous contrast. RADIATION DOSE REDUCTION: This exam was performed according to the departmental dose-optimization program which includes automated exposure control, adjustment of the mA and/or kV according to patient size and/or use of iterative reconstruction technique. COMPARISON:  December 2022 FINDINGS: Brain: There is no acute intracranial hemorrhage, mass effect, or edema. Gray-white differentiation is preserved. There is no extra-axial fluid collection. Ventricles and sulci are stable in size and configuration. Vascular: There is atherosclerotic calcification at the skull base.  Skull: Calvarium is unremarkable. Sinuses/Orbits: No acute finding. Other: None. IMPRESSION: No acute intracranial hemorrhage or evidence of acute infarction. Electronically Signed   By: Macy Mis M.D.   On: 08/18/2021 15:56         PROCEDURES:  Critical Care performed: No  Procedures   MEDICATIONS ORDERED IN ED: Medications  sodium chloride flush (NS) 0.9 % injection 3 mL (3 mLs Intravenous Given 08/18/21 1951)  promethazine (PHENERGAN) injection 6.25 mg (6.25 mg Intramuscular Given 08/18/21 2107)  morphine (PF) 2 MG/ML injection 2 mg (2 mg Intravenous Given 08/18/21 2109)  aspirin chewable tablet 324 mg (324 mg Oral Given 08/18/21 2104)     IMPRESSION / MDM / ASSESSMENT AND PLAN / ED COURSE  I reviewed the triage vital signs and the nursing notes.                              Differential diagnosis includes, but is not limited to, headache with brief episode of what appears to be expressive aphasia, neurology consult obtained, CT of the head here reassuring.  Previous imaging including angiography without noted aneurysm in December.  Clinical symptoms and findings today do not seem to appear to represent obvious aneurysmal rupture or subarachnoid hemorrhage, but her symptoms are concerning for other causes potentially stroke TIA, rule out brain lesions, or other acute neurologic symptomatology.  No associated cardiopulmonary symptoms  Doubt giant cell arteritis ESR pending.  Discussed case with our neurologist Dr. Dorice Lamas (spelling), who recommends admission for MRI and further strokelike work-up/rule out TIA.  Patient and husband understand agreeable with plan.  Medicated with Phenergan and morphine for headache relief  Patient's presentation is most consistent with acute presentation with potential threat to life or bodily function.  The patient is on the cardiac monitor to evaluate for evidence of arrhythmia and/or significant heart rate changes.  Labs interpreted as  normal CBC except for slight anemia and normal metabolic panel except for slight hypocalcemia and's also hypokalemia.  Clinical Course as of 08/18/21 2122  Fri Aug 18, 2021  1948 Slight delay in initial evaluation as was performing a procedure on a different patient [MQ]    Clinical Course User Index [MQ] Delman Kitten, MD   Consulted with hospitalist Dr. Linda Hedges.  After discussion will be admitted to the hospitalist service for further work-up.  FINAL CLINICAL IMPRESSION(S) / ED DIAGNOSES   Final diagnoses:  Acute nonintractable headache, unspecified headache type  Expressive aphasia     Rx / DC Orders   ED Discharge Orders     None  Note:  This document was prepared using Dragon voice recognition software and may include unintentional dictation errors.   Delman Kitten, MD 08/18/21 2122

## 2021-08-18 NOTE — ED Provider Triage Note (Signed)
Emergency Medicine Provider Triage Evaluation Note  Joanna Ortiz , a 76 y.o. female  was evaluated in triage.  Pt complains of slurred speech since about 10am with shuffling gait. She has a headache that started last night. She took some OTC medicine without relief. Husband states she has had trouble forming a sentence since slurred speech started. Patient states that she knows what she wants to say, but can't get the words out.    Physical Exam  There were no vitals taken for this visit. Gen:   Awake, no distress   Resp:  Normal effort  MSK:   Moves extremities without difficulty  Other:  Slurred speech, no facial droop, slight pronator drift on right. Equal grip strength; right lower extremity weakness noted.  Medical Decision Making  Medically screening exam initiated at 3:00 PM.  Appropriate orders placed.  Natasha Bence Glazier was informed that the remainder of the evaluation will be completed by another provider, this initial triage assessment does not replace that evaluation, and the importance of remaining in the ED until their evaluation is complete. \ Discussed with ED attending.  Outside of window for medication--will start CVA workup, but not call official code stroke.    Victorino Dike, FNP 08/18/21 [phone]

## 2021-08-18 NOTE — ED Notes (Signed)
Teleneuro at bedside 

## 2021-08-18 NOTE — Assessment & Plan Note (Addendum)
Seen in December '22 for HA. Had been evaluated by Ophthalmology and r/o for arteritis with negative ESR, CRP. Had recurrent HA 6/29/223 that was bad but resolved. This AM she had had transient dysarthria and a HA rated as 7/10. She was tender to palpation over temporal vessels.  Plan ESR, CRP  NSAID for pain - Ketorolac.

## 2021-08-18 NOTE — H&P (Signed)
History and Physical    Joanna Ortiz MGN:[id number] DOB: 04/27/1945 DOA: 08/18/2021  DOS: the patient was seen and examined on 08/18/2021  PCP: Sharyne Peach, MD   Patient coming from: Home  I have personally briefly reviewed patient's old medical records in [facility]  [name], a 76 y/o, followed for DM, Crohn's was seen and evaluated in Dec '22 for HA which was unrevealing. She had been worked up by Ophthalmology for arteritis that was negative with nl ESR , CRP. She had onset of a severe HA 08/17/21 which did resolved. The morning of 08/18/21 she had recudrent severe HA and an episode of dysarthria. She presents to [facility]ED for evaluation.    ED Course: Vitals stable. Per DPR - non-focal exam. Code stroke initiated. Tele-neuro eval, Dr. Ramon Dredge, recommended admission and MRI brain w/wo contrast.. Labs remarkable for glucose 152, Albumin 3.3. U/A negative, CT head negative for acute changes. TRH called to admit to continue evaluation for TIA.   Review of Systems:  Review of Systems  Constitutional: Negative.   HENT: Negative.    Eyes: Negative.   Respiratory: Negative.    Cardiovascular: Negative.   Gastrointestinal: Negative.   Genitourinary: Negative.   Musculoskeletal: Negative.   Skin: Negative.   Neurological:  Positive for speech change and headaches. Negative for focal weakness and weakness.       Transient dysarthria resolved by time of presentation. Tenderness to temporal region bilaterally  Endo/Heme/Allergies: Negative.   Psychiatric/Behavioral: Negative.      Past Medical History:  Diagnosis Date   Anemia    Ankylosing spondylitis (HCC)    Arthritis    Bilateral carotid artery stenosis    Collagen vascular disease (HCC)    Coronary artery disease    Crohn disease ([place])    Diabetes mellitus without complication (HCC)    diet controlled   GERD (gastroesophageal reflux disease)    Hyperlipidemia    Hypothyroidism    Pneumonia    3/18   PONV  (postoperative nausea and vomiting)    Rheumatoid arthritis ([place])    Thyroid disease    goiter    Past Surgical History:  Procedure Laterality Date   ABDOMINAL HYSTERECTOMY  1987   APPENDECTOMY     BREAST BIOPSY Bilateral    neg   CATARACT EXTRACTION W/PHACO Right 11/06/2016   Procedure: CATARACT EXTRACTION PHACO AND INTRAOCULAR LENS PLACEMENT ([place]);  Surgeon: Birder Robson, MD;  Location: [facility] ORS;  Service: Ophthalmology;  Laterality: Right;  [place] 00:42.6 AP% 16.7 CDE 7.14 Fluid Pack Lot # [id number]   CATARACT EXTRACTION W/PHACO Left 12/25/2016   Procedure: CATARACT EXTRACTION PHACO AND INTRAOCULAR LENS PLACEMENT ([facility]);  Surgeon: Birder Robson, MD;  Location: [facility] ORS;  Service: Ophthalmology;  Laterality: Left;  [place] 00:32 AP% 13.7 CDE 4.40 Fluid pack lot # [id number]   COLON SURGERY  06/06/2011   Resection of terminal ileum and previous ileocolonic anastomosis for recurrent Crohn's disease. 3 focal strictures identified.   COLONOSCOPY  2013   COLONOSCOPY WITH PROPOFOL N/A 07/28/2021   Procedure: COLONOSCOPY WITH PROPOFOL;  Surgeon: Lesly Rubenstein, MD;  Location: [facility] ENDOSCOPY;  Service: Endoscopy;  Laterality: N/A;  IDDM   JOINT REPLACEMENT Left    SHOULDER   LEFT HEART CATH AND CORONARY ANGIOGRAPHY Left 04/04/2018   Procedure: LEFT HEART CATH AND CORONARY ANGIOGRAPHY;  Surgeon: Corey Skains, MD;  Location: [facility];  Service: Cardiovascular;  Laterality: Left;   SHOULDER SURGERY Right 11/20/2018   Procedure:  R SHOULDER ARTHROSCOPY: biceps tenomyotomy, distal claviculectomy, acromioplasty, posterior capsule release, debridement, decrompression of subacromial space, coracoacromial release, bursectomy   SMALL INTESTINE SURGERY  2012   THYROID SURGERY  1992   TONSILLECTOMY      Soc Hx - native of [place]. Met her husband in [place] when he was stationed there while in [employer]. Married 56 years. She has 4 sons, 2 daughters, 9 grandchildren. She is an avid  Research scientist (life sciences).    reports that she quit smoking about 51 years ago. Her smoking use included cigarettes. She has a 12.00 pack-year smoking history. She has never used smokeless tobacco. She reports that she does not drink alcohol and does not use drugs.  Allergies  Allergen Reactions   Erythromycin Hives   Hydromorphone Hives   Cefuroxime Nausea And Vomiting   Ciprocinonide [Fluocinolone]     Muscle pain    Codeine     Other reaction(s): Vomiting   Floxin [Ofloxacin] Nausea And Vomiting   Keflex [Cephalexin] Nausea And Vomiting   Levofloxacin Nausea And Vomiting   Sulfa Antibiotics Nausea And Vomiting   Ciprofloxacin Nausea And Vomiting    Family History  Problem Relation Age of Onset   Breast cancer Sister 45   Crohn's disease Mother    Heart attack Father 63    Prior to Admission medications   Medication Sig Start Date End Date Taking? Authorizing Provider  acetaminophen (TYLENOL) 500 MG tablet Take 1,000 mg by mouth every 8 (eight) hours as needed for mild pain or moderate pain.   Yes [provider]  albuterol (VENTOLIN HFA) 108 (90 Base) MCG/ACT inhaler Inhale 1-2 puffs into the lungs every 4 (four) hours as needed for wheezing or shortness of breath. 10/21/19  Yes Melynda Ripple, MD  Ascorbic Acid (VITAMIN C) 1000 MG tablet Take 1,000 mg by mouth 3 (three) times a week.    Yes [provider]  aspirin EC 81 MG tablet Take 81 mg by mouth daily. Swallow whole.   Yes [provider]  budesonide (ENTOCORT EC) 3 MG 24 hr capsule Take 9 mg by mouth daily. 08/01/21  Yes [provider]  butalbital-acetaminophen-caffeine (FIORICET) 50-325-40 MG tablet Take 1 tablet by mouth every 6 (six) hours as needed for headache. 02/01/21 02/01/22 Yes Cuthriell, Charline Bills, PA-C  cholestyramine light (PREVALITE) 4 g packet Take 1 packet by mouth daily. 03/22/21  Yes [provider]  CINNAMON PO Take 1,200 mg by mouth daily.   Yes [provider]   cyanocobalamin (,VITAMIN B-12,) 1000 MCG/ML injection Inject 1,000 mcg into the muscle every 30 (thirty) days.   Yes [provider]  folic acid (FOLVITE) 1 MG tablet Take 1 mg by mouth daily. 04/21/21  Yes [provider]  Insulin Glargine (LANTUS) 100 UNIT/ML Solostar Pen Inject 10 Units into the skin daily. 02/26/18  Yes Cook, Jayce G, DO  Insulin Lispro w/ Trans Port 100 UNIT/ML SOPN SSI  2 units 150-200 4 units-201-250 6 units 251-300 8 units 301-350 10 units-351-400 >400 call MD 05/24/20  Yes [provider]  loratadine (CLARITIN) 10 MG tablet Take 10 mg by mouth daily.   Yes [provider]  magnesium oxide (MAG-OX) 400 MG tablet Take 400 mg by mouth daily.   Yes [provider]  methotrexate (RHEUMATREX) 2.5 MG tablet Take 10 mg by mouth once a week. 04/21/21  Yes [provider]  SYNTHROID 100 MCG tablet Take 100 mcg by mouth daily before breakfast.  04/27/14  Yes  [provider]  THERATEARS 0.25 % SOLN Place 1 drop into both eyes 3 (three) times daily as needed (dry/irritated eyes.).   Yes [provider]  thyroid (ARMOUR) 30 MG tablet Take 30 mg by mouth daily before breakfast.    Yes [provider]  Turmeric 500 MG CAPS Take 500 mg by mouth daily.   Yes [provider]  XIFAXAN 550 MG TABS tablet Take 550 mg by mouth 3 (three) times daily. 06/22/20  Yes [provider]  Adalimumab 40 MG/0.4ML PSKT Inject 40 mg into the skin every 14 (fourteen) days.  Patient not taking: Reported on 08/18/2021 10/03/17   [provider]  fluticasone (FLONASE) 50 MCG/ACT nasal spray Place 2 sprays into both nostrils daily. Patient not taking: Reported on 08/18/2021 03/22/21   [provider]  Insulin Lispro Prot & Lispro (HUMALOG MIX 50/50 KWIKPEN) (50-50) 100 UNIT/ML Kwikpen Inject into the skin. Patient not taking: Reported on 08/18/2021 11/07/20 11/07/21  [provider]  Na Sulfate-K Sulfate-Mg  Sulf 17.5-3.13-1.6 GM/177ML SOLN Take 1 Bottle by mouth as directed. Patient not taking: Reported on 08/18/2021 07/25/21   [provider]  Spacer/Aero-Holding Chambers (AEROCHAMBER PLUS) inhaler Use as instructed Patient not taking: Reported on 08/18/2021 10/20/19   Melynda Ripple, MD    Physical Exam: Vitals:   08/18/21 1930 08/18/21 2035 08/18/21 2130 08/18/21 2230  BP: (!) 149/64 (!) 171/72 (!) 159/77 123/68  Pulse: 62 66 (!) 59 60  Resp: 17 14 (!) 21 17  Temp:      TempSrc:      SpO2: 98% 99% 97% 96%  Weight:      Height:        Physical Exam Constitutional:      Appearance: Normal appearance.     Comments: overweight  HENT:     Head: Normocephalic and atraumatic.     Nose: Nose normal.     Mouth/Throat:     Mouth: Mucous membranes are moist.     Comments: Edentulous. Has upper denture in place Eyes:     General: No visual field deficit.    Extraocular Movements: Extraocular movements intact.     Conjunctiva/sclera: Conjunctivae normal.     Pupils: Pupils are equal, round, and reactive to light.  Neck:     Vascular: No carotid bruit.  Cardiovascular:     Rate and Rhythm: Normal rate and regular rhythm.     Pulses: Normal pulses.     Heart sounds: Normal heart sounds.  Pulmonary:     Effort: Pulmonary effort is normal.     Breath sounds: Normal breath sounds.  Abdominal:     General: Bowel sounds are normal.     Palpations: Abdomen is soft.     Tenderness: There is no abdominal tenderness.  Musculoskeletal:        General: No swelling or deformity. Normal range of motion.     Cervical back: Normal range of motion and neck supple.  Skin:    General: Skin is warm and dry.  Neurological:     Mental Status: She is alert and oriented to person, place, and time.     Cranial Nerves: Cranial nerves 2-12 are intact. No cranial nerve deficit, dysarthria or facial asymmetry.     Sensory: Sensation is intact.     Motor: Motor function is intact.      Coordination: Coordination is intact.     Deep Tendon Reflexes: Reflexes are normal and symmetric.  Psychiatric:  Mood and Affect: Mood normal.        Behavior: Behavior normal.      Labs on Admission: I have personally reviewed following labs and imaging studies  CBC: Recent Labs  Lab 08/18/21 1508  WBC 8.1  NEUTROABS 4.8  HGB 11.0*  HCT 34.5*  MCV 88.7  PLT 867   Basic Metabolic Panel: Recent Labs  Lab 08/18/21 1508  NA 143  K 3.1*  CL 110  CO2 25  GLUCOSE 152*  BUN 16  CREATININE 0.85  CALCIUM 7.5*   GFR: Estimated Creatinine Clearance: 61.4 mL/min (by C-G formula based on SCr of 0.85 mg/dL). Liver Function Tests: Recent Labs  Lab 08/18/21 1508  AST 26  ALT 25  ALKPHOS 87  BILITOT 0.4  PROT 6.6  ALBUMIN 3.3*   No results for input(s): "LIPASE", "AMYLASE" in the last 168 hours. No results for input(s): "AMMONIA" in the last 168 hours. Coagulation Profile: No results for input(s): "INR", "PROTIME" in the last 168 hours. Cardiac Enzymes: No results for input(s): "CKTOTAL", "CKMB", "CKMBINDEX", "TROPONINI" in the last 168 hours. BNP (last 3 results) No results for input(s): "PROBNP" in the last 8760 hours. HbA1C: No results for input(s): "HGBA1C" in the last 72 hours. CBG: No results for input(s): "GLUCAP" in the last 168 hours. Lipid Profile: No results for input(s): "CHOL", "HDL", "LDLCALC", "TRIG", "CHOLHDL", "LDLDIRECT" in the last 72 hours. Thyroid Function Tests: No results for input(s): "TSH", "T4TOTAL", "FREET4", "T3FREE", "THYROIDAB" in the last 72 hours. Anemia Panel: No results for input(s): "VITAMINB12", "FOLATE", "FERRITIN", "TIBC", "IRON", "RETICCTPCT" in the last 72 hours. Urine analysis:    Component Value Date/Time   COLORURINE STRAW (A) 08/18/2021 2141   APPEARANCEUR CLEAR (A) 08/18/2021 2141   APPEARANCEUR Clear 03/06/2011 0952   LABSPEC 1.004 (L) 08/18/2021 2141   LABSPEC 1.002 03/06/2011 0952   PHURINE 6.0 08/18/2021  2141   GLUCOSEU NEGATIVE 08/18/2021 2141   GLUCOSEU Negative 03/06/2011 0952   HGBUR NEGATIVE 08/18/2021 2141   BILIRUBINUR NEGATIVE 08/18/2021 2141   BILIRUBINUR Negative 03/06/2011 [place] 08/18/2021 2141   PROTEINUR NEGATIVE 08/18/2021 2141   UROBILINOGEN 0.2 08/24/2020 1900   NITRITE NEGATIVE 08/18/2021 2141   LEUKOCYTESUR NEGATIVE 08/18/2021 2141   LEUKOCYTESUR Negative 03/06/2011 0952    Radiological Exams on Admission: I have personally reviewed images CT HEAD WO CONTRAST  Result Date: 08/18/2021 CLINICAL DATA:  Stroke, follow up EXAM: CT HEAD WITHOUT CONTRAST TECHNIQUE: Contiguous axial images were obtained from the base of the skull through the vertex without intravenous contrast. RADIATION DOSE REDUCTION: This exam was performed according to the departmental dose-optimization program which includes automated exposure control, adjustment of the mA and/or kV according to patient size and/or use of iterative reconstruction technique. COMPARISON:  December 2022 FINDINGS: Brain: There is no acute intracranial hemorrhage, mass effect, or edema. Gray-white differentiation is preserved. There is no extra-axial fluid collection. Ventricles and sulci are stable in size and configuration. Vascular: There is atherosclerotic calcification at the skull base. Skull: Calvarium is unremarkable. Sinuses/Orbits: No acute finding. Other: None. IMPRESSION: No acute intracranial hemorrhage or evidence of acute infarction. Electronically Signed   By: Macy Mis M.D.   On: 08/18/2021 15:56    EKG: I have personally reviewed EKG: Sinus rhythm, LAE, LAFB, no acute changes  Assessment/Plan Principal Problem:   Dysarthria Active Problems:   Temporal headache   Crohn's disease (HCC)   DM2 (diabetes mellitus, type 2) (HCC)    Assessment and Plan: * Dysarthria  Patient reported an episode at 11AM of dysarthria leading to presentation to [facility]ED for evaluation. Symptoms had resolved by  time she was evaluated. Tele-neuro consult obtained. Patient had non-focal exam. Neuro recommended MRI brai w/wo contrast.  Plan obs admit  MRI brain w/wo contrast    Temporal headache Seen in December '22 for HA. Had been evaluated by Ophthalmology and r/o for arteritis with negative ESR, CRP. Had recurrent HA 6/29/223 that was bad but resolved. This AM she had had transient dysarthria and a HA rated as 7/10. She was tender to palpation over temporal vessels.  Plan ESR, CRP  NSAID for pain - Ketorolac.  DM2 (diabetes mellitus, type 2) ([place]) Patient followed for DM2. She denies use of basal insulin, though listed on her medrec. She does sliding scale coverage.  Plan Will need to confirm with her and her doctor the details of her regimen  No basal insulin at this time  Sliding scale coverage.   Crohn's disease ([place]) Stable - continue home regimen/       DVT prophylaxis: Lovenox Code Status: Full Code Family Communication: patient's husband contacted.  Disposition Plan:  home when stable  Consults called: tele-neuro -Dr. Eldridge Scot  Admission status: Observation, Telemetry bed   Adella Hare, MD [facility] 08/19/2021, 12:00 AM

## 2021-08-18 NOTE — Subjective & Objective (Signed)
Joanna Ortiz, a 76 y/o, followed for DM, Crohn's was seen and evaluated in Dec '22 for HA which was unrevealing. She had been worked up by Ophthalmology for arteritis that was negative with nl ESR , CRP. She had onset of a severe HA 08/17/21 which did resolved. The morning of 08/18/21 she had recudrent severe HA and an episode of dysarthria. She presents to [facility]ED for evaluation.

## 2021-08-18 NOTE — Assessment & Plan Note (Signed)
Stable - continue home regimen/

## 2021-08-18 NOTE — ED Triage Notes (Signed)
Pt reports she had a severe HA last night and this morning she had trouble finding her words and slurred speech. Pt reports symptoms started around 10am. Pt denies blood thinners. Pt still endorses HA and pressure behind her eyes.

## 2021-08-19 ENCOUNTER — Encounter: Payer: Self-pay | Admitting: Internal Medicine

## 2021-08-19 ENCOUNTER — Observation Stay: Payer: Federal, State, Local not specified - PPO

## 2021-08-19 ENCOUNTER — Other Ambulatory Visit: Payer: Self-pay

## 2021-08-19 DIAGNOSIS — R471 Dysarthria and anarthria: Secondary | ICD-10-CM | POA: Diagnosis not present

## 2021-08-19 DIAGNOSIS — K5090 Crohn's disease, unspecified, without complications: Secondary | ICD-10-CM | POA: Diagnosis not present

## 2021-08-19 LAB — CBC
HCT: 31.8 % — ABNORMAL LOW (ref 36.0–46.0)
Hemoglobin: 10.2 g/dL — ABNORMAL LOW (ref 12.0–15.0)
MCH: 28.2 pg (ref 26.0–34.0)
MCHC: 32.1 g/dL (ref 30.0–36.0)
MCV: 87.8 fL (ref 80.0–100.0)
Platelets: 165 10*3/uL (ref 150–400)
RBC: 3.62 MIL/uL — ABNORMAL LOW (ref 3.87–5.11)
RDW: 14.7 % (ref 11.5–15.5)
WBC: 7.2 10*3/uL (ref 4.0–10.5)
nRBC: 0 % (ref 0.0–0.2)

## 2021-08-19 LAB — LIPID PANEL
Cholesterol: 106 mg/dL (ref 0–200)
HDL: 26 mg/dL — ABNORMAL LOW (ref >40)
LDL Cholesterol: 47 mg/dL (ref 0–99)
Total CHOL/HDL Ratio: 4.1 RATIO
Triglycerides: 164 mg/dL — ABNORMAL HIGH (ref <150)
VLDL: 33 mg/dL (ref 0–40)

## 2021-08-19 LAB — BASIC METABOLIC PANEL
Anion gap: 6 (ref 5–15)
BUN: 21 mg/dL (ref 8–23)
CO2: 25 mmol/L (ref 22–32)
Calcium: 6.8 mg/dL — ABNORMAL LOW (ref 8.9–10.3)
Chloride: 111 mmol/L (ref 98–111)
Creatinine, Ser: 0.81 mg/dL (ref 0.44–1.00)
GFR, Estimated: >60 mL/min (ref >60)
Glucose, Bld: 125 mg/dL — ABNORMAL HIGH (ref 70–99)
Potassium: 3 mmol/L — ABNORMAL LOW (ref 3.5–5.1)
Sodium: 142 mmol/L (ref 135–145)

## 2021-08-19 LAB — APTT: aPTT: 30 seconds (ref 24–36)

## 2021-08-19 LAB — PROTIME-INR
INR: 1.1 (ref 0.8–1.2)
Prothrombin Time: 14.3 seconds (ref 11.4–15.2)

## 2021-08-19 LAB — ETHANOL: Alcohol, Ethyl (B): <10 mg/dL (ref <10)

## 2021-08-19 LAB — PHOSPHORUS: Phosphorus: 4.5 mg/dL (ref 2.5–4.6)

## 2021-08-19 LAB — SEDIMENTATION RATE: Sed Rate: 17 mm/hr (ref 0–30)

## 2021-08-19 LAB — MAGNESIUM: Magnesium: 0.9 mg/dL — CL (ref 1.7–2.4)

## 2021-08-19 LAB — CBG MONITORING, ED
Glucose-Capillary: 119 mg/dL — ABNORMAL HIGH (ref 70–99)
Glucose-Capillary: 149 mg/dL — ABNORMAL HIGH (ref 70–99)

## 2021-08-19 LAB — GLUCOSE, CAPILLARY: Glucose-Capillary: 120 mg/dL — ABNORMAL HIGH (ref 70–99)

## 2021-08-19 LAB — C-REACTIVE PROTEIN: CRP: 0.7 mg/dL (ref <1.0)

## 2021-08-19 MED ORDER — POTASSIUM CHLORIDE CRYS ER 20 MEQ PO TBCR
40.0000 meq | EXTENDED_RELEASE_TABLET | Freq: Two times a day (BID) | ORAL | Status: AC
  Administered 2021-08-19 (×2): 40 meq via ORAL
  Filled 2021-08-19 (×2): qty 2

## 2021-08-19 MED ORDER — BUTALBITAL-APAP-CAFFEINE 50-325-40 MG PO TABS
1.0000 | ORAL_TABLET | Freq: Four times a day (QID) | ORAL | Status: DC | PRN
  Administered 2021-08-19: 1 via ORAL
  Filled 2021-08-19: qty 1

## 2021-08-19 MED ORDER — GADOBUTROL 1 MMOL/ML IV SOLN
8.0000 mL | Freq: Once | INTRAVENOUS | Status: AC | PRN
Start: 2021-08-19 — End: 2021-08-19
  Administered 2021-08-19: 8 mL via INTRAVENOUS

## 2021-08-19 MED ORDER — ONDANSETRON HCL 4 MG/2ML IJ SOLN
4.0000 mg | Freq: Four times a day (QID) | INTRAMUSCULAR | Status: DC | PRN
  Administered 2021-08-19: 4 mg via INTRAVENOUS
  Filled 2021-08-19: qty 2

## 2021-08-19 MED ORDER — ALBUTEROL SULFATE (2.5 MG/3ML) 0.083% IN NEBU: 2.5000 mg | INHALATION_SOLUTION | RESPIRATORY_TRACT | Status: DC | PRN

## 2021-08-19 MED ORDER — MAGNESIUM SULFATE 4 GM/100ML IV SOLN
4.0000 g | Freq: Once | INTRAVENOUS | Status: AC
  Administered 2021-08-19: 4 g via INTRAVENOUS
  Filled 2021-08-19: qty 100

## 2021-08-19 MED ORDER — CALCIUM GLUCONATE-NACL 2-0.675 GM/100ML-% IV SOLN
2.0000 g | Freq: Once | INTRAVENOUS | Status: AC
Start: 2021-08-19 — End: 2021-08-19
  Administered 2021-08-19: 2000 mg via INTRAVENOUS
  Filled 2021-08-19: qty 100

## 2021-08-19 NOTE — Progress Notes (Signed)
PHARMACIST - PHYSICIAN ORDER COMMUNICATION  CONCERNING: P&T Medication Policy on Herbal Medications  DESCRIPTION:  This patient's order for:  Tumeric 500 mg Capsules  has been noted.  This product(s) is classified as an "herbal" or natural product. Due to a lack of definitive safety studies or FDA approval, nonstandard manufacturing practices, plus the potential risk of unknown drug-drug interactions while on inpatient medications, the Pharmacy and Therapeutics Committee does not permit the use of "herbal" or natural products of this type within [facility].   ACTION TAKEN: The pharmacy department is unable to verify this order at this time. Please reevaluate patient's clinical condition at discharge and address if the herbal or natural product(s) should be resumed at that time.   Renda Rolls, PharmD, [facility] 08/19/2021 12:47 AM

## 2021-08-19 NOTE — ED Notes (Signed)
Critical Result: Magnesium 0.9  Jimmye Norman, MD aware

## 2021-08-19 NOTE — ED Notes (Signed)
Pt taken to MRI  

## 2021-08-19 NOTE — Progress Notes (Signed)
PROGRESS NOTE    Joanna Ortiz  AOZ:[id number] DOB: [birth year] DOA: 08/18/2021 PCP: Sharyne Peach, MD   Assessment & Plan:   Principal Problem:   Dysarthria Active Problems:   Temporal headache   Crohn's disease ([place])  Assessment and Plan: Dysarthria: etiology unclear. CT head & MRI brain shows no acute intracranial findings   Hypocalcemia: Ca gluconate ordered. Secondary to chronic diarrhea from Crohns   Hypomagnesemia: mg sulfate ordered. Secondary to chronic diarrhea from Crohns  Hypokalemia: potassium ordered. Secondary to chronic diarrhea from Crohns              Temporal headache: etiology unclear. Previously r/o for temporal arteritis. ESR is WNL, CRP is pending. Toradol, fioricet prn    DM2: likely poorly controlled. Continue on SSI w/ accuchecks   Crohn's disease: continue on home dose of budesonide      DVT prophylaxis: lovenox  Code Status: full  Family Communication: discussed pt's care w/ pt's family and answered their questions  Disposition Plan: likely d/c back home   Level of care: Telemetry Medical  Status is: Observation The patient remains OBS appropriate and will d/c before 2 midnights.   Consultants:    Procedures:   Antimicrobials:  Subjective: Pt c/o numbness/tingling of her face & feet   Objective: Vitals:   08/18/21 2230 08/19/21 0130 08/19/21 0500 08/19/21 0800  BP: 123/68 (!) 119/55 (!) 154/76 132/77  Pulse: 60 (!) 56 68 76  Resp: _0 Temp:      TempSrc:      SpO2: 96% 93% 95% 93%  Weight:      Height:       No intake or output data in the 24 hours ending 08/19/21 0806 Filed Weights   08/18/21 1504  Weight: 77 kg    Examination:  General exam: Appears calm and comfortable  Respiratory system: Clear to auscultation. Respiratory effort normal. Cardiovascular system: S1 & S2 +. No rubs, gallops or clicks.  Gastrointestinal system: Abdomen is nondistended, soft and nontender.Normal bowel sounds  heard. Central nervous system: Alert and oriented. Moves all extremities  Psychiatry: Judgement and insight appear normal. Mood & affect appropriate.     Data Reviewed: I have personally reviewed following labs and imaging studies  CBC: Recent Labs  Lab 08/18/21 1508  WBC 8.1  NEUTROABS 4.8  HGB 11.0*  HCT 34.5*  MCV 88.7  PLT 962   Basic Metabolic Panel: Recent Labs  Lab 08/18/21 1508  NA 143  K 3.1*  CL 110  CO2 25  GLUCOSE 152*  BUN 16  CREATININE 0.85  CALCIUM 7.5*   GFR: Estimated Creatinine Clearance: 61.4 mL/min (by C-G formula based on SCr of 0.85 mg/dL). Liver Function Tests: Recent Labs  Lab 08/18/21 1508  AST 26  ALT 25  ALKPHOS 87  BILITOT 0.4  PROT 6.6  ALBUMIN 3.3*   No results for input(s): "LIPASE", "AMYLASE" in the last 168 hours. No results for input(s): "AMMONIA" in the last 168 hours. Coagulation Profile: Recent Labs  Lab 08/19/21 0554  INR 1.1   Cardiac Enzymes: No results for input(s): "CKTOTAL", "CKMB", "CKMBINDEX", "TROPONINI" in the last 168 hours. BNP (last 3 results) No results for input(s): "PROBNP" in the last 8760 hours. HbA1C: No results for input(s): "HGBA1C" in the last 72 hours. CBG: Recent Labs  Lab 08/19/21 0752  GLUCAP 119*   Lipid Profile: Recent Labs    08/19/21 0550  CHOL 106  HDL 26*  LDLCALC 47  TRIG 164*  CHOLHDL 4.1   Thyroid Function Tests: No results for input(s): "TSH", "T4TOTAL", "FREET4", "T3FREE", "THYROIDAB" in the last 72 hours. Anemia Panel: No results for input(s): "VITAMINB12", "FOLATE", "FERRITIN", "TIBC", "IRON", "RETICCTPCT" in the last 72 hours. Sepsis Labs: No results for input(s): "PROCALCITON", "LATICACIDVEN" in the last 168 hours.  No results found for this or any previous visit (from the past 240 hour(s)).       Radiology Studies: MR BRAIN W WO CONTRAST  Result Date: 08/19/2021 CLINICAL DATA:  Initial evaluation for acute TIA. EXAM: MRI HEAD WITHOUT AND WITH  CONTRAST TECHNIQUE: Multiplanar, multiecho pulse sequences of the brain and surrounding structures were obtained without and with intravenous contrast. CONTRAST:  38m GADAVIST GADOBUTROL 1 MMOL/ML IV SOLN COMPARISON:  Prior CT from 08/18/2021. FINDINGS: Brain: Age-related cerebral atrophy. Minimal chronic microvascular ischemic disease noted involving the supratentorial cerebral white matter. No evidence for acute or subacute infarct. Gray-white matter differentiation maintained. No areas of chronic cortical infarction. No acute or chronic intracranial blood products. No mass lesion, mass effect or midline shift. No hydrocephalus or extra-axial fluid collection. Pituitary gland suprasellar region within normal limits. No abnormal enhancement. Vascular: Major intracranial vascular flow voids are maintained. Skull and upper cervical spine: Craniocervical junction within normal limits. Bone marrow signal intensity normal. No scalp soft tissue abnormality. Sinuses/Orbits: Prior bilateral ocular lens replacement. Scattered mucosal thickening noted within the ethmoidal air cells and maxillary sinuses. Trace bilateral mastoid effusions, of doubtful significance. Visualized nasopharynx within normal limits. Other: None. IMPRESSION: 1. No acute intracranial abnormality. 2. Mild age-related cerebral atrophy with chronic small vessel ischemic disease. Electronically Signed   By: BJeannine BogaM.D.   On: 08/19/2021 03:52   CT HEAD WO CONTRAST  Result Date: 08/18/2021 CLINICAL DATA:  Stroke, follow up EXAM: CT HEAD WITHOUT CONTRAST TECHNIQUE: Contiguous axial images were obtained from the base of the skull through the vertex without intravenous contrast. RADIATION DOSE REDUCTION: This exam was performed according to the departmental dose-optimization program which includes automated exposure control, adjustment of the mA and/or kV according to patient size and/or use of iterative reconstruction technique. COMPARISON:   December 2022 FINDINGS: Brain: There is no acute intracranial hemorrhage, mass effect, or edema. Gray-white differentiation is preserved. There is no extra-axial fluid collection. Ventricles and sulci are stable in size and configuration. Vascular: There is atherosclerotic calcification at the skull base. Skull: Calvarium is unremarkable. Sinuses/Orbits: No acute finding. Other: None. IMPRESSION: No acute intracranial hemorrhage or evidence of acute infarction. Electronically Signed   By: PMacy MisM.D.   On: 08/18/2021 15:56        Scheduled Meds:  budesonide  9 mg Oral Daily   cholestyramine light  1 packet Oral Daily   [START ON 09/07/2021] cyanocobalamin  1,000 mcg Intramuscular Q30 days   enoxaparin (LOVENOX) injection  40 mg Subcutaneous QB34L  folic acid  1 mg Oral Daily   insulin aspart  0-15 Units Subcutaneous TID WC   levothyroxine  100 mcg Oral Q0600   loratadine  10 mg Oral Daily   magnesium oxide  400 mg Oral Daily   rifaximin  550 mg Oral TID   thyroid  30 mg Oral Q0600   Continuous Infusions:  sodium chloride 50 mL/hr at 08/19/21 0253     LOS: 0 days    Time spent: 35 mins     JWyvonnia Dusky MD [facility] Pager [phone]  If 7PM-7AM, please contact night-coverage 08/19/2021, 8:06 AM

## 2021-08-19 NOTE — ED Notes (Signed)
Lunch tray at bedside. ?

## 2021-08-19 NOTE — Assessment & Plan Note (Signed)
Patient followed for DM2. She denies use of basal insulin, though listed on her medrec. She does sliding scale coverage.  Plan Will need to confirm with her and her doctor the details of her regimen  No basal insulin at this time  Sliding scale coverage.

## 2021-08-19 NOTE — ED Notes (Signed)
Breakfast tray at bedside 

## 2021-08-20 DIAGNOSIS — K5090 Crohn's disease, unspecified, without complications: Secondary | ICD-10-CM | POA: Diagnosis not present

## 2021-08-20 LAB — CBC
HCT: 33 % — ABNORMAL LOW (ref 36.0–46.0)
Hemoglobin: 10.6 g/dL — ABNORMAL LOW (ref 12.0–15.0)
MCH: 28.3 pg (ref 26.0–34.0)
MCHC: 32.1 g/dL (ref 30.0–36.0)
MCV: 88.2 fL (ref 80.0–100.0)
Platelets: 161 10*3/uL (ref 150–400)
RBC: 3.74 MIL/uL — ABNORMAL LOW (ref 3.87–5.11)
RDW: 14.6 % (ref 11.5–15.5)
WBC: 7.2 10*3/uL (ref 4.0–10.5)
nRBC: 0 % (ref 0.0–0.2)

## 2021-08-20 LAB — MAGNESIUM: Magnesium: 1.8 mg/dL (ref 1.7–2.4)

## 2021-08-20 LAB — BASIC METABOLIC PANEL
Anion gap: 5 (ref 5–15)
BUN: 15 mg/dL (ref 8–23)
CO2: 26 mmol/L (ref 22–32)
Calcium: 7.3 mg/dL — ABNORMAL LOW (ref 8.9–10.3)
Chloride: 109 mmol/L (ref 98–111)
Creatinine, Ser: 0.71 mg/dL (ref 0.44–1.00)
GFR, Estimated: >60 mL/min (ref >60)
Glucose, Bld: 114 mg/dL — ABNORMAL HIGH (ref 70–99)
Potassium: 3.7 mmol/L (ref 3.5–5.1)
Sodium: 140 mmol/L (ref 135–145)

## 2021-08-20 LAB — GLUCOSE, CAPILLARY
Glucose-Capillary: 119 mg/dL — ABNORMAL HIGH (ref 70–99)
Glucose-Capillary: 151 mg/dL — ABNORMAL HIGH (ref 70–99)

## 2021-08-20 LAB — PHOSPHORUS: Phosphorus: 3.6 mg/dL (ref 2.5–4.6)

## 2021-08-20 MED ORDER — CALCIUM GLUCONATE-NACL 2-0.675 GM/100ML-% IV SOLN
2.0000 g | Freq: Once | INTRAVENOUS | Status: AC
Start: 2021-08-20 — End: 2021-08-20
  Administered 2021-08-20: 2000 mg via INTRAVENOUS
  Filled 2021-08-20: qty 100

## 2021-08-20 MED ORDER — MECLIZINE HCL 25 MG PO TABS
25.0000 mg | ORAL_TABLET | Freq: Two times a day (BID) | ORAL | Status: DC
  Administered 2021-08-20: 25 mg via ORAL
  Filled 2021-08-20: qty 1

## 2021-08-20 MED ORDER — MECLIZINE HCL 25 MG PO TABS: 25.0000 mg | ORAL_TABLET | Freq: Two times a day (BID) | ORAL | 0 refills | Status: AC | PRN

## 2021-08-20 MED ORDER — MAGNESIUM SULFATE 2 GM/50ML IV SOLN
2.0000 g | Freq: Once | INTRAVENOUS | Status: AC
  Administered 2021-08-20: 2 g via INTRAVENOUS
  Filled 2021-08-20: qty 50

## 2021-08-20 NOTE — Discharge Summary (Signed)
Physician Discharge Summary  Joanna Ortiz QMG:[id number] DOB: [birth year] DOA: 08/18/2021  PCP: Sharyne Peach, MD  Admit date: 08/18/2021 Discharge date: 08/20/2021  Admitted From: home  Disposition:  home   Recommendations for Outpatient Follow-up:  Follow up with PCP in 1 week   Home Health: no Equipment/Devices:  Discharge Condition: stable  CODE STATUS: full  Diet recommendation: Carb Modified  Brief/Interim Summary: HPI was taken from Dr. Linda Hedges: Joanna Ortiz, a 76 y/o, followed for DM, Crohn's was seen and evaluated in Dec '22 for HA which was unrevealing. She had been worked up by Ophthalmology for arteritis that was negative with nl ESR , CRP. She had onset of a severe HA 08/17/21 which did resolved. The morning of 08/18/21 she had recudrent severe HA and an episode of dysarthria. She presents to [facility] for evaluation.     ED Course: Vitals stable. Per DPR - non-focal exam. Code stroke initiated. Tele-neuro eval, Dr. Ramon Dredge, recommended admission and MRI brain w/wo contrast.. Labs remarkable for glucose 152, Albumin 3.3. U/A negative, CT head negative for acute changes. [facility] called to admit to continue evaluation for TIA.    As per Dr. Jimmye Norman 7/1-08/20/21: Pt presented w/ dysarthria of unknown etiology. CT head & MRI brain both showed no acute intracranial findings. The dysarthria had resolved prior to d/c. Pt also c/o temporal headaches that improved some what with tylenol. ESR & CRP were both WNL.Of note, pt has poorly controlled Crohns and has chronic diarrhea which results in electrolyte abnormalities. Pt was give potassium, calcium & magnesium while in the hospital to repleat the electrolyte deficiencies.   Discharge Diagnoses:  Principal Problem:   Dysarthria Active Problems:   Temporal headache   Crohn's disease ([place])  Dysarthria: etiology unclear. CT head & MRI brain shows no acute intracranial findings. Resolved  Hypocalcemia: Ca gluconate given. Secondary to  chronic diarrhea from Crohns   Hypomagnesemia:  mg sulfate given. Secondary to chronic diarrhea from Crohns  Hypokalemia: WNL today               Temporal headache: etiology unclear. Previously r/o for temporal arteritis. ESR, CRP are both WNL. Toradol, fioricet prn    DM2: likely poorly controlled. Continue on SSI w/ accuchecks   Crohn's disease: continue on home dose of budesonide   Discharge Instructions  Discharge Instructions     Diet Carb Modified   Complete by: As directed    Discharge instructions   Complete by: As directed    F/u w/ PCP in 1 week. If you continue to have headaches, ask for a referral from your PCP to see a neurologist for the chronic headaches   Increase activity slowly   Complete by: As directed       Allergies as of 08/20/2021       Reactions   Erythromycin Hives   Hydromorphone Hives   Cefuroxime Nausea And Vomiting   Ciprocinonide [fluocinolone]    Muscle pain   Codeine    Other reaction(s): Vomiting   Floxin [ofloxacin] Nausea And Vomiting   Keflex [cephalexin] Nausea And Vomiting   Levofloxacin Nausea And Vomiting   Sulfa Antibiotics Nausea And Vomiting   Ciprofloxacin Nausea And Vomiting        Medication List     STOP taking these medications    AeroChamber Plus inhaler   fluticasone 50 MCG/ACT nasal spray Commonly known as: FLONASE   HumaLOG Mix 50/50 KwikPen (50-50) 100 UNIT/ML Kwikpen Generic drug: Insulin Lispro Prot & Lispro  Na Sulfate-K Sulfate-Mg Sulf 17.5-3.13-1.6 GM/177ML Soln       TAKE these medications    acetaminophen 500 MG tablet Commonly known as: TYLENOL Take 1,000 mg by mouth every 8 (eight) hours as needed for mild pain or moderate pain.   Adalimumab 40 MG/0.4ML Pskt Inject 40 mg into the skin every 14 (fourteen) days.   albuterol 108 (90 Base) MCG/ACT inhaler Commonly known as: VENTOLIN HFA Inhale 1-2 puffs into the lungs every 4 (four) hours as needed for wheezing or shortness of breath.    aspirin EC 81 MG tablet Take 81 mg by mouth daily. Swallow whole.   budesonide 3 MG 24 hr capsule Commonly known as: ENTOCORT EC Take 9 mg by mouth daily.   butalbital-acetaminophen-caffeine 50-325-40 MG tablet Commonly known as: FIORICET Take 1 tablet by mouth every 6 (six) hours as needed for headache.   cholestyramine light 4 g packet Commonly known as: PREVALITE Take 1 packet by mouth daily.   CINNAMON PO Take 1,200 mg by mouth daily.   cyanocobalamin 1000 MCG/ML injection Commonly known as: (VITAMIN B-12) Inject 1,000 mcg into the muscle every 30 (thirty) days.   folic acid 1 MG tablet Commonly known as: FOLVITE Take 1 mg by mouth daily.   insulin glargine 100 UNIT/ML Solostar Pen Commonly known as: LANTUS Inject 10 Units into the skin daily.   Insulin Lispro w/ Trans Port 100 UNIT/ML Sopn SSI  2 units 150-200 4 units-201-250 6 units 251-300 8 units 301-350 10 units-351-400 >400 call MD   loratadine 10 MG tablet Commonly known as: CLARITIN Take 10 mg by mouth daily.   magnesium oxide 400 MG tablet Commonly known as: MAG-OX Take 400 mg by mouth daily.   meclizine 25 MG tablet Commonly known as: ANTIVERT Take 1 tablet (25 mg total) by mouth 2 (two) times daily as needed for dizziness.   methotrexate 2.5 MG tablet Commonly known as: RHEUMATREX Take 10 mg by mouth once a week.   Synthroid 100 MCG tablet Generic drug: levothyroxine Take 100 mcg by mouth daily before breakfast.   Theratears 0.25 % Soln Generic drug: Carboxymethylcellulose Sodium Place 1 drop into both eyes 3 (three) times daily as needed (dry/irritated eyes.).   thyroid 30 MG tablet Commonly known as: ARMOUR Take 30 mg by mouth daily before breakfast.   Turmeric 500 MG Caps Take 500 mg by mouth daily.   vitamin C 1000 MG tablet Take 1,000 mg by mouth 3 (three) times a week.   Xifaxan 550 MG Tabs tablet Generic drug: rifaximin Take 550 mg by mouth 3 (three) times daily.                Durable Medical Equipment  (From admission, onward)           Start     Ordered   08/20/21 1055  For home use only DME Walker rolling  Once       Question Answer Comment  Walker: With 5 Inch Wheels   Patient needs a walker to treat with the following condition Difficulty walking      08/20/21 1054            Allergies  Allergen Reactions   Erythromycin Hives   Hydromorphone Hives   Cefuroxime Nausea And Vomiting   Ciprocinonide [Fluocinolone]     Muscle pain    Codeine     Other reaction(s): Vomiting   Floxin [Ofloxacin] Nausea And Vomiting   Keflex [Cephalexin] Nausea And Vomiting   Levofloxacin  Nausea And Vomiting   Sulfa Antibiotics Nausea And Vomiting   Ciprofloxacin Nausea And Vomiting    Consultations:    Procedures/Studies: MR BRAIN W WO CONTRAST  Result Date: 08/19/2021 CLINICAL DATA:  Initial evaluation for acute TIA. EXAM: MRI HEAD WITHOUT AND WITH CONTRAST TECHNIQUE: Multiplanar, multiecho pulse sequences of the brain and surrounding structures were obtained without and with intravenous contrast. CONTRAST:  54m GADAVIST GADOBUTROL 1 MMOL/ML IV SOLN COMPARISON:  Prior CT from 08/18/2021. FINDINGS: Brain: Age-related cerebral atrophy. Minimal chronic microvascular ischemic disease noted involving the supratentorial cerebral white matter. No evidence for acute or subacute infarct. Gray-white matter differentiation maintained. No areas of chronic cortical infarction. No acute or chronic intracranial blood products. No mass lesion, mass effect or midline shift. No hydrocephalus or extra-axial fluid collection. Pituitary gland suprasellar region within normal limits. No abnormal enhancement. Vascular: Major intracranial vascular flow voids are maintained. Skull and upper cervical spine: Craniocervical junction within normal limits. Bone marrow signal intensity normal. No scalp soft tissue abnormality. Sinuses/Orbits: Prior bilateral ocular lens  replacement. Scattered mucosal thickening noted within the ethmoidal air cells and maxillary sinuses. Trace bilateral mastoid effusions, of doubtful significance. Visualized nasopharynx within normal limits. Other: None. IMPRESSION: 1. No acute intracranial abnormality. 2. Mild age-related cerebral atrophy with chronic small vessel ischemic disease. Electronically Signed   By: BJeannine BogaM.D.   On: 08/19/2021 03:52   CT HEAD WO CONTRAST  Result Date: 08/18/2021 CLINICAL DATA:  Stroke, follow up EXAM: CT HEAD WITHOUT CONTRAST TECHNIQUE: Contiguous axial images were obtained from the base of the skull through the vertex without intravenous contrast. RADIATION DOSE REDUCTION: This exam was performed according to the departmental dose-optimization program which includes automated exposure control, adjustment of the mA and/or kV according to patient size and/or use of iterative reconstruction technique. COMPARISON:  December 2022 FINDINGS: Brain: There is no acute intracranial hemorrhage, mass effect, or edema. Gray-white differentiation is preserved. There is no extra-axial fluid collection. Ventricles and sulci are stable in size and configuration. Vascular: There is atherosclerotic calcification at the skull base. Skull: Calvarium is unremarkable. Sinuses/Orbits: No acute finding. Other: None. IMPRESSION: No acute intracranial hemorrhage or evidence of acute infarction. Electronically Signed   By: PMacy MisM.D.   On: 08/18/2021 15:56   (Echo, Carotid, EGD, Colonoscopy, ERCP)    Subjective: Pt denies any complaints    Discharge Exam: Vitals:   08/20/21 0502 08/20/21 0757  BP: 139/69 (!) 163/68  Pulse: 68 67  Resp: 16 18  Temp: 97.7 F (36.5 C) 97.7 F (36.5 C)  SpO2: 97% 99%   Vitals:   08/19/21 2044 08/19/21 2343 08/20/21 0502 08/20/21 0757  BP: (!) 173/77 (!) 169/76 139/69 (!) 163/68  Pulse: 75 72 68 67  Resp: _0 Temp: (!) 97.5 F (36.4 C) 97.6 F (36.4 C) 97.7  F (36.5 C) 97.7 F (36.5 C)  TempSrc:      SpO2: 97% 98% 97% 99%  Weight:      Height:        General: Pt is alert, awake, not in acute distress Cardiovascular: S1/S2 +, no rubs, no gallops Respiratory: CTA bilaterally, no wheezing, no rhonchi Abdominal: Soft, NT, ND, bowel sounds + Extremities:  no cyanosis    The results of significant diagnostics from this hospitalization (including imaging, microbiology, ancillary and laboratory) are listed below for reference.     Microbiology: No results found for this or any previous visit (from the past 240 hour(s)).  Labs: BNP (last 3 results) No results for input(s): "BNP" in the last 8760 hours. Basic Metabolic Panel: Recent Labs  Lab 08/18/21 1508 08/19/21 0819 08/20/21 0555  NA 143 142 140  K 3.1* 3.0* 3.7  CL 110 111 109  CO2 _0 GLUCOSE 152* 125* 114*  BUN _1 CREATININE 0.85 0.81 0.71  CALCIUM 7.5* 6.8* 7.3*  MG  --  0.9* 1.8  PHOS  --  4.5 3.6   Liver Function Tests: Recent Labs  Lab 08/18/21 1508  AST 26  ALT 25  ALKPHOS 87  BILITOT 0.4  PROT 6.6  ALBUMIN 3.3*   No results for input(s): "LIPASE", "AMYLASE" in the last 168 hours. No results for input(s): "AMMONIA" in the last 168 hours. CBC: Recent Labs  Lab 08/18/21 1508 08/19/21 0819 08/20/21 0555  WBC 8.1 7.2 7.2  NEUTROABS 4.8  --   --   HGB 11.0* 10.2* 10.6*  HCT 34.5* 31.8* 33.0*  MCV 88.7 87.8 88.2  PLT 186 165 161   Cardiac Enzymes: No results for input(s): "CKTOTAL", "CKMB", "CKMBINDEX", "TROPONINI" in the last 168 hours. BNP: Invalid input(s): "POCBNP" CBG: Recent Labs  Lab 08/19/21 0752 08/19/21 1120 08/19/21 1616 08/20/21 0756  GLUCAP 119* 149* 120* 119*   D-Dimer No results for input(s): "DDIMER" in the last 72 hours. Hgb A1c No results for input(s): "HGBA1C" in the last 72 hours. Lipid Profile Recent Labs    08/19/21 0550  CHOL 106  HDL 26*  LDLCALC 47  TRIG 164*  CHOLHDL 4.1   Thyroid function  studies No results for input(s): "TSH", "T4TOTAL", "T3FREE", "THYROIDAB" in the last 72 hours.  Invalid input(s): "FREET3" Anemia work up No results for input(s): "VITAMINB12", "FOLATE", "FERRITIN", "TIBC", "IRON", "RETICCTPCT" in the last 72 hours. Urinalysis    Component Value Date/Time   COLORURINE STRAW (A) 08/18/2021 2141   APPEARANCEUR CLEAR (A) 08/18/2021 2141   APPEARANCEUR Clear 03/06/2011 0952   LABSPEC 1.004 (L) 08/18/2021 2141   LABSPEC 1.002 03/06/2011 0952   PHURINE 6.0 08/18/2021 2141   GLUCOSEU NEGATIVE 08/18/2021 2141   GLUCOSEU Negative 03/06/2011 0952   HGBUR NEGATIVE 08/18/2021 2141   BILIRUBINUR NEGATIVE 08/18/2021 2141   BILIRUBINUR Negative 03/06/2011 [place] 08/18/2021 2141   PROTEINUR NEGATIVE 08/18/2021 2141   UROBILINOGEN 0.2 08/24/2020 1900   NITRITE NEGATIVE 08/18/2021 2141   LEUKOCYTESUR NEGATIVE 08/18/2021 2141   LEUKOCYTESUR Negative 03/06/2011 0952   Sepsis Labs Recent Labs  Lab 08/18/21 1508 08/19/21 0819 08/20/21 0555  WBC 8.1 7.2 7.2   Microbiology No results found for this or any previous visit (from the past 240 hour(s)).   Time coordinating discharge: Over 30 minutes  SIGNED:   Wyvonnia Dusky, MD  [facility] 08/20/2021, 11:06 AM Pager   If 7PM-7AM, please contact night-coverage [email or web address]

## 2021-08-20 NOTE — Evaluation (Signed)
Physical Therapy Evaluation Patient Details Name: Joanna Ortiz MRN: [id number] DOB: [birth year] Today's Date: 08/20/2021  History of Present Illness  Pt is a 76 y/o with PMH that includes DM and Crohn's disease. Pt had onset of a severe HA 08/17/21 which did resolve. The morning of 08/18/21 she had recurent severe HA and an episode of dysarthria. She presented to [facility] for evaluation. MD assessment includes: Dysarthria: etiology unclear. CT head & MRI brain shows no acute intracranial findings, hypocalcemia, hypomagnesemia, hypokalemia, and temporal HA.   Clinical Impression  Pt was pleasant and motivated to participate during the session and put forth good effort throughout. Pt presented with mild deficits in fine motor control to her BUEs but was equal left/right.  BLE and BUE strength and sensation to light touch and proprioception were both WNL.  Some mild balance deficits were noted per below with pt declining amb assessment without an AD secondary to feeling safer with the RW at this time.  Pt will benefit from OPPT upon discharge to safely address deficits listed in patient problem list for decreased caregiver assistance and eventual return to PLOF.         Recommendations for follow up therapy are one component of a multi-disciplinary discharge planning process, led by the attending physician.  Recommendations may be updated based on patient status, additional functional criteria and insurance authorization.  Follow Up Recommendations Outpatient PT      Assistance Recommended at Discharge Intermittent Supervision/Assistance  Patient can return home with the following  A little help with walking and/or transfers;Assist for transportation    Equipment Recommendations Rolling walker (2 wheels)  Recommendations for Other Services       Functional Status Assessment Patient has had a recent decline in their functional status and demonstrates the ability to make significant  improvements in function in a reasonable and predictable amount of time.     Precautions / Restrictions Precautions Precautions: Fall Restrictions Weight Bearing Restrictions: No      Mobility  Bed Mobility Overal bed mobility: Independent                  Transfers Overall transfer level: Modified independent Equipment used: Rolling walker (2 wheels)                    Ambulation/Gait Ambulation/Gait assistance: Supervision Gait Distance (Feet): 200 Feet Assistive device: Rolling walker (2 wheels) Gait Pattern/deviations: Step-through pattern, Decreased stride length Gait velocity: decreased     General Gait Details: Mildy reduced cadence but steady without LOB with use of RW; pt reported feeling more comfortable with the RW and declined amb assessment without AD  Stairs Stairs: Yes Stairs assistance: Supervision Stair Management: One rail Right, Forwards, Alternating pattern Number of Stairs: 4 General stair comments: Slow cadence but steady without LOB  Wheelchair Mobility    Modified Rankin (Stroke Patients Only)       Balance Overall balance assessment: Needs assistance   Sitting balance-Leahy Scale: Normal     Standing balance support: No upper extremity supported Standing balance-Leahy Scale: Fair Standing balance comment: (-) Romberg sign; min instability with head turns and feet together but able to self correct Single Leg Stance - Right Leg: 8 Single Leg Stance - Left Leg: 5                         Pertinent Vitals/Pain Pain Assessment Pain Assessment: No/denies pain    Home Living Family/patient expects to  be discharged to:: Private residence Living Arrangements: Spouse/significant other Available Help at Discharge: Family;Available 24 hours/day Type of Home: House Home Access: Stairs to enter Entrance Stairs-Rails: Right;Left (Can not reach both) Entrance Stairs-Number of Steps: 3   Home Layout: One level Home  Equipment: None      Prior Function Prior Level of Function : Independent/Modified Independent             Mobility Comments: Ind amb community distances without an AD, no fall history, active, gardens ADLs Comments: Ind with ADLs     Hand Dominance   Dominant Hand: Left    Extremity/Trunk Assessment   Upper Extremity Assessment Upper Extremity Assessment: Overall WFL for tasks assessed;RUE deficits/detail;LUE deficits/detail RUE Deficits / Details: RUE strength grossly WNL RUE Sensation: WNL RUE Coordination: WNL (fine motor minimally decreased but equal L/R) LUE Deficits / Details: LUE strength grossly WNL LUE Sensation: WNL LUE Coordination: WNL (fine motor minimally decreased but equal L/R)    Lower Extremity Assessment Lower Extremity Assessment: Overall WFL for tasks assessed;RLE deficits/detail;LLE deficits/detail RLE Deficits / Details: RLE strength grossly WNL RLE Sensation: WNL RLE Coordination: WNL LLE Deficits / Details: LLE strength grossly WNL LLE Sensation: WNL LLE Coordination: WNL       Communication   Communication: No difficulties  Cognition Arousal/Alertness: Awake/alert Behavior During Therapy: WFL for tasks assessed/performed Overall Cognitive Status: Within Functional Limits for tasks assessed                                          General Comments      Exercises     Assessment/Plan    PT Assessment Patient needs continued PT services  PT Problem List Decreased balance       PT Treatment Interventions DME instruction;Gait training;Stair training;Functional mobility training;Therapeutic activities;Therapeutic exercise;Balance training;Patient/family education    PT Goals (Current goals can be found in the Care Plan section)  Acute Rehab PT Goals Patient Stated Goal: Improved balance PT Goal Formulation: With patient Time For Goal Achievement: 09/02/21 Potential to Achieve Goals: Good    Frequency Min  2X/week     Co-evaluation               AM-PAC PT "6 Clicks" Mobility  Outcome Measure Help needed turning from your back to your side while in a flat bed without using bedrails?: None Help needed moving from lying on your back to sitting on the side of a flat bed without using bedrails?: None Help needed moving to and from a bed to a chair (including a wheelchair)?: None Help needed standing up from a chair using your arms (e.g., wheelchair or bedside chair)?: None Help needed to walk in hospital room?: A Little Help needed climbing 3-5 steps with a railing? : A Little 6 Click Score: 22    End of Session Equipment Utilized During Treatment: Gait belt Activity Tolerance: Patient tolerated treatment well Patient left: in chair;with call bell/phone within reach;with chair alarm set;with family/visitor present Nurse Communication: Mobility status PT Visit Diagnosis: Difficulty in walking, not elsewhere classified (R26.2)    Time: 1010-1044 PT Time Calculation (min) (ACUTE ONLY): 34 min   Charges:   PT Evaluation $PT Eval Moderate Complexity: 1 Mod          D. Scott Dynasty Holquin PT, DPT 08/20/21, 12:03 PM

## 2021-08-20 NOTE — Progress Notes (Signed)
Received Md order to discharge patient to home.  Reviewed discharge instructions, prescriptions, homes meds and follow up appointments with patient and patient verbalized understanding

## 2021-08-20 NOTE — TOC Transition Note (Addendum)
Transition of Care [facility]) - CM/SW Discharge Note   Patient Details  Name: Joanna Ortiz MRN: [id number] Date of Birth: Jun 02, 1945  [facility] CM/SW Contact:  Izola Price, RN Phone Number: 08/20/2021, 10:59 AM   Clinical Narrative: 7/2: Patient likely to be discharged today. Spoke with patient regarding discharge plan of outpatient therapy and to contact insurance provider on specifics on costs/coverage/copay. Patient verbalized understanding. RW DME ordered and requested via [employer] to be delivered to room today. Patient does not have any DME currently. Sent list to patient but she states she is already active with outpatient services via [place] with PT M. Sherk, and has a pending appointment this week coming up. Updated Unit RN and provider.  Simmie Davies RN CM      Final next level of care: OP Rehab Barriers to Discharge: Barriers Resolved   Patient Goals and CMS Choice        Discharge Placement                       Discharge Plan and Services                DME Arranged: Walker rolling DME Agency: [employer] Date DME Agency Contacted: 08/20/21 Time DME Agency Contacted: 1059 Representative spoke with at DME Agency: [place]: NA [place] Agency: NA        Social Determinants of Health ([place]) Interventions     Readmission Risk Interventions     No data to display

## 2021-08-21 LAB — HEMOGLOBIN A1C
Hgb A1c MFr Bld: 6.4 % — ABNORMAL HIGH (ref 4.8–5.6)
Mean Plasma Glucose: 137 mg/dL

## 2021-09-22 DIAGNOSIS — H43812 Vitreous degeneration, left eye: Secondary | ICD-10-CM | POA: Insufficient documentation

## 2021-10-05 ENCOUNTER — Ambulatory Visit: Payer: Self-pay

## 2021-10-10 ENCOUNTER — Observation Stay
Admission: EM | Admit: 2021-10-10 | Discharge: 2021-10-11 | Disposition: A | Discharge status: Home / Self Care | Payer: Federal, State, Local not specified - PPO | Attending: Internal Medicine | Admitting: Internal Medicine

## 2021-10-10 ENCOUNTER — Emergency Department: Payer: Federal, State, Local not specified - PPO

## 2021-10-10 ENCOUNTER — Inpatient Hospital Stay: Payer: Federal, State, Local not specified - PPO

## 2021-10-10 ENCOUNTER — Encounter: Payer: Self-pay | Admitting: Emergency Medicine

## 2021-10-10 DIAGNOSIS — Z794 Long term (current) use of insulin: Secondary | ICD-10-CM | POA: Diagnosis not present

## 2021-10-10 DIAGNOSIS — E876 Hypokalemia: Secondary | ICD-10-CM | POA: Diagnosis not present

## 2021-10-10 DIAGNOSIS — E119 Type 2 diabetes mellitus without complications: Secondary | ICD-10-CM | POA: Insufficient documentation

## 2021-10-10 DIAGNOSIS — I639 Cerebral infarction, unspecified: Secondary | ICD-10-CM | POA: Diagnosis not present

## 2021-10-10 DIAGNOSIS — G459 Transient cerebral ischemic attack, unspecified: Secondary | ICD-10-CM | POA: Diagnosis not present

## 2021-10-10 DIAGNOSIS — Z87891 Personal history of nicotine dependence: Secondary | ICD-10-CM | POA: Diagnosis not present

## 2021-10-10 DIAGNOSIS — Z79899 Other long term (current) drug therapy: Secondary | ICD-10-CM | POA: Diagnosis not present

## 2021-10-10 DIAGNOSIS — I2510 Atherosclerotic heart disease of native coronary artery without angina pectoris: Secondary | ICD-10-CM | POA: Insufficient documentation

## 2021-10-10 DIAGNOSIS — Z96612 Presence of left artificial shoulder joint: Secondary | ICD-10-CM | POA: Insufficient documentation

## 2021-10-10 DIAGNOSIS — R7401 Elevation of levels of liver transaminase levels: Secondary | ICD-10-CM | POA: Diagnosis not present

## 2021-10-10 DIAGNOSIS — E039 Hypothyroidism, unspecified: Secondary | ICD-10-CM | POA: Insufficient documentation

## 2021-10-10 DIAGNOSIS — Z7982 Long term (current) use of aspirin: Secondary | ICD-10-CM | POA: Diagnosis not present

## 2021-10-10 DIAGNOSIS — R2990 Unspecified symptoms and signs involving the nervous system: Principal | ICD-10-CM

## 2021-10-10 DIAGNOSIS — K5090 Crohn's disease, unspecified, without complications: Secondary | ICD-10-CM | POA: Diagnosis present

## 2021-10-10 DIAGNOSIS — R4701 Aphasia: Secondary | ICD-10-CM | POA: Diagnosis not present

## 2021-10-10 DIAGNOSIS — R29818 Other symptoms and signs involving the nervous system: Secondary | ICD-10-CM | POA: Diagnosis present

## 2021-10-10 DIAGNOSIS — R519 Headache, unspecified: Secondary | ICD-10-CM | POA: Diagnosis present

## 2021-10-10 LAB — COMPREHENSIVE METABOLIC PANEL
ALT: 47 U/L — ABNORMAL HIGH (ref 0–44)
AST: 32 U/L (ref 15–41)
Albumin: 3.4 g/dL — ABNORMAL LOW (ref 3.5–5.0)
Alkaline Phosphatase: 105 U/L (ref 38–126)
Anion gap: 8 (ref 5–15)
BUN: 21 mg/dL (ref 8–23)
CO2: 27 mmol/L (ref 22–32)
Calcium: 8.1 mg/dL — ABNORMAL LOW (ref 8.9–10.3)
Chloride: 103 mmol/L (ref 98–111)
Creatinine, Ser: 1.01 mg/dL — ABNORMAL HIGH (ref 0.44–1.00)
GFR, Estimated: 58 mL/min — ABNORMAL LOW (ref >60)
Glucose, Bld: 138 mg/dL — ABNORMAL HIGH (ref 70–99)
Potassium: 3.7 mmol/L (ref 3.5–5.1)
Sodium: 138 mmol/L (ref 135–145)
Total Bilirubin: 0.6 mg/dL (ref 0.3–1.2)
Total Protein: 6.4 g/dL — ABNORMAL LOW (ref 6.5–8.1)

## 2021-10-10 LAB — CBC
HCT: 36.4 % (ref 36.0–46.0)
Hemoglobin: 11.8 g/dL — ABNORMAL LOW (ref 12.0–15.0)
MCH: 29 pg (ref 26.0–34.0)
MCHC: 32.4 g/dL (ref 30.0–36.0)
MCV: 89.4 fL (ref 80.0–100.0)
Platelets: 180 10*3/uL (ref 150–400)
RBC: 4.07 MIL/uL (ref 3.87–5.11)
RDW: 14.5 % (ref 11.5–15.5)
WBC: 8.1 10*3/uL (ref 4.0–10.5)
nRBC: 0 % (ref 0.0–0.2)

## 2021-10-10 LAB — PROTIME-INR
INR: 1.1 (ref 0.8–1.2)
Prothrombin Time: 13.6 seconds (ref 11.4–15.2)

## 2021-10-10 LAB — APTT: aPTT: 28 seconds (ref 24–36)

## 2021-10-10 LAB — DIFFERENTIAL
Abs Immature Granulocytes: 0.05 10*3/uL (ref 0.00–0.07)
Basophils Absolute: 0 10*3/uL (ref 0.0–0.1)
Basophils Relative: 0 %
Eosinophils Absolute: 0.2 10*3/uL (ref 0.0–0.5)
Eosinophils Relative: 3 %
Immature Granulocytes: 1 %
Lymphocytes Relative: 50 %
Lymphs Abs: 4 10*3/uL (ref 0.7–4.0)
Monocytes Absolute: 0.6 10*3/uL (ref 0.1–1.0)
Monocytes Relative: 7 %
Neutro Abs: 3.2 10*3/uL (ref 1.7–7.7)
Neutrophils Relative %: 39 %

## 2021-10-10 LAB — CBG MONITORING, ED: Glucose-Capillary: 146 mg/dL — ABNORMAL HIGH (ref 70–99)

## 2021-10-10 LAB — ETHANOL: Alcohol, Ethyl (B): <10 mg/dL (ref <10)

## 2021-10-10 MED ORDER — LORAZEPAM 2 MG/ML IJ SOLN
2.0000 mg | Freq: Once | INTRAMUSCULAR | Status: AC
  Administered 2021-10-10: 2 mg via INTRAVENOUS
  Filled 2021-10-10: qty 1

## 2021-10-10 MED ORDER — STROKE: EARLY STAGES OF RECOVERY BOOK: Freq: Once | Status: AC

## 2021-10-10 MED ORDER — INSULIN ASPART 100 UNIT/ML IJ SOLN
0.0000 [IU] | Freq: Three times a day (TID) | INTRAMUSCULAR | Status: DC
  Administered 2021-10-11 (×2): 2 [IU] via SUBCUTANEOUS
  Filled 2021-10-10 (×2): qty 1

## 2021-10-10 MED ORDER — LEVOTHYROXINE SODIUM 50 MCG PO TABS: 100.0000 ug | ORAL_TABLET | Freq: Every day | ORAL | Status: DC

## 2021-10-10 MED ORDER — ACETAMINOPHEN 500 MG PO TABS
1000.0000 mg | ORAL_TABLET | Freq: Three times a day (TID) | ORAL | Status: DC | PRN
  Administered 2021-10-11: 1000 mg via ORAL
  Filled 2021-10-10: qty 2

## 2021-10-10 MED ORDER — ALBUTEROL SULFATE HFA 108 (90 BASE) MCG/ACT IN AERS: 1.0000 | INHALATION_SPRAY | RESPIRATORY_TRACT | Status: DC | PRN

## 2021-10-10 MED ORDER — IOHEXOL 350 MG/ML SOLN
75.0000 mL | Freq: Once | INTRAVENOUS | Status: AC | PRN
  Administered 2021-10-10: 75 mL via INTRAVENOUS

## 2021-10-10 MED ORDER — SODIUM CHLORIDE 0.9 % IV SOLN: INTRAVENOUS | Status: DC

## 2021-10-10 MED ORDER — BUDESONIDE 3 MG PO CPEP
9.0000 mg | ORAL_CAPSULE | Freq: Every day | ORAL | Status: DC
  Administered 2021-10-11: 9 mg via ORAL
  Filled 2021-10-10: qty 3

## 2021-10-10 MED ORDER — SENNOSIDES-DOCUSATE SODIUM 8.6-50 MG PO TABS: 1.0000 | ORAL_TABLET | Freq: Every evening | ORAL | Status: DC | PRN

## 2021-10-10 MED ORDER — ALBUTEROL SULFATE (2.5 MG/3ML) 0.083% IN NEBU: 2.5000 mg | INHALATION_SOLUTION | RESPIRATORY_TRACT | Status: DC | PRN

## 2021-10-10 MED ORDER — THYROID 30 MG PO TABS
30.0000 mg | ORAL_TABLET | Freq: Every day | ORAL | Status: DC
  Administered 2021-10-11: 30 mg via ORAL
  Filled 2021-10-10: qty 1

## 2021-10-10 MED ORDER — SODIUM CHLORIDE 0.9% FLUSH
3.0000 mL | Freq: Once | INTRAVENOUS | Status: AC
  Administered 2021-10-10: 3 mL via INTRAVENOUS

## 2021-10-10 MED ORDER — ENOXAPARIN SODIUM 40 MG/0.4ML IJ SOSY
40.0000 mg | PREFILLED_SYRINGE | INTRAMUSCULAR | Status: DC
  Administered 2021-10-11: 40 mg via SUBCUTANEOUS
  Filled 2021-10-10: qty 0.4

## 2021-10-10 MED ORDER — SODIUM CHLORIDE 0.9 % IV SOLN
12.5000 mg | Freq: Once | INTRAVENOUS | Status: AC
  Administered 2021-10-10: 12.5 mg via INTRAVENOUS
  Filled 2021-10-10: qty 12.5

## 2021-10-10 MED ORDER — LEVOTHYROXINE SODIUM 100 MCG PO TABS
100.0000 ug | ORAL_TABLET | Freq: Every day | ORAL | Status: DC
  Administered 2021-10-11: 100 ug via ORAL
  Filled 2021-10-10: qty 2

## 2021-10-10 MED ORDER — INSULIN ASPART 100 UNIT/ML IJ SOLN: 0.0000 [IU] | Freq: Every day | INTRAMUSCULAR | Status: DC

## 2021-10-10 MED ORDER — CHOLESTYRAMINE LIGHT 4 G PO PACK
1.0000 | PACK | Freq: Every day | ORAL | Status: DC
  Administered 2021-10-11: 1 via ORAL
  Filled 2021-10-10: qty 1

## 2021-10-10 MED ORDER — ASPIRIN 81 MG PO TBEC
81.0000 mg | DELAYED_RELEASE_TABLET | Freq: Every day | ORAL | Status: DC
  Administered 2021-10-11: 81 mg via ORAL
  Filled 2021-10-10: qty 1

## 2021-10-10 MED ORDER — LORATADINE 10 MG PO TABS
10.0000 mg | ORAL_TABLET | Freq: Every day | ORAL | Status: DC
  Administered 2021-10-11: 10 mg via ORAL
  Filled 2021-10-10: qty 1

## 2021-10-10 NOTE — Progress Notes (Addendum)
Telestroke note:  Elert 1912 - pt in CT at time of elert Pt went to sleep around 1400 today with a headache present Woke up around 1730 - slurred speech, AMS, worsening headache behind L eye Pt back from CT 1914 Dr, Cheral Marker texted at 1919 as paging system would not send page Page did appear to send at [place] Dr. Cheral Marker at bedside [place] - Dizziness present CTA added Pt back to [place] Pt back from [place] and Dr. Cheral Marker made aware Dr. Cheral Marker made aware of pts CTA results at 2019

## 2021-10-10 NOTE — H&P (Signed)
History and Physical    [name] MWU:[id number] DOB: 1945/08/16 DOA: 10/10/2021  PCP: Sharyne Peach, MD Patient coming from: Home  Chief Complaint: Weakness, word finding difficulty  HPI: Joanna Ortiz is a 76 y.o. female with medical history significant of Arthritis, ankylosing spodylitis, carotid artery disease, CAD, crohn's disease, DM2, GERD, HLD, hypothyroidism who presents for weakness and word finding difficulty.  She notes that she laid down for a nap about 2pm.  When she woke up she had right sided heaviness, trouble with balance and felt she couldn't speak well.  She also has a left sided headache under the left eye which is 7/10.  She has had chronic dizziness which is worse on lying on the right side.  She further has chronic headaches for which she has been seeing her primary doctor.  She notes no falls, no focal weakness, no tremor (though she had some of these earlier).  She has occasional nausea.  She has chronic history of rheumatological disease, but is not on remicaide or methotrexate at this time.  She has chronic crohn's disease and is treated with budesonide, cholestyramine.  Other symptoms include dry mouth.  She also notes that before lying down for her nap, she felt somewhat off.    ED Course: In the ED she had a Cr of 1.01 up from 0.7-0.8, Albumin of 3.4, mildly elevated ALT at 47. CT head showed no acute abnormality and CTA of the head and neck showed no major intracranial stenosis.  She was seen by Neurology who recommended an MRI.   Review of Systems: As per HPI otherwise all other systems reviewed and are negative.  Past Medical History:  Diagnosis Date   Anemia    Ankylosing spondylitis (HCC)    Arthritis    Bilateral carotid artery stenosis    Collagen vascular disease (HCC)    Coronary artery disease    Crohn disease ([place])    Diabetes mellitus without complication (HCC)    diet controlled   GERD (gastroesophageal reflux disease)     Hyperlipidemia    Hypothyroidism    Pneumonia    3/18   PONV (postoperative nausea and vomiting)    Rheumatoid arthritis ([place])    Thyroid disease    goiter    Past Surgical History:  Procedure Laterality Date   ABDOMINAL HYSTERECTOMY  1987   APPENDECTOMY     BREAST BIOPSY Bilateral    neg   CATARACT EXTRACTION W/PHACO Right 11/06/2016   Procedure: CATARACT EXTRACTION PHACO AND INTRAOCULAR LENS PLACEMENT ([place]);  Surgeon: Birder Robson, MD;  Location: [facility];  Service: Ophthalmology;  Laterality: Right;  [place] 00:42.6 AP% 16.7 CDE 7.14 Fluid Pack Lot # [id number]   CATARACT EXTRACTION W/PHACO Left 12/25/2016   Procedure: CATARACT EXTRACTION PHACO AND INTRAOCULAR LENS PLACEMENT ([facility]);  Surgeon: Birder Robson, MD;  Location: [facility];  Service: Ophthalmology;  Laterality: Left;  [place] 00:32 AP% 13.7 CDE 4.40 Fluid pack lot # [id number]   COLON SURGERY  06/06/2011   Resection of terminal ileum and previous ileocolonic anastomosis for recurrent Crohn's disease. 3 focal strictures identified.   COLONOSCOPY  2013   COLONOSCOPY WITH PROPOFOL N/A 07/28/2021   Procedure: COLONOSCOPY WITH PROPOFOL;  Surgeon: Lesly Rubenstein, MD;  Location: [facility];  Service: Endoscopy;  Laterality: N/A;  IDDM   JOINT REPLACEMENT Left    SHOULDER   LEFT HEART CATH AND CORONARY ANGIOGRAPHY Left 04/04/2018   Procedure: LEFT HEART CATH AND CORONARY ANGIOGRAPHY;  Surgeon: Nehemiah Massed,  Everlean Cherry, MD;  Location: [facility];  Service: Cardiovascular;  Laterality: Left;   SHOULDER SURGERY Right 11/20/2018   Procedure: R SHOULDER ARTHROSCOPY: biceps tenomyotomy, distal claviculectomy, acromioplasty, posterior capsule release, debridement, decrompression of subacromial space, coracoacromial release, bursectomy   SMALL INTESTINE SURGERY  2012   THYROID SURGERY  1992   TONSILLECTOMY      Social History  reports that she quit smoking about 52 years ago. Her smoking use included cigarettes. She has a 12.00  pack-year smoking history. She has never used smokeless tobacco. She reports that she does not drink alcohol and does not use drugs.  Allergies  Allergen Reactions   Erythromycin Hives   Hydromorphone Hives   Cefuroxime Nausea And Vomiting   Ciprocinonide [Fluocinolone]     Muscle pain    Codeine     Other reaction(s): Vomiting   Floxin [Ofloxacin] Nausea And Vomiting   Keflex [Cephalexin] Nausea And Vomiting   Levofloxacin Nausea And Vomiting   Sulfa Antibiotics Nausea And Vomiting   Ciprofloxacin Nausea And Vomiting    Family History  Problem Relation Age of Onset   Breast cancer Sister 34   Crohn's disease Mother    Heart attack Father 42    Prior to Admission medications   Medication Sig Start Date End Date Taking? Authorizing Provider  acetaminophen (TYLENOL) 500 MG tablet Take 1,000 mg by mouth every 8 (eight) hours as needed for mild pain or moderate pain.    [provider]         albuterol (VENTOLIN HFA) 108 (90 Base) MCG/ACT inhaler Inhale 1-2 puffs into the lungs every 4 (four) hours as needed for wheezing or shortness of breath. 10/21/19   Melynda Ripple, MD  Ascorbic Acid (VITAMIN C) 1000 MG tablet Take 1,000 mg by mouth 3 (three) times a week.     [provider]  aspirin EC 81 MG tablet Take 81 mg by mouth daily. Swallow whole.    [provider]  budesonide (ENTOCORT EC) 3 MG 24 hr capsule Take 9 mg by mouth daily. 08/01/21   [provider]         cholestyramine light (PREVALITE) 4 g packet Take 1 packet by mouth daily. 03/22/21   [provider]  CINNAMON PO Take 1,200 mg by mouth daily.    [provider]  cyanocobalamin (,VITAMIN B-12,) 1000 MCG/ML injection Inject 1,000 mcg into the muscle every 30 (thirty) days.    [provider]  folic acid (FOLVITE) 1 MG tablet Take 1 mg by mouth daily. 04/21/21   [provider]         Insulin Lispro w/ Trans Port 100 UNIT/ML SOPN SSI  2 units  150-200 4 units-201-250 6 units 251-300 8 units 301-350 10 units-351-400 >400 call MD 05/24/20   [provider]  loratadine (CLARITIN) 10 MG tablet Take 10 mg by mouth daily.    [provider]  magnesium oxide (MAG-OX) 400 MG tablet Take 400 mg by mouth daily.    [provider]         SYNTHROID 100 MCG tablet Take 100 mcg by mouth daily before breakfast.  04/27/14   [provider]  THERATEARS 0.25 % SOLN Place 1 drop into both eyes 3 (three) times daily as needed (dry/irritated eyes.).    [provider]  thyroid (ARMOUR) 30 MG tablet Take 30 mg by mouth daily before breakfast.     [provider]  Turmeric 500  MG CAPS Take 500 mg by mouth daily.    [provider]           Physical Exam: Vitals:   10/10/21 1921 10/10/21 2005 10/10/21 2100  BP: (!) 165/73 (!) 175/81 (!) 188/87  Pulse: 96 85 86  Resp: 20 (!) 23 18  SpO2: 100% 96% 97%    Constitutional: NAD, lying on left side, very prominent reaction to trying to roll onto the right side Eyes: EOMI, no nystagmus, lids normal ENMT: Mucous membranes are moist. Normal dentition Neck: normal, supple Respiratory: breathing comfortably on room air, no wheezing Cardiovascular: RR, NR, no murmur, no LE edema Abdomen: NT, ND, +BS Musculoskeletal: normal tone and bulk Skin: no rashes, lesions, ulcers on exposed skin Neurologic: CN 2-12 grossly intact, speech is fluent, naming is normal.  She has a faint scottish accent.  Sensation intact to light touch throughout face, arms, legs.  Strength 5/5 throughout.  She has mild tremulousness when doing Finger to nose, improved from neurology exam.  She has a impressive jerking reaction to attempt to roll and lie on the right side.  Psychiatric: Normal judgment and insight. Alert and oriented.  Fatigued.    Labs on Admission: I have personally reviewed following labs and imaging studies  CBC: Recent Labs  Lab 10/10/21 1922  WBC 8.1   NEUTROABS 3.2  HGB 11.8*  HCT 36.4  MCV 89.4  PLT 354    Basic Metabolic Panel: Recent Labs  Lab 10/10/21 1922  NA 138  K 3.7  CL 103  CO2 27  GLUCOSE 138*  BUN 21  CREATININE 1.01*  CALCIUM 8.1*    GFR: CrCl cannot be calculated (Unknown ideal weight.).  Liver Function Tests: Recent Labs  Lab 10/10/21 1922  AST 32  ALT 47*  ALKPHOS 105  BILITOT 0.6  PROT 6.4*  ALBUMIN 3.4*       Radiological Exams on Admission: CT ANGIO HEAD NECK W WO CM (CODE STROKE)  Result Date: 10/10/2021 CLINICAL DATA:  Neuro deficit, acute, stroke suspected. Altered mental status, left-sided headache, slurred speech, and dizziness. EXAM: CT ANGIOGRAPHY HEAD AND NECK TECHNIQUE: Multidetector CT imaging of the head and neck was performed using the standard protocol during bolus administration of intravenous contrast. Multiplanar CT image reconstructions and MIPs were obtained to evaluate the vascular anatomy. Carotid stenosis measurements (when applicable) are obtained utilizing NASCET criteria, using the distal internal carotid diameter as the denominator. RADIATION DOSE REDUCTION: This exam was performed according to the departmental dose-optimization program which includes automated exposure control, adjustment of the mA and/or kV according to patient size and/or use of iterative reconstruction technique. CONTRAST:  15m OMNIPAQUE IOHEXOL 350 MG/ML SOLN COMPARISON:  Head and neck CTA 02/01/2021 FINDINGS: CTA NECK FINDINGS Aortic arch: Standard 3 vessel aortic arch with mild atherosclerotic plaque. Widely patent arch vessel origins. Right carotid system: Patent with a small amount of calcified plaque at the carotid bifurcation. No evidence of a significant stenosis or dissection. Retropharyngeal course of the proximal ICA. Left carotid system: Patent with a small amount of calcified plaque at the carotid bifurcation. No evidence of a significant stenosis or dissection. Vertebral arteries: Patent  without evidence of a significant stenosis or dissection. Moderately dominant right vertebral artery. Skeleton: Moderate cervical disc degeneration. Advanced upper cervical facet arthrosis. Other neck: No evidence of cervical lymphadenopathy or mass. Upper chest: No apical lung consolidation or mass. Review of the MIP images confirms the above findings CTA HEAD FINDINGS Anterior circulation: The internal carotid  arteries are patent from skull base to carotid termini with mild atherosclerotic plaque bilaterally not resulting in significant stenosis. ACAs and MCAs are patent without evidence of a proximal branch occlusion or significant proximal stenosis. The right A1 segment is hypoplastic. No aneurysm is identified. Posterior circulation: The intracranial vertebral arteries are widely patent to the basilar. Patent bilateral PICA, right AICA, and bilateral SCA origins are visualized. The basilar artery is widely patent. There is a patent right posterior communicating artery. Both PCAs are patent without evidence of a significant proximal stenosis. No aneurysm is identified. Venous sinuses: Patent. Anatomic variants: None of significance. Review of the MIP images confirms the above findings IMPRESSION: 1. Mild atherosclerosis in the head and neck without large vessel occlusion or significant stenosis. 2. Aortic Atherosclerosis (ICD10-I70.0). Electronically Signed   By: Logan Bores M.D.   On: 10/10/2021 20:05   CT HEAD CODE STROKE WO CONTRAST  Result Date: 10/10/2021 CLINICAL DATA:  Code stroke.  Acute neurologic deficit EXAM: CT HEAD WITHOUT CONTRAST TECHNIQUE: Contiguous axial images were obtained from the base of the skull through the vertex without intravenous contrast. RADIATION DOSE REDUCTION: This exam was performed according to the departmental dose-optimization program which includes automated exposure control, adjustment of the mA and/or kV according to patient size and/or use of iterative reconstruction  technique. COMPARISON:  08/18/2021 FINDINGS: Brain: There is no mass, hemorrhage or extra-axial collection. The size and configuration of the ventricles and extra-axial CSF spaces are normal. The brain parenchyma is normal, without evidence of acute or chronic infarction. Vascular: No abnormal hyperdensity of the major intracranial arteries or dural venous sinuses. No intracranial atherosclerosis. Skull: The visualized skull base, calvarium and extracranial soft tissues are normal. Sinuses/Orbits: No fluid levels or advanced mucosal thickening of the visualized paranasal sinuses. No mastoid or middle ear effusion. The orbits are normal. ASPECTS [facility] Stroke Program Early CT Score) - Ganglionic level infarction (caudate, lentiform nuclei, internal capsule, insula, M1-M3 cortex): 7 - Supraganglionic infarction (M4-M6 cortex): 3 Total score (0-10 with 10 being normal): 10 IMPRESSION: 1. No acute intracranial abnormality. 2. ASPECTS is 10. These results were called by telephone at the time of interpretation on 10/10/2021 at 7:24 pm to provider Duffy Bruce , who verbally acknowledged these results. Electronically Signed   By: Ulyses Jarred M.D.   On: 10/10/2021 19:25    EKG: Independently reviewed. NSR, appears to have a mild block, LAFB, poor R wave progression.  No ST changes.   Assessment/Plan  TIA (transient ischemic attack) vs. Stroke - CT head and CTA head as above - MRI brain pending - neurology consulted - Medical telemetry - Stroke swallow screen - SLP if positive - PT/OT - Check lipid panel and A1C - BP is elevated, she is not apparently on treatment, monitor, start therapy with ACE or ARB if remains elevated after work up [employer] atorvastatin - TTE if MRI brain is positive, cancel otherwise.  - Neurochecks  Headache, temporal - Check sed rate - May be falsely low given chronic budesonide - Pain control with oxycodone, tylenol  Crohn's disease (HCC) - Continue home budesonide and  cholestyramine - Appears well controlled.    H/O CAD - LHC in 2020 showed multiple areas of mild stenosis, medical management - Continue aspirin - If stroke found on MRI, consider adding plavix  DM2 on SSI - SSI - Check A1C  Hypothyroidism - Continue home synthroid and armour thyroid  Elevated ALT - Trend with AM labs   DVT prophylaxis: Lovenox  Code Status:   Full  Family Communication:  Husband at bedside  Disposition Plan:   Patient is from:  Home  Anticipated DC to:  Home, possibly [place]  Anticipated DC date:  10/12/21  Anticipated DC barriers: Pending further work up  [employer] called:  Neurology, [place]  Admission status:  INP, telemetry   Severity of Illness: The appropriate patient status for this patient is INPATIENT. Inpatient status is judged to be reasonable and necessary in order to provide the required intensity of service to ensure the patient's safety. The patient's presenting symptoms, physical exam findings, and initial radiographic and laboratory data in the context of their chronic comorbidities is felt to place them at high risk for further clinical deterioration. Furthermore, it is not anticipated that the patient will be medically stable for discharge from the hospital within 2 midnights of admission.   * I certify that at the point of admission it is my clinical judgment that the patient will require inpatient hospital care spanning beyond 2 midnights from the point of admission due to high intensity of service, high risk for further deterioration and high frequency of surveillance required.Gilles Chiquito MD [facility]  How to contact the [facility] Attending or Consulting provider [place] or covering provider during after hours [place], for this patient?   Check the care team in [facility] and look for a) attending/consulting [facility] provider listed and b) the [facility] team listed Log into [email or web address] and use [place]'s universal password to access. If you do  not have the password, please contact the hospital operator. Locate the [facility] provider you are looking for under [facility] and page to a number that you can be directly reached. If you still have difficulty reaching the provider, please page the [facility] (Director on Call) for the Hospitalists listed on amion for assistance.  10/10/2021, 10:50 PM

## 2021-10-10 NOTE — ED Notes (Signed)
Pt back from CT

## 2021-10-10 NOTE — ED Provider Notes (Signed)
[facility] Provider Note    Event Date/Time   First MD Initiated Contact with Patient 10/10/21 1916     (approximate)   History   Code Stroke   HPI  Joanna Ortiz is a 76 y.o. female  with PMHx anemia, ankylosing spondylitis, carotid stenosis, collagen vascular disease, Crohn,s CAD, DM, GERD, HLD, RA, here with weakness, slurred speech and facial droop. Pt states that LKN was around 1400 when she started to take a nap. She awoke about 2 hours later with slurred speech, R facial droop, and BLE weakness with retroorbital headache. Had similar sx recently and was admitted for TIA/stroke. Pt has also had dizziness, worse lying on R side for 2 months. Sx improving by time of ED evaluation.       Physical Exam   Triage Vital Signs: ED Triage Vitals [10/10/21 1921]  Enc Vitals Group     BP (!) 165/73     Pulse Rate 96     Resp 20     Temp      Temp src      SpO2 100 %     Weight      Height      Head Circumference      Peak Flow      Pain Score      Pain Loc      Pain Edu?      Excl. in [place]?     Most recent vital signs: Vitals:   10/10/21 2100 10/10/21 2255  BP: (!) 188/87   Pulse: 86   Resp: 18   Temp:  98.5 F (36.9 C)  SpO2: 97%      General: Awake, no distress.  CV:  Good peripheral perfusion. Resp:  Normal effort.  Abd:  No distention.  Other:  Neurological Exam:  Mental Status: Alert and oriented to person, place, and time. Attention and concentration normal. Speech clear. Recent memory is intact. Cranial Nerves: Visual fields grossly intact. EOMI and PERRLA. No nystagmus noted. Facial sensation intact at forehead, maxillary cheek, and chin/mandible bilaterally. No facial asymmetry or weakness. Hearing grossly normal. Uvula is midline, and palate elevates symmetrically. Normal SCM and trapezius strength. Tongue midline without fasciculations. Motor: Muscle strength 5/5 in proximal and distal UE and LE bilaterally. No pronator  drift. Muscle tone normal. Sensation: Intact to light touch in upper and lower extremities distally bilaterally.  Gait: Deferred Coordination: Normal FTN bilaterally.     ED Results / Procedures / Treatments   Labs (all labs ordered are listed, but only abnormal results are displayed) Labs Reviewed  CBC - Abnormal; Notable for the following components:      Result Value   Hemoglobin 11.8 (*)    All other components within normal limits  COMPREHENSIVE METABOLIC PANEL - Abnormal; Notable for the following components:   Glucose, Bld 138 (*)    Creatinine, Ser 1.01 (*)    Calcium 8.1 (*)    Total Protein 6.4 (*)    Albumin 3.4 (*)    ALT 47 (*)    GFR, Estimated 58 (*)    All other components within normal limits  CBG MONITORING, ED - Abnormal; Notable for the following components:   Glucose-Capillary 146 (*)    All other components within normal limits  CBG MONITORING, ED - Abnormal; Notable for the following components:   Glucose-Capillary 135 (*)    All other components within normal limits  PROTIME-INR  APTT  DIFFERENTIAL  ETHANOL  LIPID PANEL  SEDIMENTATION RATE  COMPREHENSIVE METABOLIC PANEL  CBC     EKG Normal sinus rhythm, ventricular rate 86.  PR 194, QRS 106, QTc 506.  No acute ST elevations or depressions.  Prolonged QT interval noted.   RADIOLOGY CT head: No acute intracranial abnormality CT angio head/neck: No large vessel occlusion or significant stenosis MRI: Pending   I also independently reviewed and agree with radiologist interpretations.   PROCEDURES:  Critical Care performed: No   MEDICATIONS ORDERED IN ED: Medications  acetaminophen (TYLENOL) tablet 1,000 mg (has no administration in time range)  aspirin EC tablet 81 mg (has no administration in time range)  cholestyramine light (PREVALITE) packet 1 packet (has no administration in time range)  budesonide (ENTOCORT EC) 24 hr capsule 9 mg (has no administration in time range)  thyroid  (ARMOUR) tablet 30 mg (has no administration in time range)  loratadine (CLARITIN) tablet 10 mg (has no administration in time range)   stroke: early stages of recovery book (has no administration in time range)  0.9 %  sodium chloride infusion ( Intravenous New Bag/Given 10/11/21 0020)  senna-docusate (Senokot-S) tablet 1 tablet (has no administration in time range)  enoxaparin (LOVENOX) injection 40 mg (has no administration in time range)  insulin aspart (novoLOG) injection 0-15 Units (has no administration in time range)  insulin aspart (novoLOG) injection 0-5 Units ( Subcutaneous Not Given 10/11/21 0018)  levothyroxine (SYNTHROID) tablet 100 mcg (has no administration in time range)  albuterol (PROVENTIL) (2.5 MG/3ML) 0.083% nebulizer solution 2.5 mg (has no administration in time range)  atorvastatin (LIPITOR) tablet 40 mg (has no administration in time range)  oxyCODONE (Oxy IR/ROXICODONE) immediate release tablet 5 mg (has no administration in time range)  sodium chloride flush (NS) 0.9 % injection 3 mL (3 mLs Intravenous Given 10/10/21 2002)  iohexol (OMNIPAQUE) 350 MG/ML injection 75 mL (75 mLs Intravenous Contrast Given 10/10/21 1952)  promethazine (PHENERGAN) 12.5 mg in sodium chloride 0.9 % 50 mL IVPB (0 mg Intravenous Stopped 10/10/21 2139)  LORazepam (ATIVAN) injection 2 mg (2 mg Intravenous Given 10/10/21 2301)     IMPRESSION / MDM / ASSESSMENT AND PLAN / ED COURSE  I reviewed the triage vital signs and the nursing notes.                              Ddx:  Differential includes the following, with pertinent life- or limb-threatening emergencies considered:  TIA, CVA, possible symptomatic HTN, adverse effect of her new immunomodulator medication, anxiety  Patient's presentation is most consistent with acute presentation with potential threat to life or bodily function.  MDM:  76 yo F with PMHx Crohn's, recent TIA, here with slurred speech and weakness. Now mostly resolved. Pt  activated as a CODE STROKE and Dr. Cheral Marker at bedside for evaluation. CT head shows NAICA. DDx includes TIA vs small CVA vs possibly HTN related encephalopathy possibly related to her new immunomodulator. Will admit to medicine. Pt updated and in agreement. Labs otherwise reassuring. No significant leukocytosis or anemia. CMP is at baseline. CT head shows NAICA.   MEDICATIONS GIVEN IN ED: Medications  acetaminophen (TYLENOL) tablet 1,000 mg (has no administration in time range)  aspirin EC tablet 81 mg (has no administration in time range)  cholestyramine light (PREVALITE) packet 1 packet (has no administration in time range)  budesonide (ENTOCORT EC) 24 hr capsule 9 mg (has no administration in time range)  thyroid (ARMOUR) tablet  30 mg (has no administration in time range)  loratadine (CLARITIN) tablet 10 mg (has no administration in time range)   stroke: early stages of recovery book (has no administration in time range)  0.9 %  sodium chloride infusion ( Intravenous New Bag/Given 10/11/21 0020)  senna-docusate (Senokot-S) tablet 1 tablet (has no administration in time range)  enoxaparin (LOVENOX) injection 40 mg (has no administration in time range)  insulin aspart (novoLOG) injection 0-15 Units (has no administration in time range)  insulin aspart (novoLOG) injection 0-5 Units ( Subcutaneous Not Given 10/11/21 0018)  levothyroxine (SYNTHROID) tablet 100 mcg (has no administration in time range)  albuterol (PROVENTIL) (2.5 MG/3ML) 0.083% nebulizer solution 2.5 mg (has no administration in time range)  atorvastatin (LIPITOR) tablet 40 mg (has no administration in time range)  oxyCODONE (Oxy IR/ROXICODONE) immediate release tablet 5 mg (has no administration in time range)  sodium chloride flush (NS) 0.9 % injection 3 mL (3 mLs Intravenous Given 10/10/21 2002)  iohexol (OMNIPAQUE) 350 MG/ML injection 75 mL (75 mLs Intravenous Contrast Given 10/10/21 1952)  promethazine (PHENERGAN) 12.5 mg in  sodium chloride 0.9 % 50 mL IVPB (0 mg Intravenous Stopped 10/10/21 2139)  LORazepam (ATIVAN) injection 2 mg (2 mg Intravenous Given 10/10/21 2301)     Consults:  Neurology Hospitalist   EMR reviewed  Reviewed recent work-up and admission for TIA/CVA with Dr. Jimmye Norman June 2023     FINAL CLINICAL IMPRESSION(S) / ED DIAGNOSES   Final diagnoses:  Stroke-like symptom     Rx / DC Orders   ED Discharge Orders     None        Note:  This document was prepared using Dragon voice recognition software and may include unintentional dictation errors.   Duffy Bruce, MD 10/11/21 [place]

## 2021-10-10 NOTE — ED Notes (Signed)
Pt taken to CT.

## 2021-10-10 NOTE — Consult Note (Signed)
NEURO HOSPITALIST CONSULT NOTE   Requestig physician: Dr. Ellender Hose  Reason for Consult:Acute onset of dysarthria, right facial droop and left sided numbness  History obtained from:   Patient, Husband and Chart     HPI:                                                                                                                                          Joanna Ortiz is a 76 y.o. female with a PMHx of anemia, ankylosing spondylitis, bilateral carotid artery stenosis, collagen vascular disease, Crohn disease, CAD, DM, GERD, HLD, hypothyroidism, rheumatoid arthritis and a prior recent ED visit and Teleneurology consult to assess for acute onset of weakness, who presents to the ED today with acute onset of slurred speech, right facial droop and BLE weakness in conjunction with left retroorbital headache rated at 7/10. She was last normal at 1400 when she started to take a nap. She awoke about 2 hours later with the above symptoms.   She has also had dizziness for at least several months which is worsened by lying on her right side.   Modified Rankin score 0.   Past Medical History:  Diagnosis Date   Anemia    Ankylosing spondylitis (HCC)    Arthritis    Bilateral carotid artery stenosis    Collagen vascular disease (HCC)    Coronary artery disease    Crohn disease ([place])    Diabetes mellitus without complication (HCC)    diet controlled   GERD (gastroesophageal reflux disease)    Hyperlipidemia    Hypothyroidism    Pneumonia    3/18   PONV (postoperative nausea and vomiting)    Rheumatoid arthritis ([place])    Thyroid disease    goiter    Past Surgical History:  Procedure Laterality Date   ABDOMINAL HYSTERECTOMY  1987   APPENDECTOMY     BREAST BIOPSY Bilateral    neg   CATARACT EXTRACTION W/PHACO Right 11/06/2016   Procedure: CATARACT EXTRACTION PHACO AND INTRAOCULAR LENS PLACEMENT ([place]);  Surgeon: Birder Robson, MD;  Location: [facility] ORS;  Service:  Ophthalmology;  Laterality: Right;  [place] 00:42.6 AP% 16.7 CDE 7.14 Fluid Pack Lot # [id number]   CATARACT EXTRACTION W/PHACO Left 12/25/2016   Procedure: CATARACT EXTRACTION PHACO AND INTRAOCULAR LENS PLACEMENT ([facility]);  Surgeon: Birder Robson, MD;  Location: [facility] ORS;  Service: Ophthalmology;  Laterality: Left;  [place] 00:32 AP% 13.7 CDE 4.40 Fluid pack lot # [id number]   COLON SURGERY  06/06/2011   Resection of terminal ileum and previous ileocolonic anastomosis for recurrent Crohn's disease. 3 focal strictures identified.   COLONOSCOPY  2013   COLONOSCOPY WITH PROPOFOL N/A 07/28/2021   Procedure: COLONOSCOPY WITH PROPOFOL;  Surgeon: Lesly Rubenstein, MD;  Location: [facility] END[facility];  Service: Endoscopy;  Laterality: N/A;  IDDM   JOINT REPLACEMENT Left    SHOULDER   LEFT HEART CATH AND CORONARY ANGIOGRAPHY Left 04/04/2018   Procedure: LEFT HEART CATH AND CORONARY ANGIOGRAPHY;  Surgeon: Corey Skains, MD;  Location: [facility];  Service: Cardiovascular;  Laterality: Left;   SHOULDER SURGERY Right 11/20/2018   Procedure: R SHOULDER ARTHROSCOPY: biceps tenomyotomy, distal claviculectomy, acromioplasty, posterior capsule release, debridement, decrompression of subacromial space, coracoacromial release, bursectomy   SMALL INTESTINE SURGERY  2012   THYROID SURGERY  1992   TONSILLECTOMY      Family History  Problem Relation Age of Onset   Breast cancer Sister 70   Crohn's disease Mother    Heart attack Father 76              Social History:  reports that she quit smoking about 52 years ago. Her smoking use included cigarettes. She has a 12.00 pack-year smoking history. She has never used smokeless tobacco. She reports that she does not drink alcohol and does not use drugs.  Allergies  Allergen Reactions   Erythromycin Hives   Hydromorphone Hives   Cefuroxime Nausea And Vomiting   Ciprocinonide [Fluocinolone]     Muscle pain    Codeine     Other reaction(s): Vomiting   Floxin  [Ofloxacin] Nausea And Vomiting   Keflex [Cephalexin] Nausea And Vomiting   Levofloxacin Nausea And Vomiting   Sulfa Antibiotics Nausea And Vomiting   Ciprofloxacin Nausea And Vomiting    HOME MEDICATIONS:                                                                                                                      No current facility-administered medications on file prior to encounter.   Current Outpatient Medications on File Prior to Encounter  Medication Sig Dispense Refill   acetaminophen (TYLENOL) 500 MG tablet Take 1,000 mg by mouth every 8 (eight) hours as needed for mild pain or moderate pain.     Adalimumab 40 MG/0.4ML PSKT Inject 40 mg into the skin every 14 (fourteen) days.  (Patient not taking: Reported on 08/18/2021)     albuterol (VENTOLIN HFA) 108 (90 Base) MCG/ACT inhaler Inhale 1-2 puffs into the lungs every 4 (four) hours as needed for wheezing or shortness of breath. 1 each 0   Ascorbic Acid (VITAMIN C) 1000 MG tablet Take 1,000 mg by mouth 3 (three) times a week.      aspirin EC 81 MG tablet Take 81 mg by mouth daily. Swallow whole.     budesonide (ENTOCORT EC) 3 MG 24 hr capsule Take 9 mg by mouth daily.     butalbital-acetaminophen-caffeine (FIORICET) 50-325-40 MG tablet Take 1 tablet by mouth every 6 (six) hours as needed for headache. 20 tablet 0   cholestyramine light (PREVALITE) 4 g packet Take 1 packet by mouth daily.     CINNAMON PO Take 1,200 mg by mouth daily.     cyanocobalamin (,VITAMIN B-12,) 1000 MCG/ML injection Inject  1,000 mcg into the muscle every 30 (thirty) days.     folic acid (FOLVITE) 1 MG tablet Take 1 mg by mouth daily.     Insulin Glargine (LANTUS) 100 UNIT/ML Solostar Pen Inject 10 Units into the skin daily. 15 mL 1   Insulin Lispro w/ Trans Port 100 UNIT/ML SOPN SSI  2 units 150-200 4 units-201-250 6 units 251-300 8 units 301-350 10 units-351-400 >400 call MD     loratadine (CLARITIN) 10 MG tablet Take 10 mg by mouth daily.     magnesium  oxide (MAG-OX) 400 MG tablet Take 400 mg by mouth daily.     methotrexate (RHEUMATREX) 2.5 MG tablet Take 10 mg by mouth once a week.     SYNTHROID 100 MCG tablet Take 100 mcg by mouth daily before breakfast.   11   THERATEARS 0.25 % SOLN Place 1 drop into both eyes 3 (three) times daily as needed (dry/irritated eyes.).     thyroid (ARMOUR) 30 MG tablet Take 30 mg by mouth daily before breakfast.      Turmeric 500 MG CAPS Take 500 mg by mouth daily.     XIFAXAN 550 MG TABS tablet Take 550 mg by mouth 3 (three) times daily.       ROS:                                                                                                                                       Has back pain and left hand tingling with a pain component. Has some stated difficulty with word finding. Other symptoms as per HPI. Detailed ROS deferred in the context of acuity of presentation.    BP (!) 165/73   Pulse 96   Resp 20   SpO2 100%     General Examination:                                                                                                       Physical Exam  HEENT-  [place]/AT   Lungs- Respirations unlabored Extremities- No edema  Neurological Examination Mental Status: Awake and alert. Speech is initially with a nonphysiological stuttering quality in conjunction with pained and anxious affect, that transiently resolve with distraction with affect thereafter somewhat labile and dramatic. There is a slurred component of her speech that significantly improves with distraction but is still present. When distracted, speech is fluent with no word finding deficit or paraphasias. Comprehension intact. She is able to answer all questions regarding  her PMHx without any evidence for memory deficit.  Cranial Nerves: II: Fixates and tracks normally. Reacts to stimuli in her temporal visual fields. Pupils are equal.  III,IV, VI: Eyes conjugate at the midline and gazes normally at examiner when interviewed. EOM are  intact without nystagmus when testing horizontal gaze.  V: Reacts to touch bilaterally.  VII: Initially with decreased NL fold on the right that resolves with distraction. She contracts left side of perioral muscles slightly more than right when asked to smile, but there is no asymmetry when asked to pucker lips, after which repeat request to smile reveals a symmetric smile as well as grimace.  VIII:Hearing intact to voice IX,X: No hypophonia or hoarseness XI: Head is rotated to the left which patient states is due to vertigo triggered by normal positioning.  XII: When asked to extend tongue, it deviates sharply to the left, which does not appear typical for physiological weakness and more consistent with embellishment; after this she spontaneously turns tongue rightward to lick the corner of the right side of her mouth.  Motor: RUE and LUE able to elevate symmetrically antigravity without asymmetry; she then develops irregular coarse tremulousness of her BUE that waxes and wanes and is associated with dramatc and anxious affect; when redirected/distracted her affect and tremulousness normalize. This occurs several times during the exam. When coached to obtain maximum strength, 4+/5 strength is elicited proximally and distally.  BLE with apparent inability to elevate at hips but with encouragement, she maintains elevation without drift for > 5 seconds and resists with knee extension 5/5 bilaterally. ADF and APF with giveway weakness, maximum strength elicited is 4+/5 bilaterally. Similar to the uppers, she has transient coarse non-physiological appearing tremor of BLE intermittently together with dramatic/anxious affect.  Sensory: Temp and FT sensation intact x 4.  Deep Tendon Reflexes: 1+ bilateral patellae, achilles and brachioradialis Plantars: Right: downgoing   Left: downgoing Cerebellar: No ataxia with FNF bilaterally, but with nonphysiological appearing tremor left worse than right.  Gait:  Deferred  NIHSS: 12   Lab Results: Basic Metabolic Panel: No results for input(s): "NA", "K", "CL", "CO2", "GLUCOSE", "BUN", "CREATININE", "CALCIUM", "MG", "PHOS" in the last 168 hours.  CBC: No results for input(s): "WBC", "NEUTROABS", "HGB", "HCT", "MCV", "PLT" in the last 168 hours.  Cardiac Enzymes: No results for input(s): "CKTOTAL", "CKMB", "CKMBINDEX", "TROPONINI" in the last 168 hours.  Lipid Panel: No results for input(s): "CHOL", "TRIG", "HDL", "CHOLHDL", "VLDL", "LDLCALC" in the last 168 hours.  Imaging: No results found.   Assessment: 76 y.o. female with a PMHx of anemia, ankylosing spondylitis, bilateral carotid artery stenosis, collagen vascular disease, Crohn disease, CAD, DM, GERD, HLD, hypothyroidism, rheumatoid arthritis and a prior recent ED visit and Teleneurology consult to assess for acute onset of weakness, who presents to the ED today with acute onset of slurred speech, right facial droop and BLE weakness in conjunction with left retroorbital headache rated at 7/10. She was last normal at 1400 when she started to take a nap. She awoke about 2 hours later with the above symptoms. She has also had dizziness for at least several months which is worsened by lying on her right side.  - Exam with multiple functional attributes suggestive of conversion disorder, secondary gain or stress-related symptoms, but will need to fully rule out stroke with CTA and MRI - CT head: No acute intracranial abnormality. - EKG: Sinus rhythm; Left anterior fascicular block; Abnormal R-wave progression, early transition; Probable left ventricular hypertrophy; Anterior Q waves,  possibly due to LVH; Prolonged QT interval - TNK not indicated as she is out of the time window - Severe 7/10 headache.   Recommendations: - CTA of head and neck, STAT - MRI brain - Obtain TTE if MRI brain is positive.  - Phenergan 12.5 mg IV x 1 - Frequent neuro checks - Continue home ASA - Statin - Cardiac  telemetry - BP management. Low suspicion for acute stroke - PT/OT/Speech - Psychology consult to assess for possible recent psychosocial stressors - May need ENT consult for her dizziness if MRI brain does not reveal a structural etiology  Addendum: CTA of head and neck: Mild atherosclerosis in the head and neck without large vessel occlusion or significant stenosis. Aortic Atherosclerosis   Electronically signed: Dr. Kerney Elbe 10/10/2021, 7:14 PM

## 2021-10-10 NOTE — ED Triage Notes (Signed)
Pt arrived to ED POV with spouse who reports pt LKW when she laid down for a nap at 1400 today and woke at aprox 1730 with AMS, Lt sided HA behind Lt eye and slurred speech. Pt c/o dizziness while in CT and still has HA, slurred speech. Pt unable to life legs and drift noted to the right arm. Neurologist at bedside for full assessment.

## 2021-10-10 NOTE — ED Notes (Signed)
Called Code stroke per Caryl Pina Orsuto RN) to Karen Chafe Maudie Mercury)  LKW 14:00 Slurred speech HA and weakness

## 2021-10-10 NOTE — ED Notes (Signed)
Pt taken to MRI  

## 2021-10-11 ENCOUNTER — Other Ambulatory Visit: Payer: Self-pay

## 2021-10-11 ENCOUNTER — Inpatient Hospital Stay
Admit: 2021-10-11 | Discharge: 2021-10-11 | Disposition: A | Discharge status: Home / Self Care | Payer: Federal, State, Local not specified - PPO | Attending: Internal Medicine | Admitting: Internal Medicine

## 2021-10-11 DIAGNOSIS — R519 Headache, unspecified: Secondary | ICD-10-CM

## 2021-10-11 DIAGNOSIS — G459 Transient cerebral ischemic attack, unspecified: Secondary | ICD-10-CM | POA: Diagnosis not present

## 2021-10-11 DIAGNOSIS — E876 Hypokalemia: Secondary | ICD-10-CM

## 2021-10-11 DIAGNOSIS — I639 Cerebral infarction, unspecified: Secondary | ICD-10-CM | POA: Diagnosis present

## 2021-10-11 LAB — ECHOCARDIOGRAM COMPLETE
AR max vel: 1.79 cm2
AV Area VTI: 1.91 cm2
AV Area mean vel: 1.75 cm2
AV Mean grad: 5 mmHg
AV Peak grad: 8.3 mmHg
Ao pk vel: 1.44 m/s
Area-P 1/2: 4.54 cm2
Calc EF: 48 %
S' Lateral: 3.65 cm
Single Plane A2C EF: 45.4 %
Single Plane A4C EF: 48.8 %
Weight: 2783.09 oz

## 2021-10-11 LAB — LIPID PANEL
Cholesterol: 134 mg/dL (ref 0–200)
HDL: 31 mg/dL — ABNORMAL LOW (ref >40)
LDL Cholesterol: 86 mg/dL (ref 0–99)
Total CHOL/HDL Ratio: 4.3 RATIO
Triglycerides: 84 mg/dL (ref <150)
VLDL: 17 mg/dL (ref 0–40)

## 2021-10-11 LAB — COMPREHENSIVE METABOLIC PANEL
ALT: 43 U/L (ref 0–44)
AST: 30 U/L (ref 15–41)
Albumin: 3.3 g/dL — ABNORMAL LOW (ref 3.5–5.0)
Alkaline Phosphatase: 103 U/L (ref 38–126)
Anion gap: 6 (ref 5–15)
BUN: 18 mg/dL (ref 8–23)
CO2: 28 mmol/L (ref 22–32)
Calcium: 7.8 mg/dL — ABNORMAL LOW (ref 8.9–10.3)
Chloride: 107 mmol/L (ref 98–111)
Creatinine, Ser: 0.94 mg/dL (ref 0.44–1.00)
GFR, Estimated: >60 mL/min (ref >60)
Glucose, Bld: 145 mg/dL — ABNORMAL HIGH (ref 70–99)
Potassium: 3.4 mmol/L — ABNORMAL LOW (ref 3.5–5.1)
Sodium: 141 mmol/L (ref 135–145)
Total Bilirubin: 0.4 mg/dL (ref 0.3–1.2)
Total Protein: 6.4 g/dL — ABNORMAL LOW (ref 6.5–8.1)

## 2021-10-11 LAB — SEDIMENTATION RATE: Sed Rate: 12 mm/hr (ref 0–30)

## 2021-10-11 LAB — CBC
HCT: 36.4 % (ref 36.0–46.0)
Hemoglobin: 11.6 g/dL — ABNORMAL LOW (ref 12.0–15.0)
MCH: 28.2 pg (ref 26.0–34.0)
MCHC: 31.9 g/dL (ref 30.0–36.0)
MCV: 88.6 fL (ref 80.0–100.0)
Platelets: 168 10*3/uL (ref 150–400)
RBC: 4.11 MIL/uL (ref 3.87–5.11)
RDW: 14.4 % (ref 11.5–15.5)
WBC: 10 10*3/uL (ref 4.0–10.5)
nRBC: 0 % (ref 0.0–0.2)

## 2021-10-11 LAB — CBG MONITORING, ED
Glucose-Capillary: 122 mg/dL — ABNORMAL HIGH (ref 70–99)
Glucose-Capillary: 135 mg/dL — ABNORMAL HIGH (ref 70–99)
Glucose-Capillary: 136 mg/dL — ABNORMAL HIGH (ref 70–99)

## 2021-10-11 LAB — GLUCOSE, CAPILLARY: Glucose-Capillary: 108 mg/dL — ABNORMAL HIGH (ref 70–99)

## 2021-10-11 MED ORDER — POTASSIUM CHLORIDE CRYS ER 20 MEQ PO TBCR
40.0000 meq | EXTENDED_RELEASE_TABLET | Freq: Once | ORAL | Status: DC
Start: 2021-10-11 — End: 2021-10-11

## 2021-10-11 MED ORDER — ATORVASTATIN CALCIUM 20 MG PO TABS
40.0000 mg | ORAL_TABLET | Freq: Every day | ORAL | Status: DC
Start: 2021-10-11 — End: 2021-10-11
  Administered 2021-10-11: 40 mg via ORAL
  Filled 2021-10-11: qty 2

## 2021-10-11 MED ORDER — POTASSIUM CHLORIDE 20 MEQ PO PACK
40.0000 meq | PACK | Freq: Once | ORAL | Status: AC
  Administered 2021-10-11: 40 meq via ORAL
  Filled 2021-10-11: qty 2

## 2021-10-11 MED ORDER — ATORVASTATIN CALCIUM 40 MG PO TABS: 40.0000 mg | ORAL_TABLET | Freq: Every day | ORAL | 0 refills | Status: DC

## 2021-10-11 MED ORDER — ASPIRIN 81 MG PO TBEC: 81.0000 mg | DELAYED_RELEASE_TABLET | Freq: Every day | ORAL | 0 refills | Status: DC

## 2021-10-11 MED ORDER — OXYCODONE HCL 5 MG PO TABS: 5.0000 mg | ORAL_TABLET | Freq: Four times a day (QID) | ORAL | Status: DC | PRN

## 2021-10-11 NOTE — Hospital Course (Addendum)
Joanna Ortiz is a 76 y.o. female with medical history significant of Arthritis, ankylosing spodylitis, carotid artery disease, CAD, crohn's disease, DM2, GERD, HLD, hypothyroidism who presents for weakness and word finding difficulty.  Her symptom improved after several hours, but she has a persistent headache. Patient MRI of the brain did not show any acute stroke, neurology has seen patient and signed off.  CT angiogram neck and head did not show any significant occlusion.  Echocardiogram showed EF 50-55%.  At this point, patient has passed speech eval, able to ambulate, she is medically stable to be discharged.  We will continue aspirin and Lipitor.  She will be followed with neurology as outpatient.

## 2021-10-11 NOTE — Plan of Care (Signed)

## 2021-10-11 NOTE — Evaluation (Addendum)
Speech Language Pathology Evaluation Patient Details Name: DESERA GRAFFEO MRN: [id number] DOB: 09-29-1945 Today's Date: 10/11/2021 Time: 1100-1145 SLP Time Calculation (min) (ACUTE ONLY): 45 min  Problem List:  Patient Active Problem List   Diagnosis Date Noted   TIA (transient ischemic attack) 10/10/2021   Temporal headache 08/18/2021   Dysarthria 08/18/2021   Stable angina ([place]) 04/03/2018   Right groin pain 06/22/2016   Acute bronchitis 05/01/2016   Diastasis recti 10/29/2015   Rectal bleeding 06/10/2014   AP (abdominal pain) 12/29/2013   Crohn's disease ([place]) 12/18/2013   Past Medical History:  Past Medical History:  Diagnosis Date   Anemia    Ankylosing spondylitis (HCC)    Arthritis    Bilateral carotid artery stenosis    Collagen vascular disease (HCC)    Coronary artery disease    Crohn disease ([place])    Diabetes mellitus without complication ([place])    diet controlled   GERD (gastroesophageal reflux disease)    Hyperlipidemia    Hypothyroidism    Pneumonia    3/18   PONV (postoperative nausea and vomiting)    Rheumatoid arthritis ([place])    Thyroid disease    goiter   Past Surgical History:  Past Surgical History:  Procedure Laterality Date   ABDOMINAL HYSTERECTOMY  1987   APPENDECTOMY     BREAST BIOPSY Bilateral    neg   CATARACT EXTRACTION W/PHACO Right 11/06/2016   Procedure: CATARACT EXTRACTION PHACO AND INTRAOCULAR LENS PLACEMENT ([place]);  Surgeon: Birder Robson, MD;  Location: [facility] ORS;  Service: Ophthalmology;  Laterality: Right;  [place] 00:42.6 AP% 16.7 CDE 7.14 Fluid Pack Lot # [id number]   CATARACT EXTRACTION W/PHACO Left 12/25/2016   Procedure: CATARACT EXTRACTION PHACO AND INTRAOCULAR LENS PLACEMENT (I[facility]);  Surgeon: Birder Robson, MD;  Location: [facility] ORS;  Service: Ophthalmology;  Laterality: Left;  [place] 00:32 AP% 13.7 CDE 4.40 Fluid pack lot # [id number]   COLON SURGERY  06/06/2011   Resection of terminal ileum and previous ileocolonic  anastomosis for recurrent Crohn's disease. 3 focal strictures identified.   COLONOSCOPY  2013   COLONOSCOPY WITH PROPOFOL N/A 07/28/2021   Procedure: COLONOSCOPY WITH PROPOFOL;  Surgeon: Lesly Rubenstein, MD;  Location: [facility] END[facility];  Service: Endoscopy;  Laterality: N/A;  IDDM   JOINT REPLACEMENT Left    SHOULDER   LEFT HEART CATH AND CORONARY ANGIOGRAPHY Left 04/04/2018   Procedure: LEFT HEART CATH AND CORONARY ANGIOGRAPHY;  Surgeon: Corey Skains, MD;  Location: [facility];  Service: Cardiovascular;  Laterality: Left;   SHOULDER SURGERY Right 11/20/2018   Procedure: R SHOULDER ARTHROSCOPY: biceps tenomyotomy, distal claviculectomy, acromioplasty, posterior capsule release, debridement, decrompression of subacromial space, coracoacromial release, bursectomy   SMALL INTESTINE SURGERY  2012   THYROID SURGERY  1992   TONSILLECTOMY     HPI:  Pt  is a 76 y.o. female with medical history significant of Arthritis, ankylosing spodylitis, carotid artery disease, CAD, crohn's disease, DM2, GERD, HLD, hypothyroidism who presents for weakness and word finding difficulty.  She notes that she laid down for a nap about 2pm.  When she woke up she had right sided heaviness, trouble with balance and felt she couldn't speak well.  She also has a left sided headache under the left eye which is 7/10.  She has had chronic dizziness which is worse on lying on the right side.  She further has chronic headaches for which she has been seeing her primary doctor.  She notes no falls, no focal weakness,  no tremor (though she had some of these earlier).  She has occasional nausea.  She has chronic history of rheumatological disease, but is not on remicaide or methotrexate at this time.  She has chronic crohn's disease and is treated with budesonide, cholestyramine.  Other symptoms include dry mouth.  She also notes that before lying down for her nap, she felt somewhat off.    MRI: No acute intracranial  abnormality.  2. Findings of chronic microvascular ischemia.  OF NOTE: pt and Husband state speech-language is back her "normal now".    Assessment / Plan / Recommendation Clinical Impression   Pt seen today for Evaluation at bedside; she had just finished working w/ other therapies. Pt was resting in bed; Husband present. Pt awake, verbal and A/O x4. She endorsed feeling "weak all over". Husband and pt stated her speech was back to "normal now". Discussed various issues that can impact speech and language. Pt RA; afebrile.   Pt appears to present w/ adequate Motor Speech abilities w/ No dysarthria nor dysfluency at the conversation level noted - mild deficits were noted to be present/inconsistent yesterday in the height of acute illness but resolved today. Pt still c/o Headache currently - NSG aware. NSG Staff deny any current speech deficits during general conversation. Pt appears to enjoy conversing stating she is feeling "better" today. Pt was able to engage in both casual and complex conversation as she completed the cognitive-linguistic evaluation.  Encouraged pt to take her time in setting of headache and not sleeping much last night in the ED.  During the evaluation, pt was verbal and engaged easily; no pragmatic deficits. Speech intelligibility was 100% during conversation. No receptive nor expressive language deficits noted; no aphasia. Tasks including picture description, following commands, more complex Y/N questions, calculations, naming, and completing similarity/differences tasks were all [facility]. No overt aphasia noted. No overt Cognitive deficits noted as well; A/O x4, calculation, fluency, and functional memory questions were all WFL. OM exam was [facility] for lingual/labial ROM and strength. Symmetry WFL.  No overt swallowing deficits noted during sips of thin liquids via straw during session. NSG reported none.   Discussed results of this informal cognitive-linguistic assessment and the  recommendations of no f/u recommended at Discharge -- both pt and Husband agreed stating she felt her communication skills were [facility]. Encouraged rest and nutrition/hydration now and upon returning home. NSG to reconsult if any decline in status while admitted. NSG/MD updated.    SLP Assessment  SLP Recommendation/Assessment: Patient does not need any further Speech [place] [facility] SLP Visit Diagnosis: Cognitive communication deficit (R41.841) [facility])    Recommendations for follow up therapy are one component of a multi-disciplinary discharge planning process, led by the attending physician.  Recommendations may be updated based on patient status, additional functional criteria and insurance authorization.    Follow Up Recommendations  No SLP follow up    Assistance Recommended at Discharge  PRN (just feels weak "overall")  Functional Status Assessment Patient has had a recent decline in their functional status and demonstrates the ability to make significant improvements in function in a reasonable and predictable amount of time.  Frequency and Duration  (n/a)   (n/a)      SLP Evaluation Cognition  Overall Cognitive Status: Within Functional Limits for tasks assessed Arousal/Alertness: Awake/alert Orientation Level: Oriented X4 Attention: Focused;Sustained Focused Attention: Appears intact Sustained Attention: Appears intact Memory: Appears intact Awareness: Appears intact Problem Solving: Appears intact Executive Function: Organizing;Decision Making Organizing: Appears intact Decision  Making: Appears intact Behaviors:  (n/a) Safety/Judgment: Appears intact       Comprehension  Auditory Comprehension Overall Auditory Comprehension: Appears within functional limits for tasks assessed Yes/No Questions: Within Functional Limits Commands: Within Functional Limits Conversation: Complex Interfering Components:  (n/a) Visual Recognition/Discrimination Discrimination: Not  tested Reading Comprehension Reading Status: Within funtional limits    Expression Expression Primary Mode of Expression: Verbal Verbal Expression Overall Verbal Expression: Appears within functional limits for tasks assessed Initiation: No impairment Automatic Speech:  (WFL) Level of Generative/Spontaneous Verbalization: Conversation Repetition: No impairment Naming: No impairment Pragmatics: No impairment Interfering Components:  (n/a) Non-Verbal Means of Communication: Not applicable Written Expression Dominant Hand: Left Written Expression: Not tested   Oral / Motor  Oral Motor/Sensory Function Overall Oral Motor/Sensory Function: Within functional limits Motor Speech Overall Motor Speech: Appears within functional limits for tasks assessed Respiration: Within functional limits Phonation: Normal Resonance: Within functional limits Articulation: Within functional limitis Intelligibility: Intelligible Motor Planning: Witnin functional limits Motor Speech Errors: Not applicable Effective Techniques:  (taking her time)               Orinda Kenner, MS, CCC-SLP Speech Language Pathologist Rehab Services; [place] [phone] (ascom) Damaree Sargent 10/11/2021, 12:16 PM

## 2021-10-11 NOTE — ED Notes (Signed)
Informed RN bed assigned 

## 2021-10-11 NOTE — Evaluation (Signed)
Occupational Therapy Evaluation Patient Details Name: Joanna Ortiz MRN: [id number] DOB: 15-Jul-1945 Today's Date: 10/11/2021   History of Present Illness 76 y.o. female with medical history significant of Arthritis, ankylosing spodylitis, carotid artery disease, CAD, crohn's disease, DM2, GERD, HLD, hypothyroidism who presents for weakness and word finding difficulty.   Clinical Impression   Pt was seen for OT evaluation this date. Prior to hospital admission, pt was generally independent, no AD use, and enjoys gardening. Pt lives with her spouse in 1 story home with 3 steps to enter and R/L rails (cannot reach both). Pt also reports baseline dizziness, particularly with supine and turning to R side. Pt presents to acute OT demonstrating mild impaired ADL performance and functional mobility 2/2 impaired balance (See OT problem list). Pt currently requires supv with bed mobility, CGA for ADL transfers with and without handheld assist. Ambulates in ED room to low toilet with CGA. Pt required MIN A for LB ADL tasks. Pt would benefit from skilled OT services to address noted impairments and functional limitations (see below for any additional details) in order to maximize safety and independence while minimizing falls risk and caregiver burden. Upon hospital discharge, recommend HHOT to maximize pt safety and return to functional independence during meaningful occupations of daily life.   Recommendations for follow up therapy are one component of a multi-disciplinary discharge planning process, led by the attending physician.  Recommendations may be updated based on patient status, additional functional criteria and insurance authorization.   Follow Up Recommendations  Home health OT    Assistance Recommended at Discharge Intermittent Supervision/Assistance  Patient can return home with the following A little help with walking and/or transfers;A little help with bathing/dressing/bathroom;Assistance  with cooking/housework;Assist for transportation;Help with stairs or ramp for entrance    Functional Status Assessment  Patient has had a recent decline in their functional status and demonstrates the ability to make significant improvements in function in a reasonable and predictable amount of time.  Equipment Recommendations  None recommended by OT    Recommendations for Other Services       Precautions / Restrictions Precautions Precautions: Fall Restrictions Weight Bearing Restrictions: No      Mobility Bed Mobility Overal bed mobility: Needs Assistance Bed Mobility: Sit to Supine       Sit to supine: Supervision, HOB elevated        Transfers Overall transfer level: Needs assistance Equipment used: None, 1 person hand held assist Transfers: Sit to/from Stand Sit to Stand: Supervision                  Balance Overall balance assessment: Needs assistance Sitting-balance support: No upper extremity supported, Feet supported Sitting balance-Leahy Scale: Good     Standing balance support: Single extremity supported, During functional activity Standing balance-Leahy Scale: Fair                             ADL either performed or assessed with clinical judgement   ADL                                         General ADL Comments: Pt required MIN A for LB dressing, CGA for toilet transfers, supv for pericare and clothing mgt in standing     Vision         Perception  Praxis      Pertinent Vitals/Pain Pain Assessment Pain Assessment: 0-10 Pain Score: 2  Pain Location: ache behind her eyes Pain Descriptors / Indicators: Aching Pain Intervention(s): Limited activity within patient's tolerance, Repositioned     Hand Dominance Left   Extremity/Trunk Assessment Upper Extremity Assessment Upper Extremity Assessment: Overall WFL for tasks assessed   Lower Extremity Assessment Lower Extremity Assessment: Overall  WFL for tasks assessed       Communication Communication Communication: No difficulties   Cognition Arousal/Alertness: Awake/alert Behavior During Therapy: WFL for tasks assessed/performed Overall Cognitive Status: Within Functional Limits for tasks assessed                                       General Comments       Exercises     Shoulder Instructions      Home Living Family/patient expects to be discharged to:: Private residence Living Arrangements: Spouse/significant other Available Help at Discharge: Family;Available 24 hours/day Type of Home: House Home Access: Stairs to enter [employer] of Steps: 3 Entrance Stairs-Rails: Right;Left Home Layout: One level     Bathroom Shower/Tub: Occupational psychologist: Handicapped height     Home Equipment: None          Prior Functioning/Environment Prior Level of Function : Independent/Modified Independent             Mobility Comments: Ind amb community distances without an AD, no fall history, active, gardens ADLs Comments: Ind with ADLs        OT Problem List: Impaired balance (sitting and/or standing);Decreased knowledge of use of DME or AE      OT Treatment/Interventions: Self-care/ADL training;Therapeutic exercise;Therapeutic activities;DME and/or AE instruction;Patient/family education;Balance training    OT Goals(Current goals can be found in the care plan section) Acute Rehab OT Goals Patient Stated Goal: get better adn go home OT Goal Formulation: With patient/family Time For Goal Achievement: 10/25/21 Potential to Achieve Goals: Good ADL Goals Pt Will Perform Lower Body Dressing: with modified independence;sit to/from stand Pt Will Transfer to Toilet: with modified independence;ambulating (LRAD) Additional ADL Goal #1: Pt will verbalize plan to implement at least 1 learned falls prevention strategy to support safety/indep with ADL and IADL.  OT Frequency:  Min 2X/week    Co-evaluation              AM-PAC OT "6 Clicks" Daily Activity     Outcome Measure Help from another person eating meals?: None Help from another person taking care of personal grooming?: None Help from another person toileting, which includes using toliet, bedpan, or urinal?: A Little Help from another person bathing (including washing, rinsing, drying)?: A Little Help from another person to put on and taking off regular upper body clothing?: None Help from another person to put on and taking off regular lower body clothing?: A Little 6 Click Score: 21   End of Session    Activity Tolerance: Patient tolerated treatment well Patient left: in bed;with call bell/phone within reach;Other (comment) (PT)  OT Visit Diagnosis: Unsteadiness on feet (R26.81)                Time: 1000-1024 OT Time Calculation (min): 24 min Charges:  OT General Charges $OT Visit: 1 Visit OT Evaluation $OT Eval Low Complexity: 1 Low OT Treatments $Self Care/Home Management : 8-22 mins  Ardeth Perfect., MPH, MS, OTR/L ascom [phone] 10/11/21,  12:03 PM

## 2021-10-11 NOTE — Discharge Summary (Signed)
Physician Discharge Summary   Patient: Joanna Ortiz MRN: [id number] DOB: 24-Jul-1945  Admit date:     10/10/2021  Discharge date: 10/11/21  Discharge Physician: Sharen Hones   PCP: Sharyne Peach, MD   Recommendations at discharge:    Follow up with PCP in 1 week Follow up with neurology in 1 month  Discharge Diagnoses: Principal Problem:   TIA (transient ischemic attack) Active Problems:   Temporal headache   Crohn's disease ([place])   Hypokalemia   CVA (cerebral vascular accident) ([place])  Resolved Problems:   * No resolved hospital problems. *  Hospital Course: Joanna Ortiz is a 76 y.o. female with medical history significant of Arthritis, ankylosing spodylitis, carotid artery disease, CAD, crohn's disease, DM2, GERD, HLD, hypothyroidism who presents for weakness and word finding difficulty.  Her symptom improved after several hours, but she has a persistent headache. Patient MRI of the brain did not show any acute stroke, neurology has seen patient and signed off.  CT angiogram neck and head did not show any significant occlusion.  Echocardiogram showed EF 50-55%.  At this point, patient has passed speech eval, able to ambulate, she is medically stable to be discharged.  We will continue aspirin and Lipitor.  She will be followed with neurology as outpatient.    Assessment and Plan: Transient ischemic attack. MRI did not show a stroke, carotid ultrasound did not show significant occlusion per echocardiogram showed normal ejection fraction.  Telemetry did not show any atrial fibrillation.  LDL 86.  Patient be treated with aspirin and statin.  Arrange follow-up with neurology in 1 month.  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 Symptomatic treatment with Tylenol as needed.  Type 2 diabetes. Glucose appears to be controlled.  Hypokalemia. Give 40 mEq oral potassium for potassium 3.4, patient was also taking potassium chronically.        Consultants:  Neurology Procedures performed: None  Disposition: Home Diet recommendation:  Discharge Diet Orders (From admission, onward)     Start     Ordered   10/11/21 0000  Diet - low sodium heart healthy        10/11/21 1600           Cardiac diet DISCHARGE MEDICATION: Allergies as of 10/11/2021       Reactions   Erythromycin Hives   Hydromorphone Hives   Cefuroxime Nausea And Vomiting   Ciprocinonide [fluocinolone]    Muscle pain   Codeine    Other reaction(s): Vomiting   Floxin [ofloxacin] Nausea And Vomiting   Keflex [cephalexin] Nausea And Vomiting   Levofloxacin Nausea And Vomiting   Sulfa Antibiotics Nausea And Vomiting   Ciprofloxacin Nausea And Vomiting        Medication List     STOP taking these medications    Adalimumab 40 MG/0.4ML Pskt   albuterol 108 (90 Base) MCG/ACT inhaler Commonly known as: VENTOLIN HFA   butalbital-acetaminophen-caffeine 50-325-40 MG tablet Commonly known as: FIORICET   folic acid 1 MG tablet Commonly known as: FOLVITE   insulin glargine 100 UNIT/ML Solostar Pen Commonly known as: LANTUS   methotrexate 2.5 MG tablet Commonly known as: RHEUMATREX   Xifaxan 550 MG Tabs tablet Generic drug: rifaximin       TAKE these medications    acetaminophen 500 MG tablet Commonly known as: TYLENOL Take 1,000 mg by mouth every 8 (eight) hours as needed for mild pain or moderate pain.   aspirin EC 81 MG tablet Take 1 tablet (81 mg total) by  mouth daily. Swallow whole.   atorvastatin 40 MG tablet Commonly known as: LIPITOR Take 1 tablet (40 mg total) by mouth daily. Start taking on: October 12, 2021   budesonide 3 MG 24 hr capsule Commonly known as: ENTOCORT EC Take 9 mg by mouth daily.   cholestyramine light 4 g packet Commonly known as: PREVALITE Take 1 packet by mouth daily.   CINNAMON PO Take 1,200 mg by mouth daily.   cyanocobalamin 1000 MCG/ML injection Commonly known as: VITAMIN B12 Inject 1,000 mcg into the  muscle every 30 (thirty) days.   Insulin Lispro w/ Trans Port 100 UNIT/ML Sopn SSI  2 units 150-200 4 units-201-250 6 units 251-300 8 units 301-350 10 units-351-400 >400 call MD   loratadine 10 MG tablet Commonly known as: CLARITIN Take 10 mg by mouth daily.   magnesium oxide 400 MG tablet Commonly known as: MAG-OX Take 400 mg by mouth daily.   ondansetron 4 MG disintegrating tablet Commonly known as: ZOFRAN-ODT Take by mouth.   Potassium Chloride 40 MEQ/15ML (20%) Soln Take 15 ml by mouth once a week.   Rinvoq 15 MG Tb24 Generic drug: Upadacitinib ER Take 15 mg by mouth daily.   Synthroid 100 MCG tablet Generic drug: levothyroxine Take 100 mcg by mouth daily before breakfast. Take 6 days per week.   Theratears 0.25 % Soln Generic drug: Carboxymethylcellulose Sodium Place 1 drop into both eyes 3 (three) times daily as needed (dry/irritated eyes.).   thyroid 30 MG tablet Commonly known as: ARMOUR Take 30 mg by mouth daily before breakfast.   Turmeric 500 MG Caps Take 500 mg by mouth daily.   vitamin C 1000 MG tablet Take 1,000 mg by mouth 3 (three) times a week.        Follow-up Information     Sharyne Peach, MD Follow up in 1 week(s).   Specialty: Family Medicine Contact information: [place] [address] [phone]         Corey Skains, MD Follow up in 2 week(s).   Specialty: Cardiology Contact information: [facility] [place] [address] [place] [facility] Follow up in 1 month(s).   Contact information: [place] [phone]               Discharge Exam: Danley Danker Weights   10/11/21 0012  Weight: 78.9 kg   General exam: Appears calm and comfortable  Respiratory system: Clear to auscultation. Respiratory effort normal. Cardiovascular system: S1 & S2 heard, RRR. No JVD,  murmurs, rubs, gallops or clicks. No pedal edema. Gastrointestinal system: Abdomen is nondistended, soft and nontender. No organomegaly or masses felt. Normal bowel sounds heard. Central nervous system: Alert and oriented. No focal neurological deficits. Extremities: Symmetric 5 x 5 power. Skin: No rashes, lesions or ulcers Psychiatry: Judgement and insight appear normal. Mood & affect appropriate.    Condition at discharge: good  The results of significant diagnostics from this hospitalization (including imaging, microbiology, ancillary and laboratory) are listed below for reference.   Imaging Studies: ECHOCARDIOGRAM COMPLETE  Result Date: 10/11/2021    ECHOCARDIOGRAM REPORT   Patient Name:   Joanna Ortiz Date of Exam: 10/11/2021 Medical Rec #:  [id number]        Height:       68.0 in Accession #:    [id number]       Weight:  173.9 lb Date of Birth:  05-Oct-1945       BSA:          1.926 m Patient Age:    41 years         BP:           153/77 mmHg Patient Gender: F                HR:           81 bpm. Exam Location:  [facility] Procedure: 2D Echo, Color Doppler and Cardiac Doppler Indications:     G45.9 TIA  History:         Patient has no prior history of Echocardiogram examinations.                  CAD, Arrythmias:LBBB; Risk Factors:Dyslipidemia and Diabetes.  Sonographer:     Charmayne Sheer Referring Phys:  [place] Diagnosing Phys: Yolonda Kida MD  Sonographer Comments: No subcostal window. IMPRESSIONS  1. Left ventricular ejection fraction, by estimation, is 50 to 55%. The left ventricle has low normal function. The left ventricle demonstrates global hypokinesis. Left ventricular diastolic parameters are consistent with Grade I diastolic dysfunction (impaired relaxation).  2. Right ventricular systolic function is normal. The right ventricular size is normal.  3. The mitral valve is normal in structure. No evidence of mitral valve regurgitation.  4. The aortic valve is grossly  normal. Aortic valve regurgitation is not visualized. FINDINGS  Left Ventricle: Left ventricular ejection fraction, by estimation, is 50 to 55%. The left ventricle has low normal function. The left ventricle demonstrates global hypokinesis. The left ventricular internal cavity size was normal in size. There is no left ventricular hypertrophy. Left ventricular diastolic parameters are consistent with Grade I diastolic dysfunction (impaired relaxation). Right Ventricle: The right ventricular size is normal. No increase in right ventricular wall thickness. Right ventricular systolic function is normal. Left Atrium: Left atrial size was normal in size. Right Atrium: Right atrial size was normal in size. Pericardium: There is no evidence of pericardial effusion. Mitral Valve: The mitral valve is normal in structure. There is mild thickening of the mitral valve leaflet(s). Mild mitral annular calcification. No evidence of mitral valve regurgitation. Tricuspid Valve: The tricuspid valve is normal in structure. Tricuspid valve regurgitation is trivial. Aortic Valve: The aortic valve is grossly normal. Aortic valve regurgitation is not visualized. Aortic valve mean gradient measures 5.0 mmHg. Aortic valve peak gradient measures 8.3 mmHg. Aortic valve area, by VTI measures 1.91 cm. Pulmonic Valve: The pulmonic valve was normal in structure. Pulmonic valve regurgitation is not visualized. Aorta: The ascending aorta was not well visualized. IAS/Shunts: No atrial level shunt detected by color flow Doppler. Additional Comments: There is no pleural effusion.  LEFT VENTRICLE PLAX 2D LVIDd:         4.80 cm     Diastology LVIDs:         3.65 cm     LV e' medial:    4.24 cm/s LV PW:         1.20 cm     LV E/e' medial:  23.4 LV IVS:        0.73 cm     LV e' lateral:   4.90 cm/s LVOT diam:     2.00 cm     LV E/e' lateral: 20.3 LV SV:         56 LV SV Index:   29 LVOT Area:  3.14 cm  LV Volumes (MOD) LV vol d, MOD A2C: 81.5 ml LV  vol d, MOD A4C: 96.5 ml LV vol s, MOD A2C: 44.5 ml LV vol s, MOD A4C: 49.4 ml LV SV MOD A2C:     37.0 ml LV SV MOD A4C:     96.5 ml LV SV MOD BP:      43.1 ml RIGHT VENTRICLE RV Basal diam:  2.81 cm LEFT ATRIUM             Index        RIGHT ATRIUM           Index LA diam:        3.20 cm 1.66 cm/m   RA Area:     11.00 cm LA Vol (A2C):   29.6 ml 15.37 ml/m  RA Volume:   19.30 ml  10.02 ml/m LA Vol (A4C):   36.3 ml 18.85 ml/m LA Biplane Vol: 33.6 ml 17.45 ml/m  AORTIC VALVE AV Area (Vmax):    1.79 cm AV Area (Vmean):   1.75 cm AV Area (VTI):     1.91 cm AV Vmax:           144.00 cm/s AV Vmean:          106.000 cm/s AV VTI:            0.291 m AV Peak Grad:      8.3 mmHg AV Mean Grad:      5.0 mmHg LVOT Vmax:         82.20 cm/s LVOT Vmean:        59.200 cm/s LVOT VTI:          0.177 m LVOT/AV VTI ratio: 0.61  AORTA Ao Root diam: 3.00 cm MITRAL VALVE MV Area (PHT): 4.54 cm     SHUNTS MV Decel Time: 167 msec     Systemic VTI:  0.18 m MV E velocity: 99.40 cm/s   Systemic Diam: 2.00 cm MV A velocity: 130.00 cm/s MV E/A ratio:  0.76 Dwayne D Callwood MD Electronically signed by Yolonda Kida MD Signature Date/Time: 10/11/2021/3:58:07 PM    Final    MR BRAIN WO CONTRAST  Result Date: 10/10/2021 CLINICAL DATA:  Altered mental status EXAM: MRI HEAD WITHOUT CONTRAST TECHNIQUE: Multiplanar, multiecho pulse sequences of the brain and surrounding structures were obtained without intravenous contrast. COMPARISON:  08/19/2021 brain MRI FINDINGS: Brain: No acute infarct, mass effect or extra-axial collection. No acute or chronic hemorrhage. There is multifocal hyperintense T2-weighted signal within the white matter. Parenchymal volume and CSF spaces are normal. The midline structures are normal. Vascular: Major flow voids are preserved. Skull and upper cervical spine: Normal calvarium and skull base. Visualized upper cervical spine and soft tissues are normal. Sinuses/Orbits:No paranasal sinus fluid levels or advanced  mucosal thickening. No mastoid or middle ear effusion. Normal orbits. IMPRESSION: 1. No acute intracranial abnormality. 2. Findings of chronic microvascular ischemia. Electronically Signed   By: Ulyses Jarred M.D.   On: 10/10/2021 23:41   CT ANGIO HEAD NECK W WO CM (CODE STROKE)  Result Date: 10/10/2021 CLINICAL DATA:  Neuro deficit, acute, stroke suspected. Altered mental status, left-sided headache, slurred speech, and dizziness. EXAM: CT ANGIOGRAPHY HEAD AND NECK TECHNIQUE: Multidetector CT imaging of the head and neck was performed using the standard protocol during bolus administration of intravenous contrast. Multiplanar CT image reconstructions and MIPs were obtained to evaluate the vascular anatomy. Carotid stenosis measurements (when applicable) are obtained utilizing NASCET criteria,  using the distal internal carotid diameter as the denominator. RADIATION DOSE REDUCTION: This exam was performed according to the departmental dose-optimization program which includes automated exposure control, adjustment of the mA and/or kV according to patient size and/or use of iterative reconstruction technique. CONTRAST:  73m OMNIPAQUE IOHEXOL 350 MG/ML SOLN COMPARISON:  Head and neck CTA 02/01/2021 FINDINGS: CTA NECK FINDINGS Aortic arch: Standard 3 vessel aortic arch with mild atherosclerotic plaque. Widely patent arch vessel origins. Right carotid system: Patent with a small amount of calcified plaque at the carotid bifurcation. No evidence of a significant stenosis or dissection. Retropharyngeal course of the proximal ICA. Left carotid system: Patent with a small amount of calcified plaque at the carotid bifurcation. No evidence of a significant stenosis or dissection. Vertebral arteries: Patent without evidence of a significant stenosis or dissection. Moderately dominant right vertebral artery. Skeleton: Moderate cervical disc degeneration. Advanced upper cervical facet arthrosis. Other neck: No evidence of  cervical lymphadenopathy or mass. Upper chest: No apical lung consolidation or mass. Review of the MIP images confirms the above findings CTA HEAD FINDINGS Anterior circulation: The internal carotid arteries are patent from skull base to carotid termini with mild atherosclerotic plaque bilaterally not resulting in significant stenosis. ACAs and MCAs are patent without evidence of a proximal branch occlusion or significant proximal stenosis. The right A1 segment is hypoplastic. No aneurysm is identified. Posterior circulation: The intracranial vertebral arteries are widely patent to the basilar. Patent bilateral PICA, right AICA, and bilateral SCA origins are visualized. The basilar artery is widely patent. There is a patent right posterior communicating artery. Both PCAs are patent without evidence of a significant proximal stenosis. No aneurysm is identified. Venous sinuses: Patent. Anatomic variants: None of significance. Review of the MIP images confirms the above findings IMPRESSION: 1. Mild atherosclerosis in the head and neck without large vessel occlusion or significant stenosis. 2. Aortic Atherosclerosis (ICD10-I70.0). Electronically Signed   By: ALogan BoresM.D.   On: 10/10/2021 20:05   CT HEAD CODE STROKE WO CONTRAST  Result Date: 10/10/2021 CLINICAL DATA:  Code stroke.  Acute neurologic deficit EXAM: CT HEAD WITHOUT CONTRAST TECHNIQUE: Contiguous axial images were obtained from the base of the skull through the vertex without intravenous contrast. RADIATION DOSE REDUCTION: This exam was performed according to the departmental dose-optimization program which includes automated exposure control, adjustment of the mA and/or kV according to patient size and/or use of iterative reconstruction technique. COMPARISON:  08/18/2021 FINDINGS: Brain: There is no mass, hemorrhage or extra-axial collection. The size and configuration of the ventricles and extra-axial CSF spaces are normal. The brain parenchyma is  normal, without evidence of acute or chronic infarction. Vascular: No abnormal hyperdensity of the major intracranial arteries or dural venous sinuses. No intracranial atherosclerosis. Skull: The visualized skull base, calvarium and extracranial soft tissues are normal. Sinuses/Orbits: No fluid levels or advanced mucosal thickening of the visualized paranasal sinuses. No mastoid or middle ear effusion. The orbits are normal. ASPECTS ([facility]roke Program Early CT Score) - Ganglionic level infarction (caudate, lentiform nuclei, internal capsule, insula, M1-M3 cortex): 7 - Supraganglionic infarction (M4-M6 cortex): 3 Total score (0-10 with 10 being normal): 10 IMPRESSION: 1. No acute intracranial abnormality. 2. ASPECTS is 10. These results were called by telephone at the time of interpretation on 10/10/2021 at 7:24 pm to provider CDuffy Bruce, who verbally acknowledged these results. Electronically Signed   By: KUlyses JarredM.D.   On: 10/10/2021 19:25    Microbiology: Results for orders placed or  performed during the hospital encounter of 01/28/21  C Difficile Quick Screen w PCR reflex     Status: None   Collection Time: 01/28/21 11:27 AM   Specimen: STOOL  Result Value Ref Range Status   C Diff antigen NEGATIVE NEGATIVE Final   C Diff toxin NEGATIVE NEGATIVE Final   C Diff interpretation No C. difficile detected.  Final    Comment: Performed at [facility], [address], [place],  [address]    Labs: CBC: Recent Labs  Lab 10/10/21 1922 10/11/21 0608  WBC 8.1 10.0  NEUTROABS 3.2  --   HGB 11.8* 11.6*  HCT 36.4 36.4  MCV 89.4 88.6  PLT 180 702   Basic Metabolic Panel: Recent Labs  Lab 10/10/21 1922 10/11/21 0608  NA 138 141  K 3.7 3.4*  CL 103 107  CO2 27 28  GLUCOSE 138* 145*  BUN 21 18  CREATININE 1.01* 0.94  CALCIUM 8.1* 7.8*   Liver Function Tests: Recent Labs  Lab 10/10/21 1922 10/11/21 0608  AST 32 30  ALT 47* 43  ALKPHOS 105 103   BILITOT 0.6 0.4  PROT 6.4* 6.4*  ALBUMIN 3.4* 3.3*   CBG: Recent Labs  Lab 10/10/21 1906 10/11/21 0017 10/11/21 0742 10/11/21 1207 10/11/21 1537  GLUCAP 146* 135* 136* 122* 108*    Discharge time spent: greater than 30 minutes.  Signed: Sharen Hones, MD [facility] 10/11/2021

## 2021-10-11 NOTE — TOC Initial Note (Signed)
Transition of Care [facility]) - Initial/Assessment Note    Patient Details  Name: MIRENDA BALTAZAR MRN: [id number] Date of Birth: Mar 07, 1945  [facility]) CM/SW Contact:    Pete Pelt, RN Phone Number: 10/11/2021, 4:17 PM  Clinical Narrative:    patient lives at home with spouse, and they have 6 children for support.  They have outpatient PT and a walker at home.                     Patient Goals and CMS Choice        Expected Discharge Plan and Services           Expected Discharge Date: 10/11/21                                    Prior Living Arrangements/Services                       Activities of Daily Living Home Assistive Devices/Equipment: None ADL Screening (condition at time of admission) Patient's cognitive ability adequate to safely complete daily activities?: Yes Is the patient deaf or have difficulty hearing?: No Does the patient have difficulty seeing, even when wearing glasses/contacts?: No Does the patient have difficulty concentrating, remembering, or making decisions?: No Patient able to express need for assistance with ADLs?: Yes Does the patient have difficulty dressing or bathing?: Yes Independently performs ADLs?: No Does the patient have difficulty walking or climbing stairs?: Yes Weakness of Legs: Both Weakness of Arms/Hands: Both  Permission Sought/Granted                  Emotional Assessment              Admission diagnosis:  TIA (transient ischemic attack) [G45.9] Stroke-like symptom [R29.90] CVA (cerebral vascular accident) [facility]) [I63.9] Patient Active Problem List   Diagnosis Date Noted   Hypokalemia 10/11/2021   CVA (cerebral vascular accident) ([place]) 10/11/2021   TIA (transient ischemic attack) 10/10/2021   Temporal headache 08/18/2021   Dysarthria 08/18/2021   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

## 2021-10-11 NOTE — Evaluation (Addendum)
Physical Therapy Evaluation Patient Details Name: Joanna Ortiz MRN: [id number] DOB: [birth year] Today's Date: 10/11/2021  History of Present Illness  76 y.o. female with medical history significant of Arthritis, ankylosing spodylitis, carotid artery disease, CAD, crohn's disease, DM2, GERD, HLD, hypothyroidism who presents for weakness and word finding difficulty.  Clinical Impression  Pt was pleasant and motivated to participate during the session and put forth good effort throughout. Pt able to amb in hallway w CGA and no LOB, required minimal cueing for redirection. Strength grossly symmetrical B. Sensation intact. Pt reported still feeling a bit unsteady compared to baseline, noted for decreased gait velocity.  Pt would benefit from skilled HHPT to address above deficits in functional mobility, balance, and activity tolerance to promote optimal return to PLOF.      Recommendations for follow up therapy are one component of a multi-disciplinary discharge planning process, led by the attending physician.  Recommendations may be updated based on patient status, additional functional criteria and insurance authorization.  Follow Up Recommendations Home health PT      Assistance Recommended at Discharge Intermittent Supervision/Assistance  Patient can return home with the following  A little help with walking and/or transfers;A little help with bathing/dressing/bathroom;Assistance with cooking/housework;Assist for transportation;Help with stairs or ramp for entrance    Equipment Recommendations Rolling walker (2 wheels)  Recommendations for Other Services       Functional Status Assessment Patient has had a recent decline in their functional status and demonstrates the ability to make significant improvements in function in a reasonable and predictable amount of time.     Precautions / Restrictions Precautions Precautions: Fall Restrictions Weight Bearing Restrictions: No       Mobility  Bed Mobility Overal bed mobility: Needs Assistance Bed Mobility: Sit to Supine       Sit to supine: Supervision     Patient Response: Cooperative  Transfers                   General transfer comment: Pt amb w OT upon arrival, transfers not witnessed    Ambulation/Gait Ambulation/Gait assistance: Min guard Gait Distance (Feet): 50 Feet Assistive device: Rolling walker (2 wheels) Gait Pattern/deviations: Step-through pattern, Decreased stride length Gait velocity: decr        Stairs            Wheelchair Mobility    Modified Rankin (Stroke Patients Only)       Balance Overall balance assessment: Needs assistance Sitting-balance support: Single extremity supported, Feet unsupported Sitting balance-Leahy Scale: Good     Standing balance support: Bilateral upper extremity supported, During functional activity, Reliant on assistive device for balance Standing balance-Leahy Scale: Fair                               Pertinent Vitals/Pain Pain Assessment Pain Assessment: 0-10 Pain Location: reports HA pain behind her eyes, does not assign NPS rating Pain Descriptors / Indicators: Aching Pain Intervention(s): Limited activity within patient's tolerance, Monitored during session    Home Living Family/patient expects to be discharged to:: Private residence Living Arrangements: Spouse/significant other Available Help at Discharge: Family;Available 24 hours/day Type of Home: House Home Access: Stairs to enter Entrance Stairs-Rails: Psychiatric nurse of Steps: 3   Home Layout: One level Home Equipment: None      Prior Function Prior Level of Function : Independent/Modified Independent  Mobility Comments: Ind amb community distances without an AD, no fall history, active, gardens ADLs Comments: Ind with ADLs     Hand Dominance   Dominant Hand: Left    Extremity/Trunk Assessment    Upper Extremity Assessment Upper Extremity Assessment: Overall WFL for tasks assessed    Lower Extremity Assessment Lower Extremity Assessment: Overall WFL for tasks assessed       Communication   Communication: No difficulties  Cognition Arousal/Alertness: Awake/alert Behavior During Therapy: WFL for tasks assessed/performed Overall Cognitive Status: Within Functional Limits for tasks assessed                                          General Comments      Exercises     Assessment/Plan    PT Assessment Patient needs continued PT services  PT Problem List Decreased strength;Decreased activity tolerance;Decreased balance;Decreased mobility       PT Treatment Interventions DME instruction;Therapeutic exercise;Gait training;Balance training;Stair training;Neuromuscular re-education;Functional mobility training;Therapeutic activities;Patient/family education    PT Goals (Current goals can be found in the Care Plan section)  Acute Rehab PT Goals Patient Stated Goal: to go home PT Goal Formulation: With patient Time For Goal Achievement: 10/24/21 Potential to Achieve Goals: Good    Frequency Min 2X/week     Co-evaluation               AM-PAC PT "6 Clicks" Mobility  Outcome Measure Help needed turning from your back to your side while in a flat bed without using bedrails?: A Little Help needed moving from lying on your back to sitting on the side of a flat bed without using bedrails?: A Little Help needed moving to and from a bed to a chair (including a wheelchair)?: A Little Help needed standing up from a chair using your arms (e.g., wheelchair or bedside chair)?: A Little Help needed to walk in hospital room?: A Little Help needed climbing 3-5 steps with a railing? : A Lot 6 Click Score: 17    End of Session Equipment Utilized During Treatment: Gait belt Activity Tolerance: Patient tolerated treatment well Patient left: in bed;with  family/visitor present   PT Visit Diagnosis: Unsteadiness on feet (R26.81);Other abnormalities of gait and mobility (R26.89)    Time: 5400-8676 PT Time Calculation (min) (ACUTE ONLY): 11 min   Charges:   PT Evaluation $PT Eval Low Complexity: 1 Low         Glenice Laine MPH, SPT 10/11/21, 1:24 PM

## 2021-10-11 NOTE — Progress Notes (Signed)
Pt discharged home with family via wheelchair. PIV and tele removed. Discharge packet reviewed with patient, verbalizes understanding.

## 2021-10-11 NOTE — Progress Notes (Signed)
*  PRELIMINARY RESULTS* Echocardiogram 2D Echocardiogram has been performed.  Joanna Ortiz 10/11/2021, 11:56 AM

## 2021-10-12 ENCOUNTER — Emergency Department: Payer: Federal, State, Local not specified - PPO

## 2021-10-12 ENCOUNTER — Observation Stay
Admission: EM | Admit: 2021-10-12 | Discharge: 2021-10-13 | Disposition: A | Discharge status: Home / Self Care | Payer: Federal, State, Local not specified - PPO | Attending: Internal Medicine | Admitting: Internal Medicine

## 2021-10-12 DIAGNOSIS — I2510 Atherosclerotic heart disease of native coronary artery without angina pectoris: Secondary | ICD-10-CM | POA: Diagnosis not present

## 2021-10-12 DIAGNOSIS — Z794 Long term (current) use of insulin: Secondary | ICD-10-CM | POA: Diagnosis not present

## 2021-10-12 DIAGNOSIS — Z7982 Long term (current) use of aspirin: Secondary | ICD-10-CM | POA: Insufficient documentation

## 2021-10-12 DIAGNOSIS — R519 Headache, unspecified: Principal | ICD-10-CM | POA: Insufficient documentation

## 2021-10-12 DIAGNOSIS — G459 Transient cerebral ischemic attack, unspecified: Secondary | ICD-10-CM | POA: Insufficient documentation

## 2021-10-12 DIAGNOSIS — E039 Hypothyroidism, unspecified: Secondary | ICD-10-CM | POA: Diagnosis not present

## 2021-10-12 DIAGNOSIS — Z79899 Other long term (current) drug therapy: Secondary | ICD-10-CM | POA: Insufficient documentation

## 2021-10-12 DIAGNOSIS — E119 Type 2 diabetes mellitus without complications: Secondary | ICD-10-CM | POA: Diagnosis not present

## 2021-10-12 DIAGNOSIS — Z87891 Personal history of nicotine dependence: Secondary | ICD-10-CM | POA: Diagnosis not present

## 2021-10-12 DIAGNOSIS — Z96612 Presence of left artificial shoulder joint: Secondary | ICD-10-CM | POA: Insufficient documentation

## 2021-10-12 LAB — CBC
HCT: 39.9 % (ref 36.0–46.0)
Hemoglobin: 12.8 g/dL (ref 12.0–15.0)
MCH: 28.3 pg (ref 26.0–34.0)
MCHC: 32.1 g/dL (ref 30.0–36.0)
MCV: 88.3 fL (ref 80.0–100.0)
Platelets: 178 10*3/uL (ref 150–400)
RBC: 4.52 MIL/uL (ref 3.87–5.11)
RDW: 14.3 % (ref 11.5–15.5)
WBC: 9 10*3/uL (ref 4.0–10.5)
nRBC: 0 % (ref 0.0–0.2)

## 2021-10-12 LAB — MENINGITIS/ENCEPHALITIS PANEL (CSF)

## 2021-10-12 LAB — URINALYSIS, ROUTINE W REFLEX MICROSCOPIC
Bilirubin Urine: NEGATIVE
Glucose, UA: NEGATIVE mg/dL
Hgb urine dipstick: NEGATIVE
Ketones, ur: NEGATIVE mg/dL
Leukocytes,Ua: NEGATIVE
Nitrite: NEGATIVE
Protein, ur: NEGATIVE mg/dL
Specific Gravity, Urine: 1.012 (ref 1.005–1.030)
pH: 5 (ref 5.0–8.0)

## 2021-10-12 LAB — CBC WITH DIFFERENTIAL/PLATELET
Abs Immature Granulocytes: 0.04 10*3/uL (ref 0.00–0.07)
Basophils Absolute: 0 10*3/uL (ref 0.0–0.1)
Basophils Relative: 0 %
Eosinophils Absolute: 0.1 10*3/uL (ref 0.0–0.5)
Eosinophils Relative: 1 %
HCT: 36 % (ref 36.0–46.0)
Hemoglobin: 11.8 g/dL — ABNORMAL LOW (ref 12.0–15.0)
Immature Granulocytes: 0 %
Lymphocytes Relative: 23 %
Lymphs Abs: 2.2 10*3/uL (ref 0.7–4.0)
MCH: 28.6 pg (ref 26.0–34.0)
MCHC: 32.8 g/dL (ref 30.0–36.0)
MCV: 87.2 fL (ref 80.0–100.0)
Monocytes Absolute: 0.9 10*3/uL (ref 0.1–1.0)
Monocytes Relative: 9 %
Neutro Abs: 6.5 10*3/uL (ref 1.7–7.7)
Neutrophils Relative %: 67 %
Platelets: 171 10*3/uL (ref 150–400)
RBC: 4.13 MIL/uL (ref 3.87–5.11)
RDW: 13.9 % (ref 11.5–15.5)
WBC: 9.8 10*3/uL (ref 4.0–10.5)
nRBC: 0 % (ref 0.0–0.2)

## 2021-10-12 LAB — COMPREHENSIVE METABOLIC PANEL
ALT: 39 U/L (ref 0–44)
AST: 29 U/L (ref 15–41)
Albumin: 3.2 g/dL — ABNORMAL LOW (ref 3.5–5.0)
Alkaline Phosphatase: 93 U/L (ref 38–126)
Anion gap: 8 (ref 5–15)
BUN: 15 mg/dL (ref 8–23)
CO2: 24 mmol/L (ref 22–32)
Calcium: 8.2 mg/dL — ABNORMAL LOW (ref 8.9–10.3)
Chloride: 105 mmol/L (ref 98–111)
Creatinine, Ser: 0.71 mg/dL (ref 0.44–1.00)
GFR, Estimated: >60 mL/min (ref >60)
Glucose, Bld: 151 mg/dL — ABNORMAL HIGH (ref 70–99)
Potassium: 3.2 mmol/L — ABNORMAL LOW (ref 3.5–5.1)
Sodium: 137 mmol/L (ref 135–145)
Total Bilirubin: 1 mg/dL (ref 0.3–1.2)
Total Protein: 6.2 g/dL — ABNORMAL LOW (ref 6.5–8.1)

## 2021-10-12 LAB — BASIC METABOLIC PANEL
Anion gap: 11 (ref 5–15)
BUN: 15 mg/dL (ref 8–23)
CO2: 24 mmol/L (ref 22–32)
Calcium: 8.4 mg/dL — ABNORMAL LOW (ref 8.9–10.3)
Chloride: 104 mmol/L (ref 98–111)
Creatinine, Ser: 0.82 mg/dL (ref 0.44–1.00)
GFR, Estimated: >60 mL/min (ref >60)
Glucose, Bld: 183 mg/dL — ABNORMAL HIGH (ref 70–99)
Potassium: 3.6 mmol/L (ref 3.5–5.1)
Sodium: 139 mmol/L (ref 135–145)

## 2021-10-12 LAB — CSF CELL COUNT WITH DIFFERENTIAL
Eosinophils, CSF: 0 %
Eosinophils, CSF: 0 %
Lymphs, CSF: 64 %
Lymphs, CSF: 85 %
Monocyte-Macrophage-Spinal Fluid: 0 %
Monocyte-Macrophage-Spinal Fluid: 18 %
RBC Count, CSF: 0 /mm3 (ref 0–3)
RBC Count, CSF: 0 /mm3 (ref 0–3)
Segmented Neutrophils-CSF: 15 %
Segmented Neutrophils-CSF: 18 %
Tube #: 1
Tube #: 3
WBC, CSF: 2 /mm3 (ref 0–5)
WBC, CSF: 5 /mm3 (ref 0–5)

## 2021-10-12 LAB — PROTEIN AND GLUCOSE, CSF
Glucose, CSF: 93 mg/dL — ABNORMAL HIGH (ref 40–70)
Total  Protein, CSF: 34 mg/dL (ref 15–45)

## 2021-10-12 LAB — CRYPTOCOCCAL ANTIGEN, CSF: Crypto Ag: NEGATIVE

## 2021-10-12 LAB — PHOSPHORUS: Phosphorus: 4.5 mg/dL (ref 2.5–4.6)

## 2021-10-12 LAB — MAGNESIUM: Magnesium: 2.6 mg/dL — ABNORMAL HIGH (ref 1.7–2.4)

## 2021-10-12 MED ORDER — ALBUTEROL SULFATE (2.5 MG/3ML) 0.083% IN NEBU: 2.5000 mg | INHALATION_SOLUTION | RESPIRATORY_TRACT | Status: DC | PRN

## 2021-10-12 MED ORDER — MAGNESIUM SULFATE 2 GM/50ML IV SOLN
2.0000 g | Freq: Once | INTRAVENOUS | Status: AC
  Administered 2021-10-12: 2 g via INTRAVENOUS
  Filled 2021-10-12: qty 50

## 2021-10-12 MED ORDER — ACETAMINOPHEN 650 MG RE SUPP: 650.0000 mg | Freq: Four times a day (QID) | RECTAL | Status: DC | PRN

## 2021-10-12 MED ORDER — BUTALBITAL-APAP-CAFFEINE 50-325-40 MG PO TABS
1.0000 | ORAL_TABLET | Freq: Once | ORAL | Status: AC
  Administered 2021-10-12: 1 via ORAL
  Filled 2021-10-12: qty 1

## 2021-10-12 MED ORDER — METOCLOPRAMIDE HCL 5 MG/ML IJ SOLN
10.0000 mg | Freq: Three times a day (TID) | INTRAMUSCULAR | Status: DC | PRN
Start: 2021-10-12 — End: 2021-10-13

## 2021-10-12 MED ORDER — LIDOCAINE HCL (PF) 1 % IJ SOLN
10.0000 mL | Freq: Once | INTRAMUSCULAR | Status: AC
  Administered 2021-10-12: 6 mL via SUBCUTANEOUS

## 2021-10-12 MED ORDER — INSULIN ASPART 100 UNIT/ML IJ SOLN
0.0000 [IU] | Freq: Three times a day (TID) | INTRAMUSCULAR | Status: DC
  Administered 2021-10-13 (×2): 1 [IU] via SUBCUTANEOUS
  Filled 2021-10-12 (×2): qty 1

## 2021-10-12 MED ORDER — KETOROLAC TROMETHAMINE 30 MG/ML IJ SOLN
30.0000 mg | Freq: Once | INTRAMUSCULAR | Status: AC
  Administered 2021-10-12: 30 mg via INTRAVENOUS
  Filled 2021-10-12: qty 1

## 2021-10-12 MED ORDER — HEPARIN SODIUM (PORCINE) 5000 UNIT/ML IJ SOLN
5000.0000 [IU] | Freq: Three times a day (TID) | INTRAMUSCULAR | Status: DC
  Administered 2021-10-12 – 2021-10-13 (×2): 5000 [IU] via SUBCUTANEOUS
  Filled 2021-10-12 (×2): qty 1

## 2021-10-12 MED ORDER — METOCLOPRAMIDE HCL 5 MG/ML IJ SOLN
10.0000 mg | Freq: Once | INTRAMUSCULAR | Status: AC
  Administered 2021-10-12: 10 mg via INTRAVENOUS
  Filled 2021-10-12: qty 2

## 2021-10-12 MED ORDER — KETOROLAC TROMETHAMINE 30 MG/ML IJ SOLN: 30.0000 mg | Freq: Three times a day (TID) | INTRAMUSCULAR | Status: DC | PRN

## 2021-10-12 MED ORDER — ACETAMINOPHEN 325 MG PO TABS: 650.0000 mg | ORAL_TABLET | Freq: Four times a day (QID) | ORAL | Status: DC | PRN

## 2021-10-12 MED ORDER — ONDANSETRON HCL 4 MG PO TABS: 4.0000 mg | ORAL_TABLET | Freq: Four times a day (QID) | ORAL | Status: DC | PRN

## 2021-10-12 MED ORDER — SODIUM CHLORIDE 0.9 % IV BOLUS
500.0000 mL | Freq: Once | INTRAVENOUS | Status: AC
  Administered 2021-10-12: 500 mL via INTRAVENOUS

## 2021-10-12 MED ORDER — DIPHENHYDRAMINE HCL 50 MG/ML IJ SOLN
25.0000 mg | Freq: Once | INTRAMUSCULAR | Status: AC
  Administered 2021-10-12: 25 mg via INTRAVENOUS
  Filled 2021-10-12: qty 1

## 2021-10-12 MED ORDER — ONDANSETRON HCL 4 MG/2ML IJ SOLN: 4.0000 mg | Freq: Four times a day (QID) | INTRAMUSCULAR | Status: DC | PRN

## 2021-10-12 NOTE — ED Notes (Signed)
Patient to procedure LP

## 2021-10-12 NOTE — ED Provider Notes (Signed)
[facility] Provider Note    Event Date/Time   First MD Initiated Contact with Patient 10/12/21 1341     (approximate)  History   Chief Complaint: Headache  HPI  Joanna Ortiz is a 76 y.o. female with a past medical history of anemia, Crohn's, gastric reflux, hyperlipidemia, presents to the emergency department for severe left-sided headache.  According to the patient and record review (I have reviewed the patient's discharge summary and several inpatient records) patient was admitted to the hospital 10/10/2021 for severe left-sided headache, at that time patient was seen by neurology ultimately had negative work-up including an MRI and the patient was discharged yesterday 10/11/2021.  Husband states since going home patient initially slept but then woke up today again with a significant left-sided headache.  Tried taking a pain pill they were prescribed at home with no improvement so they came to the emergency department.  Here the patient states severe left-sided headache.  Denies any weakness or numbness denies any fever.  Physical Exam   Triage Vital Signs: ED Triage Vitals  Enc Vitals Group     BP 10/12/21 1141 (!) 174/94     Pulse Rate 10/12/21 1141 92     Resp 10/12/21 1141 16     Temp 10/12/21 1141 98.2 F (36.8 C)     Temp src --      SpO2 10/12/21 1141 97 %     Weight --      Height --      Head Circumference --      Peak Flow --      Pain Score 10/12/21 1130 9     Pain Loc --      Pain Edu? --      Excl. in [place]? --     Most recent vital signs: Vitals:   10/12/21 1141  BP: (!) 174/94  Pulse: 92  Resp: 16  Temp: 98.2 F (36.8 C)  SpO2: 97%    General: Awake, mild distress holding left side of her head stating headache. CV:  Good peripheral perfusion.  Regular rate and rhythm  Resp:  Normal effort.  Equal breath sounds bilaterally.  Abd:  No distention.  Soft, nontender.  No rebound or guarding.  ED Results / Procedures /  Treatments   EKG  EKG viewed and interpreted by myself shows a normal sinus rhythm at 95 bpm with a narrow QRS, left axis deviation, largely normal intervals with nonspecific ST changes.  RADIOLOGY  I have reviewed and interpreted the CT head images I do not see any large bleed on my evaluation. Radiology is read the CT scan is negative for acute abnormality.   MEDICATIONS ORDERED IN ED: Medications  ketorolac (TORADOL) 30 MG/ML injection 30 mg (has no administration in time range)  metoCLOPramide (REGLAN) injection 10 mg (has no administration in time range)  diphenhydrAMINE (BENADRYL) injection 25 mg (has no administration in time range)  sodium chloride 0.9 % bolus 500 mL (has no administration in time range)     IMPRESSION / MDM / ASSESSMENT AND PLAN / ED COURSE  I reviewed the triage vital signs and the nursing notes.  Patient's presentation is most consistent with acute presentation with potential threat to life or bodily function.  Patient presents to the emergency department for continued left-sided headache.  Patient states moderate pain to the left head which has been an ongoing issue for quite some time but worse since her admission to the hospital 2  days ago.  Patient had a negative work-up including an MRI was seen by neurology as well.  Overall the patient appears well but she is complaining of a headache.  No other significant or concerning findings on physical exam.  Patient CT scan of the head is negative, lab work overall reassuring including chemistry and CBC.  We will treat the patient's headache attempt to discuss the patient with neurology for any further recommendations.  Patient does have an outpatient appointment scheduled with Dr. Melrose Nakayama already.  I spoke to Dr. Cheral Marker of neurology who actually saw the patient 2 days ago as well, he recommends proceeding with a lumbar puncture for CSF analysis.  I provided me a list of test that he would like performed including  HSV, VDRL, cytology, protein/glucose, fungal culture, IgG index, meningitis panel, cell count.  I spoke with interventional radiology who will be performing the lumbar puncture.  Patient and husband agreeable to plan.  Patient looks much more comfortable after migraine cocktail, states the headache is improving.  Patient care signed out to oncoming provider, lumbar puncture pending.  FINAL CLINICAL IMPRESSION(S) / ED DIAGNOSES   Headache   Note:  This document was prepared using Dragon voice recognition software and may include unintentional dictation errors.   Harvest Dark, MD 10/12/21 1454

## 2021-10-12 NOTE — ED Notes (Signed)
Pt given meds for headache and crackers. Pt asking when provider is going to come and speak to them. Provider notified pt wants to see him

## 2021-10-12 NOTE — ED Triage Notes (Signed)
Pt come with c/o headache, dizziness for few days. Pt was seen here and admitted for same on 8/22. Pt had CT and MRI completed and both neg.  EMS reports pt states left sided pressure to head, no fall or thinners. Pt states 9/10 pain.  Stroke neg for EMS.

## 2021-10-12 NOTE — ED Notes (Signed)
Pt vomiting on way to ED 16 at this time. Pt tearful

## 2021-10-12 NOTE — ED Notes (Signed)
Pt endorses a headache. Provider notified, awaiting orders

## 2021-10-12 NOTE — H&P (Signed)
History and Physical    EMBERLYN BURLISON INO:[id number] DOB: [birth year] DOA: 10/12/2021  PCP: Sharyne Peach, MD  Patient coming from: home  I have personally briefly reviewed patient's old medical records in [place]  Chief Complaint: refractory HA  HPI: CHENISE MULVIHILL is a 76 y.o. female with medical history significant for anemia, ankylosing spondylitis, bilateral carotid artery stenosis, collagen vascular disease, Crohn disease, CAD, DM, GERD, HLD, hypothyroidism, rheumatoid arthritis who has recent interim history of discharged 8/23 with diagnosis of TIA, after patient presented on 8/22 with weakness, left temporal HA and word finding difficulties. Patient at that time had tia/cva evaluation noting negative CT head as well as negative MRI. Patient was also seen by neurology who recommended continuing asa /statin.  Patient per chart was noted to have relief of her HA with tylenol and was discharged with tylenol for symptomatic management. Patient now returns one day later with persistent HA with associated n/v. Patient was treated with migraine cocktail in ED with some relief, however HA was still persistent. Patient was discussed with neurology who recommended LP and admission for neurology follow up in am. Of note patient neuro exam remains non-focal. Patient currently states that her HA is made worse with movement, but improved s/p LP as well as medication given in ED. She also note starting new medication for hx of Chron's disease around same time severe HA began.  ED Course:  Afeb, hr 76, BP 174/94 rr16 sat 97 Labs glucose 108 Wbc 9, hgb 12.8,  plt 178  UA: neg EKG: nsr LAFB SJG:GEZMOQH pending Crytptoccal ag neg Ct head: naD Tx benadryl, reglan , toradol, fiorect Review of Systems: As per HPI otherwise 10 point review of systems negative.   Past Medical History:  Diagnosis Date   Anemia    Ankylosing spondylitis (HCC)    Arthritis    Bilateral carotid artery  stenosis    Collagen vascular disease (HCC)    Coronary artery disease    Crohn disease ([place])    Diabetes mellitus without complication (HCC)    diet controlled   GERD (gastroesophageal reflux disease)    Hyperlipidemia    Hypothyroidism    Pneumonia    3/18   PONV (postoperative nausea and vomiting)    Rheumatoid arthritis ([place])    Thyroid disease    goiter    Past Surgical History:  Procedure Laterality Date   ABDOMINAL HYSTERECTOMY  1987   APPENDECTOMY     BREAST BIOPSY Bilateral    neg   CATARACT EXTRACTION W/PHACO Right 11/06/2016   Procedure: CATARACT EXTRACTION PHACO AND INTRAOCULAR LENS PLACEMENT ([place]);  Surgeon: Birder Robson, MD;  Location: [facility];  Service: Ophthalmology;  Laterality: Right;  [place] 00:42.6 AP% 16.7 CDE 7.14 Fluid Pack Lot # [id number]   CATARACT EXTRACTION W/PHACO Left 12/25/2016   Procedure: CATARACT EXTRACTION PHACO AND INTRAOCULAR LENS PLACEMENT ([facility]);  Surgeon: Birder Robson, MD;  Location: [facility];  Service: Ophthalmology;  Laterality: Left;  [place] 00:32 AP% 13.7 CDE 4.40 Fluid pack lot # [id number]   COLON SURGERY  06/06/2011   Resection of terminal ileum and previous ileocolonic anastomosis for recurrent Crohn's disease. 3 focal strictures identified.   COLONOSCOPY  2013   COLONOSCOPY WITH PROPOFOL N/A 07/28/2021   Procedure: COLONOSCOPY WITH PROPOFOL;  Surgeon: Lesly Rubenstein, MD;  Location: [facility];  Service: Endoscopy;  Laterality: N/A;  IDDM   JOINT REPLACEMENT Left    SHOULDER   LEFT HEART CATH AND CORONARY ANGIOGRAPHY  Left 04/04/2018   Procedure: LEFT HEART CATH AND CORONARY ANGIOGRAPHY;  Surgeon: Corey Skains, MD;  Location: [facility];  Service: Cardiovascular;  Laterality: Left;   SHOULDER SURGERY Right 11/20/2018   Procedure: R SHOULDER ARTHROSCOPY: biceps tenomyotomy, distal claviculectomy, acromioplasty, posterior capsule release, debridement, decrompression of subacromial space, coracoacromial release,  bursectomy   SMALL INTESTINE SURGERY  2012   THYROID SURGERY  1992   TONSILLECTOMY       reports that she quit smoking about 52 years ago. Her smoking use included cigarettes. She has a 12.00 pack-year smoking history. She has never used smokeless tobacco. She reports that she does not drink alcohol and does not use drugs.  Allergies  Allergen Reactions   Erythromycin Hives   Hydromorphone Hives   Cefuroxime Nausea And Vomiting   Ciprocinonide [Fluocinolone]     Muscle pain    Codeine     Other reaction(s): Vomiting   Floxin [Ofloxacin] Nausea And Vomiting   Keflex [Cephalexin] Nausea And Vomiting   Levofloxacin Nausea And Vomiting   Sulfa Antibiotics Nausea And Vomiting   Ciprofloxacin Nausea And Vomiting    Family History  Problem Relation Age of Onset   Breast cancer Sister 29   Crohn's disease Mother    Heart attack Father 18    Prior to Admission medications   Medication Sig Start Date End Date Taking? Authorizing Provider  acetaminophen (TYLENOL) 500 MG tablet Take 1,000 mg by mouth every 8 (eight) hours as needed for mild pain or moderate pain.    [provider]  Ascorbic Acid (VITAMIN C) 1000 MG tablet Take 1,000 mg by mouth 3 (three) times a week.     [provider]  aspirin EC 81 MG tablet Take 1 tablet (81 mg total) by mouth daily. Swallow whole. 10/11/21   Sharen Hones, MD  atorvastatin (LIPITOR) 40 MG tablet Take 1 tablet (40 mg total) by mouth daily. 10/12/21   Sharen Hones, MD  budesonide (ENTOCORT EC) 3 MG 24 hr capsule Take 9 mg by mouth daily. 08/01/21   [provider]  cholestyramine light (PREVALITE) 4 g packet Take 1 packet by mouth daily. 03/22/21   [provider]  CINNAMON PO Take 1,200 mg by mouth daily.    [provider]  cyanocobalamin (,VITAMIN B-12,) 1000 MCG/ML injection Inject 1,000 mcg into the muscle every 30 (thirty) days.    [provider]  Insulin Lispro w/ Trans Port 100 UNIT/ML SOPN  SSI  2 units 150-200 4 units-201-250 6 units 251-300 8 units 301-350 10 units-351-400 >400 call MD 05/24/20   [provider]  loratadine (CLARITIN) 10 MG tablet Take 10 mg by mouth daily.    [provider]  magnesium oxide (MAG-OX) 400 MG tablet Take 400 mg by mouth daily.    [provider]  ondansetron (ZOFRAN-ODT) 4 MG disintegrating tablet Take by mouth. 08/30/21   [provider]  Potassium Chloride 40 MEQ/15ML (20%) SOLN Take 15 ml by mouth once a week. 09/07/21 03/30/22  [provider]  SYNTHROID 100 MCG tablet Take 100 mcg by mouth daily before breakfast. Take 6 days per week. 04/27/14   [provider]  THERATEARS 0.25 % SOLN Place 1 drop into both eyes 3 (three) times daily as needed (dry/irritated eyes.).    [provider]  thyroid (ARMOUR) 30 MG tablet Take 30 mg by mouth daily before breakfast.     [provider]  Turmeric 500 MG CAPS Take  500 mg by mouth daily.    [provider]  Upadacitinib ER (RINVOQ) 15 MG TB24 Take 15 mg by mouth daily. 09/29/21   [provider]    Physical Exam: Vitals:   10/12/21 1930 10/12/21 2000 10/12/21 2030 10/12/21 2100  BP: (!) 158/84 (!) 160/72 (!) 163/82 (!) 165/85  Pulse: 88 82 89 91  Resp:  18  18  Temp:      TempSrc:      SpO2: 97% 98% 96% 95%     Vitals:   10/12/21 1930 10/12/21 2000 10/12/21 2030 10/12/21 2100  BP: (!) 158/84 (!) 160/72 (!) 163/82 (!) 165/85  Pulse: 88 82 89 91  Resp:  18  18  Temp:      TempSrc:      SpO2: 97% 98% 96% 95%  Constitutional: NAD, calm, comfortable Eyes: PERRL, lids and conjunctivae normal ENMT: Mucous membranes are moist. Posterior pharynx clear of any exudate or lesions.Normal dentition.  Neck: normal, supple, no masses, no thyromegaly Respiratory: clear to auscultation bilaterally, no wheezing, no crackles. Normal respiratory effort. No accessory muscle use.  Cardiovascular: Regular rate and rhythm, no  murmurs / rubs / gallops. No extremity edema. 2+ pedal pulses.  Abdomen: no tenderness, no masses palpated. No hepatosplenomegaly. Bowel sounds positive.  Musculoskeletal: no clubbing / cyanosis. No joint deformity upper and lower extremities. Good ROM, no contractures. Normal muscle tone.  Skin: no rashes, lesions, ulcers. No induration Neurologic: CN 2-12 grossly intact. Sensation intact,  Strength 5/5 in all 4.  Psychiatric: Normal judgment and insight. Alert and oriented x 3. Normal mood.    Labs on Admission: I have personally reviewed following labs and imaging studies  CBC: Recent Labs  Lab 10/10/21 1922 10/11/21 0608 10/12/21 1147  WBC 8.1 10.0 9.0  NEUTROABS 3.2  --   --   HGB 11.8* 11.6* 12.8  HCT 36.4 36.4 39.9  MCV 89.4 88.6 88.3  PLT 180 168 270   Basic Metabolic Panel: Recent Labs  Lab 10/10/21 1922 10/11/21 0608 10/12/21 1147  NA 138 141 139  K 3.7 3.4* 3.6  CL 103 107 104  CO2 '27 28 24  '$ GLUCOSE 138* 145* 183*  BUN '21 18 15  '$ CREATININE 1.01* 0.94 0.82  CALCIUM 8.1* 7.8* 8.4*   GFR: Estimated Creatinine Clearance: 64.4 mL/min (by C-G formula based on SCr of 0.82 mg/dL). Liver Function Tests: Recent Labs  Lab 10/10/21 1922 10/11/21 0608  AST 32 30  ALT 47* 43  ALKPHOS 105 103  BILITOT 0.6 0.4  PROT 6.4* 6.4*  ALBUMIN 3.4* 3.3*   No results for input(s): "LIPASE", "AMYLASE" in the last 168 hours. No results for input(s): "AMMONIA" in the last 168 hours. Coagulation Profile: Recent Labs  Lab 10/10/21 1922  INR 1.1   Cardiac Enzymes: No results for input(s): "CKTOTAL", "CKMB", "CKMBINDEX", "TROPONINI" in the last 168 hours. BNP (last 3 results) No results for input(s): "PROBNP" in the last 8760 hours. HbA1C: No results for input(s): "HGBA1C" in the last 72 hours. CBG: Recent Labs  Lab 10/10/21 1906 10/11/21 0017 10/11/21 0742 10/11/21 1207 10/11/21 1537  GLUCAP 146* 135* 136* 122* 108*   Lipid Profile: Recent Labs     10/11/21 0608  CHOL 134  HDL 31*  LDLCALC 86  TRIG 84  CHOLHDL 4.3   Thyroid Function Tests: No results for input(s): "TSH", "T4TOTAL", "FREET4", "T3FREE", "THYROIDAB" in the last 72 hours. Anemia Panel: No results for input(s): "VITAMINB12", "FOLATE", "FERRITIN", "TIBC", "IRON", "RETICCTPCT" in  the last 72 hours. Urine analysis:    Component Value Date/Time   COLORURINE YELLOW (A) 10/12/2021 1147   APPEARANCEUR CLEAR (A) 10/12/2021 1147   APPEARANCEUR Clear 03/06/2011 0952   LABSPEC 1.012 10/12/2021 1147   LABSPEC 1.002 03/06/2011 0952   PHURINE 5.0 10/12/2021 1147   GLUCOSEU NEGATIVE 10/12/2021 1147   GLUCOSEU Negative 03/06/2011 0952   HGBUR NEGATIVE 10/12/2021 1147   BILIRUBINUR NEGATIVE 10/12/2021 1147   BILIRUBINUR Negative 03/06/2011 0952   KETONESUR NEGATIVE 10/12/2021 1147   PROTEINUR NEGATIVE 10/12/2021 1147   UROBILINOGEN 0.2 08/24/2020 1900   NITRITE NEGATIVE 10/12/2021 1147   LEUKOCYTESUR NEGATIVE 10/12/2021 1147   LEUKOCYTESUR Negative 03/06/2011 0952    Radiological Exams on Admission: DG FL GUIDED LUMBAR PUNCTURE  Result Date: 10/12/2021 CLINICAL DATA:  Patient with persistent headache request received for diagnostic lumbar puncture. EXAM: DIAGNOSTIC LUMBAR PUNCTURE UNDER FLUOROSCOPIC GUIDANCE COMPARISON:  MRI brain performed October 10, 2021 reviewed prior to the procedure. FLUOROSCOPY TIME:  Radiation Exposure Index (if provided by the fluoroscopic device): 3.00 mGy PROCEDURE: Informed consent was obtained from the patient prior to the procedure, including potential complications of bleeding, infection, paresthesias, nerve damage, CSF leak requiring additional procedures, post procedure requirement to lay flat for several hours after the procedure, headache, allergy, and pain. With the patient prone, the lower back was prepped with Betadine. 1% Lidocaine was used for local anesthesia. Lumbar puncture was performed at the L3-L4 level using a 20 gauge needle with  return of colorless CSF with an opening pressure of 19 cm water. 8 ml of CSF were obtained for laboratory studies. The inner stylet was placed back into the needle and the needle was removed in its entirety. The patient tolerated the procedure well and there were no apparent complications. A sterile bandage was applied. IMPRESSION: Technically successful fluoroscopic guided lumbar puncture. This exam was performed by Tsosie Billing PA-C, and was supervised and interpreted by Dr. Posey Pronto. Electronically Signed   By: Macy Mis M.D.   On: 10/12/2021 16:11   CT HEAD WO CONTRAST  Result Date: 10/12/2021 CLINICAL DATA:  Headaches, dizziness EXAM: CT HEAD WITHOUT CONTRAST TECHNIQUE: Contiguous axial images were obtained from the base of the skull through the vertex without intravenous contrast. RADIATION DOSE REDUCTION: This exam was performed according to the departmental dose-optimization program which includes automated exposure control, adjustment of the mA and/or kV according to patient size and/or use of iterative reconstruction technique. COMPARISON:  10/10/2021 FINDINGS: Brain: No acute intracranial findings are seen. There are no signs of bleeding within the cranium. Cortical sulci are prominent. Vascular: Unremarkable. Skull: Unremarkable. Sinuses/Orbits: Unremarkable. Other: No significant interval changes are noted. IMPRESSION: No acute intracranial findings are seen. Atrophy. No significant interval changes are noted. Electronically Signed   By: Elmer Picker M.D.   On: 10/12/2021 12:16   ECHOCARDIOGRAM COMPLETE  Result Date: 10/11/2021    ECHOCARDIOGRAM REPORT   Patient Name:   MATIKA BARTELL Date of Exam: 10/11/2021 Medical Rec #:  [id number]        Height:       68.0 in Accession #:    [id number]       Weight:       173.9 lb Date of Birth:  March 29, 1945       BSA:          1.926 m Patient Age:    39 years         BP:  153/77 mmHg Patient Gender: F                HR:           81 bpm.  Exam Location:  [facility] Procedure: 2D Echo, Color Doppler and Cardiac Doppler Indications:     G45.9 TIA  History:         Patient has no prior history of Echocardiogram examinations.                  CAD, Arrythmias:LBBB; Risk Factors:Dyslipidemia and Diabetes.  Sonographer:     Charmayne Sheer Referring Phys:  [place] Diagnosing Phys: Yolonda Kida MD  Sonographer Comments: No subcostal window. IMPRESSIONS  1. Left ventricular ejection fraction, by estimation, is 50 to 55%. The left ventricle has low normal function. The left ventricle demonstrates global hypokinesis. Left ventricular diastolic parameters are consistent with Grade I diastolic dysfunction (impaired relaxation).  2. Right ventricular systolic function is normal. The right ventricular size is normal.  3. The mitral valve is normal in structure. No evidence of mitral valve regurgitation.  4. The aortic valve is grossly normal. Aortic valve regurgitation is not visualized. FINDINGS  Left Ventricle: Left ventricular ejection fraction, by estimation, is 50 to 55%. The left ventricle has low normal function. The left ventricle demonstrates global hypokinesis. The left ventricular internal cavity size was normal in size. There is no left ventricular hypertrophy. Left ventricular diastolic parameters are consistent with Grade I diastolic dysfunction (impaired relaxation). Right Ventricle: The right ventricular size is normal. No increase in right ventricular wall thickness. Right ventricular systolic function is normal. Left Atrium: Left atrial size was normal in size. Right Atrium: Right atrial size was normal in size. Pericardium: There is no evidence of pericardial effusion. Mitral Valve: The mitral valve is normal in structure. There is mild thickening of the mitral valve leaflet(s). Mild mitral annular calcification. No evidence of mitral valve regurgitation. Tricuspid Valve: The tricuspid valve is normal in structure. Tricuspid valve  regurgitation is trivial. Aortic Valve: The aortic valve is grossly normal. Aortic valve regurgitation is not visualized. Aortic valve mean gradient measures 5.0 mmHg. Aortic valve peak gradient measures 8.3 mmHg. Aortic valve area, by VTI measures 1.91 cm. Pulmonic Valve: The pulmonic valve was normal in structure. Pulmonic valve regurgitation is not visualized. Aorta: The ascending aorta was not well visualized. IAS/Shunts: No atrial level shunt detected by color flow Doppler. Additional Comments: There is no pleural effusion.  LEFT VENTRICLE PLAX 2D LVIDd:         4.80 cm     Diastology LVIDs:         3.65 cm     LV e' medial:    4.24 cm/s LV PW:         1.20 cm     LV E/e' medial:  23.4 LV IVS:        0.73 cm     LV e' lateral:   4.90 cm/s LVOT diam:     2.00 cm     LV E/e' lateral: 20.3 LV SV:         56 LV SV Index:   29 LVOT Area:     3.14 cm  LV Volumes (MOD) LV vol d, MOD A2C: 81.5 ml LV vol d, MOD A4C: 96.5 ml LV vol s, MOD A2C: 44.5 ml LV vol s, MOD A4C: 49.4 ml LV SV MOD A2C:     37.0 ml LV SV MOD A4C:  96.5 ml LV SV MOD BP:      43.1 ml RIGHT VENTRICLE RV Basal diam:  2.81 cm LEFT ATRIUM             Index        RIGHT ATRIUM           Index LA diam:        3.20 cm 1.66 cm/m   RA Area:     11.00 cm LA Vol (A2C):   29.6 ml 15.37 ml/m  RA Volume:   19.30 ml  10.02 ml/m LA Vol (A4C):   36.3 ml 18.85 ml/m LA Biplane Vol: 33.6 ml 17.45 ml/m  AORTIC VALVE AV Area (Vmax):    1.79 cm AV Area (Vmean):   1.75 cm AV Area (VTI):     1.91 cm AV Vmax:           144.00 cm/s AV Vmean:          106.000 cm/s AV VTI:            0.291 m AV Peak Grad:      8.3 mmHg AV Mean Grad:      5.0 mmHg LVOT Vmax:         82.20 cm/s LVOT Vmean:        59.200 cm/s LVOT VTI:          0.177 m LVOT/AV VTI ratio: 0.61  AORTA Ao Root diam: 3.00 cm MITRAL VALVE MV Area (PHT): 4.54 cm     SHUNTS MV Decel Time: 167 msec     Systemic VTI:  0.18 m MV E velocity: 99.40 cm/s   Systemic Diam: 2.00 cm MV A velocity: 130.00 cm/s MV E/A  ratio:  0.76 Dwayne D Callwood MD Electronically signed by Yolonda Kida MD Signature Date/Time: 10/11/2021/3:58:07 PM    Final    MR BRAIN WO CONTRAST  Result Date: 10/10/2021 CLINICAL DATA:  Altered mental status EXAM: MRI HEAD WITHOUT CONTRAST TECHNIQUE: Multiplanar, multiecho pulse sequences of the brain and surrounding structures were obtained without intravenous contrast. COMPARISON:  08/19/2021 brain MRI FINDINGS: Brain: No acute infarct, mass effect or extra-axial collection. No acute or chronic hemorrhage. There is multifocal hyperintense T2-weighted signal within the white matter. Parenchymal volume and CSF spaces are normal. The midline structures are normal. Vascular: Major flow voids are preserved. Skull and upper cervical spine: Normal calvarium and skull base. Visualized upper cervical spine and soft tissues are normal. Sinuses/Orbits:No paranasal sinus fluid levels or advanced mucosal thickening. No mastoid or middle ear effusion. Normal orbits. IMPRESSION: 1. No acute intracranial abnormality. 2. Findings of chronic microvascular ischemia. Electronically Signed   By: Ulyses Jarred M.D.   On: 10/10/2021 23:41    EKG: Independently reviewed.   Assessment/Plan  Intractable temporal HA -possible migraine syndrome  - continue with prn migraine cocktail  -f/u with neuro recs in am Dr Jonette Eva -neuro checks  -admit to tele due to recent hx of TIA s/xs    TIA -recent admit -continue asa/statin  -monitor on tele  Anemia -h/h stable    Ankylosing spondylitis RA  -resume home regimen   Crohn disease -continue home treatment  -no current flare     CAD -no active complaints     DM -iss/fs    GERD -ppi    HLD -resume statin    Hypothyroidism -continue synthroid  DVT prophylaxis: heparin Code Status: full Family Communication: none at bedside Disposition Plan:patient  expected to be admitted greater than 2  midnights Consults called: neurology dr  Jonette Eva Admission status: med tele   Clance Boll MD [facility]   If 7PM-7AM, please contact night-coverage [email or web address] Password [facility]  10/12/2021, 9:25 PM

## 2021-10-13 ENCOUNTER — Encounter: Payer: Self-pay | Admitting: Internal Medicine

## 2021-10-13 DIAGNOSIS — R519 Headache, unspecified: Secondary | ICD-10-CM | POA: Diagnosis not present

## 2021-10-13 LAB — GLUCOSE, CAPILLARY: Glucose-Capillary: 142 mg/dL — ABNORMAL HIGH (ref 70–99)

## 2021-10-13 LAB — SEDIMENTATION RATE: Sed Rate: 15 mm/hr (ref 0–30)

## 2021-10-13 LAB — C-REACTIVE PROTEIN: CRP: 0.6 mg/dL (ref <1.0)

## 2021-10-13 MED ORDER — CHOLESTYRAMINE LIGHT 4 G PO PACK
1.0000 | PACK | Freq: Every day | ORAL | Status: DC
Start: 2021-10-13 — End: 2021-10-13
  Administered 2021-10-13: 1 via ORAL
  Filled 2021-10-13: qty 1

## 2021-10-13 MED ORDER — LABETALOL HCL 5 MG/ML IV SOLN
20.0000 mg | INTRAVENOUS | Status: DC | PRN
  Administered 2021-10-13: 20 mg via INTRAVENOUS
  Filled 2021-10-13: qty 4

## 2021-10-13 MED ORDER — ATORVASTATIN CALCIUM 20 MG PO TABS
40.0000 mg | ORAL_TABLET | Freq: Every day | ORAL | Status: DC
  Administered 2021-10-13: 40 mg via ORAL
  Filled 2021-10-13: qty 2

## 2021-10-13 MED ORDER — BUTALBITAL-APAP-CAFFEINE 50-325-40 MG PO TABS: 1.0000 | ORAL_TABLET | Freq: Four times a day (QID) | ORAL | 0 refills | Status: DC | PRN

## 2021-10-13 MED ORDER — LEVOTHYROXINE SODIUM 100 MCG PO TABS
100.0000 ug | ORAL_TABLET | ORAL | Status: DC
  Administered 2021-10-13: 100 ug via ORAL
  Filled 2021-10-13: qty 1

## 2021-10-13 MED ORDER — ASPIRIN 81 MG PO TBEC
81.0000 mg | DELAYED_RELEASE_TABLET | Freq: Every day | ORAL | Status: DC
  Administered 2021-10-13: 81 mg via ORAL
  Filled 2021-10-13: qty 1

## 2021-10-13 MED ORDER — POTASSIUM CHLORIDE CRYS ER 20 MEQ PO TBCR
40.0000 meq | EXTENDED_RELEASE_TABLET | Freq: Once | ORAL | Status: AC
  Administered 2021-10-13: 40 meq via ORAL
  Filled 2021-10-13: qty 2

## 2021-10-13 MED ORDER — BUTALBITAL-APAP-CAFFEINE 50-325-40 MG PO TABS: 1.0000 | ORAL_TABLET | Freq: Four times a day (QID) | ORAL | Status: DC | PRN

## 2021-10-13 MED ORDER — BUDESONIDE 3 MG PO CPEP
9.0000 mg | ORAL_CAPSULE | Freq: Every day | ORAL | Status: DC
  Administered 2021-10-13: 9 mg via ORAL
  Filled 2021-10-13: qty 3

## 2021-10-13 MED ORDER — UPADACITINIB ER 15 MG PO TB24: 15.0000 mg | ORAL_TABLET | Freq: Every day | ORAL | Status: DC

## 2021-10-13 NOTE — Consult Note (Signed)
NEURO HOSPITALIST CONSULT NOTE   Requesting physician: Dr. Arbutus Ped  Reason for Consult: Persistent severe headache in conjunction with dizziness  History obtained from:   Patient and Chart     HPI:                                                                                                                                          Joanna Ortiz is an 76 y.o. female who re-presents to the ED with persistent severe headache. She was discharged on Wednesday after management of a severe headache following presentation with stroke like symptoms. Her exam on Wednesday during the prior admission had exhibited multiple functional attributes suggestive of conversion disorder, secondary gain or stress-related symptoms in the context of her severe 7/10 headache at that time. Stroke was ruled out by MRI and CTA was reassuring, with mild atherosclerosis in the head and neck and no LVO. Patient was noted to have relief of her HA with Tylenol and was discharged with Tylenol for symptomatic management.   On re-presenting to the ED yesterday, she complained of headache and dizziness, specifically with left sided pressure to her head rated as 9/10 on the pain scale. She was vomiting and was also tearful. She endorsed worsening of her headache with movement. Per EDP note: "Husband states since going home patient initially slept but then woke up today again with a significant left-sided headache.  Tried taking a pain pill they were prescribed at home with no improvement so they came to the emergency department.  Here the patient states severe left-sided headache.  Denies any weakness or numbness denies any fever."  Per my consult note last admission, dated 10/10/21: "Joanna Ortiz is a 76 y.o. female with a PMHx of anemia, ankylosing spondylitis, bilateral carotid artery stenosis, collagen vascular disease, Crohn disease, CAD, DM, GERD, HLD, hypothyroidism, rheumatoid arthritis and a prior  recent ED visit and Teleneurology consult to assess for acute onset of weakness, who presents to the ED today with acute onset of slurred speech, right facial droop and BLE weakness in conjunction with left retroorbital headache rated at 7/10. She was last normal at 1400 when she started to take a nap. She awoke about 2 hours later with the above symptoms. She has also had dizziness for at least several months which is worsened by lying on her right side. Modified Rankin score 0".  LP was performed in the ED. Headache pain improved afterwards but unclear if this was due to CSF drainage or pain medication given in the ED. CSF results so far are unremarkable. Of note, she did start a new medication for hx of Chron's disease around same time severe HA began.  Past Medical History:  Diagnosis Date   Anemia    Ankylosing spondylitis (  [place])    Arthritis    Bilateral carotid artery stenosis    Collagen vascular disease ([facility])    Coronary artery disease    Crohn disease ([place])    Diabetes mellitus without complication ([place])    diet controlled   GERD (gastroesophageal reflux disease)    Hyperlipidemia    Hypothyroidism    Pneumonia    3/18   PONV (postoperative nausea and vomiting)    Rheumatoid arthritis ([place])    Thyroid disease    goiter    Past Surgical History:  Procedure Laterality Date   ABDOMINAL HYSTERECTOMY  1987   APPENDECTOMY     BREAST BIOPSY Bilateral    neg   CATARACT EXTRACTION W/PHACO Right 11/06/2016   Procedure: CATARACT EXTRACTION PHACO AND INTRAOCULAR LENS PLACEMENT ([place]);  Surgeon: Birder Robson, MD;  Location: [facility];  Service: Ophthalmology;  Laterality: Right;  [place] 00:42.6 AP% 16.7 CDE 7.14 Fluid Pack Lot # [id number]   CATARACT EXTRACTION W/PHACO Left 12/25/2016   Procedure: CATARACT EXTRACTION PHACO AND INTRAOCULAR LENS PLACEMENT (IOC);  Surgeon: Birder Robson, MD;  Location: [facility];  Service: Ophthalmology;  Laterality: Left;  [place] 00:32 AP% 13.7 CDE  4.40 Fluid pack lot # [id number]   COLON SURGERY  06/06/2011   Resection of terminal ileum and previous ileocolonic anastomosis for recurrent Crohn's disease. 3 focal strictures identified.   COLONOSCOPY  2013   COLONOSCOPY WITH PROPOFOL N/A 07/28/2021   Procedure: COLONOSCOPY WITH PROPOFOL;  Surgeon: Lesly Rubenstein, MD;  Location: [facility];  Service: Endoscopy;  Laterality: N/A;  IDDM   JOINT REPLACEMENT Left    SHOULDER   LEFT HEART CATH AND CORONARY ANGIOGRAPHY Left 04/04/2018   Procedure: LEFT HEART CATH AND CORONARY ANGIOGRAPHY;  Surgeon: Corey Skains, MD;  Location: [facility];  Service: Cardiovascular;  Laterality: Left;   SHOULDER SURGERY Right 11/20/2018   Procedure: R SHOULDER ARTHROSCOPY: biceps tenomyotomy, distal claviculectomy, acromioplasty, posterior capsule release, debridement, decrompression of subacromial space, coracoacromial release, bursectomy   SMALL INTESTINE SURGERY  2012   THYROID SURGERY  1992   TONSILLECTOMY      Family History  Problem Relation Age of Onset   Breast cancer Sister 25   Crohn's disease Mother    Heart attack Father 56             Social History:  reports that she quit smoking about 52 years ago. Her smoking use included cigarettes. She has a 12.00 pack-year smoking history. She has never used smokeless tobacco. She reports that she does not drink alcohol and does not use drugs.  Allergies  Allergen Reactions   Erythromycin Hives   Hydromorphone Hives   Cefuroxime Nausea And Vomiting   Ciprocinonide [Fluocinolone]     Muscle pain    Codeine     Other reaction(s): Vomiting   Floxin [Ofloxacin] Nausea And Vomiting   Keflex [Cephalexin] Nausea And Vomiting   Levofloxacin Nausea And Vomiting   Sulfa Antibiotics Nausea And Vomiting   Ciprofloxacin Nausea And Vomiting    MEDICATIONS:  Prior to  Admission:  Medications Prior to Admission  Medication Sig Dispense Refill Last Dose   Ascorbic Acid (VITAMIN C) 1000 MG tablet Take 1,000 mg by mouth 3 (three) times a week.    Past Week   aspirin EC 81 MG tablet Take 1 tablet (81 mg total) by mouth daily. Swallow whole. 30 tablet 0 10/12/2021 at 0800   atorvastatin (LIPITOR) 40 MG tablet Take 1 tablet (40 mg total) by mouth daily. 30 tablet 0 10/11/2021   budesonide (ENTOCORT EC) 3 MG 24 hr capsule Take 9 mg by mouth daily.   10/12/2021 at 0800   cholestyramine light (PREVALITE) 4 g packet Take 1 packet by mouth daily.   10/12/2021 at 0800   CINNAMON PO Take 1,200 mg by mouth daily.   10/12/2021 at 0800   cyanocobalamin (,VITAMIN B-12,) 1000 MCG/ML injection Inject 1,000 mcg into the muscle every 30 (thirty) days.   Past Month   magnesium oxide (MAG-OX) 400 MG tablet Take 400 mg by mouth daily.   10/12/2021 at 0800   Potassium Chloride 40 MEQ/15ML (20%) SOLN Take 15 ml by mouth once a week.   Past Week   SYNTHROID 100 MCG tablet Take 100 mcg by mouth daily before breakfast. Take 6 days per week.  11 10/12/2021 at 0600   thyroid (ARMOUR) 30 MG tablet Take 30 mg by mouth daily before breakfast.    10/12/2021 at 0600   Turmeric 500 MG CAPS Take 500 mg by mouth daily.   Past Week   Upadacitinib ER (RINVOQ) 15 MG TB24 Take 15 mg by mouth daily.   10/12/2021   acetaminophen (TYLENOL) 500 MG tablet Take 1,000 mg by mouth every 8 (eight) hours as needed for mild pain or moderate pain.   prn at prn   Insulin Lispro w/ Trans Port 100 UNIT/ML SOPN SSI  2 units 150-200 4 units-201-250 6 units 251-300 8 units 301-350 10 units-351-400 >400 call MD   prn at prn   loratadine (CLARITIN) 10 MG tablet Take 10 mg by mouth daily.   prn at prn   ondansetron (ZOFRAN-ODT) 4 MG disintegrating tablet Take by mouth.   prn at prn   THERATEARS 0.25 % SOLN Place 1 drop into both eyes 3 (three) times daily as needed (dry/irritated eyes.).   prn at prn   Scheduled:  aspirin EC  81  mg Oral Daily   atorvastatin  40 mg Oral Daily   budesonide  9 mg Oral Daily   cholestyramine light  1 packet Oral Daily   heparin  5,000 Units Subcutaneous Q8H   insulin aspart  0-9 Units Subcutaneous TID WC   levothyroxine  100 mcg Oral Once per day on Mon Tue Wed Thu Fri Sat   Upadacitinib ER  15 mg Oral Daily   Continuous:   ROS:  The patient feels much better today, rating her headache at 4/10.    Blood pressure 131/73, pulse 81, temperature 98.7 F (37.1 C), resp. rate 17, SpO2 97 %.   General Examination:                                                                                                       Physical Exam  HEENT-  [place]/AT  Lungs- Respirations unlabored Extremities- No edema   Neurological Examination Mental Status: Awake and alert. Affect is cheerful. Speech fluent without evidence of aphasia.  Able to answer all questions without difficulty. Cranial Nerves: II, III,IV, VI: Fixates and tracks normally. No ptosis.  VII: Slight facial asymmetry is significantly improved since my assessment last admission.  VIII: Hearing intact to voice IX,X: No hypophonia or hoarseness XI: Symmetric Motor:  Right : Upper extremity   5/5    Left:     Upper extremity   5/5  Lower extremity   5/5     Lower extremity   5/5 Tone and bulk:normal tone throughout; no atrophy noted Deep Tendon Reflexes: 1+ bilateral brachioradialis. Difficult to elicit patellars.  Cerebellar: No ataxia noted.  Gait: Deferred   Lab Results: Basic Metabolic Panel: Recent Labs  Lab 10/10/21 1922 10/11/21 0608 10/12/21 1147 10/12/21 2300  NA 138 141 139 137  K 3.7 3.4* 3.6 3.2*  CL 103 107 104 105  CO2 '27 28 24 24  '$ GLUCOSE 138* 145* 183* 151*  BUN '21 18 15 15  '$ CREATININE 1.01* 0.94 0.82 0.71  CALCIUM 8.1* 7.8* 8.4* 8.2*  MG  --   --   --  2.6*  PHOS  --    --   --  4.5    CBC: Recent Labs  Lab 10/10/21 1922 10/11/21 0608 10/12/21 1147 10/12/21 2300  WBC 8.1 10.0 9.0 9.8  NEUTROABS 3.2  --   --  6.5  HGB 11.8* 11.6* 12.8 11.8*  HCT 36.4 36.4 39.9 36.0  MCV 89.4 88.6 88.3 87.2  PLT 180 168 178 171    Cardiac Enzymes: No results for input(s): "CKTOTAL", "CKMB", "CKMBINDEX", "TROPONINI" in the last 168 hours.  Lipid Panel: Recent Labs  Lab 10/11/21 0608  CHOL 134  TRIG 84  HDL 31*  CHOLHDL 4.3  VLDL 17  LDLCALC 86    Imaging: DG FL GUIDED LUMBAR PUNCTURE  Result Date: 10/12/2021 CLINICAL DATA:  Patient with persistent headache request received for diagnostic lumbar puncture. EXAM: DIAGNOSTIC LUMBAR PUNCTURE UNDER FLUOROSCOPIC GUIDANCE COMPARISON:  MRI brain performed October 10, 2021 reviewed prior to the procedure. FLUOROSCOPY TIME:  Radiation Exposure Index (if provided by the fluoroscopic device): 3.00 mGy PROCEDURE: Informed consent was obtained from the patient prior to the procedure, including potential complications of bleeding, infection, paresthesias, nerve damage, CSF leak requiring additional procedures, post procedure requirement to lay flat for several hours after the procedure, headache, allergy, and pain. With the patient prone, the lower back was prepped with Betadine. 1% Lidocaine was used for local anesthesia. Lumbar puncture was performed at the L3-L4 level using a 20 gauge  needle with return of colorless CSF with an opening pressure of 19 cm water. 8 ml of CSF were obtained for laboratory studies. The inner stylet was placed back into the needle and the needle was removed in its entirety. The patient tolerated the procedure well and there were no apparent complications. A sterile bandage was applied. IMPRESSION: Technically successful fluoroscopic guided lumbar puncture. This exam was performed by Tsosie Billing PA-C, and was supervised and interpreted by Dr. Posey Pronto. Electronically Signed   By: Macy Mis M.D.   On:  10/12/2021 16:11   CT HEAD WO CONTRAST  Result Date: 10/12/2021 CLINICAL DATA:  Headaches, dizziness EXAM: CT HEAD WITHOUT CONTRAST TECHNIQUE: Contiguous axial images were obtained from the base of the skull through the vertex without intravenous contrast. RADIATION DOSE REDUCTION: This exam was performed according to the departmental dose-optimization program which includes automated exposure control, adjustment of the mA and/or kV according to patient size and/or use of iterative reconstruction technique. COMPARISON:  10/10/2021 FINDINGS: Brain: No acute intracranial findings are seen. There are no signs of bleeding within the cranium. Cortical sulci are prominent. Vascular: Unremarkable. Skull: Unremarkable. Sinuses/Orbits: Unremarkable. Other: No significant interval changes are noted. IMPRESSION: No acute intracranial findings are seen. Atrophy. No significant interval changes are noted. Electronically Signed   By: Elmer Picker M.D.   On: 10/12/2021 12:16   ECHOCARDIOGRAM COMPLETE  Result Date: 10/11/2021    ECHOCARDIOGRAM REPORT   Patient Name:   Joanna Ortiz Date of Exam: 10/11/2021 Medical Rec #:  [id number]        Height:       68.0 in Accession #:    [id number]       Weight:       173.9 lb Date of Birth:  1946-01-31       BSA:          1.926 m Patient Age:    63 years         BP:           153/77 mmHg Patient Gender: F                HR:           81 bpm. Exam Location:  [facility] Procedure: 2D Echo, Color Doppler and Cardiac Doppler Indications:     G45.9 TIA  History:         Patient has no prior history of Echocardiogram examinations.                  CAD, Arrythmias:LBBB; Risk Factors:Dyslipidemia and Diabetes.  Sonographer:     Charmayne Sheer Referring Phys:  [place] Diagnosing Phys: Yolonda Kida MD  Sonographer Comments: No subcostal window. IMPRESSIONS  1. Left ventricular ejection fraction, by estimation, is 50 to 55%. The left ventricle has low normal function. The left  ventricle demonstrates global hypokinesis. Left ventricular diastolic parameters are consistent with Grade I diastolic dysfunction (impaired relaxation).  2. Right ventricular systolic function is normal. The right ventricular size is normal.  3. The mitral valve is normal in structure. No evidence of mitral valve regurgitation.  4. The aortic valve is grossly normal. Aortic valve regurgitation is not visualized. FINDINGS  Left Ventricle: Left ventricular ejection fraction, by estimation, is 50 to 55%. The left ventricle has low normal function. The left ventricle demonstrates global hypokinesis. The left ventricular internal cavity size was normal in size. There is no left ventricular hypertrophy. Left ventricular diastolic parameters are  consistent with Grade I diastolic dysfunction (impaired relaxation). Right Ventricle: The right ventricular size is normal. No increase in right ventricular wall thickness. Right ventricular systolic function is normal. Left Atrium: Left atrial size was normal in size. Right Atrium: Right atrial size was normal in size. Pericardium: There is no evidence of pericardial effusion. Mitral Valve: The mitral valve is normal in structure. There is mild thickening of the mitral valve leaflet(s). Mild mitral annular calcification. No evidence of mitral valve regurgitation. Tricuspid Valve: The tricuspid valve is normal in structure. Tricuspid valve regurgitation is trivial. Aortic Valve: The aortic valve is grossly normal. Aortic valve regurgitation is not visualized. Aortic valve mean gradient measures 5.0 mmHg. Aortic valve peak gradient measures 8.3 mmHg. Aortic valve area, by VTI measures 1.91 cm. Pulmonic Valve: The pulmonic valve was normal in structure. Pulmonic valve regurgitation is not visualized. Aorta: The ascending aorta was not well visualized. IAS/Shunts: No atrial level shunt detected by color flow Doppler. Additional Comments: There is no pleural effusion.  LEFT VENTRICLE  PLAX 2D LVIDd:         4.80 cm     Diastology LVIDs:         3.65 cm     LV e' medial:    4.24 cm/s LV PW:         1.20 cm     LV E/e' medial:  23.4 LV IVS:        0.73 cm     LV e' lateral:   4.90 cm/s LVOT diam:     2.00 cm     LV E/e' lateral: 20.3 LV SV:         56 LV SV Index:   29 LVOT Area:     3.14 cm  LV Volumes (MOD) LV vol d, MOD A2C: 81.5 ml LV vol d, MOD A4C: 96.5 ml LV vol s, MOD A2C: 44.5 ml LV vol s, MOD A4C: 49.4 ml LV SV MOD A2C:     37.0 ml LV SV MOD A4C:     96.5 ml LV SV MOD BP:      43.1 ml RIGHT VENTRICLE RV Basal diam:  2.81 cm LEFT ATRIUM             Index        RIGHT ATRIUM           Index LA diam:        3.20 cm 1.66 cm/m   RA Area:     11.00 cm LA Vol (A2C):   29.6 ml 15.37 ml/m  RA Volume:   19.30 ml  10.02 ml/m LA Vol (A4C):   36.3 ml 18.85 ml/m LA Biplane Vol: 33.6 ml 17.45 ml/m  AORTIC VALVE AV Area (Vmax):    1.79 cm AV Area (Vmean):   1.75 cm AV Area (VTI):     1.91 cm AV Vmax:           144.00 cm/s AV Vmean:          106.000 cm/s AV VTI:            0.291 m AV Peak Grad:      8.3 mmHg AV Mean Grad:      5.0 mmHg LVOT Vmax:         82.20 cm/s LVOT Vmean:        59.200 cm/s LVOT VTI:          0.177 m LVOT/AV VTI ratio: 0.61  AORTA Ao Root diam:  3.00 cm MITRAL VALVE MV Area (PHT): 4.54 cm     SHUNTS MV Decel Time: 167 msec     Systemic VTI:  0.18 m MV E velocity: 99.40 cm/s   Systemic Diam: 2.00 cm MV A velocity: 130.00 cm/s MV E/A ratio:  0.76 Dwayne D Callwood MD Electronically signed by Yolonda Kida MD Signature Date/Time: 10/11/2021/3:58:07 PM    Final     Assessment: - Exam reveals no focal deficit except for slight facial asymmetry which is significantly improved since prior exam during her admission earlier this week.  - CSF from LP performed in the ED yesterday: - Clear and colorless - Normal protein and WBC.  - Glucose mildly elevated which is an incidental finding likely due to high serum glucose of 151 at the time of LP.  and plan per attending  neurologist - CSF bacterial and fungal cultures in process - CSF viral panel is negative - Cryptococcal antigen negative - Of note, the patient states that headaches began after being started on a new Chron's disease medication, which may be the Upadacitinib (Rinvoq) listed as one of her home medications. Of note, Rinvoq, a Janus kinase inhibitor, has common side effects of upper respiratory tract infections such as common cold and sinus infections, nausea, cough, and fever. Headache, body aches/pain and ear congestion may also occur.   Recommendations: - Consider discontinuation of her newly started Rinvoq if cleared by her prescribing MD Myles Rosenthal, MD), and observe for possible improvement of her headache.  - Outpatient Neurology follow up for her headaches.   Electronically signed: Dr. Kerney Elbe 10/13/2021, 8:36 AM

## 2021-10-13 NOTE — Progress Notes (Signed)
Received MD order to discourage patient to home, reviewed discharge instructions, home meds,  prescriptions and follow up appointments with patient and patient verbalized understanding

## 2021-10-13 NOTE — Discharge Summary (Signed)
Physician Discharge Summary   Patient: Joanna Ortiz MRN: [id number] DOB: 16-Apr-1945  Admit date:     10/12/2021  Discharge date: 10/13/2021  Discharge Physician: Ezekiel Slocumb   PCP: Sharyne Peach, MD   Recommendations at discharge:    Follow up with Primary Care in 1-2 weeks Follow up with Gastroenterology Follow up regarding any further headaches Recommend considering change in therapy from Rinvoq given onset of severe headaches corresponding to initiation of Rinvoq  Discharge Diagnoses: Principal Problem:   Intractable headache  Resolved Problems:   * No resolved hospital problems. *  Hospital Course: HPI on admission: " Joanna Ortiz is a 76 y.o. female with medical history significant for anemia, ankylosing spondylitis, bilateral carotid artery stenosis, collagen vascular disease, Crohn disease, CAD, DM, GERD, HLD, hypothyroidism, rheumatoid arthritis who has recent interim history of discharged 8/23 with diagnosis of TIA, after patient presented on 8/22 with weakness, left temporal HA and word finding difficulties. Patient at that time had tia/cva evaluation noting negative CT head as well as negative MRI. Patient was also seen by neurology who recommended continuing asa /statin.  Patient per chart was noted to have relief of her HA with tylenol and was discharged with tylenol for symptomatic management. Patient now returns one day later with persistent HA with associated n/v. Patient was treated with migraine cocktail in ED with some relief, however HA was still persistent. Patient was discussed with neurology who recommended LP and admission for neurology follow up in am. Of note patient neuro exam remains non-focal. Patient currently states that her HA is made worse with movement, but improved s/p LP as well as medication given in ED. She also note starting new medication for hx of Chron's disease around same time severe HA began.   ED Course:  Afeb, hr 76, BP 174/94  rr16 sat 97 Labs glucose 108 Wbc 9, hgb 12.8,  plt 178  UA: neg EKG: nsr LAFB OVZ:CHYIFOY pending Crytptoccal ag neg Ct head: naD"    Assessment and Plan:  Intractable temporal HA -- resolved -possible migraine syndrome  - continue with prn migraine cocktail  -neurology consulted - see Dr Ileene Rubens recommendations and assessment in consult notes. -neuro checks were unremarkable -patient's headache resolved with migraine cocktails and fioricet -close outpatient follow up with neurology and GI recommended regarding change in biologic therapy if symptoms felt due to new medication     TIA -recent admit -continue asa/statin  -monitor on tele   Anemia -h/h stable      Ankylosing spondylitis RA  -resume home regimen    Crohn disease -continue home treatment  -no current flare       CAD -no active complaints       DM -iss/fs     GERD -ppi     HLD -resume statin     Hypothyroidism -continue synthroid       Consultants: Neurology Procedures performed: Lumbar puncture Disposition: Home Diet recommendation:  Regular diet DISCHARGE MEDICATION: Allergies as of 10/13/2021       Reactions   Erythromycin Hives   Hydromorphone Hives   Cefuroxime Nausea And Vomiting   Ciprocinonide [fluocinolone]    Muscle pain   Codeine    Other reaction(s): Vomiting   Floxin [ofloxacin] Nausea And Vomiting   Keflex [cephalexin] Nausea And Vomiting   Levofloxacin Nausea And Vomiting   Sulfa Antibiotics Nausea And Vomiting   Ciprofloxacin Nausea And Vomiting        Medication List  TAKE these medications    acetaminophen 500 MG tablet Commonly known as: TYLENOL Take 1,000 mg by mouth every 8 (eight) hours as needed for mild pain or moderate pain.   aspirin EC 81 MG tablet Take 1 tablet (81 mg total) by mouth daily. Swallow whole.   atorvastatin 40 MG tablet Commonly known as: LIPITOR Take 1 tablet (40 mg total) by mouth daily.   budesonide 3 MG 24 hr  capsule Commonly known as: ENTOCORT EC Take 9 mg by mouth daily.   butalbital-acetaminophen-caffeine 50-325-40 MG tablet Commonly known as: FIORICET Take 1 tablet by mouth every 6 (six) hours as needed for headache.   cholestyramine light 4 g packet Commonly known as: PREVALITE Take 1 packet by mouth daily.   CINNAMON PO Take 1,200 mg by mouth daily.   cyanocobalamin 1000 MCG/ML injection Commonly known as: VITAMIN B12 Inject 1,000 mcg into the muscle every 30 (thirty) days.   Insulin Lispro w/ Trans Port 100 UNIT/ML Sopn SSI  2 units 150-200 4 units-201-250 6 units 251-300 8 units 301-350 10 units-351-400 >400 call MD   loratadine 10 MG tablet Commonly known as: CLARITIN Take 10 mg by mouth daily.   magnesium oxide 400 MG tablet Commonly known as: MAG-OX Take 400 mg by mouth daily.   ondansetron 4 MG disintegrating tablet Commonly known as: ZOFRAN-ODT Take by mouth.   Potassium Chloride 40 MEQ/15ML (20%) Soln Take 15 ml by mouth once a week.   Rinvoq 15 MG Tb24 Generic drug: Upadacitinib ER Take 15 mg by mouth daily.   Synthroid 100 MCG tablet Generic drug: levothyroxine Take 100 mcg by mouth daily before breakfast. Take 6 days per week.   Theratears 0.25 % Soln Generic drug: Carboxymethylcellulose Sodium Place 1 drop into both eyes 3 (three) times daily as needed (dry/irritated eyes.).   thyroid 30 MG tablet Commonly known as: ARMOUR Take 30 mg by mouth daily before breakfast.   Turmeric 500 MG Caps Take 500 mg by mouth daily.   vitamin C 1000 MG tablet Take 1,000 mg by mouth 3 (three) times a week.        Discharge Exam: There were no vitals filed for this visit. General exam: awake, alert, no acute distress HEENT: moist mucus membranes, hearing grossly normal  Respiratory system: CTAB, no wheezes, rales or rhonchi, normal respiratory effort. Cardiovascular system: normal S1/S2, RRR   Gastrointestinal system: soft, NT Central nervous system:  A&O x4. no gross focal neurologic deficits, normal speech Extremities: moves all, no edema, normal tone Skin: dry, intact, normal temperature Psychiatry: normal mood, congruent affect, judgement and insight appear normal   Condition at discharge: stable  The results of significant diagnostics from this hospitalization (including imaging, microbiology, ancillary and laboratory) are listed below for reference.   Imaging Studies: DG FL GUIDED LUMBAR PUNCTURE  Result Date: 10/12/2021 CLINICAL DATA:  Patient with persistent headache request received for diagnostic lumbar puncture. EXAM: DIAGNOSTIC LUMBAR PUNCTURE UNDER FLUOROSCOPIC GUIDANCE COMPARISON:  MRI brain performed October 10, 2021 reviewed prior to the procedure. FLUOROSCOPY TIME:  Radiation Exposure Index (if provided by the fluoroscopic device): 3.00 mGy PROCEDURE: Informed consent was obtained from the patient prior to the procedure, including potential complications of bleeding, infection, paresthesias, nerve damage, CSF leak requiring additional procedures, post procedure requirement to lay flat for several hours after the procedure, headache, allergy, and pain. With the patient prone, the lower back was prepped with Betadine. 1% Lidocaine was used for local anesthesia. Lumbar puncture was performed at the  L3-L4 level using a 20 gauge needle with return of colorless CSF with an opening pressure of 19 cm water. 8 ml of CSF were obtained for laboratory studies. The inner stylet was placed back into the needle and the needle was removed in its entirety. The patient tolerated the procedure well and there were no apparent complications. A sterile bandage was applied. IMPRESSION: Technically successful fluoroscopic guided lumbar puncture. This exam was performed by Tsosie Billing PA-C, and was supervised and interpreted by Dr. Posey Pronto. Electronically Signed   By: Macy Mis M.D.   On: 10/12/2021 16:11   CT HEAD WO CONTRAST  Result Date:  10/12/2021 CLINICAL DATA:  Headaches, dizziness EXAM: CT HEAD WITHOUT CONTRAST TECHNIQUE: Contiguous axial images were obtained from the base of the skull through the vertex without intravenous contrast. RADIATION DOSE REDUCTION: This exam was performed according to the departmental dose-optimization program which includes automated exposure control, adjustment of the mA and/or kV according to patient size and/or use of iterative reconstruction technique. COMPARISON:  10/10/2021 FINDINGS: Brain: No acute intracranial findings are seen. There are no signs of bleeding within the cranium. Cortical sulci are prominent. Vascular: Unremarkable. Skull: Unremarkable. Sinuses/Orbits: Unremarkable. Other: No significant interval changes are noted. IMPRESSION: No acute intracranial findings are seen. Atrophy. No significant interval changes are noted. Electronically Signed   By: Elmer Picker M.D.   On: 10/12/2021 12:16   ECHOCARDIOGRAM COMPLETE  Result Date: 10/11/2021    ECHOCARDIOGRAM REPORT   Patient Name:   ALOISE COPUS Date of Exam: 10/11/2021 Medical Rec #:  [id number]        Height:       68.0 in Accession #:    [id number]       Weight:       173.9 lb Date of Birth:  22-Dec-1945       BSA:          1.926 m Patient Age:    39 years         BP:           153/77 mmHg Patient Gender: F                HR:           81 bpm. Exam Location:  [facility] Procedure: 2D Echo, Color Doppler and Cardiac Doppler Indications:     G45.9 TIA  History:         Patient has no prior history of Echocardiogram examinations.                  CAD, Arrythmias:LBBB; Risk Factors:Dyslipidemia and Diabetes.  Sonographer:     Charmayne Sheer Referring Phys:  [place] Diagnosing Phys: Yolonda Kida MD  Sonographer Comments: No subcostal window. IMPRESSIONS  1. Left ventricular ejection fraction, by estimation, is 50 to 55%. The left ventricle has low normal function. The left ventricle demonstrates global hypokinesis. Left  ventricular diastolic parameters are consistent with Grade I diastolic dysfunction (impaired relaxation).  2. Right ventricular systolic function is normal. The right ventricular size is normal.  3. The mitral valve is normal in structure. No evidence of mitral valve regurgitation.  4. The aortic valve is grossly normal. Aortic valve regurgitation is not visualized. FINDINGS  Left Ventricle: Left ventricular ejection fraction, by estimation, is 50 to 55%. The left ventricle has low normal function. The left ventricle demonstrates global hypokinesis. The left ventricular internal cavity size was normal in size. There is no left ventricular  hypertrophy. Left ventricular diastolic parameters are consistent with Grade I diastolic dysfunction (impaired relaxation). Right Ventricle: The right ventricular size is normal. No increase in right ventricular wall thickness. Right ventricular systolic function is normal. Left Atrium: Left atrial size was normal in size. Right Atrium: Right atrial size was normal in size. Pericardium: There is no evidence of pericardial effusion. Mitral Valve: The mitral valve is normal in structure. There is mild thickening of the mitral valve leaflet(s). Mild mitral annular calcification. No evidence of mitral valve regurgitation. Tricuspid Valve: The tricuspid valve is normal in structure. Tricuspid valve regurgitation is trivial. Aortic Valve: The aortic valve is grossly normal. Aortic valve regurgitation is not visualized. Aortic valve mean gradient measures 5.0 mmHg. Aortic valve peak gradient measures 8.3 mmHg. Aortic valve area, by VTI measures 1.91 cm. Pulmonic Valve: The pulmonic valve was normal in structure. Pulmonic valve regurgitation is not visualized. Aorta: The ascending aorta was not well visualized. IAS/Shunts: No atrial level shunt detected by color flow Doppler. Additional Comments: There is no pleural effusion.  LEFT VENTRICLE PLAX 2D LVIDd:         4.80 cm     Diastology  LVIDs:         3.65 cm     LV e' medial:    4.24 cm/s LV PW:         1.20 cm     LV E/e' medial:  23.4 LV IVS:        0.73 cm     LV e' lateral:   4.90 cm/s LVOT diam:     2.00 cm     LV E/e' lateral: 20.3 LV SV:         56 LV SV Index:   29 LVOT Area:     3.14 cm  LV Volumes (MOD) LV vol d, MOD A2C: 81.5 ml LV vol d, MOD A4C: 96.5 ml LV vol s, MOD A2C: 44.5 ml LV vol s, MOD A4C: 49.4 ml LV SV MOD A2C:     37.0 ml LV SV MOD A4C:     96.5 ml LV SV MOD BP:      43.1 ml RIGHT VENTRICLE RV Basal diam:  2.81 cm LEFT ATRIUM             Index        RIGHT ATRIUM           Index LA diam:        3.20 cm 1.66 cm/m   RA Area:     11.00 cm LA Vol (A2C):   29.6 ml 15.37 ml/m  RA Volume:   19.30 ml  10.02 ml/m LA Vol (A4C):   36.3 ml 18.85 ml/m LA Biplane Vol: 33.6 ml 17.45 ml/m  AORTIC VALVE AV Area (Vmax):    1.79 cm AV Area (Vmean):   1.75 cm AV Area (VTI):     1.91 cm AV Vmax:           144.00 cm/s AV Vmean:          106.000 cm/s AV VTI:            0.291 m AV Peak Grad:      8.3 mmHg AV Mean Grad:      5.0 mmHg LVOT Vmax:         82.20 cm/s LVOT Vmean:        59.200 cm/s LVOT VTI:          0.177 m LVOT/AV VTI ratio:  0.34  AORTA Ao Root diam: 3.00 cm MITRAL VALVE MV Area (PHT): 4.54 cm     SHUNTS MV Decel Time: 167 msec     Systemic VTI:  0.18 m MV E velocity: 99.40 cm/s   Systemic Diam: 2.00 cm MV A velocity: 130.00 cm/s MV E/A ratio:  0.76 Dwayne D Callwood MD Electronically signed by Yolonda Kida MD Signature Date/Time: 10/11/2021/3:58:07 PM    Final    MR BRAIN WO CONTRAST  Result Date: 10/10/2021 CLINICAL DATA:  Altered mental status EXAM: MRI HEAD WITHOUT CONTRAST TECHNIQUE: Multiplanar, multiecho pulse sequences of the brain and surrounding structures were obtained without intravenous contrast. COMPARISON:  08/19/2021 brain MRI FINDINGS: Brain: No acute infarct, mass effect or extra-axial collection. No acute or chronic hemorrhage. There is multifocal hyperintense T2-weighted signal within the white  matter. Parenchymal volume and CSF spaces are normal. The midline structures are normal. Vascular: Major flow voids are preserved. Skull and upper cervical spine: Normal calvarium and skull base. Visualized upper cervical spine and soft tissues are normal. Sinuses/Orbits:No paranasal sinus fluid levels or advanced mucosal thickening. No mastoid or middle ear effusion. Normal orbits. IMPRESSION: 1. No acute intracranial abnormality. 2. Findings of chronic microvascular ischemia. Electronically Signed   By: Ulyses Jarred M.D.   On: 10/10/2021 23:41   CT ANGIO HEAD NECK W WO CM (CODE STROKE)  Result Date: 10/10/2021 CLINICAL DATA:  Neuro deficit, acute, stroke suspected. Altered mental status, left-sided headache, slurred speech, and dizziness. EXAM: CT ANGIOGRAPHY HEAD AND NECK TECHNIQUE: Multidetector CT imaging of the head and neck was performed using the standard protocol during bolus administration of intravenous contrast. Multiplanar CT image reconstructions and MIPs were obtained to evaluate the vascular anatomy. Carotid stenosis measurements (when applicable) are obtained utilizing NASCET criteria, using the distal internal carotid diameter as the denominator. RADIATION DOSE REDUCTION: This exam was performed according to the departmental dose-optimization program which includes automated exposure control, adjustment of the mA and/or kV according to patient size and/or use of iterative reconstruction technique. CONTRAST:  50m OMNIPAQUE IOHEXOL 350 MG/ML SOLN COMPARISON:  Head and neck CTA 02/01/2021 FINDINGS: CTA NECK FINDINGS Aortic arch: Standard 3 vessel aortic arch with mild atherosclerotic plaque. Widely patent arch vessel origins. Right carotid system: Patent with a small amount of calcified plaque at the carotid bifurcation. No evidence of a significant stenosis or dissection. Retropharyngeal course of the proximal ICA. Left carotid system: Patent with a small amount of calcified plaque at the  carotid bifurcation. No evidence of a significant stenosis or dissection. Vertebral arteries: Patent without evidence of a significant stenosis or dissection. Moderately dominant right vertebral artery. Skeleton: Moderate cervical disc degeneration. Advanced upper cervical facet arthrosis. Other neck: No evidence of cervical lymphadenopathy or mass. Upper chest: No apical lung consolidation or mass. Review of the MIP images confirms the above findings CTA HEAD FINDINGS Anterior circulation: The internal carotid arteries are patent from skull base to carotid termini with mild atherosclerotic plaque bilaterally not resulting in significant stenosis. ACAs and MCAs are patent without evidence of a proximal branch occlusion or significant proximal stenosis. The right A1 segment is hypoplastic. No aneurysm is identified. Posterior circulation: The intracranial vertebral arteries are widely patent to the basilar. Patent bilateral PICA, right AICA, and bilateral SCA origins are visualized. The basilar artery is widely patent. There is a patent right posterior communicating artery. Both PCAs are patent without evidence of a significant proximal stenosis. No aneurysm is identified. Venous sinuses: Patent. Anatomic variants: None of  significance. Review of the MIP images confirms the above findings IMPRESSION: 1. Mild atherosclerosis in the head and neck without large vessel occlusion or significant stenosis. 2. Aortic Atherosclerosis (ICD10-I70.0). Electronically Signed   By: Logan Bores M.D.   On: 10/10/2021 20:05   CT HEAD CODE STROKE WO CONTRAST  Result Date: 10/10/2021 CLINICAL DATA:  Code stroke.  Acute neurologic deficit EXAM: CT HEAD WITHOUT CONTRAST TECHNIQUE: Contiguous axial images were obtained from the base of the skull through the vertex without intravenous contrast. RADIATION DOSE REDUCTION: This exam was performed according to the departmental dose-optimization program which includes automated exposure  control, adjustment of the mA and/or kV according to patient size and/or use of iterative reconstruction technique. COMPARISON:  08/18/2021 FINDINGS: Brain: There is no mass, hemorrhage or extra-axial collection. The size and configuration of the ventricles and extra-axial CSF spaces are normal. The brain parenchyma is normal, without evidence of acute or chronic infarction. Vascular: No abnormal hyperdensity of the major intracranial arteries or dural venous sinuses. No intracranial atherosclerosis. Skull: The visualized skull base, calvarium and extracranial soft tissues are normal. Sinuses/Orbits: No fluid levels or advanced mucosal thickening of the visualized paranasal sinuses. No mastoid or middle ear effusion. The orbits are normal. ASPECTS [facility] Stroke Program Early CT Score) - Ganglionic level infarction (caudate, lentiform nuclei, internal capsule, insula, M1-M3 cortex): 7 - Supraganglionic infarction (M4-M6 cortex): 3 Total score (0-10 with 10 being normal): 10 IMPRESSION: 1. No acute intracranial abnormality. 2. ASPECTS is 10. These results were called by telephone at the time of interpretation on 10/10/2021 at 7:24 pm to provider Duffy Bruce , who verbally acknowledged these results. Electronically Signed   By: Ulyses Jarred M.D.   On: 10/10/2021 19:25    Microbiology: Results for orders placed or performed during the hospital encounter of 10/12/21  CSF culture w Gram Stain     Status: None (Preliminary result)   Collection Time: 10/12/21  2:28 PM   Specimen: CSF; Cerebrospinal Fluid  Result Value Ref Range Status   Specimen Description   Final    CSF Performed at [facility], [address] [place], [place] [address]    Special Requests   Final    NONE Performed at [facility], [address] [place], [place] [address]    Gram Stain   Final    WBC SEEN RED BLOOD CELLS NO ORGANISMS SEEN Performed at [facility], [address]  [place], [place] [address]    Culture   Final    NO GROWTH < 24 HOURS Performed at [facility], [place] [address], [place], [place] [address]    Report Status PENDING  Incomplete    Labs: CBC: Recent Labs  Lab 10/10/21 1922 10/11/21 0608 10/12/21 1147 10/12/21 2300  WBC 8.1 10.0 9.0 9.8  NEUTROABS 3.2  --   --  6.5  HGB 11.8* 11.6* 12.8 11.8*  HCT 36.4 36.4 39.9 36.0  MCV 89.4 88.6 88.3 87.2  PLT 180 168 178 676   Basic Metabolic Panel: Recent Labs  Lab 10/10/21 1922 10/11/21 0608 10/12/21 1147 10/12/21 2300  NA 138 141 139 137  K 3.7 3.4* 3.6 3.2*  CL 103 107 104 105  CO2 '27 28 24 24  '$ GLUCOSE 138* 145* 183* 151*  BUN '21 18 15 15  '$ CREATININE 1.01* 0.94 0.82 0.71  CALCIUM 8.1* 7.8* 8.4* 8.2*  MG  --   --   --  2.6*  PHOS  --   --   --  4.5   Liver Function Tests: Recent Labs  Lab 10/10/21 1922 10/11/21 0608 10/12/21 2300  AST 32 30 29  ALT 47* 43 39  ALKPHOS 105 103 93  BILITOT 0.6 0.4 1.0  PROT 6.4* 6.4* 6.2*  ALBUMIN 3.4* 3.3* 3.2*   CBG: Recent Labs  Lab 10/11/21 0017 10/11/21 0742 10/11/21 1207 10/11/21 1537 10/13/21 0755  GLUCAP 135* 136* 122* 108* 142*    Discharge time spent: greater than 30 minutes.  Signed: Ezekiel Slocumb, DO [facility] 10/13/2021

## 2021-10-15 LAB — CSF CULTURE W GRAM STAIN: Culture: NO GROWTH

## 2021-10-20 LAB — CSF IGG: IgG, CSF: 2.8 mg/dL (ref 0.0–6.7)

## 2021-10-20 LAB — HSV 1/2 PCR, CSF
HSV-1 DNA: NEGATIVE
HSV-2 DNA: NEGATIVE

## 2021-10-20 LAB — VDRL, CSF: VDRL Quant, CSF: NONREACTIVE

## 2021-10-20 LAB — VZV PCR, CSF: VZV PCR, CSF: NEGATIVE

## 2021-11-03 LAB — CULTURE, FUNGUS WITHOUT SMEAR

## 2021-11-21 DIAGNOSIS — J302 Other seasonal allergic rhinitis: Secondary | ICD-10-CM | POA: Insufficient documentation

## 2021-11-21 DIAGNOSIS — I700 Atherosclerosis of aorta: Secondary | ICD-10-CM | POA: Insufficient documentation

## 2021-11-23 ENCOUNTER — Other Ambulatory Visit: Payer: Self-pay | Admitting: Family Medicine

## 2021-11-23 DIAGNOSIS — Z1231 Encounter for screening mammogram for malignant neoplasm of breast: Secondary | ICD-10-CM

## 2022-03-23 ENCOUNTER — Ambulatory Visit: Payer: Self-pay | Admitting: Gastroenterology

## 2022-03-23 ENCOUNTER — Encounter: Payer: Self-pay | Admitting: Gastroenterology

## 2022-03-23 ENCOUNTER — Other Ambulatory Visit: Payer: Self-pay | Admitting: Gastroenterology

## 2022-03-23 DIAGNOSIS — E612 Magnesium deficiency: Secondary | ICD-10-CM

## 2022-03-23 MED ORDER — MAGNESIUM SULFATE 4 GM/100ML IV SOLN
4.0000 g | Freq: Once | INTRAVENOUS | Status: AC
  Filled 2022-03-23: qty 100

## 2022-03-23 NOTE — Progress Notes (Signed)
Ordered IV magnesium for patient. Same day surgery to contact patient on Monday.  Raylene Miyamoto MD, MPH

## 2022-03-26 ENCOUNTER — Ambulatory Visit
Admission: RE | Admit: 2022-03-26 | Discharge: 2022-03-26 | Disposition: A | Discharge status: Home / Self Care | Payer: Federal, State, Local not specified - PPO | Source: Ambulatory Visit | Attending: Gastroenterology | Admitting: Gastroenterology

## 2022-03-26 DIAGNOSIS — E612 Magnesium deficiency: Secondary | ICD-10-CM | POA: Diagnosis not present

## 2022-03-26 MED ORDER — MAGNESIUM SULFATE 4 GM/100ML IV SOLN
4.0000 g | Freq: Once | INTRAVENOUS | Status: AC
  Administered 2022-03-26: 4 g via INTRAVENOUS
  Filled 2022-03-26: qty 100

## 2022-04-05 ENCOUNTER — Other Ambulatory Visit: Payer: Self-pay | Admitting: Gastroenterology

## 2022-04-05 DIAGNOSIS — E612 Magnesium deficiency: Secondary | ICD-10-CM

## 2022-04-06 ENCOUNTER — Ambulatory Visit
Admission: RE | Admit: 2022-04-06 | Discharge: 2022-04-06 | Disposition: A | Discharge status: Home / Self Care | Payer: Federal, State, Local not specified - PPO | Source: Ambulatory Visit | Attending: Gastroenterology | Admitting: Gastroenterology

## 2022-04-06 DIAGNOSIS — E612 Magnesium deficiency: Secondary | ICD-10-CM | POA: Diagnosis not present

## 2022-04-06 MED ORDER — MAGNESIUM SULFATE 4 GM/100ML IV SOLN
4.0000 g | Freq: Once | INTRAVENOUS | Status: AC
  Administered 2022-04-06: 4 g via INTRAVENOUS
  Filled 2022-04-06: qty 100

## 2022-04-06 NOTE — Progress Notes (Signed)
Patient contacted to monitoring while receiving infusion per hospital policy.

## 2022-04-15 ENCOUNTER — Other Ambulatory Visit: Payer: Self-pay

## 2022-04-15 ENCOUNTER — Emergency Department: Payer: Federal, State, Local not specified - PPO

## 2022-04-15 ENCOUNTER — Emergency Department
Admission: EM | Admit: 2022-04-15 | Discharge: 2022-04-15 | Disposition: A | Discharge status: Home / Self Care | Payer: Federal, State, Local not specified - PPO | Attending: Emergency Medicine | Admitting: Emergency Medicine

## 2022-04-15 DIAGNOSIS — R531 Weakness: Secondary | ICD-10-CM | POA: Diagnosis not present

## 2022-04-15 DIAGNOSIS — R197 Diarrhea, unspecified: Secondary | ICD-10-CM | POA: Diagnosis not present

## 2022-04-15 DIAGNOSIS — R799 Abnormal finding of blood chemistry, unspecified: Secondary | ICD-10-CM | POA: Diagnosis not present

## 2022-04-15 LAB — CBC WITH DIFFERENTIAL/PLATELET
Abs Immature Granulocytes: 0.06 10*3/uL (ref 0.00–0.07)
Basophils Absolute: 0 10*3/uL (ref 0.0–0.1)
Basophils Relative: 1 %
Eosinophils Absolute: 0.2 10*3/uL (ref 0.0–0.5)
Eosinophils Relative: 3 %
HCT: 39 % (ref 36.0–46.0)
Hemoglobin: 12.6 g/dL (ref 12.0–15.0)
Immature Granulocytes: 1 %
Lymphocytes Relative: 31 %
Lymphs Abs: 2.7 10*3/uL (ref 0.7–4.0)
MCH: 28.1 pg (ref 26.0–34.0)
MCHC: 32.3 g/dL (ref 30.0–36.0)
MCV: 87.1 fL (ref 80.0–100.0)
Monocytes Absolute: 0.6 10*3/uL (ref 0.1–1.0)
Monocytes Relative: 7 %
Neutro Abs: 5 10*3/uL (ref 1.7–7.7)
Neutrophils Relative %: 57 %
Platelets: 211 10*3/uL (ref 150–400)
RBC: 4.48 MIL/uL (ref 3.87–5.11)
RDW: 14.9 % (ref 11.5–15.5)
WBC: 8.6 10*3/uL (ref 4.0–10.5)
nRBC: 0 % (ref 0.0–0.2)

## 2022-04-15 LAB — MAGNESIUM
Magnesium: 1 mg/dL — ABNORMAL LOW (ref 1.7–2.4)
Magnesium: 2.7 mg/dL — ABNORMAL HIGH (ref 1.7–2.4)

## 2022-04-15 LAB — COMPREHENSIVE METABOLIC PANEL
ALT: 37 U/L (ref 0–44)
AST: 35 U/L (ref 15–41)
Albumin: 3.5 g/dL (ref 3.5–5.0)
Alkaline Phosphatase: 121 U/L (ref 38–126)
Anion gap: 10 (ref 5–15)
BUN: 16 mg/dL (ref 8–23)
CO2: 20 mmol/L — ABNORMAL LOW (ref 22–32)
Calcium: 8.4 mg/dL — ABNORMAL LOW (ref 8.9–10.3)
Chloride: 109 mmol/L (ref 98–111)
Creatinine, Ser: 0.86 mg/dL (ref 0.44–1.00)
GFR, Estimated: >60 mL/min (ref >60)
Glucose, Bld: 138 mg/dL — ABNORMAL HIGH (ref 70–99)
Potassium: 4 mmol/L (ref 3.5–5.1)
Sodium: 139 mmol/L (ref 135–145)
Total Bilirubin: 0.6 mg/dL (ref 0.3–1.2)
Total Protein: 6.9 g/dL (ref 6.5–8.1)

## 2022-04-15 LAB — TROPONIN I (HIGH SENSITIVITY)
Troponin I (High Sensitivity): 3 ng/L (ref <18)
Troponin I (High Sensitivity): 4 ng/L (ref <18)

## 2022-04-15 MED ORDER — MAGNESIUM SULFATE 4 GM/100ML IV SOLN
4.0000 g | Freq: Once | INTRAVENOUS | Status: AC
  Administered 2022-04-15: 4 g via INTRAVENOUS
  Filled 2022-04-15: qty 100

## 2022-04-15 NOTE — Discharge Instructions (Addendum)
We have treated your hypomagnesia him but it is important to talk to your primary care doctor about what steps they want to do now if they want you to continue the oral magnesium or start you on anything else to try to help with the low magnesium.  Return to the ER if you develop fevers, pain or any other concerns

## 2022-04-15 NOTE — ED Notes (Signed)
Pt A&O x4, no obvious distress noted, respirations regular/unlabored. Pt verbalizes understanding of discharge instructions. Pt able to ambulate from ED independently.

## 2022-04-15 NOTE — ED Triage Notes (Signed)
Crohn's patient is here today for hypomagnesemia (reported level of 1.0); She had labs drawn at her GI office who contacted her to let her know of the results; Patient's hypomagnesemia is related to her Crohn's

## 2022-04-15 NOTE — ED Provider Notes (Signed)
[facility] Provider Note    Event Date/Time   First MD Initiated Contact with Patient 04/15/22 1501     (approximate)   History   Abnormal Lab (Crohn's patient is here today for hypomagnesemia (reported level of 1.0); She had labs drawn at her GI office who contacted her to let her know of the results; Patient's hypomagnesemia is related to her Crohn's)   HPI  Joanna Ortiz is a 77 y.o. female with Crohn's disease with known hypomagnesia who comes in with concerns for recurrent hypomagnesia today.  On review of records patient has been seen before at [facility] on 03/14/2422 given 4 g of magnesium and discharged home with magnesium correction.  She comes in today after outpatient labs drawn her GI office showed a low magnesium.  Patient denies any palpitations, tetany, seizures.  She reports having magnesium is as low as 0.7.  She states that this is actually not as bad as it has been in the past.  She states that she has had some diarrhea and just overall some mild weakness but she states that she typically can come in and get some IV magnesium.  They are working on what to do outpatient for her given the slow mag does cause some diarrhea which could be worsening the hypomagnesia him.  She is try to increase her diet.  She has a doctor who is also thinking about starting her on a blood pressure medicine that could help keep the magnesium elevated as well.  She states that they are going to call her tomorrow to talk about these different options.  That she is here today for some IV magnesium.  She does report having a minimal nosebleed earlier today but does report that that has since all resolved  Reports ongoing problems with low magnesium. Has tried magnesium oxide in the past, and recently Slow-Mag. Slow-Mag was increased to 2 doses twice per day last week, and her repeat magnesium level was 0.8 03/13/22.    Physical Exam   Triage Vital Signs: ED Triage Vitals  Enc  Vitals Group     BP 04/15/22 1315 (!) 157/75     Pulse Rate 04/15/22 1315 77     Resp 04/15/22 1315 19     Temp 04/15/22 1315 (!) 97.5 F (36.4 C)     Temp Source 04/15/22 1315 Oral     SpO2 04/15/22 1315 99 %     Weight 04/15/22 1314 169 lb 15.6 oz (77.1 kg)     Height 04/15/22 1314 '5\' 8"'$  (1.727 m)     Head Circumference --      Peak Flow --      Pain Score 04/15/22 1311 0     Pain Loc --      Pain Edu? --      Excl. in [place]? --     Most recent vital signs: Vitals:   04/15/22 1315  BP: (!) 157/75  Pulse: 77  Resp: 19  Temp: (!) 97.5 F (36.4 C)  SpO2: 99%     General: Awake, no distress.  CV:  Good peripheral perfusion.  Resp:  Normal effort.  Abd:  No distention.  Soft nontender Other:  No active nosebleed   ED Results / Procedures / Treatments   Labs (all labs ordered are listed, but only abnormal results are displayed) Labs Reviewed  COMPREHENSIVE METABOLIC PANEL - Abnormal; Notable for the following components:      Result Value   CO2  20 (*)    Glucose, Bld 138 (*)    Calcium 8.4 (*)    All other components within normal limits  MAGNESIUM - Abnormal; Notable for the following components:   Magnesium 1.0 (*)    All other components within normal limits  CBC WITH DIFFERENTIAL/PLATELET  TROPONIN I (HIGH SENSITIVITY)  TROPONIN I (HIGH SENSITIVITY)     EKG  My interpretation of EKG:  Normal sinus rate of 73 without any ST elevation or T wave inversions, left anterior fascicular block, similar to prior EKG in August 2023    PROCEDURES:  Critical Care performed: No  .1-3 Lead EKG Interpretation  Performed by: Vanessa [place], MD Authorized by: Vanessa [place], MD     Interpretation: normal     ECG rate:  60   ECG rate assessment: normal     Rhythm: sinus rhythm     Ectopy: none     Conduction: normal      MEDICATIONS ORDERED IN ED: Medications  magnesium sulfate IVPB 4 g 100 mL (has no administration in time range)     IMPRESSION / MDM /  ASSESSMENT AND PLAN / ED COURSE  I reviewed the triage vital signs and the nursing notes.   Patient's presentation is most consistent with acute presentation with potential threat to life or bodily function.   Patient comes in with hypomagnesia.  No evidence of severe issues such as tetany, arrhythmias or seizures.  We discussed admission versus discharge.  Given patient's overall feeling well has had this happen previously she feels comfortable with discharge home.  Will give 4 g of IV magnesium and recheck and suspect patient will be discharged home.  She will follow-up with her doctor tomorrow we discussed oral supplementation at home and she is going to talk to her doctor further about this tomorrow.  Troponin is negative.  CBC reassuring.  CMP reassuring.  Magnesium is 1.0. Calcium 8.4   Repeat magnesium is increased to 2.7.  On reevaluation patient is tolerating eating.  Denies any concerns abdomen soft nontender she is ready for discharge home and will talk to her doctor tomorrow about continued outpatient treatment for her low magnesium    The patient is on the cardiac monitor to evaluate for evidence of arrhythmia and/or significant heart rate changes.      FINAL CLINICAL IMPRESSION(S) / ED DIAGNOSES   Final diagnoses:  Hypomagnesemia     Rx / DC Orders   ED Discharge Orders     None        Note:  This document was prepared using Dragon voice recognition software and may include unintentional dictation errors.   Vanessa [place], MD 04/15/22 [phone]

## 2022-04-26 MED ORDER — MAGNESIUM SULFATE 4 GM/100ML IV SOLN
4.0000 g | Freq: Once | INTRAVENOUS | Status: AC
  Administered 2022-04-27: 4 g via INTRAVENOUS
  Filled 2022-04-26: qty 100

## 2022-04-27 ENCOUNTER — Ambulatory Visit
Admission: RE | Admit: 2022-04-27 | Discharge: 2022-04-27 | Disposition: A | Discharge status: Home / Self Care | Payer: Federal, State, Local not specified - PPO | Source: Ambulatory Visit | Attending: Gastroenterology | Admitting: Gastroenterology

## 2022-05-04 ENCOUNTER — Ambulatory Visit
Admission: RE | Admit: 2022-05-04 | Discharge: 2022-05-04 | Disposition: A | Discharge status: Home / Self Care | Payer: Federal, State, Local not specified - PPO | Source: Ambulatory Visit | Attending: Gastroenterology | Admitting: Gastroenterology

## 2022-05-04 DIAGNOSIS — E612 Magnesium deficiency: Secondary | ICD-10-CM | POA: Diagnosis not present

## 2022-05-04 MED ORDER — MAGNESIUM SULFATE 4 GM/100ML IV SOLN
4.0000 g | Freq: Once | INTRAVENOUS | Status: AC
  Administered 2022-05-04: 4 g via INTRAVENOUS
  Filled 2022-05-04 (×2): qty 100

## 2022-05-11 ENCOUNTER — Ambulatory Visit
Admission: RE | Admit: 2022-05-11 | Discharge: 2022-05-11 | Disposition: A | Discharge status: Home / Self Care | Payer: Federal, State, Local not specified - PPO | Source: Ambulatory Visit | Attending: Gastroenterology | Admitting: Gastroenterology

## 2022-05-11 ENCOUNTER — Inpatient Hospital Stay: Admission: RE | Admit: 2022-05-11 | Payer: Federal, State, Local not specified - PPO | Source: Ambulatory Visit

## 2022-05-11 MED ORDER — MAGNESIUM SULFATE 4 GM/100ML IV SOLN
4.0000 g | Freq: Once | INTRAVENOUS | Status: AC
  Administered 2022-05-11: 4 g via INTRAVENOUS
  Filled 2022-05-11: qty 100

## 2022-05-11 NOTE — Progress Notes (Signed)
Pt magnesium infusion completed. Per Dr. Drinda Butts request. Pt IV was left in place and taken to [facility] clinic where she would receive a separate infusion.

## 2022-06-13 ENCOUNTER — Other Ambulatory Visit: Payer: Self-pay | Admitting: Gastroenterology

## 2022-06-13 DIAGNOSIS — R935 Abnormal findings on diagnostic imaging of other abdominal regions, including retroperitoneum: Secondary | ICD-10-CM

## 2022-06-14 ENCOUNTER — Ambulatory Visit
Admission: RE | Admit: 2022-06-14 | Discharge: 2022-06-14 | Disposition: A | Discharge status: Home / Self Care | Payer: Federal, State, Local not specified - PPO | Source: Ambulatory Visit | Attending: Gastroenterology | Admitting: Gastroenterology

## 2022-06-14 DIAGNOSIS — R935 Abnormal findings on diagnostic imaging of other abdominal regions, including retroperitoneum: Secondary | ICD-10-CM | POA: Insufficient documentation

## 2022-06-14 MED ORDER — IOHEXOL 300 MG/ML  SOLN
100.0000 mL | Freq: Once | INTRAMUSCULAR | Status: AC | PRN
  Administered 2022-06-14: 100 mL via INTRAVENOUS

## 2022-06-22 ENCOUNTER — Other Ambulatory Visit: Payer: Self-pay | Admitting: Gastroenterology

## 2022-06-22 DIAGNOSIS — K5080 Crohn's disease of both small and large intestine without complications: Secondary | ICD-10-CM

## 2022-07-03 ENCOUNTER — Ambulatory Visit: Admission: RE | Admit: 2022-07-03 | Payer: Federal, State, Local not specified - PPO | Source: Home / Self Care

## 2022-07-03 ENCOUNTER — Encounter: Admission: RE | Payer: Self-pay | Source: Home / Self Care

## 2022-07-03 SURGERY — COLONOSCOPY WITH PROPOFOL
Anesthesia: General

## 2022-07-18 ENCOUNTER — Ambulatory Visit
Admission: RE | Admit: 2022-07-18 | Discharge: 2022-07-18 | Disposition: A | Discharge status: Home / Self Care | Payer: Federal, State, Local not specified - PPO | Source: Ambulatory Visit | Attending: Gastroenterology | Admitting: Gastroenterology

## 2022-07-18 DIAGNOSIS — K5080 Crohn's disease of both small and large intestine without complications: Secondary | ICD-10-CM | POA: Insufficient documentation

## 2022-07-18 MED ORDER — IOHEXOL 300 MG/ML  SOLN
100.0000 mL | Freq: Once | INTRAMUSCULAR | Status: AC | PRN
  Administered 2022-07-18: 100 mL via INTRAVENOUS

## 2022-09-17 ENCOUNTER — Ambulatory Visit
Admission: RE | Admit: 2022-09-17 | Discharge: 2022-09-17 | Disposition: A | Discharge status: Home / Self Care | Payer: Federal, State, Local not specified - PPO | Source: Ambulatory Visit | Attending: Gastroenterology | Admitting: Gastroenterology

## 2022-09-17 MED ORDER — MAGNESIUM SULFATE 4 GM/100ML IV SOLN
4.0000 g | Freq: Once | INTRAVENOUS | Status: AC
  Administered 2022-09-17: 4 g via INTRAVENOUS
  Filled 2022-09-17: qty 100

## 2022-09-28 ENCOUNTER — Ambulatory Visit
Admission: RE | Admit: 2022-09-28 | Discharge: 2022-09-28 | Disposition: A | Discharge status: Home / Self Care | Payer: Federal, State, Local not specified - PPO | Source: Ambulatory Visit | Attending: Gastroenterology | Admitting: Gastroenterology

## 2022-09-28 MED ORDER — MAGNESIUM SULFATE 4 GM/100ML IV SOLN
4.0000 g | Freq: Once | INTRAVENOUS | Status: AC
  Administered 2022-09-28: 4 g via INTRAVENOUS
  Filled 2022-09-28: qty 100

## 2022-10-12 ENCOUNTER — Other Ambulatory Visit: Payer: Self-pay | Admitting: Gastroenterology

## 2022-10-12 DIAGNOSIS — K668 Other specified disorders of peritoneum: Secondary | ICD-10-CM

## 2023-01-16 ENCOUNTER — Other Ambulatory Visit: Payer: Self-pay | Admitting: Gastroenterology

## 2023-01-16 DIAGNOSIS — K5080 Crohn's disease of both small and large intestine without complications: Secondary | ICD-10-CM

## 2023-01-23 ENCOUNTER — Ambulatory Visit
Admission: RE | Admit: 2023-01-23 | Discharge: 2023-01-23 | Disposition: A | Discharge status: Home / Self Care | Payer: Federal, State, Local not specified - PPO | Source: Ambulatory Visit | Attending: Gastroenterology | Admitting: Gastroenterology

## 2023-01-23 DIAGNOSIS — K5080 Crohn's disease of both small and large intestine without complications: Secondary | ICD-10-CM | POA: Diagnosis present

## 2023-01-23 MED ORDER — IOHEXOL 9 MG/ML PO SOLN
500.0000 mL | ORAL | Status: AC
  Administered 2023-01-23: 500 mL via ORAL

## 2023-01-23 MED ORDER — IOHEXOL 300 MG/ML  SOLN
100.0000 mL | Freq: Once | INTRAMUSCULAR | Status: AC | PRN
  Administered 2023-01-23: 100 mL via INTRAVENOUS

## 2023-01-28 ENCOUNTER — Ambulatory Visit
Admission: RE | Admit: 2023-01-28 | Discharge: 2023-01-28 | Disposition: A | Discharge status: Home / Self Care | Payer: Federal, State, Local not specified - PPO | Source: Ambulatory Visit | Attending: Gastroenterology | Admitting: Gastroenterology

## 2023-01-28 DIAGNOSIS — M069 Rheumatoid arthritis, unspecified: Secondary | ICD-10-CM | POA: Insufficient documentation

## 2023-01-28 DIAGNOSIS — Z5181 Encounter for therapeutic drug level monitoring: Secondary | ICD-10-CM | POA: Diagnosis present

## 2023-01-28 MED ORDER — MAGNESIUM SULFATE 4 GM/100ML IV SOLN
4.0000 g | Freq: Once | INTRAVENOUS | Status: AC
  Administered 2023-01-28: 4 g via INTRAVENOUS
  Filled 2023-01-28: qty 100

## 2023-02-06 ENCOUNTER — Other Ambulatory Visit
Admission: RE | Admit: 2023-02-06 | Discharge: 2023-02-06 | Disposition: A | Discharge status: Home / Self Care | Payer: Federal, State, Local not specified - PPO | Attending: Gastroenterology | Admitting: Gastroenterology

## 2023-02-06 DIAGNOSIS — Z79899 Other long term (current) drug therapy: Secondary | ICD-10-CM | POA: Insufficient documentation

## 2023-02-06 DIAGNOSIS — K5000 Crohn's disease of small intestine without complications: Secondary | ICD-10-CM | POA: Diagnosis present

## 2023-02-06 LAB — MAGNESIUM: Magnesium: 1.6 mg/dL — ABNORMAL LOW (ref 1.7–2.4)

## 2023-02-08 LAB — MISC LABCORP TEST (SEND OUT)

## 2023-02-21 LAB — MISC LABCORP TEST (SEND OUT)

## 2023-03-27 DIAGNOSIS — M79646 Pain in unspecified finger(s): Secondary | ICD-10-CM | POA: Insufficient documentation

## 2023-03-30 DIAGNOSIS — M13841 Other specified arthritis, right hand: Secondary | ICD-10-CM | POA: Insufficient documentation

## 2023-04-02 ENCOUNTER — Ambulatory Visit
Admission: RE | Admit: 2023-04-02 | Discharge: 2023-04-02 | Disposition: A | Discharge status: Home / Self Care | Payer: Federal, State, Local not specified - PPO | Source: Ambulatory Visit | Attending: Family Medicine | Admitting: Family Medicine

## 2023-04-02 ENCOUNTER — Other Ambulatory Visit: Payer: Self-pay | Admitting: Family Medicine

## 2023-04-02 DIAGNOSIS — Z1231 Encounter for screening mammogram for malignant neoplasm of breast: Secondary | ICD-10-CM | POA: Diagnosis present

## 2023-05-31 ENCOUNTER — Ambulatory Visit
Admission: RE | Admit: 2023-05-31 | Discharge: 2023-05-31 | Disposition: A | Discharge status: Home / Self Care | Source: Ambulatory Visit | Attending: Gastroenterology | Admitting: Gastroenterology

## 2023-05-31 MED ORDER — MAGNESIUM SULFATE 4 GM/100ML IV SOLN
4.0000 g | Freq: Once | INTRAVENOUS | Status: AC
  Administered 2023-05-31: 4 g via INTRAVENOUS
  Filled 2023-05-31: qty 100

## 2023-06-01 ENCOUNTER — Ambulatory Visit
Admission: EM | Admit: 2023-06-01 | Discharge: 2023-06-01 | Disposition: A | Discharge status: Home / Self Care | Attending: Family Medicine | Admitting: Family Medicine

## 2023-06-01 ENCOUNTER — Encounter: Payer: Self-pay | Admitting: Emergency Medicine

## 2023-06-01 DIAGNOSIS — N390 Urinary tract infection, site not specified: Secondary | ICD-10-CM

## 2023-06-01 DIAGNOSIS — B3731 Acute candidiasis of vulva and vagina: Secondary | ICD-10-CM | POA: Diagnosis present

## 2023-06-01 LAB — URINALYSIS, W/ REFLEX TO CULTURE (INFECTION SUSPECTED)
Bilirubin Urine: NEGATIVE
Glucose, UA: NEGATIVE mg/dL
Hgb urine dipstick: NEGATIVE
Ketones, ur: NEGATIVE mg/dL
Nitrite: NEGATIVE
Protein, ur: NEGATIVE mg/dL
Specific Gravity, Urine: 1.01 (ref 1.005–1.030)
pH: 5 (ref 5.0–8.0)

## 2023-06-01 MED ORDER — NITROFURANTOIN MONOHYD MACRO 100 MG PO CAPS: 100.0000 mg | ORAL_CAPSULE | Freq: Two times a day (BID) | ORAL | 0 refills | Status: DC

## 2023-06-01 MED ORDER — PHENAZOPYRIDINE HCL 200 MG PO TABS: 200.0000 mg | ORAL_TABLET | Freq: Three times a day (TID) | ORAL | 0 refills | Status: AC

## 2023-06-01 MED ORDER — FLUCONAZOLE 150 MG PO TABS: 150.0000 mg | ORAL_TABLET | ORAL | 0 refills | Status: AC

## 2023-06-01 NOTE — ED Provider Notes (Signed)
 [facility]    CSN: [id number] Arrival date & time: 06/01/23  0947      History   Chief Complaint Chief Complaint  Patient presents with   Urinary Tract Infection    HPI MELLANIE BEJARANO is a 78 y.o. female.   HPI  78 year old female with past medical history significant for Crohn's disease, diabetes, collagen vascular disease, GERD, thyroid disease, anemia, hyperlipidemia, ankylosing spondylitis, CAD, and RA presents for evaluation of urinary urgency and frequency that started 2 to 3 days ago.  She has had some low back pain as well as some nausea.  She is attributing her nausea to her Crohn's disease because that is the frequent symptoms she has.  She denies any pain with urination, blood in her urine, cloudiness to her urine, abdominal pain above baseline, fever, vaginal discharge, or vaginal itching.  He does have a history of UTIs and has previously been hospitalized for urosepsis.  Past Medical History:  Diagnosis Date   Anemia    Ankylosing spondylitis (HCC)    Arthritis    Bilateral carotid artery stenosis    Collagen vascular disease (HCC)    Coronary artery disease    Crohn disease (HCC)    Diabetes mellitus without complication (HCC)    diet controlled   GERD (gastroesophageal reflux disease)    Hyperlipidemia    Hypothyroidism    Pneumonia    3/18   PONV (postoperative nausea and vomiting)    Rheumatoid arthritis (HCC)    Thyroid disease    goiter    Patient Active Problem List   Diagnosis Date Noted   Intractable headache 10/12/2021   Hypokalemia 10/11/2021   CVA (cerebral vascular accident) (HCC) 10/11/2021   TIA (transient ischemic attack) 10/10/2021   Temporal headache 08/18/2021   Dysarthria 08/18/2021   Stable angina (HCC) 04/03/2018   Right groin pain 06/22/2016   Acute bronchitis 05/01/2016   Diastasis recti 10/29/2015   Rectal bleeding 06/10/2014   AP (abdominal pain) 12/29/2013   Crohn's disease (HCC) 12/18/2013    Past  Surgical History:  Procedure Laterality Date   ABDOMINAL HYSTERECTOMY  1987   APPENDECTOMY     BREAST BIOPSY Bilateral    neg   BREAST EXCISIONAL BIOPSY Left    BREAST EXCISIONAL BIOPSY Right    BREAST EXCISIONAL BIOPSY Left    CATARACT EXTRACTION W/PHACO Right 11/06/2016   Procedure: CATARACT EXTRACTION PHACO AND INTRAOCULAR LENS PLACEMENT (IOC);  Surgeon: Galen Manila, MD;  Location: [facility] ORS;  Service: Ophthalmology;  Laterality: Right;  [place] 00:42.6 AP% 16.7 CDE 7.14 Fluid Pack Lot # [id number]   CATARACT EXTRACTION W/PHACO Left 12/25/2016   Procedure: CATARACT EXTRACTION PHACO AND INTRAOCULAR LENS PLACEMENT (IOC);  Surgeon: Galen Manila, MD;  Location: [facility] ORS;  Service: Ophthalmology;  Laterality: Left;  [place] 00:32 AP% 13.7 CDE 4.40 Fluid pack lot # [id number]   COLON SURGERY  06/06/2011   Resection of terminal ileum and previous ileocolonic anastomosis for recurrent Crohn's disease. 3 focal strictures identified.   COLONOSCOPY  2013   COLONOSCOPY WITH PROPOFOL N/A 07/28/2021   Procedure: COLONOSCOPY WITH PROPOFOL;  Surgeon: Regis Bill, MD;  Location: [facility] ENDOSCOPY;  Service: Endoscopy;  Laterality: N/A;  IDDM   JOINT REPLACEMENT Left    SHOULDER   LEFT HEART CATH AND CORONARY ANGIOGRAPHY Left 04/04/2018   Procedure: LEFT HEART CATH AND CORONARY ANGIOGRAPHY;  Surgeon: Lamar Blinks, MD;  Location: [facility] INV[facility]IVE [facility];  Service: Cardiovascular;  Laterality: Left;   SHOULDER  SURGERY Right 11/20/2018   Procedure: R SHOULDER ARTHROSCOPY: biceps tenomyotomy, distal claviculectomy, acromioplasty, posterior capsule release, debridement, decrompression of subacromial space, coracoacromial release, bursectomy   SMALL INTESTINE SURGERY  2012   THYROID SURGERY  1992   TONSILLECTOMY      OB History     Gravida  6   Para  4   Term      Preterm      AB  2   Living  4      SAB  2   IAB      Ectopic      Multiple      Live Births            Obstetric Comments  1st Menstrual Cycle:  14  1st Pregnancy: 19          Home Medications    Prior to Admission medications   Medication Sig Start Date End Date Taking? Authorizing Provider  fluconazole (DIFLUCAN) 150 MG tablet Take 1 tablet (150 mg total) by mouth every 3 (three) days for 3 doses. 06/01/23 06/08/23 Yes Kent Pear, NP  nitrofurantoin, macrocrystal-monohydrate, (MACROBID) 100 MG capsule Take 1 capsule (100 mg total) by mouth 2 (two) times daily. 06/01/23  Yes Kent Pear, NP  phenazopyridine (PYRIDIUM) 200 MG tablet Take 1 tablet (200 mg total) by mouth 3 (three) times daily. 06/01/23  Yes Kent Pear, NP  acetaminophen (TYLENOL) 500 MG tablet Take 1,000 mg by mouth every 8 (eight) hours as needed for mild pain or moderate pain.   Yes [provider]  adalimumab (HUMIRA, 2 PEN,) 40 MG/0.4ML pen Inject 40 mg into the skin every 14 (fourteen) days.   Yes [provider]  Ascorbic Acid (VITAMIN C) 1000 MG tablet Take 1,000 mg by mouth 3 (three) times a week.   Yes [provider]  budesonide (ENTOCORT EC) 3 MG 24 hr capsule Take 9 mg by mouth daily. 08/01/21  Yes [provider]  CINNAMON PO Take 1,200 mg by mouth daily.   Yes [provider]  cyanocobalamin (VITAMIN B12) 1000 MCG tablet Take 1,000 mcg by mouth daily.   Yes [provider]  Insulin Lispro w/ Trans Port 100 UNIT/ML SOPN SSI  2 units 150-200 4 units-201-250 6 units 251-300 8 units 301-350 10 units-351-400 >400 call MD 05/24/20  Yes [provider]  magnesium oxide (MAG-OX) 400 MG tablet Take 400 mg by mouth daily.   Yes [provider]  ondansetron (ZOFRAN-ODT) 4 MG disintegrating tablet Take by mouth. 08/30/21  Yes [provider]  Potassium Chloride 40 MEQ/15ML (20%) SOLN Take 15 ml by mouth once a week. 09/07/21 06/01/23 Yes [provider]  SYNTHROID 100 MCG tablet Take 100 mcg by mouth daily before breakfast. Take 6 days per  week. 04/27/14  Yes [provider]  THERATEARS 0.25 % SOLN Place 1 drop into both eyes 3 (three) times daily as needed (dry/irritated eyes.).   Yes [provider]  thyroid (ARMOUR) 30 MG tablet Take 30 mg by mouth daily before breakfast.    Yes [provider]  Turmeric 500 MG CAPS Take 500 mg by mouth daily.   Yes [provider]    Family History Family History  Problem Relation Age of Onset   Breast cancer Sister 27   Crohn's disease Mother    Heart attack Father 77    Social History Social History   Tobacco Use   Smoking status: Former    Current  packs/day: 0.00    Average packs/day: 1 pack/day for 12.0 years (12.0 ttl pk-yrs)    Types: Cigarettes    Start date: 10/19/1957    Quit date: 10/19/1969    Years since quitting: 53.6   Smokeless tobacco: Never  Vaping Use   Vaping status: Never Used  Substance Use Topics   Alcohol use: No   Drug use: No     Allergies   Erythromycin, Hydromorphone, Cefuroxime, Ciprocinonide [fluocinolone], Codeine, Floxin [ofloxacin], Keflex [cephalexin], Levofloxacin, Sulfa antibiotics, and Ciprofloxacin   Review of Systems Review of Systems  Constitutional:  Negative for fever.  Gastrointestinal:  Positive for nausea. Negative for abdominal pain.  Genitourinary:  Positive for frequency and urgency. Negative for dysuria, hematuria, vaginal discharge and vaginal pain.  Musculoskeletal:  Positive for back pain.     Physical Exam Triage Vital Signs ED Triage Vitals  Encounter Vitals Group     BP      Systolic BP Percentile      Diastolic BP Percentile      Pulse      Resp      Temp      Temp src      SpO2      Weight      Height      Head Circumference      Peak Flow      Pain Score      Pain Loc      Pain Education      Exclude from Growth Chart    No data found.  Updated Vital Signs BP (!) 169/81 (BP Location: Left Arm)   Pulse 81   Temp 97.7 F (36.5 C) (Oral)   Ht 5\' 8"  (1.727  m)   Wt 155 lb (70.3 kg)   SpO2 96%   BMI 23.57 kg/m   Visual Acuity Right Eye Distance:   Left Eye Distance:   Bilateral Distance:    Right Eye Near:   Left Eye Near:    Bilateral Near:     Physical Exam Vitals and nursing note reviewed.  Constitutional:      Appearance: Normal appearance. She is not ill-appearing.  HENT:     Head: Normocephalic and atraumatic.  Cardiovascular:     Rate and Rhythm: Normal rate and regular rhythm.     Pulses: Normal pulses.     Heart sounds: Normal heart sounds. No murmur heard.    No friction rub. No gallop.  Pulmonary:     Effort: Pulmonary effort is normal.     Breath sounds: Normal breath sounds. No wheezing, rhonchi or rales.  Neurological:     Mental Status: She is alert.      UC Treatments / Results  Labs (all labs ordered are listed, but only abnormal results are displayed) Labs Reviewed  URINALYSIS, W/ REFLEX TO CULTURE (INFECTION SUSPECTED) - Abnormal; Notable for the following components:      Result Value   Leukocytes,Ua SMALL (*)    Bacteria, UA FEW (*)    All other components within normal limits  URINE CULTURE  CERVICOVAGINAL ANCILLARY ONLY    EKG   Radiology No results found.  Procedures Procedures (including critical care time)  Medications Ordered in UC Medications - No data to display  Initial Impression / Assessment and Plan / UC Course  I have reviewed the triage vital signs and the nursing notes.  Pertinent labs & imaging results that were available during my care of the patient were reviewed by  me and considered in my medical decision making (see chart for details).   Patient is a pleasant, nontoxic-appearing 69-year female presenting for evaluation of UTI symptoms as outlined HPI above.  She has no CVA tenderness on exam and her urine specimen was clear in color, though it does have a strong odor.  I will order a urinalysis to evaluate for the presence of UTI.  Urinalysis shows small leukocyte  esterase but negative for nitrates, protein, or hemoglobin.  Reflex microscopy does show 11-20 WBCs with 6-10 RBCs, few bacteria, budding yeast, and hyaline cast present.  Urine will reflex to culture.  I will have staff collect a vaginal cytology swab to evaluate for the presence of BV and yeast given the urine results.  I will treat the patient for urinary tract infection with Macrobid 100 mg twice daily for 5 days along with Pyridium 200 mg every 8 hours as needed for urgency and frequency.  Additionally, I will treat her yeast infection with Diflucan 150 mg tablets, 1 tablet now and repeat dosing every 3 days for total of 3 doses.   Final Clinical Impressions(s) / UC Diagnoses   Final diagnoses:  Lower urinary tract infectious disease  Vaginal yeast infection     Discharge Instructions      Take the Macrobid twice daily for 5 days with food for treatment of urinary tract infection.  Use the Pyridium every 8 hours as needed for urinary discomfort.  This will turn your urine a bright red-orange.  Increase your oral fluid intake so that you increase your urine production and or flushing your urinary system.  Take an over-the-counter probiotic, such as Culturelle-Align-Activia, 1 hour after each dose of antibiotic to prevent diarrhea or yeast infections from forming.  We will culture urine and change the antibiotics if necessary.  You also had yeast present on your microscopic evaluation of your urine which indicates you potentially have a vaginal yeast infection.  We have collected a swab to confirm the presence of yeast as well as to assess for possible bacterial vaginosis which could also be causing your UTI symptoms and causing the bacteria to be present in your urine.  Take the Diflucan 150 mg tablets, 1 tablet now and repeat dosing every 3 days for total of 3 doses.  If your cytology swab returns the presence of bacterial vaginosis we will treat you with an additional antibiotic  called metronidazole.  Return for reevaluation, or see your primary care provider, for any new or worsening symptoms.      ED Prescriptions     Medication Sig Dispense Auth. Provider   fluconazole (DIFLUCAN) 150 MG tablet Take 1 tablet (150 mg total) by mouth every 3 (three) days for 3 doses. 3 tablet Kent Pear, NP   nitrofurantoin, macrocrystal-monohydrate, (MACROBID) 100 MG capsule Take 1 capsule (100 mg total) by mouth 2 (two) times daily. 10 capsule Kent Pear, NP   phenazopyridine (PYRIDIUM) 200 MG tablet Take 1 tablet (200 mg total) by mouth 3 (three) times daily. 6 tablet Kent Pear, NP      PDMP not reviewed this encounter.   Kent Pear, NP 06/01/23 1031

## 2023-06-01 NOTE — ED Triage Notes (Signed)
 Pt c/o urinary urgency and frequency x2-3days

## 2023-06-01 NOTE — Discharge Instructions (Signed)
 Take the Macrobid twice daily for 5 days with food for treatment of urinary tract infection.  Use the Pyridium every 8 hours as needed for urinary discomfort.  This will turn your urine a bright red-orange.  Increase your oral fluid intake so that you increase your urine production and or flushing your urinary system.  Take an over-the-counter probiotic, such as Culturelle-Align-Activia, 1 hour after each dose of antibiotic to prevent diarrhea or yeast infections from forming.  We will culture urine and change the antibiotics if necessary.  You also had yeast present on your microscopic evaluation of your urine which indicates you potentially have a vaginal yeast infection.  We have collected a swab to confirm the presence of yeast as well as to assess for possible bacterial vaginosis which could also be causing your UTI symptoms and causing the bacteria to be present in your urine.  Take the Diflucan 150 mg tablets, 1 tablet now and repeat dosing every 3 days for total of 3 doses.  If your cytology swab returns the presence of bacterial vaginosis we will treat you with an additional antibiotic called metronidazole.  Return for reevaluation, or see your primary care provider, for any new or worsening symptoms.

## 2023-06-02 LAB — URINE CULTURE

## 2023-06-05 LAB — CERVICOVAGINAL ANCILLARY ONLY
Bacterial Vaginitis (gardnerella): NEGATIVE
Candida Glabrata: NEGATIVE
Candida Vaginitis: NEGATIVE
Comment: NEGATIVE
Comment: NEGATIVE
Comment: NEGATIVE

## 2023-07-02 MED ORDER — MAGNESIUM SULFATE 4 GM/100ML IV SOLN: 4.0000 g | Freq: Once | INTRAVENOUS | Status: DC

## 2023-07-03 ENCOUNTER — Ambulatory Visit
Admission: RE | Admit: 2023-07-03 | Discharge: 2023-07-03 | Disposition: A | Discharge status: Home / Self Care | Source: Ambulatory Visit | Attending: Gastroenterology | Admitting: Gastroenterology

## 2023-07-03 MED ORDER — MAGNESIUM SULFATE 4 GM/100ML IV SOLN
4.0000 g | Freq: Once | INTRAVENOUS | Status: AC
  Administered 2023-07-03: 4 g via INTRAVENOUS
  Filled 2023-07-03: qty 100

## 2023-07-31 ENCOUNTER — Ambulatory Visit
Admission: RE | Admit: 2023-07-31 | Discharge: 2023-07-31 | Disposition: A | Discharge status: Home / Self Care | Source: Ambulatory Visit | Attending: Gastroenterology | Admitting: Gastroenterology

## 2023-07-31 MED ORDER — MAGNESIUM SULFATE 4 GM/100ML IV SOLN
4.0000 g | Freq: Once | INTRAVENOUS | Status: AC
  Administered 2023-07-31: 4 g via INTRAVENOUS
  Filled 2023-07-31 (×2): qty 100

## 2023-08-09 MED ORDER — MAGNESIUM SULFATE 4 GM/100ML IV SOLN: 4.0000 g | Freq: Once | INTRAVENOUS | Status: DC

## 2023-08-12 ENCOUNTER — Ambulatory Visit
Admission: RE | Admit: 2023-08-12 | Discharge: 2023-08-12 | Disposition: A | Discharge status: Home / Self Care | Source: Ambulatory Visit | Attending: Gastroenterology | Admitting: Gastroenterology

## 2023-08-12 MED ORDER — MAGNESIUM SULFATE 4 GM/100ML IV SOLN
4.0000 g | Freq: Once | INTRAVENOUS | Status: AC
  Administered 2023-08-12: 4 g via INTRAVENOUS
  Filled 2023-08-12: qty 100

## 2023-09-20 ENCOUNTER — Ambulatory Visit
Admission: RE | Admit: 2023-09-20 | Discharge: 2023-09-20 | Disposition: A | Discharge status: Home / Self Care | Source: Ambulatory Visit | Attending: Gastroenterology | Admitting: Gastroenterology

## 2023-09-20 MED ORDER — MAGNESIUM SULFATE 4 GM/100ML IV SOLN
4.0000 g | Freq: Once | INTRAVENOUS | Status: AC
  Administered 2023-09-20: 4 g via INTRAVENOUS
  Filled 2023-09-20: qty 100

## 2023-09-30 ENCOUNTER — Ambulatory Visit
Admission: RE | Admit: 2023-09-30 | Discharge: 2023-09-30 | Disposition: A | Discharge status: Home / Self Care | Source: Ambulatory Visit | Attending: Gastroenterology | Admitting: Gastroenterology

## 2023-09-30 MED ORDER — MAGNESIUM SULFATE 4 GM/100ML IV SOLN
4.0000 g | Freq: Once | INTRAVENOUS | Status: AC
  Administered 2023-09-30 (×2): 4 g via INTRAVENOUS
  Filled 2023-09-30: qty 100

## 2023-10-04 ENCOUNTER — Encounter: Payer: Self-pay | Admitting: *Deleted

## 2023-10-14 ENCOUNTER — Ambulatory Visit: Admitting: Anesthesiology

## 2023-10-14 ENCOUNTER — Encounter
Admission: RE | Disposition: A | Discharge status: Home / Self Care | Payer: Self-pay | Source: Home / Self Care | Attending: Gastroenterology

## 2023-10-14 ENCOUNTER — Ambulatory Visit
Admission: RE | Admit: 2023-10-14 | Discharge: 2023-10-14 | Disposition: A | Discharge status: Home / Self Care | Attending: Gastroenterology | Admitting: Gastroenterology

## 2023-10-14 DIAGNOSIS — K5750 Diverticulosis of both small and large intestine without perforation or abscess without bleeding: Secondary | ICD-10-CM | POA: Diagnosis not present

## 2023-10-14 DIAGNOSIS — Z794 Long term (current) use of insulin: Secondary | ICD-10-CM | POA: Insufficient documentation

## 2023-10-14 DIAGNOSIS — Z7952 Long term (current) use of systemic steroids: Secondary | ICD-10-CM | POA: Diagnosis not present

## 2023-10-14 DIAGNOSIS — I2510 Atherosclerotic heart disease of native coronary artery without angina pectoris: Secondary | ICD-10-CM | POA: Diagnosis not present

## 2023-10-14 DIAGNOSIS — K219 Gastro-esophageal reflux disease without esophagitis: Secondary | ICD-10-CM | POA: Insufficient documentation

## 2023-10-14 DIAGNOSIS — E119 Type 2 diabetes mellitus without complications: Secondary | ICD-10-CM | POA: Diagnosis not present

## 2023-10-14 DIAGNOSIS — K5080 Crohn's disease of both small and large intestine without complications: Secondary | ICD-10-CM | POA: Diagnosis present

## 2023-10-14 DIAGNOSIS — E785 Hyperlipidemia, unspecified: Secondary | ICD-10-CM | POA: Diagnosis not present

## 2023-10-14 DIAGNOSIS — M069 Rheumatoid arthritis, unspecified: Secondary | ICD-10-CM | POA: Insufficient documentation

## 2023-10-14 DIAGNOSIS — K222 Esophageal obstruction: Secondary | ICD-10-CM | POA: Diagnosis not present

## 2023-10-14 DIAGNOSIS — Z87891 Personal history of nicotine dependence: Secondary | ICD-10-CM | POA: Diagnosis not present

## 2023-10-14 DIAGNOSIS — K640 First degree hemorrhoids: Secondary | ICD-10-CM | POA: Diagnosis not present

## 2023-10-14 DIAGNOSIS — R1084 Generalized abdominal pain: Secondary | ICD-10-CM | POA: Diagnosis not present

## 2023-10-14 DIAGNOSIS — E039 Hypothyroidism, unspecified: Secondary | ICD-10-CM | POA: Diagnosis not present

## 2023-10-14 HISTORY — PX: COLONOSCOPY: SHX5424

## 2023-10-14 HISTORY — PX: ESOPHAGOGASTRODUODENOSCOPY: SHX5428

## 2023-10-14 LAB — GLUCOSE, CAPILLARY: Glucose-Capillary: 100 mg/dL — ABNORMAL HIGH (ref 70–99)

## 2023-10-14 SURGERY — COLONOSCOPY
Anesthesia: General

## 2023-10-14 MED ORDER — LIDOCAINE HCL (PF) 2 % IJ SOLN
INTRAMUSCULAR | Status: AC
  Filled 2023-10-14: qty 5

## 2023-10-14 MED ORDER — PROPOFOL 10 MG/ML IV BOLUS
INTRAVENOUS | Status: DC | PRN
  Administered 2023-10-14: 75 mg via INTRAVENOUS

## 2023-10-14 MED ORDER — SODIUM CHLORIDE 0.9 % IV SOLN: INTRAVENOUS | Status: DC

## 2023-10-14 MED ORDER — LIDOCAINE HCL (CARDIAC) PF 100 MG/5ML IV SOSY
PREFILLED_SYRINGE | INTRAVENOUS | Status: DC | PRN
  Administered 2023-10-14: 70 mg via INTRAVENOUS

## 2023-10-14 MED ORDER — PROPOFOL 1000 MG/100ML IV EMUL
INTRAVENOUS | Status: AC
  Filled 2023-10-14: qty 100

## 2023-10-14 MED ORDER — PROPOFOL 500 MG/50ML IV EMUL
INTRAVENOUS | Status: DC | PRN
  Administered 2023-10-14: 120 ug/kg/min via INTRAVENOUS

## 2023-10-14 NOTE — Interval H&P Note (Signed)
 History and Physical Interval Note:  10/14/2023 8:34 AM  Joanna Ortiz  has presented today for surgery, with the diagnosis of CROHNS GENERAL ABDOMINAL PAIN.  The various methods of treatment have been discussed with the patient and family. After consideration of risks, benefits and other options for treatment, the patient has consented to  Procedure(s): COLONOSCOPY (N/A) EGD (ESOPHAGOGASTRODUODENOSCOPY) (N/A) as a surgical intervention.  The patient's history has been reviewed, patient examined, no change in status, stable for surgery.  I have reviewed the patient's chart and labs.  Questions were answered to the patient's satisfaction.     Joanna Ortiz  Ok to proceed with EGD/Colonoscopy

## 2023-10-14 NOTE — Anesthesia Postprocedure Evaluation (Signed)
 Anesthesia Post Note  Patient: NATISHA TRZCINSKI  Procedure(s) Performed: COLONOSCOPY EGD (ESOPHAGOGASTRODUODENOSCOPY) Balloon dilation wire-guided  Patient location during evaluation: PACU Anesthesia Type: General Level of consciousness: awake and awake and alert Pain management: satisfactory to patient Vital Signs Assessment: post-procedure vital signs reviewed and stable Respiratory status: spontaneous breathing Cardiovascular status: stable Anesthetic complications: no   There were no known notable events for this encounter.   Last Vitals:  Vitals:   10/14/23 0930 10/14/23 0938  BP:  133/70  Pulse: 69 64  Resp: (!) 22 18  Temp:    SpO2:  100%    Last Pain:  Vitals:   10/14/23 0916  TempSrc: Temporal  PainSc:                  VAN STAVEREN,Lynnet Hefley

## 2023-10-14 NOTE — Anesthesia Preprocedure Evaluation (Signed)
 Anesthesia Evaluation  Patient identified by MRN, date of birth, ID band Patient awake    Reviewed: Allergy & Precautions, NPO status , Patient's Chart, lab work & pertinent test results  Airway Mallampati: II  TM Distance: >3 FB Neck ROM: full    Dental  (+) Upper Dentures, Lower Dentures   Pulmonary neg pulmonary ROS, Patient abstained from smoking., former smoker   Pulmonary exam normal breath sounds clear to auscultation       Cardiovascular Exercise Tolerance: Good + CAD  negative cardio ROS Normal cardiovascular exam Rhythm:Regular Rate:Normal     Neuro/Psych  Headaches negative neurological ROS  negative psych ROS   GI/Hepatic negative GI ROS, Neg liver ROS,GERD  Medicated,,  Endo/Other  negative endocrine ROSdiabetes, Well Controlled, Type 1, Insulin  DependentHypothyroidism    Renal/GU negative Renal ROS  negative genitourinary   Musculoskeletal   Abdominal Normal abdominal exam  (+)   Peds negative pediatric ROS (+)  Hematology negative hematology ROS (+) Blood dyscrasia, anemia   Anesthesia Other Findings Past Medical History: No date: Anemia No date: Ankylosing spondylitis (HCC) No date: Arthritis No date: Bilateral carotid artery stenosis No date: Collagen vascular disease (HCC) No date: Coronary artery disease No date: Crohn disease (HCC) No date: Diabetes mellitus without complication (HCC)     Comment:  diet controlled No date: GERD (gastroesophageal reflux disease) No date: Hyperlipidemia No date: Hypothyroidism No date: Pneumonia     Comment:  3/18 No date: PONV (postoperative nausea and vomiting) No date: Rheumatoid arthritis (HCC) No date: Thyroid  disease     Comment:  goiter  Past Surgical History: 1987: ABDOMINAL HYSTERECTOMY No date: APPENDECTOMY No date: BREAST BIOPSY; Bilateral     Comment:  neg No date: BREAST EXCISIONAL BIOPSY; Left No date: BREAST EXCISIONAL BIOPSY;  Right No date: BREAST EXCISIONAL BIOPSY; Left 11/06/2016: CATARACT EXTRACTION W/PHACO; Right     Comment:  Procedure: CATARACT EXTRACTION PHACO AND INTRAOCULAR               LENS PLACEMENT (IOC);  Surgeon: Jaye Fallow, MD;                Location: [facility] ORS;  Service: Ophthalmology;  Laterality:              Right;  US  00:42.6 AP% 16.7 CDE 7.14 Fluid Pack Lot #               [id number] 12/25/2016: CATARACT EXTRACTION W/PHACO; Left     Comment:  Procedure: CATARACT EXTRACTION PHACO AND INTRAOCULAR               LENS PLACEMENT (IOC);  Surgeon: Jaye Fallow, MD;                Location: [facility] ORS;  Service: Ophthalmology;  Laterality:              Left;  US  00:32 AP% 13.7 CDE 4.40 Fluid pack lot #               [id number] 06/06/2011: COLON SURGERY     Comment:  Resection of terminal ileum and previous ileocolonic               anastomosis for recurrent Crohn's disease. 3 focal               strictures identified. 2013: COLONOSCOPY 07/28/2021: COLONOSCOPY WITH PROPOFOL ; N/A     Comment:  Procedure: COLONOSCOPY WITH PROPOFOL ;  Surgeon:  Maryruth Ole DASEN, MD;  Location: [facility] ENDOSCOPY;                Service: Endoscopy;  Laterality: N/A;  IDDM No date: JOINT REPLACEMENT; Left     Comment:  SHOULDER 04/04/2018: LEFT HEART CATH AND CORONARY ANGIOGRAPHY; Left     Comment:  Procedure: LEFT HEART CATH AND CORONARY ANGIOGRAPHY;                Surgeon: Hester Wolm PARAS, MD;  Location: [facility] INVASIVE               CV LAB;  Service: Cardiovascular;  Laterality: Left; 11/20/2018: SHOULDER SURGERY; Right     Comment:  Procedure: R SHOULDER ARTHROSCOPY: biceps tenomyotomy,               distal claviculectomy, acromioplasty, posterior capsule               release, debridement, decrompression of subacromial               space, coracoacromial release, bursectomy 2012: SMALL INTESTINE SURGERY 1992: THYROID  SURGERY No date: TONSILLECTOMY  BMI    Body Mass Index: 23.26  kg/m      Reproductive/Obstetrics negative OB ROS                              Anesthesia Physical Anesthesia Plan  ASA: 3  Anesthesia Plan: General   Post-op Pain Management:    Induction: Intravenous  PONV Risk Score and Plan: Propofol  infusion and TIVA  Airway Management Planned: Natural Airway and Nasal Cannula  Additional Equipment:   Intra-op Plan:   Post-operative Plan:   Informed Consent: I have reviewed the patients History and Physical, chart, labs and discussed the procedure including the risks, benefits and alternatives for the proposed anesthesia with the patient or authorized representative who has indicated his/her understanding and acceptance.     Dental Advisory Given  Plan Discussed with: CRNA  Anesthesia Plan Comments:          Anesthesia Quick Evaluation

## 2023-10-14 NOTE — Op Note (Signed)
 [facility] Gastroenterology Patient Name: Joanna Ortiz Procedure Date: 10/14/2023 8:36 AM MRN: [id number] Account #: [id number] Date of Birth: 1945/05/19 Admit Type: Outpatient Age: 78 Room: [facility] ENDO ROOM 3 Gender: Female Note Status: Finalized Instrument Name: Barnie GI Scope [id number] Procedure:             Upper GI endoscopy Indications:           Generalized abdominal pain Providers:             Ole Schick MD, MD Referring MD:          Sionne A. Zachary MD, MD (Referring MD) Medicines:             Monitored Anesthesia Care Complications:         No immediate complications. Estimated blood loss:                         Minimal. Procedure:             Pre-Anesthesia Assessment:                        - Prior to the procedure, a History and Physical was                         performed, and patient medications and allergies were                         reviewed. The patient is competent. The risks and                         benefits of the procedure and the sedation options and                         risks were discussed with the patient. All questions                         were answered and informed consent was obtained.                         Patient identification and proposed procedure were                         verified by the physician, the nurse, the                         anesthesiologist, the anesthetist and the technician                         in the endoscopy suite. Mental Status Examination:                         alert and oriented. Airway Examination: normal                         oropharyngeal airway and neck mobility. Respiratory                         Examination: clear to auscultation. CV Examination:  normal. Prophylactic Antibiotics: The patient does not                         require prophylactic antibiotics. Prior                         Anticoagulants: The patient has taken no anticoagulant                          or antiplatelet agents. ASA Grade Assessment: III - A                         patient with severe systemic disease. After reviewing                         the risks and benefits, the patient was deemed in                         satisfactory condition to undergo the procedure. The                         anesthesia plan was to use monitored anesthesia care                         (MAC). Immediately prior to administration of                         medications, the patient was re-assessed for adequacy                         to receive sedatives. The heart rate, respiratory                         rate, oxygen saturations, blood pressure, adequacy of                         pulmonary ventilation, and response to care were                         monitored throughout the procedure. The physical                         status of the patient was re-assessed after the                         procedure.                        After obtaining informed consent, the endoscope was                         passed under direct vision. Throughout the procedure,                         the patient's blood pressure, pulse, and oxygen                         saturations were monitored continuously. The Endoscope  was introduced through the mouth, and advanced to the                         second part of duodenum. The upper GI endoscopy was                         accomplished without difficulty. The patient tolerated                         the procedure well. Findings:      A low-grade of narrowing Schatzki ring was found in the lower third of       the esophagus. A TTS dilator was passed through the scope. Dilation with       a 15-16.5-18 mm balloon dilator was performed to 18 mm. The dilation       site was examined and showed moderate mucosal disruption. Estimated       blood loss was minimal.      The exam of the esophagus was otherwise normal.      The  entire examined stomach was normal.      A large non-bleeding diverticulum was found in the second portion of the       duodenum.      The exam of the duodenum was otherwise normal. Impression:            - Low-grade of narrowing Schatzki ring. Dilated.                        - Normal stomach.                        - Non-bleeding duodenal diverticulum.                        - No specimens collected. Recommendation:        - Discharge patient to home.                        - Resume previous diet.                        - Continue present medications.                        - Return to referring physician as previously                         scheduled. Procedure Code(s):     --- Professional ---                        [phone], Esophagogastroduodenoscopy, flexible,                         transoral; with transendoscopic balloon dilation of                         esophagus (less than 30 mm diameter) Diagnosis Code(s):     --- Professional ---                        K22.2, Esophageal obstruction  R10.84, Generalized abdominal pain                        K57.10, Diverticulosis of small intestine without                         perforation or abscess without bleeding CPT copyright 2022 American [employer] Association. All rights reserved. The codes documented in this report are preliminary and upon coder review may  be revised to meet current compliance requirements. Ole Schick MD, MD 10/14/2023 9:15:19 AM Number of Addenda: 0 Note Initiated On: 10/14/2023 8:36 AM Estimated Blood Loss:  Estimated blood loss was minimal.      [facility]

## 2023-10-14 NOTE — Transfer of Care (Signed)
 Immediate Anesthesia Transfer of Care Note  Patient: Joanna Ortiz  Procedure(s) Performed: COLONOSCOPY EGD (ESOPHAGOGASTRODUODENOSCOPY) Balloon dilation wire-guided  Patient Location: PACU  Anesthesia Type:General  Level of Consciousness: awake and sedated  Airway & Oxygen Therapy: Patient Spontanous Breathing and Patient connected to nasal cannula oxygen  Post-op Assessment: Report given to RN and Post -op Vital signs reviewed and stable  Post vital signs: Reviewed and stable  Last Vitals:  Vitals Value Taken Time  BP 117/69 10/14/23 09:16  Temp    Pulse 71 10/14/23 09:16  Resp 17 10/14/23 09:16  SpO2 99 % 10/14/23 09:16    Last Pain:  Vitals:   10/14/23 0721  PainSc: 0-No pain         Complications: There were no known notable events for this encounter.

## 2023-10-14 NOTE — Op Note (Signed)
 [facility] Gastroenterology Patient Name: Joanna Ortiz Procedure Date: 10/14/2023 8:35 AM MRN: [id number] Account #: [id number] Date of Birth: 04/03/1945 Admit Type: Outpatient Age: 78 Room: [facility] ENDO ROOM 3 Gender: Female Note Status: Finalized Instrument Name: Colon Scope [id number] Procedure:             Colonoscopy Indications:           Disease activity assessment of Crohn's disease of the                         small bowel and colon Providers:             Ole Schick MD, MD Referring MD:          Sionne A. Zachary MD, MD (Referring MD) Medicines:             Monitored Anesthesia Care Complications:         No immediate complications. Estimated blood loss:                         Minimal. Procedure:             Pre-Anesthesia Assessment:                        - Prior to the procedure, a History and Physical was                         performed, and patient medications and allergies were                         reviewed. The patient is competent. The risks and                         benefits of the procedure and the sedation options and                         risks were discussed with the patient. All questions                         were answered and informed consent was obtained.                         Patient identification and proposed procedure were                         verified by the physician, the nurse, the                         anesthesiologist, the anesthetist and the technician                         in the endoscopy suite. Mental Status Examination:                         alert and oriented. Airway Examination: normal                         oropharyngeal airway and neck mobility. Respiratory  Examination: clear to auscultation. CV Examination:                         normal. Prophylactic Antibiotics: The patient does not                         require prophylactic antibiotics. Prior                          Anticoagulants: The patient has taken no anticoagulant                         or antiplatelet agents. ASA Grade Assessment: III - A                         patient with severe systemic disease. After reviewing                         the risks and benefits, the patient was deemed in                         satisfactory condition to undergo the procedure. The                         anesthesia plan was to use monitored anesthesia care                         (MAC). Immediately prior to administration of                         medications, the patient was re-assessed for adequacy                         to receive sedatives. The heart rate, respiratory                         rate, oxygen saturations, blood pressure, adequacy of                         pulmonary ventilation, and response to care were                         monitored throughout the procedure. The physical                         status of the patient was re-assessed after the                         procedure.                        After obtaining informed consent, the colonoscope was                         passed under direct vision. Throughout the procedure,                         the patient's blood pressure, pulse, and oxygen  saturations were monitored continuously. The was                         introduced through the anus and advanced to the the                         ileocolonic anastomosis. The colonoscopy was performed                         without difficulty. The patient tolerated the                         procedure well. The quality of the bowel preparation                         was good. The terminal ileum and the rectum were                         photographed. Findings:      The perianal and digital rectal examinations were normal.      Inflammation was found at 10 cm from the Ileocolonic Anastomosis. This       was very mild. The inflammation was graded as Rutgeerts  Score i1 (five       or fewer aphthous lesions). Biopsies were taken with a cold forceps for       histology. Estimated blood loss was minimal.      Scattered small-mouthed diverticula were found in the sigmoid colon,       descending colon and transverse colon.      Normal mucosa was found in the entire colon. Biopsies were taken with a       cold forceps for histology. Estimated blood loss was minimal.      Internal hemorrhoids were found during retroflexion. The hemorrhoids       were Grade I (internal hemorrhoids that do not prolapse).      The exam was otherwise without abnormality on direct and retroflexion       views. Impression:            - Crohn's disease. Inflammation was found. This was                         graded as Rutgeerts Score i1 (five or fewer aphthous                         lesions). Biopsied. Only two small ulcers were noted.                        - Diverticulosis in the sigmoid colon, in the                         descending colon and in the transverse colon.                        - Normal mucosa in the entire examined colon. Biopsied.                        - Internal hemorrhoids.                        -  The examination was otherwise normal on direct and                         retroflexion views. Recommendation:        - Discharge patient to home.                        - Resume previous diet.                        - Continue present medications.                        - Await pathology results.                        - Repeat colonoscopy for surveillance based on                         pathology results.                        - Return to referring physician as previously                         scheduled. Procedure Code(s):     --- Professional ---                        [phone], Colonoscopy, flexible; with biopsy, single or                         multiple Diagnosis Code(s):     --- Professional ---                        K64.0, First degree  hemorrhoids                        K50.80, Crohn's disease of both small and large                         intestine without complications                        K57.30, Diverticulosis of large intestine without                         perforation or abscess without bleeding CPT copyright 2022 American Medical Association. All rights reserved. The codes documented in this report are preliminary and upon coder review may  be revised to meet current compliance requirements. Ole Schick MD, MD 10/14/2023 9:24:36 AM Number of Addenda: 0 Note Initiated On: 10/14/2023 8:35 AM Scope Withdrawal Time: 0 hours 8 minutes 15 seconds  Total Procedure Duration: 0 hours 16 minutes 45 seconds  Estimated Blood Loss:  Estimated blood loss was minimal.      [facility]

## 2023-10-14 NOTE — H&P (Signed)
 Outpatient short stay form Pre-procedure 10/14/2023  Joanna ONEIDA Schick, MD  Primary Physician: George, Sionne A, MD  Reason for visit:  Abdominal pain/Crohns disease activity assessment  History of present illness:    78 y/o lady with history of ileocolonic crohns s/p ileocectomy, hypothyroidism, and DM II here for EGD/Colonoscopy for abdominal pain and crohns disease activity assessment. No blood thinners. Mother likely had crohns.    Current Facility-Administered Medications:    0.9 %  sodium chloride  infusion, , Intravenous, Continuous, Lucresia Simic, Joanna ONEIDA, MD, Last Rate: 20 mL/hr at 10/14/23 0825, Continued from Pre-op at 10/14/23 0825  Facility-Administered Medications Ordered in Other Encounters:    magnesium  sulfate IVPB 4 g 100 mL, 4 g, Intravenous, Once, Kam Rahimi, Joanna ONEIDA, MD  Medications Prior to Admission  Medication Sig Dispense Refill Last Dose/Taking   SYNTHROID  100 MCG tablet Take 100 mcg by mouth daily before breakfast. Take 6 days per week.  11 10/14/2023 at  6:00 AM   acetaminophen  (TYLENOL ) 500 MG tablet Take 1,000 mg by mouth every 8 (eight) hours as needed for mild pain or moderate pain.      adalimumab (HUMIRA, 2 PEN,) 40 MG/0.4ML pen Inject 40 mg into the skin every 14 (fourteen) days.   10/12/2023 Morning   Ascorbic Acid (VITAMIN C) 1000 MG tablet Take 1,000 mg by mouth 3 (three) times a week.      budesonide  (ENTOCORT EC ) 3 MG 24 hr capsule Take 9 mg by mouth daily.      CINNAMON PO Take 1,200 mg by mouth daily.      cyanocobalamin  (VITAMIN B12) 1000 MCG tablet Take 1,000 mcg by mouth daily.      Insulin  Lispro w/ Trans Port 100 UNIT/ML SOPN SSI  2 units 150-200 4 units-201-250 6 units 251-300 8 units 301-350 10 units-351-400 >400 call MD (Patient not taking: Reported on 10/14/2023)   Not Taking   magnesium  oxide (MAG-OX) 400 MG tablet Take 400 mg by mouth daily.      nitrofurantoin , macrocrystal-monohydrate, (MACROBID ) 100 MG capsule Take 1 capsule (100 mg  total) by mouth 2 (two) times daily. 10 capsule 0    ondansetron  (ZOFRAN -ODT) 4 MG disintegrating tablet Take by mouth.      phenazopyridine  (PYRIDIUM ) 200 MG tablet Take 1 tablet (200 mg total) by mouth 3 (three) times daily. 6 tablet 0    Potassium Chloride  40 MEQ/15ML (20%) SOLN Take 15 ml by mouth once a week.      THERATEARS 0.25 % SOLN Place 1 drop into both eyes 3 (three) times daily as needed (dry/irritated eyes.).      thyroid  (ARMOUR) 30 MG tablet Take 30 mg by mouth daily before breakfast.       Turmeric 500 MG CAPS Take 500 mg by mouth daily.        Allergies  Allergen Reactions   Erythromycin Hives   Hydromorphone Hives   Cefuroxime  Nausea And Vomiting   Ciprocinonide [Fluocinolone]     Muscle pain    Codeine     Other reaction(s): Vomiting   Floxin [Ofloxacin] Nausea And Vomiting   Keflex [Cephalexin] Nausea And Vomiting   Levofloxacin Nausea And Vomiting   Sulfa Antibiotics Nausea And Vomiting   Ciprofloxacin Nausea And Vomiting     Past Medical History:  Diagnosis Date   Anemia    Ankylosing spondylitis (HCC)    Arthritis    Bilateral carotid artery stenosis    Collagen vascular disease (HCC)    Coronary artery disease  Crohn disease (HCC)    Diabetes mellitus without complication (HCC)    diet controlled   GERD (gastroesophageal reflux disease)    Hyperlipidemia    Hypothyroidism    Pneumonia    3/18   PONV (postoperative nausea and vomiting)    Rheumatoid arthritis (HCC)    Thyroid  disease    goiter    Review of systems:  Otherwise negative.    Physical Exam  Gen: Alert, oriented. Appears stated age.  HEENT: PERRLA. Lungs: No respiratory distress CV: RRR Abd: soft, benign, no masses Ext: No edema    Planned procedures: Proceed with EGD/colonoscopy. The patient understands the nature of the planned procedure, indications, risks, alternatives and potential complications including but not limited to bleeding, infection, perforation,  damage to internal organs and possible oversedation/side effects from anesthesia. The patient agrees and gives consent to proceed.  Please refer to procedure notes for findings, recommendations and patient disposition/instructions.     Joanna ONEIDA Schick, MD [facility] Gastroenterology

## 2023-10-15 LAB — SURGICAL PATHOLOGY

## 2023-11-04 ENCOUNTER — Ambulatory Visit
Admission: RE | Admit: 2023-11-04 | Discharge: 2023-11-04 | Disposition: A | Discharge status: Home / Self Care | Source: Ambulatory Visit | Attending: Gastroenterology | Admitting: Gastroenterology

## 2023-11-04 DIAGNOSIS — K5000 Crohn's disease of small intestine without complications: Secondary | ICD-10-CM | POA: Diagnosis present

## 2023-11-04 MED ORDER — MAGNESIUM SULFATE 4 GM/100ML IV SOLN
4.0000 g | Freq: Once | INTRAVENOUS | Status: AC
Start: 2023-11-04 — End: 2023-11-04
  Administered 2023-11-04: 4 g via INTRAVENOUS
  Filled 2023-11-04: qty 100

## 2023-11-29 ENCOUNTER — Ambulatory Visit: Admission: EM | Admit: 2023-11-29 | Discharge: 2023-11-29 | Disposition: A | Discharge status: Home / Self Care

## 2023-11-29 DIAGNOSIS — Z9841 Cataract extraction status, right eye: Secondary | ICD-10-CM | POA: Insufficient documentation

## 2023-11-29 DIAGNOSIS — L03032 Cellulitis of left toe: Secondary | ICD-10-CM

## 2023-11-29 DIAGNOSIS — M79675 Pain in left toe(s): Secondary | ICD-10-CM | POA: Diagnosis not present

## 2023-11-29 MED ORDER — DOXYCYCLINE HYCLATE 100 MG PO CAPS: 100.0000 mg | ORAL_CAPSULE | Freq: Two times a day (BID) | ORAL | 0 refills | Status: AC

## 2023-11-29 MED ORDER — MUPIROCIN 2 % EX OINT: 1.0000 | TOPICAL_OINTMENT | Freq: Two times a day (BID) | CUTANEOUS | 0 refills | Status: AC

## 2023-11-29 NOTE — ED Triage Notes (Signed)
 Pt c/o Left 3rd toe injury x1day  Pt states that she was in the garden and believes she may have hit her toe  Pt has redness and swelling along the 3rd toe of the left foot  Pt states that she had redness going down her toe and on the top of her foot but it has gone away.

## 2023-11-29 NOTE — ED Provider Notes (Signed)
 [facility]    CSN: [id number] Arrival date & time: 11/29/23  1342      History   Chief Complaint Chief Complaint  Patient presents with   Toe Injury         HPI Joanna Ortiz is a 78 y.o. female.   78 year old female, Joanna Ortiz, presents to urgent care for evaluation of left 3rd toe injury/pain x 1 day. Pt states she was outside and doesn't remmeber injurying her toe but it is red and swollen at cuticle base of 3rd toe(left foot).   The history is provided by the patient. No language interpreter was used.    Past Medical History:  Diagnosis Date   Anemia    Ankylosing spondylitis (HCC)    Arthritis    Bilateral carotid artery stenosis    Collagen vascular disease    Coronary artery disease    Crohn disease (HCC)    Diabetes mellitus without complication (HCC)    diet controlled   GERD (gastroesophageal reflux disease)    Hyperlipidemia    Hypothyroidism    Pneumonia    3/18   PONV (postoperative nausea and vomiting)    Rheumatoid arthritis (HCC)    Thyroid  disease    goiter    Patient Active Problem List   Diagnosis Date Noted   History of bilateral cataract extraction 11/29/2023   Cellulitis of toe of left foot 11/29/2023   Pain in left toe(s) 11/29/2023   Other specified arthritis, right hand 03/30/2023   Pain in finger 03/27/2023   Aortic atherosclerosis 11/21/2021   Seasonal allergic rhinitis 11/21/2021   Intractable headache 10/12/2021   Hypokalemia 10/11/2021   CVA (cerebral vascular accident) (HCC) 10/11/2021   TIA (transient ischemic attack) 10/10/2021   PVD (posterior vitreous detachment), left eye 09/22/2021   Temporal headache 08/18/2021   Dysarthria 08/18/2021   Seronegative rheumatoid arthritis (HCC) 04/26/2021   Bilateral carotid artery stenosis 02/28/2021   Numbness and tingling of both feet 04/15/2020   Neuropathy 01/07/2020   Abnormal SPEP 12/30/2019   Partial small bowel obstruction (HCC) 04/24/2019    Rupture of long head biceps tendon, right, sequela 11/04/2018   Coronary artery disease involving native coronary artery of native heart 04/21/2018   Stable angina 04/03/2018   DISH (diffuse idiopathic skeletal hyperostosis) 03/19/2018   Abnormal ECG 03/11/2018   Hyperlipidemia, mixed 03/11/2018   SOBOE (shortness of breath on exertion) 03/11/2018   Cervical spondylosis without myelopathy 01/20/2018   Ankylosing spondylitis of multiple sites in spine (HCC) 09/18/2017   Arthritis associated with inflammatory bowel disease 09/18/2017   Musculoskeletal pain, chronic 04/30/2017   Onychomycosis 02/13/2017   DJD (degenerative joint disease), cervical 09/14/2016   Right groin pain 06/22/2016   Hypomagnesemia 06/02/2016   Acute bronchitis 05/01/2016   Diastasis recti 10/29/2015   History of Clostridium difficile infection 08/10/2015   Hyperglycemia 05/26/2015   Cataract of both eyes 04/14/2015   Dry eyes 04/14/2015   Epithelial (juvenile) corneal dystrophy, 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   History of left shoulder replacement 12/29/2014   Rectal bleeding 06/10/2014   Hypertriglyceridemia 04/02/2014   Hypothyroidism, unspecified 04/02/2014   AP (abdominal pain) 12/29/2013   Crohn's disease (HCC) 12/18/2013   Bile salt-induced diarrhea 08/31/2013   Deficiency of other specified B group vitamins 08/31/2013   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   Palpitations 03/11/2012   Skin lesion of face 09/19/2011   Encounter for long-term (current) drug use 09/03/2011   Bacterial  overgrowth syndrome 05/02/2011   Increased frequency of urination 05/02/2011   Long-term use of immunosuppressant medication 05/02/2011   Anemia 04/02/2011    Past Surgical History:  Procedure Laterality Date   ABDOMINAL HYSTERECTOMY  1987   APPENDECTOMY     BREAST BIOPSY Bilateral    neg   BREAST EXCISIONAL BIOPSY Left    BREAST EXCISIONAL BIOPSY Right    BREAST EXCISIONAL  BIOPSY Left    CATARACT EXTRACTION W/PHACO Right 11/06/2016   Procedure: CATARACT EXTRACTION PHACO AND INTRAOCULAR LENS PLACEMENT (IOC);  Surgeon: Jaye Fallow, MD;  Location: [facility];  Service: Ophthalmology;  Laterality: Right;  US  00:42.6 AP% 16.7 CDE 7.14 Fluid Pack Lot # [id number]   CATARACT EXTRACTION W/PHACO Left 12/25/2016   Procedure: CATARACT EXTRACTION PHACO AND INTRAOCULAR LENS PLACEMENT (IOC);  Surgeon: Jaye Fallow, MD;  Location: [facility];  Service: Ophthalmology;  Laterality: Left;  US  00:32 AP% 13.7 CDE 4.40 Fluid pack lot # [id number]   COLON SURGERY  06/06/2011   Resection of terminal ileum and previous ileocolonic anastomosis for recurrent Crohn's disease. 3 focal strictures identified.   COLONOSCOPY  2013   COLONOSCOPY N/A 10/14/2023   Procedure: COLONOSCOPY;  Surgeon: Maryruth Ole DASEN, MD;  Location: [facility] ENDOSCOPY;  Service: Endoscopy;  Laterality: N/A;   COLONOSCOPY WITH PROPOFOL  N/A 07/28/2021   Procedure: COLONOSCOPY WITH PROPOFOL ;  Surgeon: Maryruth Ole DASEN, MD;  Location: [facility] ENDOSCOPY;  Service: Endoscopy;  Laterality: N/A;  IDDM   ESOPHAGOGASTRODUODENOSCOPY N/A 10/14/2023   Procedure: EGD (ESOPHAGOGASTRODUODENOSCOPY);  Surgeon: Maryruth Ole DASEN, MD;  Location: [facility] ENDOSCOPY;  Service: Endoscopy;  Laterality: N/A;   JOINT REPLACEMENT Left    SHOULDER   LEFT HEART CATH AND CORONARY ANGIOGRAPHY Left 04/04/2018   Procedure: LEFT HEART CATH AND CORONARY ANGIOGRAPHY;  Surgeon: Hester Wolm PARAS, MD;  Location: [facility];  Service: Cardiovascular;  Laterality: Left;   SHOULDER SURGERY Right 11/20/2018   Procedure: R SHOULDER ARTHROSCOPY: biceps tenomyotomy, distal claviculectomy, acromioplasty, posterior capsule release, debridement, decrompression of subacromial space, coracoacromial release, bursectomy   SMALL INTESTINE SURGERY  2012   THYROID  SURGERY  1992   TONSILLECTOMY      OB History     Gravida  6   Para  4   Term       Preterm      AB  2   Living  4      SAB  2   IAB      Ectopic      Multiple      Live Births           Obstetric Comments  1st Menstrual Cycle:  14  1st Pregnancy: 19          Home Medications    Prior to Admission medications   Medication Sig Start Date End Date Taking? Authorizing Provider  acetaminophen  (TYLENOL ) 500 MG tablet Take 1,000 mg by mouth every 8 (eight) hours as needed for mild pain or moderate pain.   Yes [provider]  adalimumab (HUMIRA, 2 PEN,) 40 MG/0.4ML pen Inject 40 mg into the skin every 14 (fourteen) days.   Yes [provider]  Ascorbic Acid (VITAMIN C) 1000 MG tablet Take 1,000 mg by mouth 3 (three) times a week.   Yes [provider]  budesonide  (ENTOCORT EC ) 3 MG 24 hr capsule Take 9 mg by mouth daily. 08/01/21  Yes [provider]  Calcium  Carb-Cholecalciferol 600-10 MG-MCG TABS TAKE 1 TABLET BY MOUTH WITH BREAKFST  Yes [provider]  CINNAMON PO Take 1,200 mg by mouth daily.   Yes [provider]  cyanocobalamin  (VITAMIN B12) 1000 MCG tablet Take 1,000 mcg by mouth daily.   Yes [provider]  diclofenac Sodium (VOLTAREN) 1 % GEL Apply 2 g topically. 04/29/17  Yes [provider]  doxycycline  (VIBRAMYCIN ) 100 MG capsule Take 1 capsule (100 mg total) by mouth 2 (two) times daily for 7 days. 11/29/23 12/06/23 Yes Tecora Eustache, Rilla, NP  fluticasone  (FLONASE ) 50 MCG/ACT nasal spray Place 2 sprays into the nose. 12/18/22 12/18/23 Yes [provider]  folic acid  (FOLVITE ) 1 MG tablet Take 1 mg by mouth. 06/11/22  Yes [provider]  glucose blood (ONETOUCH ULTRA TEST) test strip USE AS DIRECTED 3 TIMES DAILY 04/24/23  Yes [provider]  hydrocortisone (PROCTOZONE-HC) 2.5 % rectal cream Place rectally. 01/21/16  Yes [provider]  Insulin  Lispro w/ Trans Port 100 UNIT/ML SOPN  05/24/20  Yes [provider]  Insulin  Pen Needle  (ULTICARE MICRO PEN NEEDLES) 32G X 4 MM MISC 1 each by Other route. 09/28/22  Yes [provider]  Leflunomide (ARAVA PO)    Yes [provider]  magnesium  oxide (MAG-OX) 400 MG tablet Take 400 mg by mouth daily.   Yes [provider]  mupirocin ointment (BACTROBAN) 2 % Apply 1 Application topically 2 (two) times daily for 7 days. Left 3rd toe 11/29/23 12/06/23 Yes Tanna Loeffler, Rilla, NP  Na Sulfate-K Sulfate-Mg Sulfate concentrate (SUPREP) 17.5-3.13-1.6 GM/177ML SOLN Take 1 Bottle by mouth. 08/30/23  Yes [provider]  neomycin-polymyxin b -dexamethasone (MAXITROL) 3.5-10000-0.1 SUSP SMARTSIG:In Eye(s) 06/03/23  Yes [provider]  ondansetron  (ZOFRAN -ODT) 4 MG disintegrating tablet Take by mouth. 08/30/21  Yes [provider]  PREVALITE  4 GM/DOSE powder Take 4 g by mouth. 07/12/23  Yes [provider]  SYNTHROID  100 MCG tablet Take 100 mcg by mouth daily before breakfast. Take 6 days per week. 04/27/14  Yes [provider]  THERATEARS 0.25 % SOLN Place 1 drop into both eyes 3 (three) times daily as needed (dry/irritated eyes.).   Yes [provider]  thyroid  (ARMOUR) 30 MG tablet Take 30 mg by mouth daily before breakfast.    Yes [provider]  tiZANidine (ZANAFLEX) 2 MG tablet  06/11/22  Yes [provider]  Turmeric 500 MG CAPS Take 500 mg by mouth daily.   Yes [provider]  Vitamin D , Ergocalciferol , (DRISDOL) 1.25 MG (50000 UNIT) CAPS capsule Take 50,000 Units by mouth. 05/02/23  Yes [provider]  phenazopyridine  (PYRIDIUM ) 200 MG tablet Take 1 tablet (200 mg total) by mouth 3 (three) times daily. 06/01/23   Bernardino Ditch, NP  Potassium Chloride  40 MEQ/15ML (20%) SOLN Take 15 ml by mouth once a week. 09/07/21 06/01/23  [provider]  rifaximin  (XIFAXAN ) 550 MG TABS tablet     [provider]    Family History Family History  Problem Relation Age of Onset    Breast cancer Sister 30   Crohn's disease Mother    Heart attack Father 51    Social History Social History   Tobacco Use   Smoking status: Former    Current packs/day: 0.00    Average packs/day: 1 pack/day for 12.0 years (12.0 ttl pk-yrs)    Types: Cigarettes    Start date: 10/19/1957    Quit date: 10/19/1969    Years since quitting: 54.1   Smokeless tobacco: Never  Vaping Use  Vaping status: Never Used  Substance Use Topics   Alcohol  use: Not Currently   Drug use: No     Allergies   Erythromycin, Hydromorphone, Upadacitinib , Hydroxyprogesterone, Pholcodine, Cefuroxime , Ciprocinonide [fluocinolone], Codeine, Floxin [ofloxacin], Keflex [cephalexin], Levofloxacin, Sulfa antibiotics, and Ciprofloxacin   Review of Systems Review of Systems  Constitutional:  Negative for fever.  Skin:  Positive for color change.  All other systems reviewed and are negative.    Physical Exam Triage Vital Signs ED Triage Vitals  Encounter Vitals Group     BP      Girls Systolic BP Percentile      Girls Diastolic BP Percentile      Boys Systolic BP Percentile      Boys Diastolic BP Percentile      Pulse      Resp      Temp      Temp src      SpO2      Weight      Height      Head Circumference      Peak Flow      Pain Score      Pain Loc      Pain Education      Exclude from Growth Chart    No data found.  Updated Vital Signs BP (!) 149/75 (BP Location: Left Arm)   Pulse 85   Temp (!) 97.5 F (36.4 C) (Oral)   Wt 157 lb (71.2 kg)   SpO2 95%   BMI 23.87 kg/m   Visual Acuity Right Eye Distance:   Left Eye Distance:   Bilateral Distance:    Right Eye Near:   Left Eye Near:    Bilateral Near:     Physical Exam Vitals and nursing note reviewed.  Constitutional:      Appearance: Normal appearance. She is well-developed and well-groomed.  Cardiovascular:     Rate and Rhythm: Normal rate.     Pulses:          Dorsalis pedis pulses are 2+ on the left side.   Pulmonary:     Effort: Pulmonary effort is normal.  Musculoskeletal:       Feet:  Neurological:     General: No focal deficit present.     Mental Status: She is alert and oriented to person, place, and time.     GCS: GCS eye subscore is 4. GCS verbal subscore is 5. GCS motor subscore is 6.  Psychiatric:        Attention and Perception: Attention normal.        Mood and Affect: Mood normal.        Speech: Speech normal.        Behavior: Behavior normal. Behavior is cooperative.      UC Treatments / Results  Labs (all labs ordered are listed, but only abnormal results are displayed) Labs Reviewed - No data to display  EKG   Radiology No results found.  Procedures Procedures (including critical care time)  Medications Ordered in UC Medications - No data to display  Initial Impression / Assessment and Plan / UC Course  I have reviewed the triage vital signs and the nursing notes.  Pertinent labs & imaging results that were available during my care of the patient were reviewed by me and considered in my medical decision making (see chart for details).    Discussed exam findings and plan of care with patient, doxycyline and mupirocin scripted, strict go to ER precautions given.  Patient verbalized understanding to this provider.  Ddx: Cellulitis, ingrown toenail, pain of left toe Final Clinical Impressions(s) / UC Diagnoses   Final diagnoses:  Pain in left toe(s)  Cellulitis of toe of left foot     Discharge Instructions      Take doxycycline  as prescribed Mupirocin ointment Daily wound care Follow up with PCP in 3 days, sooner if worse     ED Prescriptions     Medication Sig Dispense Auth. Provider   mupirocin ointment (BACTROBAN) 2 % Apply 1 Application topically 2 (two) times daily for 7 days. Left 3rd toe 15 g Petrina Melby, NP   doxycycline  (VIBRAMYCIN ) 100 MG capsule Take 1 capsule (100 mg total) by mouth 2 (two) times daily for 7 days. 14  capsule Maurita Havener, NP      PDMP not reviewed this encounter.   Aminta Loose, NP 11/29/23 1524

## 2023-11-29 NOTE — Discharge Instructions (Addendum)
 Take doxycycline  as prescribed Mupirocin ointment Daily wound care Follow up with PCP in 3 days, sooner if worse

## 2023-12-10 ENCOUNTER — Other Ambulatory Visit (HOSPITAL_COMMUNITY): Payer: Self-pay | Admitting: Pharmacy Technician

## 2023-12-11 ENCOUNTER — Ambulatory Visit
Admission: RE | Admit: 2023-12-11 | Discharge: 2023-12-11 | Disposition: A | Discharge status: Home / Self Care | Source: Ambulatory Visit | Attending: Gastroenterology | Admitting: Gastroenterology

## 2023-12-11 MED ORDER — MAGNESIUM SULFATE 4 GM/100ML IV SOLN
4.0000 g | Freq: Once | INTRAVENOUS | Status: AC
  Administered 2023-12-11: 4 g via INTRAVENOUS
  Filled 2023-12-11 (×2): qty 100

## 2023-12-31 ENCOUNTER — Ambulatory Visit
Admission: RE | Admit: 2023-12-31 | Discharge: 2023-12-31 | Disposition: A | Discharge status: Home / Self Care | Source: Ambulatory Visit | Attending: Gastroenterology | Admitting: Gastroenterology

## 2023-12-31 ENCOUNTER — Ambulatory Visit

## 2023-12-31 MED ORDER — MAGNESIUM HYDROXIDE 400 MG/5ML PO SUSP
ORAL | Status: AC
  Filled 2023-12-31: qty 30

## 2023-12-31 MED ORDER — MAGNESIUM SULFATE 4 GM/100ML IV SOLN
4.0000 g | Freq: Once | INTRAVENOUS | Status: AC
  Administered 2023-12-31: 4 g via INTRAVENOUS
  Filled 2023-12-31: qty 100

## 2024-01-20 ENCOUNTER — Ambulatory Visit
Admission: RE | Admit: 2024-01-20 | Discharge: 2024-01-20 | Disposition: A | Source: Ambulatory Visit | Attending: Gastroenterology

## 2024-01-20 MED ORDER — MAGNESIUM SULFATE 4 GM/100ML IV SOLN
4.0000 g | Freq: Once | INTRAVENOUS | Status: AC
  Administered 2024-01-20: 4 g via INTRAVENOUS
  Filled 2024-01-20: qty 100

## 2024-03-02 ENCOUNTER — Ambulatory Visit
Admission: RE | Admit: 2024-03-02 | Discharge: 2024-03-02 | Disposition: A | Source: Ambulatory Visit | Attending: Gastroenterology

## 2024-03-02 MED ORDER — MAGNESIUM SULFATE 4 GM/100ML IV SOLN
4.0000 g | INTRAVENOUS | Status: DC
  Administered 2024-03-02: 4 g via INTRAVENOUS
  Filled 2024-03-02: qty 100
